# Patient Record
Sex: Male | Born: 1955
Health system: Southern US, Community
[De-identification: ages and names within clinical notes are randomized; demographics above are authoritative.]

## PROBLEM LIST (undated history)

## (undated) DIAGNOSIS — E785 Hyperlipidemia, unspecified: Secondary | ICD-10-CM

## (undated) DIAGNOSIS — I639 Cerebral infarction, unspecified: Secondary | ICD-10-CM

## (undated) DIAGNOSIS — J9602 Acute respiratory failure with hypercapnia: Secondary | ICD-10-CM

## (undated) DIAGNOSIS — E039 Hypothyroidism, unspecified: Secondary | ICD-10-CM

## (undated) DIAGNOSIS — F32A Depression, unspecified: Secondary | ICD-10-CM

## (undated) DIAGNOSIS — R1310 Dysphagia, unspecified: Secondary | ICD-10-CM

## (undated) DIAGNOSIS — Z95828 Presence of other vascular implants and grafts: Secondary | ICD-10-CM

## (undated) DIAGNOSIS — G9341 Metabolic encephalopathy: Secondary | ICD-10-CM

## (undated) DIAGNOSIS — N189 Chronic kidney disease, unspecified: Secondary | ICD-10-CM

## (undated) DIAGNOSIS — Z992 Dependence on renal dialysis: Secondary | ICD-10-CM

## (undated) DIAGNOSIS — K219 Gastro-esophageal reflux disease without esophagitis: Secondary | ICD-10-CM

## (undated) DIAGNOSIS — E079 Disorder of thyroid, unspecified: Secondary | ICD-10-CM

## (undated) DIAGNOSIS — D638 Anemia in other chronic diseases classified elsewhere: Secondary | ICD-10-CM

## (undated) DIAGNOSIS — K5732 Diverticulitis of large intestine without perforation or abscess without bleeding: Secondary | ICD-10-CM

## (undated) DIAGNOSIS — N186 End stage renal disease: Secondary | ICD-10-CM

## (undated) DIAGNOSIS — M5410 Radiculopathy, site unspecified: Secondary | ICD-10-CM

## (undated) DIAGNOSIS — E1122 Type 2 diabetes mellitus with diabetic chronic kidney disease: Secondary | ICD-10-CM

## (undated) DIAGNOSIS — I10 Essential (primary) hypertension: Secondary | ICD-10-CM

## (undated) DIAGNOSIS — E119 Type 2 diabetes mellitus without complications: Secondary | ICD-10-CM

## (undated) DIAGNOSIS — F419 Anxiety disorder, unspecified: Secondary | ICD-10-CM

## (undated) DIAGNOSIS — Z932 Ileostomy status: Secondary | ICD-10-CM

## (undated) DIAGNOSIS — Z86718 Personal history of other venous thrombosis and embolism: Secondary | ICD-10-CM

## (undated) HISTORY — DX: Personal history of other venous thrombosis and embolism: Z86.718

## (undated) HISTORY — DX: Radiculopathy, site unspecified: M54.10

## (undated) HISTORY — DX: Essential (primary) hypertension: I10

## (undated) HISTORY — DX: Presence of other vascular implants and grafts: Z95.828

## (undated) HISTORY — DX: Dependence on renal dialysis: Z99.2

## (undated) HISTORY — DX: Dysphagia, unspecified: R13.10

## (undated) HISTORY — DX: Ileostomy status: Z93.2

## (undated) HISTORY — DX: Cerebral infarction, unspecified: I63.9

## (undated) HISTORY — DX: End stage renal disease: N18.6

## (undated) HISTORY — PX: DIALYSIS/PERMA CATHETER REMOVAL: CATH118289

## (undated) HISTORY — PX: LAPAROSCOPIC INSERTION PERITONEAL CATHETER: SUR773

## (undated) HISTORY — DX: Anemia in other chronic diseases classified elsewhere: D63.8

## (undated) HISTORY — PX: EYE SURGERY: SHX253

## (undated) HISTORY — DX: Metabolic encephalopathy: G93.41

## (undated) HISTORY — PX: EXPLORATORY LAPAROTOMY: SUR591

## (undated) HISTORY — DX: Type 2 diabetes mellitus with diabetic chronic kidney disease: E11.22

## (undated) HISTORY — DX: Acute respiratory failure with hypercapnia: J96.02

---

## 2005-07-17 ENCOUNTER — Ambulatory Visit (HOSPITAL_COMMUNITY): Admission: RE | Admit: 2005-07-17 | Discharge: 2005-07-17 | Payer: Self-pay | Admitting: Ophthalmology

## 2005-10-15 ENCOUNTER — Ambulatory Visit (HOSPITAL_COMMUNITY): Admission: RE | Admit: 2005-10-15 | Discharge: 2005-10-15 | Payer: Self-pay | Admitting: Ophthalmology

## 2005-10-24 ENCOUNTER — Ambulatory Visit (HOSPITAL_COMMUNITY): Admission: RE | Admit: 2005-10-24 | Discharge: 2005-10-24 | Payer: Self-pay | Admitting: Ophthalmology

## 2005-10-27 ENCOUNTER — Emergency Department (HOSPITAL_COMMUNITY): Admission: EM | Admit: 2005-10-27 | Discharge: 2005-10-27 | Payer: Self-pay | Admitting: Emergency Medicine

## 2005-10-29 ENCOUNTER — Ambulatory Visit (HOSPITAL_COMMUNITY): Admission: RE | Admit: 2005-10-29 | Discharge: 2005-10-29 | Payer: Self-pay | Admitting: Ophthalmology

## 2006-01-30 ENCOUNTER — Ambulatory Visit (HOSPITAL_COMMUNITY): Admission: RE | Admit: 2006-01-30 | Discharge: 2006-01-30 | Payer: Self-pay | Admitting: Ophthalmology

## 2006-03-11 ENCOUNTER — Ambulatory Visit (HOSPITAL_COMMUNITY): Admission: RE | Admit: 2006-03-11 | Discharge: 2006-03-11 | Payer: Self-pay | Admitting: Ophthalmology

## 2007-05-09 IMAGING — CR DG CHEST 2V
2 series · 2 of 2 positions shown · non-contrast
Comparison: None available.

CLINICAL DATA: Preoperative respiratory exam for vitreal hemorrhage, cataract and diabetic retinal disease.  
 CHEST - 2 VIEW:

[view not recorded (1 of 2)]
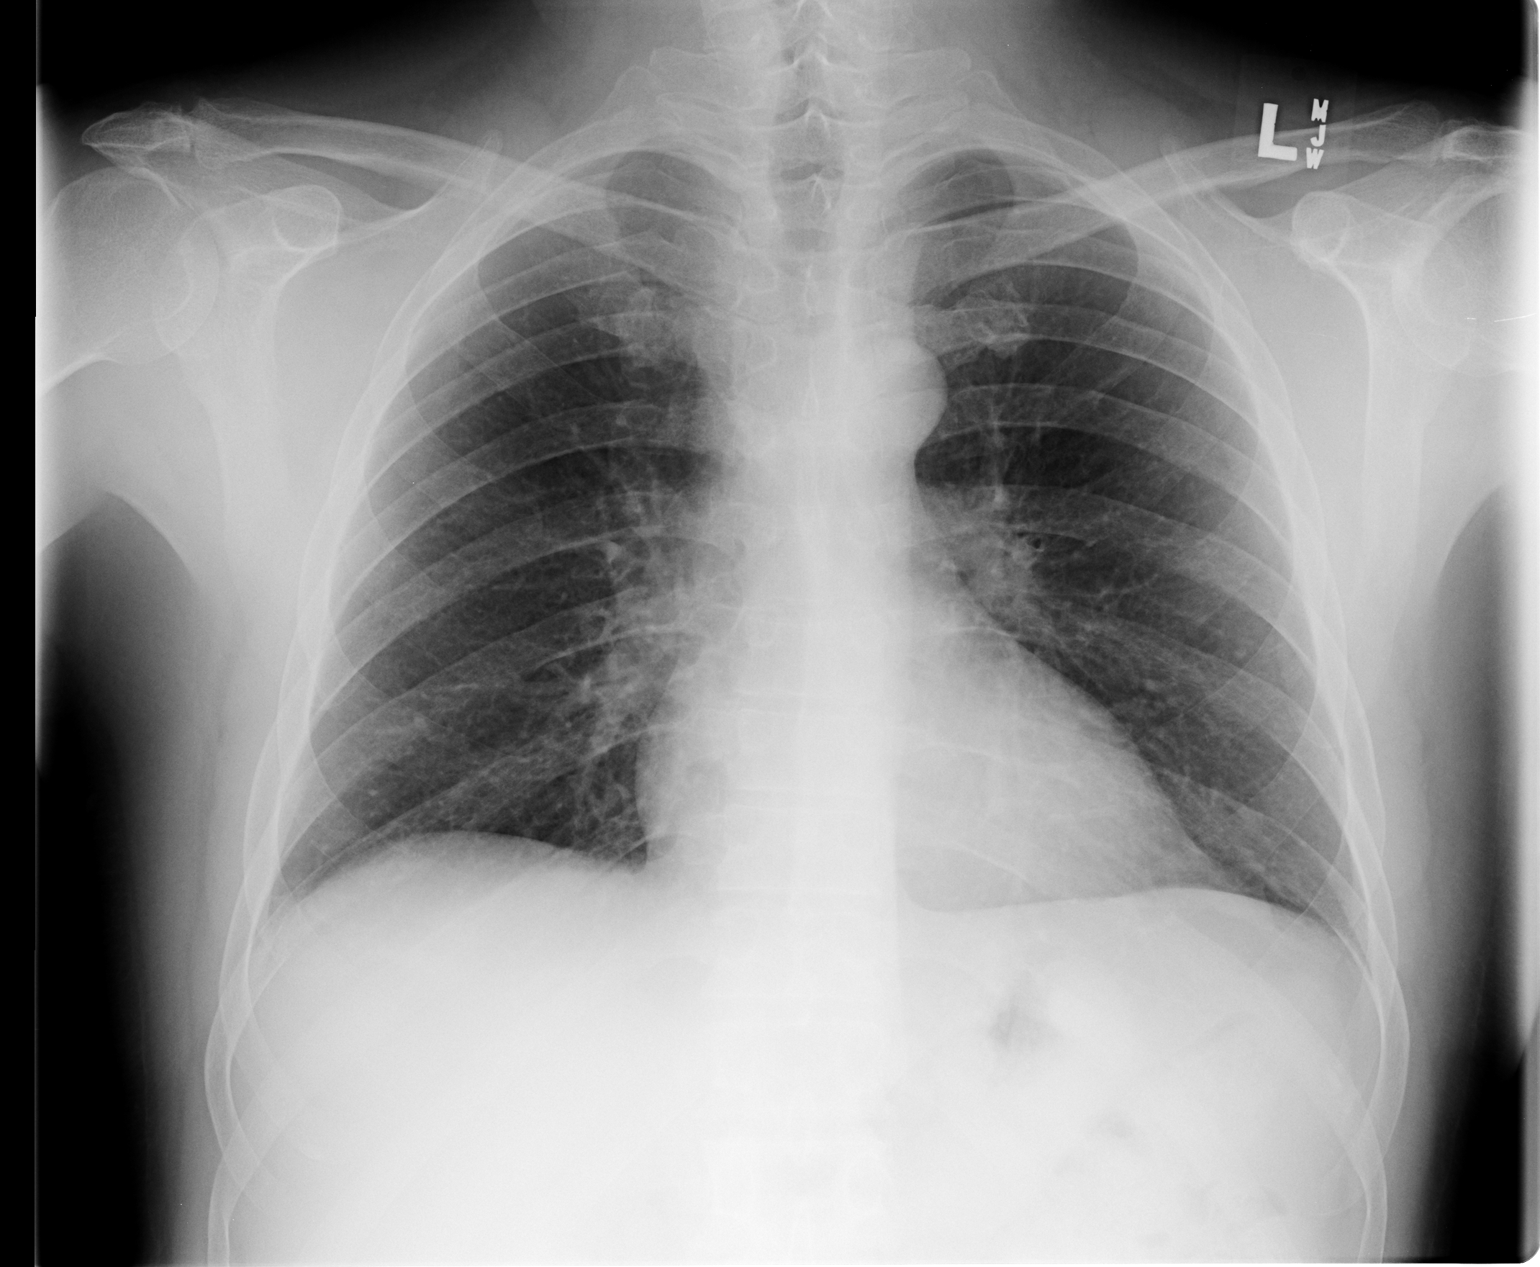

[view not recorded (2 of 2)]
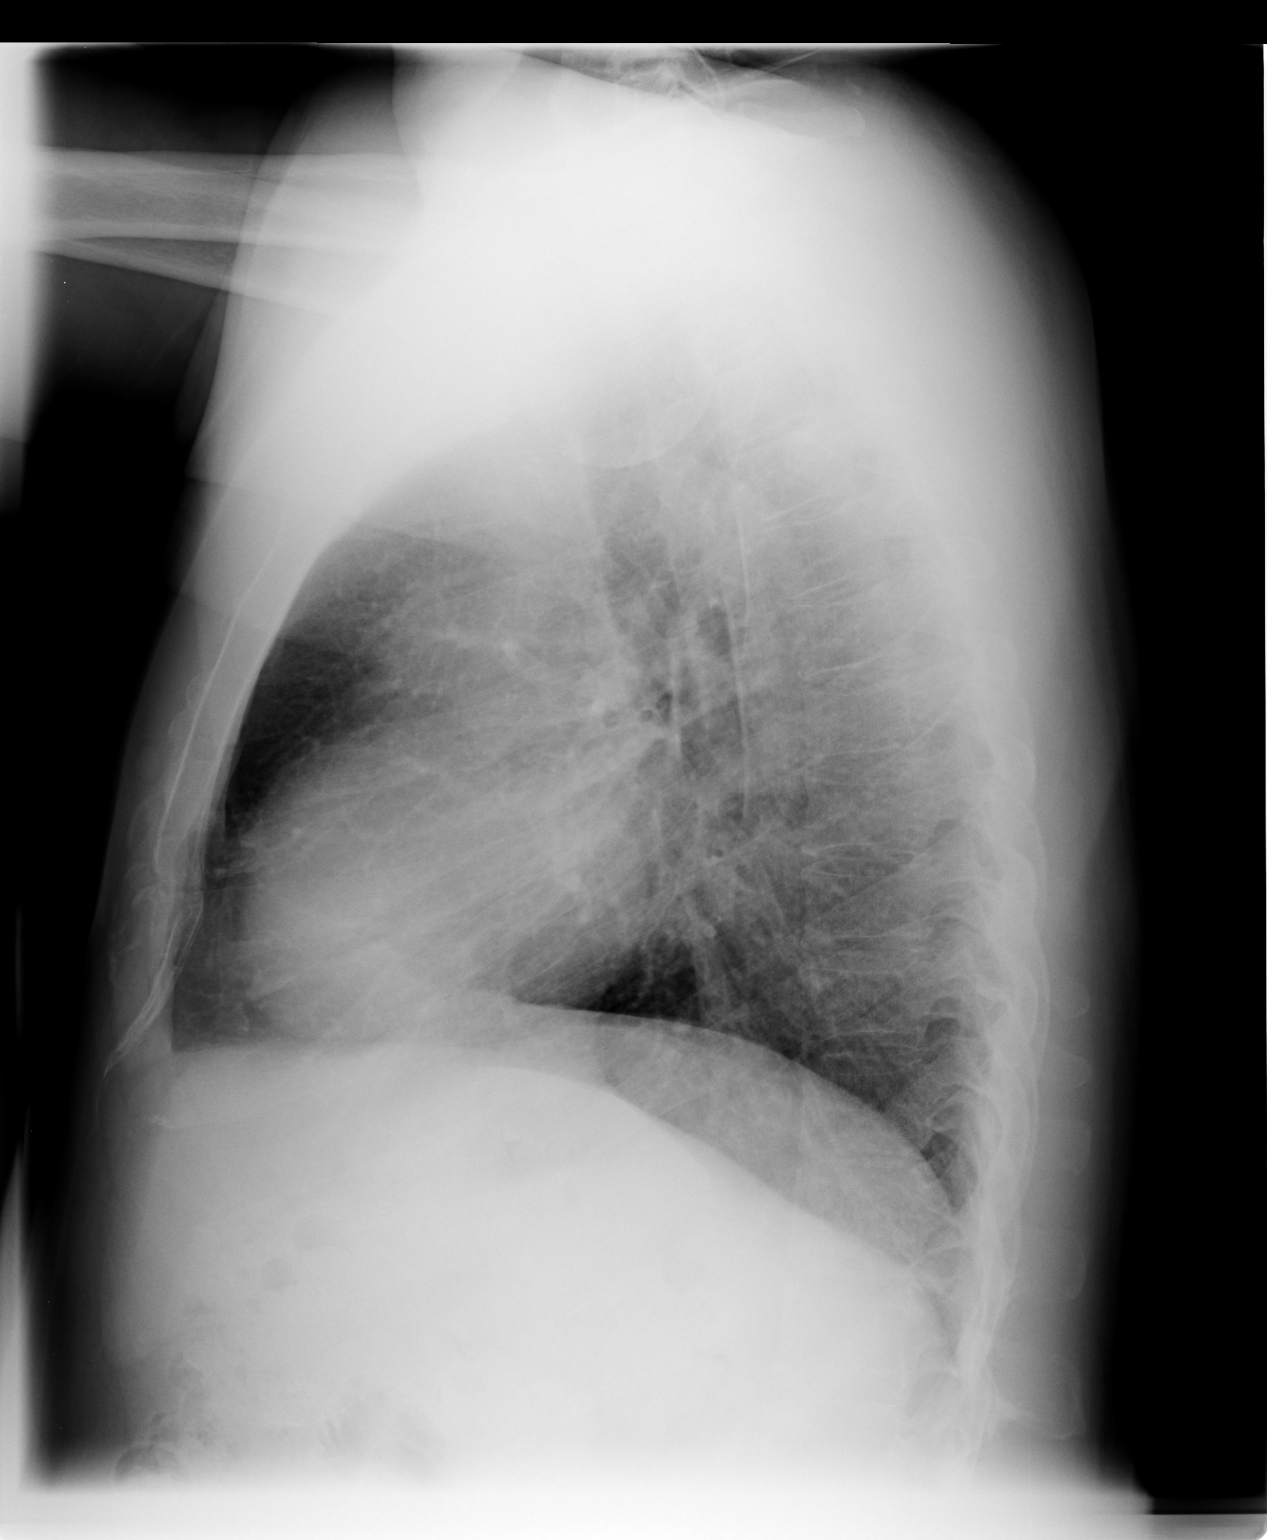

[2 of 2 positions shown; findings below may reference images not displayed]

FINDINGS: The heart size and mediastinal contours are within normal limits.  Both lungs are clear.  The visualized skeletal structures are unremarkable.
IMPRESSION: No active cardiopulmonary disease.

## 2015-07-12 DIAGNOSIS — Z01818 Encounter for other preprocedural examination: Secondary | ICD-10-CM | POA: Diagnosis not present

## 2015-07-12 DIAGNOSIS — N183 Chronic kidney disease, stage 3 (moderate): Secondary | ICD-10-CM | POA: Diagnosis not present

## 2015-07-15 DIAGNOSIS — Z794 Long term (current) use of insulin: Secondary | ICD-10-CM | POA: Diagnosis not present

## 2015-07-15 DIAGNOSIS — E8770 Fluid overload, unspecified: Secondary | ICD-10-CM | POA: Diagnosis not present

## 2015-07-15 DIAGNOSIS — I509 Heart failure, unspecified: Secondary | ICD-10-CM | POA: Diagnosis not present

## 2015-07-15 DIAGNOSIS — E7800 Pure hypercholesterolemia, unspecified: Secondary | ICD-10-CM | POA: Diagnosis not present

## 2015-07-15 DIAGNOSIS — Z87891 Personal history of nicotine dependence: Secondary | ICD-10-CM | POA: Diagnosis not present

## 2015-07-15 DIAGNOSIS — N186 End stage renal disease: Secondary | ICD-10-CM | POA: Diagnosis not present

## 2015-07-15 DIAGNOSIS — I120 Hypertensive chronic kidney disease with stage 5 chronic kidney disease or end stage renal disease: Secondary | ICD-10-CM | POA: Diagnosis not present

## 2015-07-15 DIAGNOSIS — Z79899 Other long term (current) drug therapy: Secondary | ICD-10-CM | POA: Diagnosis not present

## 2015-07-15 DIAGNOSIS — Z833 Family history of diabetes mellitus: Secondary | ICD-10-CM | POA: Diagnosis not present

## 2015-07-15 DIAGNOSIS — E1122 Type 2 diabetes mellitus with diabetic chronic kidney disease: Secondary | ICD-10-CM | POA: Diagnosis not present

## 2015-07-20 DIAGNOSIS — N186 End stage renal disease: Secondary | ICD-10-CM | POA: Diagnosis not present

## 2015-07-20 DIAGNOSIS — D649 Anemia, unspecified: Secondary | ICD-10-CM | POA: Diagnosis not present

## 2015-07-20 DIAGNOSIS — R75 Inconclusive laboratory evidence of human immunodeficiency virus [HIV]: Secondary | ICD-10-CM | POA: Diagnosis not present

## 2015-08-24 DIAGNOSIS — N186 End stage renal disease: Secondary | ICD-10-CM | POA: Diagnosis not present

## 2015-08-24 DIAGNOSIS — Z992 Dependence on renal dialysis: Secondary | ICD-10-CM | POA: Diagnosis not present

## 2015-09-23 DIAGNOSIS — Z992 Dependence on renal dialysis: Secondary | ICD-10-CM | POA: Diagnosis not present

## 2015-09-23 DIAGNOSIS — N186 End stage renal disease: Secondary | ICD-10-CM | POA: Diagnosis not present

## 2015-10-12 DIAGNOSIS — R079 Chest pain, unspecified: Secondary | ICD-10-CM | POA: Insufficient documentation

## 2015-10-24 DIAGNOSIS — N186 End stage renal disease: Secondary | ICD-10-CM | POA: Diagnosis not present

## 2015-10-24 DIAGNOSIS — Z992 Dependence on renal dialysis: Secondary | ICD-10-CM | POA: Diagnosis not present

## 2015-11-10 DIAGNOSIS — I10 Essential (primary) hypertension: Secondary | ICD-10-CM | POA: Diagnosis not present

## 2015-11-10 DIAGNOSIS — R079 Chest pain, unspecified: Secondary | ICD-10-CM | POA: Diagnosis not present

## 2015-11-23 DIAGNOSIS — R079 Chest pain, unspecified: Secondary | ICD-10-CM | POA: Diagnosis not present

## 2015-11-23 DIAGNOSIS — Z6828 Body mass index (BMI) 28.0-28.9, adult: Secondary | ICD-10-CM | POA: Diagnosis not present

## 2015-11-23 DIAGNOSIS — N186 End stage renal disease: Secondary | ICD-10-CM | POA: Diagnosis not present

## 2015-11-24 DIAGNOSIS — N186 End stage renal disease: Secondary | ICD-10-CM | POA: Diagnosis not present

## 2015-11-24 DIAGNOSIS — Z992 Dependence on renal dialysis: Secondary | ICD-10-CM | POA: Diagnosis not present

## 2015-12-01 DIAGNOSIS — E119 Type 2 diabetes mellitus without complications: Secondary | ICD-10-CM | POA: Diagnosis not present

## 2015-12-24 DIAGNOSIS — N186 End stage renal disease: Secondary | ICD-10-CM | POA: Diagnosis not present

## 2015-12-24 DIAGNOSIS — Z992 Dependence on renal dialysis: Secondary | ICD-10-CM | POA: Diagnosis not present

## 2015-12-29 DIAGNOSIS — E119 Type 2 diabetes mellitus without complications: Secondary | ICD-10-CM | POA: Diagnosis not present

## 2016-01-20 DIAGNOSIS — N186 End stage renal disease: Secondary | ICD-10-CM | POA: Diagnosis not present

## 2016-02-23 DIAGNOSIS — N186 End stage renal disease: Secondary | ICD-10-CM | POA: Diagnosis not present

## 2016-02-23 DIAGNOSIS — Z992 Dependence on renal dialysis: Secondary | ICD-10-CM | POA: Diagnosis not present

## 2016-03-07 DIAGNOSIS — M84372A Stress fracture, left ankle, initial encounter for fracture: Secondary | ICD-10-CM | POA: Diagnosis not present

## 2016-03-08 DIAGNOSIS — S92001A Unspecified fracture of right calcaneus, initial encounter for closed fracture: Secondary | ICD-10-CM | POA: Diagnosis not present

## 2016-03-15 DIAGNOSIS — S91001A Unspecified open wound, right ankle, initial encounter: Secondary | ICD-10-CM | POA: Diagnosis not present

## 2016-03-15 DIAGNOSIS — S92001A Unspecified fracture of right calcaneus, initial encounter for closed fracture: Secondary | ICD-10-CM | POA: Diagnosis not present

## 2016-03-25 DIAGNOSIS — N186 End stage renal disease: Secondary | ICD-10-CM | POA: Diagnosis not present

## 2016-03-25 DIAGNOSIS — Z992 Dependence on renal dialysis: Secondary | ICD-10-CM | POA: Diagnosis not present

## 2016-04-06 DIAGNOSIS — N186 End stage renal disease: Secondary | ICD-10-CM | POA: Diagnosis not present

## 2016-04-06 DIAGNOSIS — D631 Anemia in chronic kidney disease: Secondary | ICD-10-CM | POA: Diagnosis not present

## 2016-04-06 DIAGNOSIS — Z1159 Encounter for screening for other viral diseases: Secondary | ICD-10-CM | POA: Diagnosis not present

## 2016-04-06 DIAGNOSIS — E119 Type 2 diabetes mellitus without complications: Secondary | ICD-10-CM | POA: Diagnosis not present

## 2016-04-06 DIAGNOSIS — Z125 Encounter for screening for malignant neoplasm of prostate: Secondary | ICD-10-CM | POA: Diagnosis not present

## 2016-04-25 DIAGNOSIS — N186 End stage renal disease: Secondary | ICD-10-CM | POA: Diagnosis not present

## 2016-04-25 DIAGNOSIS — Z992 Dependence on renal dialysis: Secondary | ICD-10-CM | POA: Diagnosis not present

## 2016-04-27 DIAGNOSIS — H3581 Retinal edema: Secondary | ICD-10-CM | POA: Diagnosis not present

## 2016-05-23 DIAGNOSIS — N186 End stage renal disease: Secondary | ICD-10-CM | POA: Diagnosis not present

## 2016-05-23 DIAGNOSIS — Z992 Dependence on renal dialysis: Secondary | ICD-10-CM | POA: Diagnosis not present

## 2016-05-28 DIAGNOSIS — S92001A Unspecified fracture of right calcaneus, initial encounter for closed fracture: Secondary | ICD-10-CM | POA: Diagnosis not present

## 2016-06-23 DIAGNOSIS — N186 End stage renal disease: Secondary | ICD-10-CM | POA: Diagnosis not present

## 2016-06-23 DIAGNOSIS — Z992 Dependence on renal dialysis: Secondary | ICD-10-CM | POA: Diagnosis not present

## 2016-07-12 DIAGNOSIS — S92001A Unspecified fracture of right calcaneus, initial encounter for closed fracture: Secondary | ICD-10-CM | POA: Diagnosis not present

## 2016-07-23 DIAGNOSIS — Z992 Dependence on renal dialysis: Secondary | ICD-10-CM | POA: Diagnosis not present

## 2016-07-23 DIAGNOSIS — N186 End stage renal disease: Secondary | ICD-10-CM | POA: Diagnosis not present

## 2016-07-27 DIAGNOSIS — K5730 Diverticulosis of large intestine without perforation or abscess without bleeding: Secondary | ICD-10-CM | POA: Diagnosis not present

## 2016-07-27 DIAGNOSIS — Z1211 Encounter for screening for malignant neoplasm of colon: Secondary | ICD-10-CM | POA: Diagnosis not present

## 2016-07-27 DIAGNOSIS — K648 Other hemorrhoids: Secondary | ICD-10-CM | POA: Diagnosis not present

## 2016-07-27 DIAGNOSIS — D369 Benign neoplasm, unspecified site: Secondary | ICD-10-CM | POA: Diagnosis not present

## 2016-07-27 DIAGNOSIS — D123 Benign neoplasm of transverse colon: Secondary | ICD-10-CM | POA: Diagnosis not present

## 2016-07-27 DIAGNOSIS — R7309 Other abnormal glucose: Secondary | ICD-10-CM | POA: Diagnosis not present

## 2016-08-02 DIAGNOSIS — L308 Other specified dermatitis: Secondary | ICD-10-CM | POA: Diagnosis not present

## 2016-08-02 DIAGNOSIS — D485 Neoplasm of uncertain behavior of skin: Secondary | ICD-10-CM | POA: Diagnosis not present

## 2016-08-02 DIAGNOSIS — L309 Dermatitis, unspecified: Secondary | ICD-10-CM | POA: Diagnosis not present

## 2016-08-23 DIAGNOSIS — Z992 Dependence on renal dialysis: Secondary | ICD-10-CM | POA: Diagnosis not present

## 2016-08-23 DIAGNOSIS — N186 End stage renal disease: Secondary | ICD-10-CM | POA: Diagnosis not present

## 2016-12-28 DIAGNOSIS — I2510 Atherosclerotic heart disease of native coronary artery without angina pectoris: Secondary | ICD-10-CM | POA: Diagnosis not present

## 2016-12-28 DIAGNOSIS — Z01810 Encounter for preprocedural cardiovascular examination: Secondary | ICD-10-CM | POA: Diagnosis not present

## 2017-01-25 DIAGNOSIS — Z992 Dependence on renal dialysis: Secondary | ICD-10-CM | POA: Diagnosis not present

## 2017-02-08 DIAGNOSIS — K5730 Diverticulosis of large intestine without perforation or abscess without bleeding: Secondary | ICD-10-CM | POA: Diagnosis not present

## 2017-02-08 DIAGNOSIS — Z86010 Personal history of colonic polyps: Secondary | ICD-10-CM | POA: Diagnosis not present

## 2017-02-08 DIAGNOSIS — Z1211 Encounter for screening for malignant neoplasm of colon: Secondary | ICD-10-CM | POA: Diagnosis not present

## 2017-02-08 DIAGNOSIS — R7309 Other abnormal glucose: Secondary | ICD-10-CM | POA: Diagnosis not present

## 2017-02-08 DIAGNOSIS — K648 Other hemorrhoids: Secondary | ICD-10-CM | POA: Diagnosis not present

## 2017-04-18 DIAGNOSIS — Z91148 Patient's other noncompliance with medication regimen for other reason: Secondary | ICD-10-CM | POA: Insufficient documentation

## 2017-04-18 DIAGNOSIS — E782 Mixed hyperlipidemia: Secondary | ICD-10-CM | POA: Insufficient documentation

## 2017-04-18 DIAGNOSIS — Z794 Long term (current) use of insulin: Secondary | ICD-10-CM | POA: Insufficient documentation

## 2017-04-18 DIAGNOSIS — Z758 Other problems related to medical facilities and other health care: Secondary | ICD-10-CM | POA: Insufficient documentation

## 2017-04-18 DIAGNOSIS — N186 End stage renal disease: Secondary | ICD-10-CM | POA: Insufficient documentation

## 2017-04-18 DIAGNOSIS — I10 Essential (primary) hypertension: Secondary | ICD-10-CM | POA: Insufficient documentation

## 2017-04-22 DIAGNOSIS — R609 Edema, unspecified: Secondary | ICD-10-CM | POA: Diagnosis not present

## 2017-04-22 DIAGNOSIS — N186 End stage renal disease: Secondary | ICD-10-CM | POA: Diagnosis not present

## 2017-04-22 DIAGNOSIS — I10 Essential (primary) hypertension: Secondary | ICD-10-CM | POA: Diagnosis not present

## 2017-04-24 DIAGNOSIS — N189 Chronic kidney disease, unspecified: Secondary | ICD-10-CM | POA: Insufficient documentation

## 2017-05-14 DIAGNOSIS — N186 End stage renal disease: Secondary | ICD-10-CM | POA: Diagnosis not present

## 2017-05-15 DIAGNOSIS — N186 End stage renal disease: Secondary | ICD-10-CM | POA: Diagnosis not present

## 2017-05-16 DIAGNOSIS — N186 End stage renal disease: Secondary | ICD-10-CM | POA: Diagnosis not present

## 2017-05-17 DIAGNOSIS — N186 End stage renal disease: Secondary | ICD-10-CM | POA: Diagnosis not present

## 2017-05-18 DIAGNOSIS — N186 End stage renal disease: Secondary | ICD-10-CM | POA: Diagnosis not present

## 2017-05-19 DIAGNOSIS — N186 End stage renal disease: Secondary | ICD-10-CM | POA: Diagnosis not present

## 2017-05-20 DIAGNOSIS — N186 End stage renal disease: Secondary | ICD-10-CM | POA: Diagnosis not present

## 2017-05-21 DIAGNOSIS — N186 End stage renal disease: Secondary | ICD-10-CM | POA: Diagnosis not present

## 2017-05-22 DIAGNOSIS — N186 End stage renal disease: Secondary | ICD-10-CM | POA: Diagnosis not present

## 2017-05-23 DIAGNOSIS — N186 End stage renal disease: Secondary | ICD-10-CM | POA: Diagnosis not present

## 2017-05-24 DIAGNOSIS — D509 Iron deficiency anemia, unspecified: Secondary | ICD-10-CM | POA: Diagnosis not present

## 2017-05-24 DIAGNOSIS — N186 End stage renal disease: Secondary | ICD-10-CM | POA: Diagnosis not present

## 2017-05-24 DIAGNOSIS — Z23 Encounter for immunization: Secondary | ICD-10-CM | POA: Diagnosis not present

## 2017-05-24 DIAGNOSIS — N2581 Secondary hyperparathyroidism of renal origin: Secondary | ICD-10-CM | POA: Diagnosis not present

## 2017-05-25 DIAGNOSIS — N186 End stage renal disease: Secondary | ICD-10-CM | POA: Diagnosis not present

## 2017-05-25 DIAGNOSIS — D509 Iron deficiency anemia, unspecified: Secondary | ICD-10-CM | POA: Diagnosis not present

## 2017-05-25 DIAGNOSIS — N2581 Secondary hyperparathyroidism of renal origin: Secondary | ICD-10-CM | POA: Diagnosis not present

## 2017-05-25 DIAGNOSIS — Z23 Encounter for immunization: Secondary | ICD-10-CM | POA: Diagnosis not present

## 2017-05-26 DIAGNOSIS — N2581 Secondary hyperparathyroidism of renal origin: Secondary | ICD-10-CM | POA: Diagnosis not present

## 2017-05-26 DIAGNOSIS — N186 End stage renal disease: Secondary | ICD-10-CM | POA: Diagnosis not present

## 2017-05-26 DIAGNOSIS — Z23 Encounter for immunization: Secondary | ICD-10-CM | POA: Diagnosis not present

## 2017-05-26 DIAGNOSIS — D509 Iron deficiency anemia, unspecified: Secondary | ICD-10-CM | POA: Diagnosis not present

## 2017-05-27 DIAGNOSIS — Z23 Encounter for immunization: Secondary | ICD-10-CM | POA: Diagnosis not present

## 2017-05-27 DIAGNOSIS — D509 Iron deficiency anemia, unspecified: Secondary | ICD-10-CM | POA: Diagnosis not present

## 2017-05-27 DIAGNOSIS — N186 End stage renal disease: Secondary | ICD-10-CM | POA: Diagnosis not present

## 2017-05-27 DIAGNOSIS — N2581 Secondary hyperparathyroidism of renal origin: Secondary | ICD-10-CM | POA: Diagnosis not present

## 2017-05-28 DIAGNOSIS — N186 End stage renal disease: Secondary | ICD-10-CM | POA: Diagnosis not present

## 2017-05-28 DIAGNOSIS — N2581 Secondary hyperparathyroidism of renal origin: Secondary | ICD-10-CM | POA: Diagnosis not present

## 2017-05-28 DIAGNOSIS — D509 Iron deficiency anemia, unspecified: Secondary | ICD-10-CM | POA: Diagnosis not present

## 2017-05-28 DIAGNOSIS — Z23 Encounter for immunization: Secondary | ICD-10-CM | POA: Diagnosis not present

## 2017-05-29 DIAGNOSIS — N2581 Secondary hyperparathyroidism of renal origin: Secondary | ICD-10-CM | POA: Diagnosis not present

## 2017-05-29 DIAGNOSIS — N186 End stage renal disease: Secondary | ICD-10-CM | POA: Diagnosis not present

## 2017-05-29 DIAGNOSIS — D509 Iron deficiency anemia, unspecified: Secondary | ICD-10-CM | POA: Diagnosis not present

## 2017-05-29 DIAGNOSIS — Z23 Encounter for immunization: Secondary | ICD-10-CM | POA: Diagnosis not present

## 2017-05-30 DIAGNOSIS — N2581 Secondary hyperparathyroidism of renal origin: Secondary | ICD-10-CM | POA: Diagnosis not present

## 2017-05-30 DIAGNOSIS — D509 Iron deficiency anemia, unspecified: Secondary | ICD-10-CM | POA: Diagnosis not present

## 2017-05-30 DIAGNOSIS — Z23 Encounter for immunization: Secondary | ICD-10-CM | POA: Diagnosis not present

## 2017-05-30 DIAGNOSIS — E119 Type 2 diabetes mellitus without complications: Secondary | ICD-10-CM | POA: Diagnosis not present

## 2017-05-30 DIAGNOSIS — N186 End stage renal disease: Secondary | ICD-10-CM | POA: Diagnosis not present

## 2017-05-31 DIAGNOSIS — N186 End stage renal disease: Secondary | ICD-10-CM | POA: Diagnosis not present

## 2017-05-31 DIAGNOSIS — Z23 Encounter for immunization: Secondary | ICD-10-CM | POA: Diagnosis not present

## 2017-05-31 DIAGNOSIS — N2581 Secondary hyperparathyroidism of renal origin: Secondary | ICD-10-CM | POA: Diagnosis not present

## 2017-05-31 DIAGNOSIS — D509 Iron deficiency anemia, unspecified: Secondary | ICD-10-CM | POA: Diagnosis not present

## 2017-06-01 DIAGNOSIS — N2581 Secondary hyperparathyroidism of renal origin: Secondary | ICD-10-CM | POA: Diagnosis not present

## 2017-06-01 DIAGNOSIS — N186 End stage renal disease: Secondary | ICD-10-CM | POA: Diagnosis not present

## 2017-06-01 DIAGNOSIS — D509 Iron deficiency anemia, unspecified: Secondary | ICD-10-CM | POA: Diagnosis not present

## 2017-06-01 DIAGNOSIS — Z23 Encounter for immunization: Secondary | ICD-10-CM | POA: Diagnosis not present

## 2017-06-02 DIAGNOSIS — N186 End stage renal disease: Secondary | ICD-10-CM | POA: Diagnosis not present

## 2017-06-02 DIAGNOSIS — N2581 Secondary hyperparathyroidism of renal origin: Secondary | ICD-10-CM | POA: Diagnosis not present

## 2017-06-02 DIAGNOSIS — D509 Iron deficiency anemia, unspecified: Secondary | ICD-10-CM | POA: Diagnosis not present

## 2017-06-02 DIAGNOSIS — Z23 Encounter for immunization: Secondary | ICD-10-CM | POA: Diagnosis not present

## 2017-06-03 DIAGNOSIS — N186 End stage renal disease: Secondary | ICD-10-CM | POA: Diagnosis not present

## 2017-06-03 DIAGNOSIS — D509 Iron deficiency anemia, unspecified: Secondary | ICD-10-CM | POA: Diagnosis not present

## 2017-06-03 DIAGNOSIS — Z23 Encounter for immunization: Secondary | ICD-10-CM | POA: Diagnosis not present

## 2017-06-03 DIAGNOSIS — N2581 Secondary hyperparathyroidism of renal origin: Secondary | ICD-10-CM | POA: Diagnosis not present

## 2017-06-04 DIAGNOSIS — N186 End stage renal disease: Secondary | ICD-10-CM | POA: Diagnosis not present

## 2017-06-04 DIAGNOSIS — D509 Iron deficiency anemia, unspecified: Secondary | ICD-10-CM | POA: Diagnosis not present

## 2017-06-04 DIAGNOSIS — Z23 Encounter for immunization: Secondary | ICD-10-CM | POA: Diagnosis not present

## 2017-06-04 DIAGNOSIS — N2581 Secondary hyperparathyroidism of renal origin: Secondary | ICD-10-CM | POA: Diagnosis not present

## 2017-06-05 DIAGNOSIS — Z23 Encounter for immunization: Secondary | ICD-10-CM | POA: Diagnosis not present

## 2017-06-05 DIAGNOSIS — D509 Iron deficiency anemia, unspecified: Secondary | ICD-10-CM | POA: Diagnosis not present

## 2017-06-05 DIAGNOSIS — N2581 Secondary hyperparathyroidism of renal origin: Secondary | ICD-10-CM | POA: Diagnosis not present

## 2017-06-05 DIAGNOSIS — N186 End stage renal disease: Secondary | ICD-10-CM | POA: Diagnosis not present

## 2017-06-06 DIAGNOSIS — D509 Iron deficiency anemia, unspecified: Secondary | ICD-10-CM | POA: Diagnosis not present

## 2017-06-06 DIAGNOSIS — N2581 Secondary hyperparathyroidism of renal origin: Secondary | ICD-10-CM | POA: Diagnosis not present

## 2017-06-06 DIAGNOSIS — Z23 Encounter for immunization: Secondary | ICD-10-CM | POA: Diagnosis not present

## 2017-06-06 DIAGNOSIS — N186 End stage renal disease: Secondary | ICD-10-CM | POA: Diagnosis not present

## 2017-06-07 DIAGNOSIS — D509 Iron deficiency anemia, unspecified: Secondary | ICD-10-CM | POA: Diagnosis not present

## 2017-06-07 DIAGNOSIS — N186 End stage renal disease: Secondary | ICD-10-CM | POA: Diagnosis not present

## 2017-06-07 DIAGNOSIS — N2581 Secondary hyperparathyroidism of renal origin: Secondary | ICD-10-CM | POA: Diagnosis not present

## 2017-06-07 DIAGNOSIS — Z23 Encounter for immunization: Secondary | ICD-10-CM | POA: Diagnosis not present

## 2017-06-08 DIAGNOSIS — Z23 Encounter for immunization: Secondary | ICD-10-CM | POA: Diagnosis not present

## 2017-06-08 DIAGNOSIS — N2581 Secondary hyperparathyroidism of renal origin: Secondary | ICD-10-CM | POA: Diagnosis not present

## 2017-06-08 DIAGNOSIS — N186 End stage renal disease: Secondary | ICD-10-CM | POA: Diagnosis not present

## 2017-06-08 DIAGNOSIS — D509 Iron deficiency anemia, unspecified: Secondary | ICD-10-CM | POA: Diagnosis not present

## 2017-06-09 DIAGNOSIS — Z23 Encounter for immunization: Secondary | ICD-10-CM | POA: Diagnosis not present

## 2017-06-09 DIAGNOSIS — D509 Iron deficiency anemia, unspecified: Secondary | ICD-10-CM | POA: Diagnosis not present

## 2017-06-09 DIAGNOSIS — N186 End stage renal disease: Secondary | ICD-10-CM | POA: Diagnosis not present

## 2017-06-09 DIAGNOSIS — N2581 Secondary hyperparathyroidism of renal origin: Secondary | ICD-10-CM | POA: Diagnosis not present

## 2017-06-10 DIAGNOSIS — N186 End stage renal disease: Secondary | ICD-10-CM | POA: Diagnosis not present

## 2017-06-10 DIAGNOSIS — N2581 Secondary hyperparathyroidism of renal origin: Secondary | ICD-10-CM | POA: Diagnosis not present

## 2017-06-10 DIAGNOSIS — D509 Iron deficiency anemia, unspecified: Secondary | ICD-10-CM | POA: Diagnosis not present

## 2017-06-10 DIAGNOSIS — Z23 Encounter for immunization: Secondary | ICD-10-CM | POA: Diagnosis not present

## 2017-06-11 DIAGNOSIS — D509 Iron deficiency anemia, unspecified: Secondary | ICD-10-CM | POA: Diagnosis not present

## 2017-06-11 DIAGNOSIS — N186 End stage renal disease: Secondary | ICD-10-CM | POA: Diagnosis not present

## 2017-06-11 DIAGNOSIS — Z23 Encounter for immunization: Secondary | ICD-10-CM | POA: Diagnosis not present

## 2017-06-11 DIAGNOSIS — N2581 Secondary hyperparathyroidism of renal origin: Secondary | ICD-10-CM | POA: Diagnosis not present

## 2017-06-12 DIAGNOSIS — N186 End stage renal disease: Secondary | ICD-10-CM | POA: Diagnosis not present

## 2017-06-12 DIAGNOSIS — Z23 Encounter for immunization: Secondary | ICD-10-CM | POA: Diagnosis not present

## 2017-06-12 DIAGNOSIS — N2581 Secondary hyperparathyroidism of renal origin: Secondary | ICD-10-CM | POA: Diagnosis not present

## 2017-06-12 DIAGNOSIS — D509 Iron deficiency anemia, unspecified: Secondary | ICD-10-CM | POA: Diagnosis not present

## 2017-06-13 DIAGNOSIS — N186 End stage renal disease: Secondary | ICD-10-CM | POA: Diagnosis not present

## 2017-06-13 DIAGNOSIS — N2581 Secondary hyperparathyroidism of renal origin: Secondary | ICD-10-CM | POA: Diagnosis not present

## 2017-06-13 DIAGNOSIS — Z23 Encounter for immunization: Secondary | ICD-10-CM | POA: Diagnosis not present

## 2017-06-13 DIAGNOSIS — D509 Iron deficiency anemia, unspecified: Secondary | ICD-10-CM | POA: Diagnosis not present

## 2017-06-14 DIAGNOSIS — Z23 Encounter for immunization: Secondary | ICD-10-CM | POA: Diagnosis not present

## 2017-06-14 DIAGNOSIS — N186 End stage renal disease: Secondary | ICD-10-CM | POA: Diagnosis not present

## 2017-06-14 DIAGNOSIS — D509 Iron deficiency anemia, unspecified: Secondary | ICD-10-CM | POA: Diagnosis not present

## 2017-06-14 DIAGNOSIS — N2581 Secondary hyperparathyroidism of renal origin: Secondary | ICD-10-CM | POA: Diagnosis not present

## 2017-06-15 DIAGNOSIS — D509 Iron deficiency anemia, unspecified: Secondary | ICD-10-CM | POA: Diagnosis not present

## 2017-06-15 DIAGNOSIS — N2581 Secondary hyperparathyroidism of renal origin: Secondary | ICD-10-CM | POA: Diagnosis not present

## 2017-06-15 DIAGNOSIS — N186 End stage renal disease: Secondary | ICD-10-CM | POA: Diagnosis not present

## 2017-06-15 DIAGNOSIS — Z23 Encounter for immunization: Secondary | ICD-10-CM | POA: Diagnosis not present

## 2017-06-16 DIAGNOSIS — Z23 Encounter for immunization: Secondary | ICD-10-CM | POA: Diagnosis not present

## 2017-06-16 DIAGNOSIS — N2581 Secondary hyperparathyroidism of renal origin: Secondary | ICD-10-CM | POA: Diagnosis not present

## 2017-06-16 DIAGNOSIS — N186 End stage renal disease: Secondary | ICD-10-CM | POA: Diagnosis not present

## 2017-06-16 DIAGNOSIS — D509 Iron deficiency anemia, unspecified: Secondary | ICD-10-CM | POA: Diagnosis not present

## 2017-06-17 DIAGNOSIS — Z23 Encounter for immunization: Secondary | ICD-10-CM | POA: Diagnosis not present

## 2017-06-17 DIAGNOSIS — D509 Iron deficiency anemia, unspecified: Secondary | ICD-10-CM | POA: Diagnosis not present

## 2017-06-17 DIAGNOSIS — N2581 Secondary hyperparathyroidism of renal origin: Secondary | ICD-10-CM | POA: Diagnosis not present

## 2017-06-17 DIAGNOSIS — N186 End stage renal disease: Secondary | ICD-10-CM | POA: Diagnosis not present

## 2017-06-18 DIAGNOSIS — Z23 Encounter for immunization: Secondary | ICD-10-CM | POA: Diagnosis not present

## 2017-06-18 DIAGNOSIS — N186 End stage renal disease: Secondary | ICD-10-CM | POA: Diagnosis not present

## 2017-06-18 DIAGNOSIS — N2581 Secondary hyperparathyroidism of renal origin: Secondary | ICD-10-CM | POA: Diagnosis not present

## 2017-06-18 DIAGNOSIS — D509 Iron deficiency anemia, unspecified: Secondary | ICD-10-CM | POA: Diagnosis not present

## 2017-06-19 DIAGNOSIS — D509 Iron deficiency anemia, unspecified: Secondary | ICD-10-CM | POA: Diagnosis not present

## 2017-06-19 DIAGNOSIS — N186 End stage renal disease: Secondary | ICD-10-CM | POA: Diagnosis not present

## 2017-06-19 DIAGNOSIS — N2581 Secondary hyperparathyroidism of renal origin: Secondary | ICD-10-CM | POA: Diagnosis not present

## 2017-06-19 DIAGNOSIS — Z23 Encounter for immunization: Secondary | ICD-10-CM | POA: Diagnosis not present

## 2017-06-20 DIAGNOSIS — N186 End stage renal disease: Secondary | ICD-10-CM | POA: Diagnosis not present

## 2017-06-21 DIAGNOSIS — N2581 Secondary hyperparathyroidism of renal origin: Secondary | ICD-10-CM | POA: Diagnosis not present

## 2017-06-21 DIAGNOSIS — D509 Iron deficiency anemia, unspecified: Secondary | ICD-10-CM | POA: Diagnosis not present

## 2017-06-21 DIAGNOSIS — Z23 Encounter for immunization: Secondary | ICD-10-CM | POA: Diagnosis not present

## 2017-06-21 DIAGNOSIS — N186 End stage renal disease: Secondary | ICD-10-CM | POA: Diagnosis not present

## 2017-06-22 DIAGNOSIS — D509 Iron deficiency anemia, unspecified: Secondary | ICD-10-CM | POA: Diagnosis not present

## 2017-06-22 DIAGNOSIS — Z23 Encounter for immunization: Secondary | ICD-10-CM | POA: Diagnosis not present

## 2017-06-22 DIAGNOSIS — N186 End stage renal disease: Secondary | ICD-10-CM | POA: Diagnosis not present

## 2017-06-22 DIAGNOSIS — N2581 Secondary hyperparathyroidism of renal origin: Secondary | ICD-10-CM | POA: Diagnosis not present

## 2017-06-23 DIAGNOSIS — N2581 Secondary hyperparathyroidism of renal origin: Secondary | ICD-10-CM | POA: Diagnosis not present

## 2017-06-23 DIAGNOSIS — N186 End stage renal disease: Secondary | ICD-10-CM | POA: Diagnosis not present

## 2017-06-23 DIAGNOSIS — D509 Iron deficiency anemia, unspecified: Secondary | ICD-10-CM | POA: Diagnosis not present

## 2017-06-23 DIAGNOSIS — Z23 Encounter for immunization: Secondary | ICD-10-CM | POA: Diagnosis not present

## 2017-06-23 DIAGNOSIS — Z992 Dependence on renal dialysis: Secondary | ICD-10-CM | POA: Diagnosis not present

## 2017-06-24 DIAGNOSIS — D631 Anemia in chronic kidney disease: Secondary | ICD-10-CM | POA: Diagnosis not present

## 2017-06-24 DIAGNOSIS — N186 End stage renal disease: Secondary | ICD-10-CM | POA: Diagnosis not present

## 2017-06-24 DIAGNOSIS — D509 Iron deficiency anemia, unspecified: Secondary | ICD-10-CM | POA: Diagnosis not present

## 2017-06-24 DIAGNOSIS — N2581 Secondary hyperparathyroidism of renal origin: Secondary | ICD-10-CM | POA: Diagnosis not present

## 2017-06-25 DIAGNOSIS — N2581 Secondary hyperparathyroidism of renal origin: Secondary | ICD-10-CM | POA: Diagnosis not present

## 2017-06-25 DIAGNOSIS — D631 Anemia in chronic kidney disease: Secondary | ICD-10-CM | POA: Diagnosis not present

## 2017-06-25 DIAGNOSIS — N186 End stage renal disease: Secondary | ICD-10-CM | POA: Diagnosis not present

## 2017-06-25 DIAGNOSIS — D509 Iron deficiency anemia, unspecified: Secondary | ICD-10-CM | POA: Diagnosis not present

## 2017-06-26 DIAGNOSIS — N186 End stage renal disease: Secondary | ICD-10-CM | POA: Diagnosis not present

## 2017-06-26 DIAGNOSIS — N2581 Secondary hyperparathyroidism of renal origin: Secondary | ICD-10-CM | POA: Diagnosis not present

## 2017-06-26 DIAGNOSIS — D509 Iron deficiency anemia, unspecified: Secondary | ICD-10-CM | POA: Diagnosis not present

## 2017-06-26 DIAGNOSIS — D631 Anemia in chronic kidney disease: Secondary | ICD-10-CM | POA: Diagnosis not present

## 2017-06-27 DIAGNOSIS — N2581 Secondary hyperparathyroidism of renal origin: Secondary | ICD-10-CM | POA: Diagnosis not present

## 2017-06-27 DIAGNOSIS — D631 Anemia in chronic kidney disease: Secondary | ICD-10-CM | POA: Diagnosis not present

## 2017-06-27 DIAGNOSIS — D509 Iron deficiency anemia, unspecified: Secondary | ICD-10-CM | POA: Diagnosis not present

## 2017-06-27 DIAGNOSIS — N186 End stage renal disease: Secondary | ICD-10-CM | POA: Diagnosis not present

## 2017-06-28 DIAGNOSIS — D631 Anemia in chronic kidney disease: Secondary | ICD-10-CM | POA: Diagnosis not present

## 2017-06-28 DIAGNOSIS — N186 End stage renal disease: Secondary | ICD-10-CM | POA: Diagnosis not present

## 2017-06-28 DIAGNOSIS — E785 Hyperlipidemia, unspecified: Secondary | ICD-10-CM | POA: Diagnosis not present

## 2017-06-28 DIAGNOSIS — D509 Iron deficiency anemia, unspecified: Secondary | ICD-10-CM | POA: Diagnosis not present

## 2017-06-28 DIAGNOSIS — N2581 Secondary hyperparathyroidism of renal origin: Secondary | ICD-10-CM | POA: Diagnosis not present

## 2017-06-29 DIAGNOSIS — D509 Iron deficiency anemia, unspecified: Secondary | ICD-10-CM | POA: Diagnosis not present

## 2017-06-29 DIAGNOSIS — N186 End stage renal disease: Secondary | ICD-10-CM | POA: Diagnosis not present

## 2017-06-29 DIAGNOSIS — N2581 Secondary hyperparathyroidism of renal origin: Secondary | ICD-10-CM | POA: Diagnosis not present

## 2017-06-29 DIAGNOSIS — D631 Anemia in chronic kidney disease: Secondary | ICD-10-CM | POA: Diagnosis not present

## 2017-06-30 DIAGNOSIS — N186 End stage renal disease: Secondary | ICD-10-CM | POA: Diagnosis not present

## 2017-06-30 DIAGNOSIS — D631 Anemia in chronic kidney disease: Secondary | ICD-10-CM | POA: Diagnosis not present

## 2017-06-30 DIAGNOSIS — N2581 Secondary hyperparathyroidism of renal origin: Secondary | ICD-10-CM | POA: Diagnosis not present

## 2017-06-30 DIAGNOSIS — D509 Iron deficiency anemia, unspecified: Secondary | ICD-10-CM | POA: Diagnosis not present

## 2017-07-01 DIAGNOSIS — N2581 Secondary hyperparathyroidism of renal origin: Secondary | ICD-10-CM | POA: Diagnosis not present

## 2017-07-01 DIAGNOSIS — D509 Iron deficiency anemia, unspecified: Secondary | ICD-10-CM | POA: Diagnosis not present

## 2017-07-01 DIAGNOSIS — D631 Anemia in chronic kidney disease: Secondary | ICD-10-CM | POA: Diagnosis not present

## 2017-07-01 DIAGNOSIS — N186 End stage renal disease: Secondary | ICD-10-CM | POA: Diagnosis not present

## 2017-07-02 DIAGNOSIS — N2581 Secondary hyperparathyroidism of renal origin: Secondary | ICD-10-CM | POA: Diagnosis not present

## 2017-07-02 DIAGNOSIS — D631 Anemia in chronic kidney disease: Secondary | ICD-10-CM | POA: Diagnosis not present

## 2017-07-02 DIAGNOSIS — D509 Iron deficiency anemia, unspecified: Secondary | ICD-10-CM | POA: Diagnosis not present

## 2017-07-02 DIAGNOSIS — N186 End stage renal disease: Secondary | ICD-10-CM | POA: Diagnosis not present

## 2017-07-03 DIAGNOSIS — D509 Iron deficiency anemia, unspecified: Secondary | ICD-10-CM | POA: Diagnosis not present

## 2017-07-03 DIAGNOSIS — D631 Anemia in chronic kidney disease: Secondary | ICD-10-CM | POA: Diagnosis not present

## 2017-07-03 DIAGNOSIS — N186 End stage renal disease: Secondary | ICD-10-CM | POA: Diagnosis not present

## 2017-07-03 DIAGNOSIS — N2581 Secondary hyperparathyroidism of renal origin: Secondary | ICD-10-CM | POA: Diagnosis not present

## 2017-07-04 DIAGNOSIS — D509 Iron deficiency anemia, unspecified: Secondary | ICD-10-CM | POA: Diagnosis not present

## 2017-07-04 DIAGNOSIS — N2581 Secondary hyperparathyroidism of renal origin: Secondary | ICD-10-CM | POA: Diagnosis not present

## 2017-07-04 DIAGNOSIS — N186 End stage renal disease: Secondary | ICD-10-CM | POA: Diagnosis not present

## 2017-07-04 DIAGNOSIS — D631 Anemia in chronic kidney disease: Secondary | ICD-10-CM | POA: Diagnosis not present

## 2017-07-05 DIAGNOSIS — D509 Iron deficiency anemia, unspecified: Secondary | ICD-10-CM | POA: Diagnosis not present

## 2017-07-05 DIAGNOSIS — N2581 Secondary hyperparathyroidism of renal origin: Secondary | ICD-10-CM | POA: Diagnosis not present

## 2017-07-05 DIAGNOSIS — D631 Anemia in chronic kidney disease: Secondary | ICD-10-CM | POA: Diagnosis not present

## 2017-07-05 DIAGNOSIS — N186 End stage renal disease: Secondary | ICD-10-CM | POA: Diagnosis not present

## 2017-07-06 DIAGNOSIS — D509 Iron deficiency anemia, unspecified: Secondary | ICD-10-CM | POA: Diagnosis not present

## 2017-07-06 DIAGNOSIS — N2581 Secondary hyperparathyroidism of renal origin: Secondary | ICD-10-CM | POA: Diagnosis not present

## 2017-07-06 DIAGNOSIS — D631 Anemia in chronic kidney disease: Secondary | ICD-10-CM | POA: Diagnosis not present

## 2017-07-06 DIAGNOSIS — N186 End stage renal disease: Secondary | ICD-10-CM | POA: Diagnosis not present

## 2017-07-07 DIAGNOSIS — D509 Iron deficiency anemia, unspecified: Secondary | ICD-10-CM | POA: Diagnosis not present

## 2017-07-07 DIAGNOSIS — D631 Anemia in chronic kidney disease: Secondary | ICD-10-CM | POA: Diagnosis not present

## 2017-07-07 DIAGNOSIS — N2581 Secondary hyperparathyroidism of renal origin: Secondary | ICD-10-CM | POA: Diagnosis not present

## 2017-07-07 DIAGNOSIS — N186 End stage renal disease: Secondary | ICD-10-CM | POA: Diagnosis not present

## 2017-07-08 DIAGNOSIS — N186 End stage renal disease: Secondary | ICD-10-CM | POA: Diagnosis not present

## 2017-07-08 DIAGNOSIS — D509 Iron deficiency anemia, unspecified: Secondary | ICD-10-CM | POA: Diagnosis not present

## 2017-07-08 DIAGNOSIS — N2581 Secondary hyperparathyroidism of renal origin: Secondary | ICD-10-CM | POA: Diagnosis not present

## 2017-07-08 DIAGNOSIS — D631 Anemia in chronic kidney disease: Secondary | ICD-10-CM | POA: Diagnosis not present

## 2017-07-09 DIAGNOSIS — D631 Anemia in chronic kidney disease: Secondary | ICD-10-CM | POA: Diagnosis not present

## 2017-07-09 DIAGNOSIS — N186 End stage renal disease: Secondary | ICD-10-CM | POA: Diagnosis not present

## 2017-07-09 DIAGNOSIS — N2581 Secondary hyperparathyroidism of renal origin: Secondary | ICD-10-CM | POA: Diagnosis not present

## 2017-07-09 DIAGNOSIS — D509 Iron deficiency anemia, unspecified: Secondary | ICD-10-CM | POA: Diagnosis not present

## 2017-07-10 DIAGNOSIS — D631 Anemia in chronic kidney disease: Secondary | ICD-10-CM | POA: Diagnosis not present

## 2017-07-10 DIAGNOSIS — N186 End stage renal disease: Secondary | ICD-10-CM | POA: Diagnosis not present

## 2017-07-10 DIAGNOSIS — N2581 Secondary hyperparathyroidism of renal origin: Secondary | ICD-10-CM | POA: Diagnosis not present

## 2017-07-10 DIAGNOSIS — D509 Iron deficiency anemia, unspecified: Secondary | ICD-10-CM | POA: Diagnosis not present

## 2017-07-11 DIAGNOSIS — D509 Iron deficiency anemia, unspecified: Secondary | ICD-10-CM | POA: Diagnosis not present

## 2017-07-11 DIAGNOSIS — D631 Anemia in chronic kidney disease: Secondary | ICD-10-CM | POA: Diagnosis not present

## 2017-07-11 DIAGNOSIS — N2581 Secondary hyperparathyroidism of renal origin: Secondary | ICD-10-CM | POA: Diagnosis not present

## 2017-07-11 DIAGNOSIS — N186 End stage renal disease: Secondary | ICD-10-CM | POA: Diagnosis not present

## 2017-07-12 DIAGNOSIS — D509 Iron deficiency anemia, unspecified: Secondary | ICD-10-CM | POA: Diagnosis not present

## 2017-07-12 DIAGNOSIS — D631 Anemia in chronic kidney disease: Secondary | ICD-10-CM | POA: Diagnosis not present

## 2017-07-12 DIAGNOSIS — N186 End stage renal disease: Secondary | ICD-10-CM | POA: Diagnosis not present

## 2017-07-12 DIAGNOSIS — N2581 Secondary hyperparathyroidism of renal origin: Secondary | ICD-10-CM | POA: Diagnosis not present

## 2017-07-13 DIAGNOSIS — D509 Iron deficiency anemia, unspecified: Secondary | ICD-10-CM | POA: Diagnosis not present

## 2017-07-13 DIAGNOSIS — N2581 Secondary hyperparathyroidism of renal origin: Secondary | ICD-10-CM | POA: Diagnosis not present

## 2017-07-13 DIAGNOSIS — D631 Anemia in chronic kidney disease: Secondary | ICD-10-CM | POA: Diagnosis not present

## 2017-07-13 DIAGNOSIS — N186 End stage renal disease: Secondary | ICD-10-CM | POA: Diagnosis not present

## 2017-07-14 DIAGNOSIS — N186 End stage renal disease: Secondary | ICD-10-CM | POA: Diagnosis not present

## 2017-07-14 DIAGNOSIS — N2581 Secondary hyperparathyroidism of renal origin: Secondary | ICD-10-CM | POA: Diagnosis not present

## 2017-07-14 DIAGNOSIS — D509 Iron deficiency anemia, unspecified: Secondary | ICD-10-CM | POA: Diagnosis not present

## 2017-07-14 DIAGNOSIS — D631 Anemia in chronic kidney disease: Secondary | ICD-10-CM | POA: Diagnosis not present

## 2017-07-15 DIAGNOSIS — D509 Iron deficiency anemia, unspecified: Secondary | ICD-10-CM | POA: Diagnosis not present

## 2017-07-15 DIAGNOSIS — D631 Anemia in chronic kidney disease: Secondary | ICD-10-CM | POA: Diagnosis not present

## 2017-07-15 DIAGNOSIS — N2581 Secondary hyperparathyroidism of renal origin: Secondary | ICD-10-CM | POA: Diagnosis not present

## 2017-07-15 DIAGNOSIS — N186 End stage renal disease: Secondary | ICD-10-CM | POA: Diagnosis not present

## 2017-07-16 DIAGNOSIS — D509 Iron deficiency anemia, unspecified: Secondary | ICD-10-CM | POA: Diagnosis not present

## 2017-07-16 DIAGNOSIS — N186 End stage renal disease: Secondary | ICD-10-CM | POA: Diagnosis not present

## 2017-07-16 DIAGNOSIS — N2581 Secondary hyperparathyroidism of renal origin: Secondary | ICD-10-CM | POA: Diagnosis not present

## 2017-07-16 DIAGNOSIS — D631 Anemia in chronic kidney disease: Secondary | ICD-10-CM | POA: Diagnosis not present

## 2017-07-17 DIAGNOSIS — N2581 Secondary hyperparathyroidism of renal origin: Secondary | ICD-10-CM | POA: Diagnosis not present

## 2017-07-17 DIAGNOSIS — D509 Iron deficiency anemia, unspecified: Secondary | ICD-10-CM | POA: Diagnosis not present

## 2017-07-17 DIAGNOSIS — N186 End stage renal disease: Secondary | ICD-10-CM | POA: Diagnosis not present

## 2017-07-17 DIAGNOSIS — D631 Anemia in chronic kidney disease: Secondary | ICD-10-CM | POA: Diagnosis not present

## 2017-07-18 DIAGNOSIS — D509 Iron deficiency anemia, unspecified: Secondary | ICD-10-CM | POA: Diagnosis not present

## 2017-07-18 DIAGNOSIS — N2581 Secondary hyperparathyroidism of renal origin: Secondary | ICD-10-CM | POA: Diagnosis not present

## 2017-07-18 DIAGNOSIS — D631 Anemia in chronic kidney disease: Secondary | ICD-10-CM | POA: Diagnosis not present

## 2017-07-18 DIAGNOSIS — N186 End stage renal disease: Secondary | ICD-10-CM | POA: Diagnosis not present

## 2017-07-19 DIAGNOSIS — N2581 Secondary hyperparathyroidism of renal origin: Secondary | ICD-10-CM | POA: Diagnosis not present

## 2017-07-19 DIAGNOSIS — D631 Anemia in chronic kidney disease: Secondary | ICD-10-CM | POA: Diagnosis not present

## 2017-07-19 DIAGNOSIS — N186 End stage renal disease: Secondary | ICD-10-CM | POA: Diagnosis not present

## 2017-07-19 DIAGNOSIS — D509 Iron deficiency anemia, unspecified: Secondary | ICD-10-CM | POA: Diagnosis not present

## 2017-07-20 DIAGNOSIS — N186 End stage renal disease: Secondary | ICD-10-CM | POA: Diagnosis not present

## 2017-07-20 DIAGNOSIS — D509 Iron deficiency anemia, unspecified: Secondary | ICD-10-CM | POA: Diagnosis not present

## 2017-07-20 DIAGNOSIS — D631 Anemia in chronic kidney disease: Secondary | ICD-10-CM | POA: Diagnosis not present

## 2017-07-20 DIAGNOSIS — N2581 Secondary hyperparathyroidism of renal origin: Secondary | ICD-10-CM | POA: Diagnosis not present

## 2017-07-21 DIAGNOSIS — D509 Iron deficiency anemia, unspecified: Secondary | ICD-10-CM | POA: Diagnosis not present

## 2017-07-21 DIAGNOSIS — N2581 Secondary hyperparathyroidism of renal origin: Secondary | ICD-10-CM | POA: Diagnosis not present

## 2017-07-21 DIAGNOSIS — D631 Anemia in chronic kidney disease: Secondary | ICD-10-CM | POA: Diagnosis not present

## 2017-07-21 DIAGNOSIS — N186 End stage renal disease: Secondary | ICD-10-CM | POA: Diagnosis not present

## 2017-07-22 DIAGNOSIS — D509 Iron deficiency anemia, unspecified: Secondary | ICD-10-CM | POA: Diagnosis not present

## 2017-07-22 DIAGNOSIS — N2581 Secondary hyperparathyroidism of renal origin: Secondary | ICD-10-CM | POA: Diagnosis not present

## 2017-07-22 DIAGNOSIS — D631 Anemia in chronic kidney disease: Secondary | ICD-10-CM | POA: Diagnosis not present

## 2017-07-22 DIAGNOSIS — N186 End stage renal disease: Secondary | ICD-10-CM | POA: Diagnosis not present

## 2017-07-23 DIAGNOSIS — D631 Anemia in chronic kidney disease: Secondary | ICD-10-CM | POA: Diagnosis not present

## 2017-07-23 DIAGNOSIS — N186 End stage renal disease: Secondary | ICD-10-CM | POA: Diagnosis not present

## 2017-07-23 DIAGNOSIS — D509 Iron deficiency anemia, unspecified: Secondary | ICD-10-CM | POA: Diagnosis not present

## 2017-07-23 DIAGNOSIS — N2581 Secondary hyperparathyroidism of renal origin: Secondary | ICD-10-CM | POA: Diagnosis not present

## 2017-07-23 DIAGNOSIS — Z992 Dependence on renal dialysis: Secondary | ICD-10-CM | POA: Diagnosis not present

## 2017-07-24 DIAGNOSIS — N186 End stage renal disease: Secondary | ICD-10-CM | POA: Diagnosis not present

## 2017-07-24 DIAGNOSIS — N2581 Secondary hyperparathyroidism of renal origin: Secondary | ICD-10-CM | POA: Diagnosis not present

## 2017-07-24 DIAGNOSIS — D631 Anemia in chronic kidney disease: Secondary | ICD-10-CM | POA: Diagnosis not present

## 2017-07-25 DIAGNOSIS — D631 Anemia in chronic kidney disease: Secondary | ICD-10-CM | POA: Diagnosis not present

## 2017-07-25 DIAGNOSIS — N186 End stage renal disease: Secondary | ICD-10-CM | POA: Diagnosis not present

## 2017-07-25 DIAGNOSIS — N2581 Secondary hyperparathyroidism of renal origin: Secondary | ICD-10-CM | POA: Diagnosis not present

## 2017-07-26 DIAGNOSIS — Z1159 Encounter for screening for other viral diseases: Secondary | ICD-10-CM | POA: Diagnosis not present

## 2017-07-26 DIAGNOSIS — E1139 Type 2 diabetes mellitus with other diabetic ophthalmic complication: Secondary | ICD-10-CM | POA: Diagnosis not present

## 2017-07-26 DIAGNOSIS — N186 End stage renal disease: Secondary | ICD-10-CM | POA: Diagnosis not present

## 2017-07-26 DIAGNOSIS — Z114 Encounter for screening for human immunodeficiency virus [HIV]: Secondary | ICD-10-CM | POA: Diagnosis not present

## 2017-07-26 DIAGNOSIS — N2581 Secondary hyperparathyroidism of renal origin: Secondary | ICD-10-CM | POA: Diagnosis not present

## 2017-07-26 DIAGNOSIS — Z4932 Encounter for adequacy testing for peritoneal dialysis: Secondary | ICD-10-CM | POA: Diagnosis not present

## 2017-07-26 DIAGNOSIS — D631 Anemia in chronic kidney disease: Secondary | ICD-10-CM | POA: Diagnosis not present

## 2017-07-27 DIAGNOSIS — N2581 Secondary hyperparathyroidism of renal origin: Secondary | ICD-10-CM | POA: Diagnosis not present

## 2017-07-27 DIAGNOSIS — D631 Anemia in chronic kidney disease: Secondary | ICD-10-CM | POA: Diagnosis not present

## 2017-07-27 DIAGNOSIS — N186 End stage renal disease: Secondary | ICD-10-CM | POA: Diagnosis not present

## 2017-07-28 DIAGNOSIS — N186 End stage renal disease: Secondary | ICD-10-CM | POA: Diagnosis not present

## 2017-07-28 DIAGNOSIS — D631 Anemia in chronic kidney disease: Secondary | ICD-10-CM | POA: Diagnosis not present

## 2017-07-28 DIAGNOSIS — N2581 Secondary hyperparathyroidism of renal origin: Secondary | ICD-10-CM | POA: Diagnosis not present

## 2017-07-29 DIAGNOSIS — N186 End stage renal disease: Secondary | ICD-10-CM | POA: Diagnosis not present

## 2017-07-29 DIAGNOSIS — N2581 Secondary hyperparathyroidism of renal origin: Secondary | ICD-10-CM | POA: Diagnosis not present

## 2017-07-29 DIAGNOSIS — D631 Anemia in chronic kidney disease: Secondary | ICD-10-CM | POA: Diagnosis not present

## 2017-07-30 DIAGNOSIS — N186 End stage renal disease: Secondary | ICD-10-CM | POA: Diagnosis not present

## 2017-07-30 DIAGNOSIS — N2581 Secondary hyperparathyroidism of renal origin: Secondary | ICD-10-CM | POA: Diagnosis not present

## 2017-07-30 DIAGNOSIS — D631 Anemia in chronic kidney disease: Secondary | ICD-10-CM | POA: Diagnosis not present

## 2017-07-31 DIAGNOSIS — N2581 Secondary hyperparathyroidism of renal origin: Secondary | ICD-10-CM | POA: Diagnosis not present

## 2017-07-31 DIAGNOSIS — D631 Anemia in chronic kidney disease: Secondary | ICD-10-CM | POA: Diagnosis not present

## 2017-07-31 DIAGNOSIS — N186 End stage renal disease: Secondary | ICD-10-CM | POA: Diagnosis not present

## 2017-08-01 DIAGNOSIS — N2581 Secondary hyperparathyroidism of renal origin: Secondary | ICD-10-CM | POA: Diagnosis not present

## 2017-08-01 DIAGNOSIS — N186 End stage renal disease: Secondary | ICD-10-CM | POA: Diagnosis not present

## 2017-08-01 DIAGNOSIS — D631 Anemia in chronic kidney disease: Secondary | ICD-10-CM | POA: Diagnosis not present

## 2017-08-02 DIAGNOSIS — N2581 Secondary hyperparathyroidism of renal origin: Secondary | ICD-10-CM | POA: Diagnosis not present

## 2017-08-02 DIAGNOSIS — D631 Anemia in chronic kidney disease: Secondary | ICD-10-CM | POA: Diagnosis not present

## 2017-08-02 DIAGNOSIS — N186 End stage renal disease: Secondary | ICD-10-CM | POA: Diagnosis not present

## 2017-08-03 DIAGNOSIS — N186 End stage renal disease: Secondary | ICD-10-CM | POA: Diagnosis not present

## 2017-08-03 DIAGNOSIS — N2581 Secondary hyperparathyroidism of renal origin: Secondary | ICD-10-CM | POA: Diagnosis not present

## 2017-08-03 DIAGNOSIS — D631 Anemia in chronic kidney disease: Secondary | ICD-10-CM | POA: Diagnosis not present

## 2017-08-04 DIAGNOSIS — D631 Anemia in chronic kidney disease: Secondary | ICD-10-CM | POA: Diagnosis not present

## 2017-08-04 DIAGNOSIS — N186 End stage renal disease: Secondary | ICD-10-CM | POA: Diagnosis not present

## 2017-08-04 DIAGNOSIS — N2581 Secondary hyperparathyroidism of renal origin: Secondary | ICD-10-CM | POA: Diagnosis not present

## 2017-08-05 DIAGNOSIS — N186 End stage renal disease: Secondary | ICD-10-CM | POA: Diagnosis not present

## 2017-08-05 DIAGNOSIS — N2581 Secondary hyperparathyroidism of renal origin: Secondary | ICD-10-CM | POA: Diagnosis not present

## 2017-08-05 DIAGNOSIS — D631 Anemia in chronic kidney disease: Secondary | ICD-10-CM | POA: Diagnosis not present

## 2017-08-06 DIAGNOSIS — D631 Anemia in chronic kidney disease: Secondary | ICD-10-CM | POA: Diagnosis not present

## 2017-08-06 DIAGNOSIS — N186 End stage renal disease: Secondary | ICD-10-CM | POA: Diagnosis not present

## 2017-08-06 DIAGNOSIS — N2581 Secondary hyperparathyroidism of renal origin: Secondary | ICD-10-CM | POA: Diagnosis not present

## 2017-08-07 DIAGNOSIS — N186 End stage renal disease: Secondary | ICD-10-CM | POA: Diagnosis not present

## 2017-08-07 DIAGNOSIS — D631 Anemia in chronic kidney disease: Secondary | ICD-10-CM | POA: Diagnosis not present

## 2017-08-07 DIAGNOSIS — N2581 Secondary hyperparathyroidism of renal origin: Secondary | ICD-10-CM | POA: Diagnosis not present

## 2017-08-08 DIAGNOSIS — N186 End stage renal disease: Secondary | ICD-10-CM | POA: Diagnosis not present

## 2017-08-08 DIAGNOSIS — D631 Anemia in chronic kidney disease: Secondary | ICD-10-CM | POA: Diagnosis not present

## 2017-08-08 DIAGNOSIS — N2581 Secondary hyperparathyroidism of renal origin: Secondary | ICD-10-CM | POA: Diagnosis not present

## 2017-08-09 DIAGNOSIS — N186 End stage renal disease: Secondary | ICD-10-CM | POA: Diagnosis not present

## 2017-08-09 DIAGNOSIS — D631 Anemia in chronic kidney disease: Secondary | ICD-10-CM | POA: Diagnosis not present

## 2017-08-09 DIAGNOSIS — N2581 Secondary hyperparathyroidism of renal origin: Secondary | ICD-10-CM | POA: Diagnosis not present

## 2017-08-10 DIAGNOSIS — D631 Anemia in chronic kidney disease: Secondary | ICD-10-CM | POA: Diagnosis not present

## 2017-08-10 DIAGNOSIS — N186 End stage renal disease: Secondary | ICD-10-CM | POA: Diagnosis not present

## 2017-08-10 DIAGNOSIS — N2581 Secondary hyperparathyroidism of renal origin: Secondary | ICD-10-CM | POA: Diagnosis not present

## 2017-08-11 DIAGNOSIS — N2581 Secondary hyperparathyroidism of renal origin: Secondary | ICD-10-CM | POA: Diagnosis not present

## 2017-08-11 DIAGNOSIS — N186 End stage renal disease: Secondary | ICD-10-CM | POA: Diagnosis not present

## 2017-08-11 DIAGNOSIS — D631 Anemia in chronic kidney disease: Secondary | ICD-10-CM | POA: Diagnosis not present

## 2017-08-12 DIAGNOSIS — N186 End stage renal disease: Secondary | ICD-10-CM | POA: Diagnosis not present

## 2017-08-12 DIAGNOSIS — N2581 Secondary hyperparathyroidism of renal origin: Secondary | ICD-10-CM | POA: Diagnosis not present

## 2017-08-12 DIAGNOSIS — D631 Anemia in chronic kidney disease: Secondary | ICD-10-CM | POA: Diagnosis not present

## 2017-08-13 DIAGNOSIS — N2581 Secondary hyperparathyroidism of renal origin: Secondary | ICD-10-CM | POA: Diagnosis not present

## 2017-08-13 DIAGNOSIS — N186 End stage renal disease: Secondary | ICD-10-CM | POA: Diagnosis not present

## 2017-08-13 DIAGNOSIS — D631 Anemia in chronic kidney disease: Secondary | ICD-10-CM | POA: Diagnosis not present

## 2017-08-14 DIAGNOSIS — D631 Anemia in chronic kidney disease: Secondary | ICD-10-CM | POA: Diagnosis not present

## 2017-08-14 DIAGNOSIS — N2581 Secondary hyperparathyroidism of renal origin: Secondary | ICD-10-CM | POA: Diagnosis not present

## 2017-08-14 DIAGNOSIS — N186 End stage renal disease: Secondary | ICD-10-CM | POA: Diagnosis not present

## 2017-08-15 DIAGNOSIS — D631 Anemia in chronic kidney disease: Secondary | ICD-10-CM | POA: Diagnosis not present

## 2017-08-15 DIAGNOSIS — N186 End stage renal disease: Secondary | ICD-10-CM | POA: Diagnosis not present

## 2017-08-15 DIAGNOSIS — N2581 Secondary hyperparathyroidism of renal origin: Secondary | ICD-10-CM | POA: Diagnosis not present

## 2017-08-16 DIAGNOSIS — D631 Anemia in chronic kidney disease: Secondary | ICD-10-CM | POA: Diagnosis not present

## 2017-08-16 DIAGNOSIS — N186 End stage renal disease: Secondary | ICD-10-CM | POA: Diagnosis not present

## 2017-08-16 DIAGNOSIS — N2581 Secondary hyperparathyroidism of renal origin: Secondary | ICD-10-CM | POA: Diagnosis not present

## 2017-08-17 DIAGNOSIS — N2581 Secondary hyperparathyroidism of renal origin: Secondary | ICD-10-CM | POA: Diagnosis not present

## 2017-08-17 DIAGNOSIS — N186 End stage renal disease: Secondary | ICD-10-CM | POA: Diagnosis not present

## 2017-08-17 DIAGNOSIS — D631 Anemia in chronic kidney disease: Secondary | ICD-10-CM | POA: Diagnosis not present

## 2017-08-18 DIAGNOSIS — N186 End stage renal disease: Secondary | ICD-10-CM | POA: Diagnosis not present

## 2017-08-18 DIAGNOSIS — D631 Anemia in chronic kidney disease: Secondary | ICD-10-CM | POA: Diagnosis not present

## 2017-08-18 DIAGNOSIS — N2581 Secondary hyperparathyroidism of renal origin: Secondary | ICD-10-CM | POA: Diagnosis not present

## 2017-08-19 DIAGNOSIS — N186 End stage renal disease: Secondary | ICD-10-CM | POA: Diagnosis not present

## 2017-08-19 DIAGNOSIS — D631 Anemia in chronic kidney disease: Secondary | ICD-10-CM | POA: Diagnosis not present

## 2017-08-19 DIAGNOSIS — N2581 Secondary hyperparathyroidism of renal origin: Secondary | ICD-10-CM | POA: Diagnosis not present

## 2017-08-20 DIAGNOSIS — D631 Anemia in chronic kidney disease: Secondary | ICD-10-CM | POA: Diagnosis not present

## 2017-08-20 DIAGNOSIS — N186 End stage renal disease: Secondary | ICD-10-CM | POA: Diagnosis not present

## 2017-08-20 DIAGNOSIS — N2581 Secondary hyperparathyroidism of renal origin: Secondary | ICD-10-CM | POA: Diagnosis not present

## 2017-08-21 DIAGNOSIS — N2581 Secondary hyperparathyroidism of renal origin: Secondary | ICD-10-CM | POA: Diagnosis not present

## 2017-08-21 DIAGNOSIS — N186 End stage renal disease: Secondary | ICD-10-CM | POA: Diagnosis not present

## 2017-08-21 DIAGNOSIS — D631 Anemia in chronic kidney disease: Secondary | ICD-10-CM | POA: Diagnosis not present

## 2017-08-22 DIAGNOSIS — N186 End stage renal disease: Secondary | ICD-10-CM | POA: Diagnosis not present

## 2017-08-22 DIAGNOSIS — D631 Anemia in chronic kidney disease: Secondary | ICD-10-CM | POA: Diagnosis not present

## 2017-08-22 DIAGNOSIS — N2581 Secondary hyperparathyroidism of renal origin: Secondary | ICD-10-CM | POA: Diagnosis not present

## 2017-08-23 DIAGNOSIS — N2581 Secondary hyperparathyroidism of renal origin: Secondary | ICD-10-CM | POA: Diagnosis not present

## 2017-08-23 DIAGNOSIS — Z992 Dependence on renal dialysis: Secondary | ICD-10-CM | POA: Diagnosis not present

## 2017-08-23 DIAGNOSIS — D631 Anemia in chronic kidney disease: Secondary | ICD-10-CM | POA: Diagnosis not present

## 2017-08-23 DIAGNOSIS — N186 End stage renal disease: Secondary | ICD-10-CM | POA: Diagnosis not present

## 2017-08-24 DIAGNOSIS — D631 Anemia in chronic kidney disease: Secondary | ICD-10-CM | POA: Diagnosis not present

## 2017-08-24 DIAGNOSIS — N2581 Secondary hyperparathyroidism of renal origin: Secondary | ICD-10-CM | POA: Diagnosis not present

## 2017-08-24 DIAGNOSIS — N186 End stage renal disease: Secondary | ICD-10-CM | POA: Diagnosis not present

## 2017-08-24 DIAGNOSIS — D509 Iron deficiency anemia, unspecified: Secondary | ICD-10-CM | POA: Diagnosis not present

## 2017-08-25 DIAGNOSIS — D509 Iron deficiency anemia, unspecified: Secondary | ICD-10-CM | POA: Diagnosis not present

## 2017-08-25 DIAGNOSIS — D631 Anemia in chronic kidney disease: Secondary | ICD-10-CM | POA: Diagnosis not present

## 2017-08-25 DIAGNOSIS — N2581 Secondary hyperparathyroidism of renal origin: Secondary | ICD-10-CM | POA: Diagnosis not present

## 2017-08-25 DIAGNOSIS — N186 End stage renal disease: Secondary | ICD-10-CM | POA: Diagnosis not present

## 2017-08-26 DIAGNOSIS — N186 End stage renal disease: Secondary | ICD-10-CM | POA: Diagnosis not present

## 2017-08-26 DIAGNOSIS — D631 Anemia in chronic kidney disease: Secondary | ICD-10-CM | POA: Diagnosis not present

## 2017-08-26 DIAGNOSIS — N2581 Secondary hyperparathyroidism of renal origin: Secondary | ICD-10-CM | POA: Diagnosis not present

## 2017-08-26 DIAGNOSIS — D509 Iron deficiency anemia, unspecified: Secondary | ICD-10-CM | POA: Diagnosis not present

## 2017-08-27 DIAGNOSIS — D509 Iron deficiency anemia, unspecified: Secondary | ICD-10-CM | POA: Diagnosis not present

## 2017-08-27 DIAGNOSIS — N2581 Secondary hyperparathyroidism of renal origin: Secondary | ICD-10-CM | POA: Diagnosis not present

## 2017-08-27 DIAGNOSIS — D631 Anemia in chronic kidney disease: Secondary | ICD-10-CM | POA: Diagnosis not present

## 2017-08-27 DIAGNOSIS — N186 End stage renal disease: Secondary | ICD-10-CM | POA: Diagnosis not present

## 2017-08-28 DIAGNOSIS — N186 End stage renal disease: Secondary | ICD-10-CM | POA: Diagnosis not present

## 2017-08-28 DIAGNOSIS — D509 Iron deficiency anemia, unspecified: Secondary | ICD-10-CM | POA: Diagnosis not present

## 2017-08-28 DIAGNOSIS — D631 Anemia in chronic kidney disease: Secondary | ICD-10-CM | POA: Diagnosis not present

## 2017-08-28 DIAGNOSIS — N2581 Secondary hyperparathyroidism of renal origin: Secondary | ICD-10-CM | POA: Diagnosis not present

## 2017-08-29 DIAGNOSIS — D509 Iron deficiency anemia, unspecified: Secondary | ICD-10-CM | POA: Diagnosis not present

## 2017-08-29 DIAGNOSIS — D631 Anemia in chronic kidney disease: Secondary | ICD-10-CM | POA: Diagnosis not present

## 2017-08-29 DIAGNOSIS — N186 End stage renal disease: Secondary | ICD-10-CM | POA: Diagnosis not present

## 2017-08-29 DIAGNOSIS — N2581 Secondary hyperparathyroidism of renal origin: Secondary | ICD-10-CM | POA: Diagnosis not present

## 2017-08-30 DIAGNOSIS — E119 Type 2 diabetes mellitus without complications: Secondary | ICD-10-CM | POA: Diagnosis not present

## 2017-08-30 DIAGNOSIS — D631 Anemia in chronic kidney disease: Secondary | ICD-10-CM | POA: Diagnosis not present

## 2017-08-30 DIAGNOSIS — N2581 Secondary hyperparathyroidism of renal origin: Secondary | ICD-10-CM | POA: Diagnosis not present

## 2017-08-30 DIAGNOSIS — N186 End stage renal disease: Secondary | ICD-10-CM | POA: Diagnosis not present

## 2017-08-30 DIAGNOSIS — D509 Iron deficiency anemia, unspecified: Secondary | ICD-10-CM | POA: Diagnosis not present

## 2017-08-31 DIAGNOSIS — N186 End stage renal disease: Secondary | ICD-10-CM | POA: Diagnosis not present

## 2017-08-31 DIAGNOSIS — D631 Anemia in chronic kidney disease: Secondary | ICD-10-CM | POA: Diagnosis not present

## 2017-08-31 DIAGNOSIS — N2581 Secondary hyperparathyroidism of renal origin: Secondary | ICD-10-CM | POA: Diagnosis not present

## 2017-08-31 DIAGNOSIS — D509 Iron deficiency anemia, unspecified: Secondary | ICD-10-CM | POA: Diagnosis not present

## 2017-09-01 DIAGNOSIS — N2581 Secondary hyperparathyroidism of renal origin: Secondary | ICD-10-CM | POA: Diagnosis not present

## 2017-09-01 DIAGNOSIS — N186 End stage renal disease: Secondary | ICD-10-CM | POA: Diagnosis not present

## 2017-09-01 DIAGNOSIS — D509 Iron deficiency anemia, unspecified: Secondary | ICD-10-CM | POA: Diagnosis not present

## 2017-09-01 DIAGNOSIS — D631 Anemia in chronic kidney disease: Secondary | ICD-10-CM | POA: Diagnosis not present

## 2017-09-02 DIAGNOSIS — D631 Anemia in chronic kidney disease: Secondary | ICD-10-CM | POA: Diagnosis not present

## 2017-09-02 DIAGNOSIS — N186 End stage renal disease: Secondary | ICD-10-CM | POA: Diagnosis not present

## 2017-09-02 DIAGNOSIS — N2581 Secondary hyperparathyroidism of renal origin: Secondary | ICD-10-CM | POA: Diagnosis not present

## 2017-09-02 DIAGNOSIS — D509 Iron deficiency anemia, unspecified: Secondary | ICD-10-CM | POA: Diagnosis not present

## 2017-09-03 DIAGNOSIS — D631 Anemia in chronic kidney disease: Secondary | ICD-10-CM | POA: Diagnosis not present

## 2017-09-03 DIAGNOSIS — N2581 Secondary hyperparathyroidism of renal origin: Secondary | ICD-10-CM | POA: Diagnosis not present

## 2017-09-03 DIAGNOSIS — D509 Iron deficiency anemia, unspecified: Secondary | ICD-10-CM | POA: Diagnosis not present

## 2017-09-03 DIAGNOSIS — N186 End stage renal disease: Secondary | ICD-10-CM | POA: Diagnosis not present

## 2017-09-04 DIAGNOSIS — D631 Anemia in chronic kidney disease: Secondary | ICD-10-CM | POA: Diagnosis not present

## 2017-09-04 DIAGNOSIS — N2581 Secondary hyperparathyroidism of renal origin: Secondary | ICD-10-CM | POA: Diagnosis not present

## 2017-09-04 DIAGNOSIS — N186 End stage renal disease: Secondary | ICD-10-CM | POA: Diagnosis not present

## 2017-09-04 DIAGNOSIS — D509 Iron deficiency anemia, unspecified: Secondary | ICD-10-CM | POA: Diagnosis not present

## 2017-09-05 DIAGNOSIS — N186 End stage renal disease: Secondary | ICD-10-CM | POA: Diagnosis not present

## 2017-09-05 DIAGNOSIS — N2581 Secondary hyperparathyroidism of renal origin: Secondary | ICD-10-CM | POA: Diagnosis not present

## 2017-09-05 DIAGNOSIS — D509 Iron deficiency anemia, unspecified: Secondary | ICD-10-CM | POA: Diagnosis not present

## 2017-09-05 DIAGNOSIS — D631 Anemia in chronic kidney disease: Secondary | ICD-10-CM | POA: Diagnosis not present

## 2017-09-06 DIAGNOSIS — N186 End stage renal disease: Secondary | ICD-10-CM | POA: Diagnosis not present

## 2017-09-06 DIAGNOSIS — D631 Anemia in chronic kidney disease: Secondary | ICD-10-CM | POA: Diagnosis not present

## 2017-09-06 DIAGNOSIS — D509 Iron deficiency anemia, unspecified: Secondary | ICD-10-CM | POA: Diagnosis not present

## 2017-09-06 DIAGNOSIS — N2581 Secondary hyperparathyroidism of renal origin: Secondary | ICD-10-CM | POA: Diagnosis not present

## 2017-09-07 DIAGNOSIS — D509 Iron deficiency anemia, unspecified: Secondary | ICD-10-CM | POA: Diagnosis not present

## 2017-09-07 DIAGNOSIS — N2581 Secondary hyperparathyroidism of renal origin: Secondary | ICD-10-CM | POA: Diagnosis not present

## 2017-09-07 DIAGNOSIS — D631 Anemia in chronic kidney disease: Secondary | ICD-10-CM | POA: Diagnosis not present

## 2017-09-07 DIAGNOSIS — N186 End stage renal disease: Secondary | ICD-10-CM | POA: Diagnosis not present

## 2017-09-08 DIAGNOSIS — N186 End stage renal disease: Secondary | ICD-10-CM | POA: Diagnosis not present

## 2017-09-08 DIAGNOSIS — N2581 Secondary hyperparathyroidism of renal origin: Secondary | ICD-10-CM | POA: Diagnosis not present

## 2017-09-08 DIAGNOSIS — D631 Anemia in chronic kidney disease: Secondary | ICD-10-CM | POA: Diagnosis not present

## 2017-09-08 DIAGNOSIS — D509 Iron deficiency anemia, unspecified: Secondary | ICD-10-CM | POA: Diagnosis not present

## 2017-09-09 DIAGNOSIS — N2581 Secondary hyperparathyroidism of renal origin: Secondary | ICD-10-CM | POA: Diagnosis not present

## 2017-09-09 DIAGNOSIS — N186 End stage renal disease: Secondary | ICD-10-CM | POA: Diagnosis not present

## 2017-09-09 DIAGNOSIS — D631 Anemia in chronic kidney disease: Secondary | ICD-10-CM | POA: Diagnosis not present

## 2017-09-09 DIAGNOSIS — D509 Iron deficiency anemia, unspecified: Secondary | ICD-10-CM | POA: Diagnosis not present

## 2017-09-10 DIAGNOSIS — N186 End stage renal disease: Secondary | ICD-10-CM | POA: Diagnosis not present

## 2017-09-10 DIAGNOSIS — D509 Iron deficiency anemia, unspecified: Secondary | ICD-10-CM | POA: Diagnosis not present

## 2017-09-10 DIAGNOSIS — D631 Anemia in chronic kidney disease: Secondary | ICD-10-CM | POA: Diagnosis not present

## 2017-09-10 DIAGNOSIS — N2581 Secondary hyperparathyroidism of renal origin: Secondary | ICD-10-CM | POA: Diagnosis not present

## 2017-09-11 DIAGNOSIS — D509 Iron deficiency anemia, unspecified: Secondary | ICD-10-CM | POA: Diagnosis not present

## 2017-09-11 DIAGNOSIS — N2581 Secondary hyperparathyroidism of renal origin: Secondary | ICD-10-CM | POA: Diagnosis not present

## 2017-09-11 DIAGNOSIS — N186 End stage renal disease: Secondary | ICD-10-CM | POA: Diagnosis not present

## 2017-09-11 DIAGNOSIS — D631 Anemia in chronic kidney disease: Secondary | ICD-10-CM | POA: Diagnosis not present

## 2017-09-12 DIAGNOSIS — N2581 Secondary hyperparathyroidism of renal origin: Secondary | ICD-10-CM | POA: Diagnosis not present

## 2017-09-12 DIAGNOSIS — N186 End stage renal disease: Secondary | ICD-10-CM | POA: Diagnosis not present

## 2017-09-12 DIAGNOSIS — D509 Iron deficiency anemia, unspecified: Secondary | ICD-10-CM | POA: Diagnosis not present

## 2017-09-12 DIAGNOSIS — D631 Anemia in chronic kidney disease: Secondary | ICD-10-CM | POA: Diagnosis not present

## 2017-09-13 DIAGNOSIS — N186 End stage renal disease: Secondary | ICD-10-CM | POA: Diagnosis not present

## 2017-09-13 DIAGNOSIS — N2581 Secondary hyperparathyroidism of renal origin: Secondary | ICD-10-CM | POA: Diagnosis not present

## 2017-09-13 DIAGNOSIS — D509 Iron deficiency anemia, unspecified: Secondary | ICD-10-CM | POA: Diagnosis not present

## 2017-09-13 DIAGNOSIS — D631 Anemia in chronic kidney disease: Secondary | ICD-10-CM | POA: Diagnosis not present

## 2017-09-14 DIAGNOSIS — N186 End stage renal disease: Secondary | ICD-10-CM | POA: Diagnosis not present

## 2017-09-14 DIAGNOSIS — D509 Iron deficiency anemia, unspecified: Secondary | ICD-10-CM | POA: Diagnosis not present

## 2017-09-14 DIAGNOSIS — D631 Anemia in chronic kidney disease: Secondary | ICD-10-CM | POA: Diagnosis not present

## 2017-09-14 DIAGNOSIS — N2581 Secondary hyperparathyroidism of renal origin: Secondary | ICD-10-CM | POA: Diagnosis not present

## 2017-09-15 DIAGNOSIS — D509 Iron deficiency anemia, unspecified: Secondary | ICD-10-CM | POA: Diagnosis not present

## 2017-09-15 DIAGNOSIS — N2581 Secondary hyperparathyroidism of renal origin: Secondary | ICD-10-CM | POA: Diagnosis not present

## 2017-09-15 DIAGNOSIS — D631 Anemia in chronic kidney disease: Secondary | ICD-10-CM | POA: Diagnosis not present

## 2017-09-15 DIAGNOSIS — N186 End stage renal disease: Secondary | ICD-10-CM | POA: Diagnosis not present

## 2017-09-16 DIAGNOSIS — D509 Iron deficiency anemia, unspecified: Secondary | ICD-10-CM | POA: Diagnosis not present

## 2017-09-16 DIAGNOSIS — D631 Anemia in chronic kidney disease: Secondary | ICD-10-CM | POA: Diagnosis not present

## 2017-09-16 DIAGNOSIS — N2581 Secondary hyperparathyroidism of renal origin: Secondary | ICD-10-CM | POA: Diagnosis not present

## 2017-09-16 DIAGNOSIS — N186 End stage renal disease: Secondary | ICD-10-CM | POA: Diagnosis not present

## 2017-09-17 DIAGNOSIS — D509 Iron deficiency anemia, unspecified: Secondary | ICD-10-CM | POA: Diagnosis not present

## 2017-09-17 DIAGNOSIS — N2581 Secondary hyperparathyroidism of renal origin: Secondary | ICD-10-CM | POA: Diagnosis not present

## 2017-09-17 DIAGNOSIS — D631 Anemia in chronic kidney disease: Secondary | ICD-10-CM | POA: Diagnosis not present

## 2017-09-17 DIAGNOSIS — N186 End stage renal disease: Secondary | ICD-10-CM | POA: Diagnosis not present

## 2017-09-18 DIAGNOSIS — N186 End stage renal disease: Secondary | ICD-10-CM | POA: Diagnosis not present

## 2017-09-18 DIAGNOSIS — D509 Iron deficiency anemia, unspecified: Secondary | ICD-10-CM | POA: Diagnosis not present

## 2017-09-18 DIAGNOSIS — N2581 Secondary hyperparathyroidism of renal origin: Secondary | ICD-10-CM | POA: Diagnosis not present

## 2017-09-18 DIAGNOSIS — D631 Anemia in chronic kidney disease: Secondary | ICD-10-CM | POA: Diagnosis not present

## 2017-09-19 DIAGNOSIS — N2581 Secondary hyperparathyroidism of renal origin: Secondary | ICD-10-CM | POA: Diagnosis not present

## 2017-09-19 DIAGNOSIS — N186 End stage renal disease: Secondary | ICD-10-CM | POA: Diagnosis not present

## 2017-09-19 DIAGNOSIS — D509 Iron deficiency anemia, unspecified: Secondary | ICD-10-CM | POA: Diagnosis not present

## 2017-09-19 DIAGNOSIS — D631 Anemia in chronic kidney disease: Secondary | ICD-10-CM | POA: Diagnosis not present

## 2017-09-20 DIAGNOSIS — D631 Anemia in chronic kidney disease: Secondary | ICD-10-CM | POA: Diagnosis not present

## 2017-09-20 DIAGNOSIS — D509 Iron deficiency anemia, unspecified: Secondary | ICD-10-CM | POA: Diagnosis not present

## 2017-09-20 DIAGNOSIS — N2581 Secondary hyperparathyroidism of renal origin: Secondary | ICD-10-CM | POA: Diagnosis not present

## 2017-09-20 DIAGNOSIS — N186 End stage renal disease: Secondary | ICD-10-CM | POA: Diagnosis not present

## 2017-09-21 DIAGNOSIS — N186 End stage renal disease: Secondary | ICD-10-CM | POA: Diagnosis not present

## 2017-09-21 DIAGNOSIS — D509 Iron deficiency anemia, unspecified: Secondary | ICD-10-CM | POA: Diagnosis not present

## 2017-09-21 DIAGNOSIS — D631 Anemia in chronic kidney disease: Secondary | ICD-10-CM | POA: Diagnosis not present

## 2017-09-21 DIAGNOSIS — N2581 Secondary hyperparathyroidism of renal origin: Secondary | ICD-10-CM | POA: Diagnosis not present

## 2017-09-22 DIAGNOSIS — N2581 Secondary hyperparathyroidism of renal origin: Secondary | ICD-10-CM | POA: Diagnosis not present

## 2017-09-22 DIAGNOSIS — Z992 Dependence on renal dialysis: Secondary | ICD-10-CM | POA: Diagnosis not present

## 2017-09-22 DIAGNOSIS — N186 End stage renal disease: Secondary | ICD-10-CM | POA: Diagnosis not present

## 2017-09-22 DIAGNOSIS — D631 Anemia in chronic kidney disease: Secondary | ICD-10-CM | POA: Diagnosis not present

## 2017-09-22 DIAGNOSIS — D509 Iron deficiency anemia, unspecified: Secondary | ICD-10-CM | POA: Diagnosis not present

## 2017-09-23 DIAGNOSIS — D631 Anemia in chronic kidney disease: Secondary | ICD-10-CM | POA: Diagnosis not present

## 2017-09-23 DIAGNOSIS — D509 Iron deficiency anemia, unspecified: Secondary | ICD-10-CM | POA: Diagnosis not present

## 2017-09-23 DIAGNOSIS — N186 End stage renal disease: Secondary | ICD-10-CM | POA: Diagnosis not present

## 2017-09-23 DIAGNOSIS — N2581 Secondary hyperparathyroidism of renal origin: Secondary | ICD-10-CM | POA: Diagnosis not present

## 2017-09-24 DIAGNOSIS — D631 Anemia in chronic kidney disease: Secondary | ICD-10-CM | POA: Diagnosis not present

## 2017-09-24 DIAGNOSIS — D509 Iron deficiency anemia, unspecified: Secondary | ICD-10-CM | POA: Diagnosis not present

## 2017-09-24 DIAGNOSIS — N2581 Secondary hyperparathyroidism of renal origin: Secondary | ICD-10-CM | POA: Diagnosis not present

## 2017-09-24 DIAGNOSIS — N186 End stage renal disease: Secondary | ICD-10-CM | POA: Diagnosis not present

## 2017-09-25 DIAGNOSIS — N2581 Secondary hyperparathyroidism of renal origin: Secondary | ICD-10-CM | POA: Diagnosis not present

## 2017-09-25 DIAGNOSIS — N186 End stage renal disease: Secondary | ICD-10-CM | POA: Diagnosis not present

## 2017-09-25 DIAGNOSIS — D509 Iron deficiency anemia, unspecified: Secondary | ICD-10-CM | POA: Diagnosis not present

## 2017-09-25 DIAGNOSIS — D631 Anemia in chronic kidney disease: Secondary | ICD-10-CM | POA: Diagnosis not present

## 2017-09-26 DIAGNOSIS — D631 Anemia in chronic kidney disease: Secondary | ICD-10-CM | POA: Diagnosis not present

## 2017-09-26 DIAGNOSIS — N186 End stage renal disease: Secondary | ICD-10-CM | POA: Diagnosis not present

## 2017-09-26 DIAGNOSIS — D509 Iron deficiency anemia, unspecified: Secondary | ICD-10-CM | POA: Diagnosis not present

## 2017-09-26 DIAGNOSIS — N2581 Secondary hyperparathyroidism of renal origin: Secondary | ICD-10-CM | POA: Diagnosis not present

## 2017-09-27 DIAGNOSIS — D509 Iron deficiency anemia, unspecified: Secondary | ICD-10-CM | POA: Diagnosis not present

## 2017-09-27 DIAGNOSIS — N2581 Secondary hyperparathyroidism of renal origin: Secondary | ICD-10-CM | POA: Diagnosis not present

## 2017-09-27 DIAGNOSIS — D631 Anemia in chronic kidney disease: Secondary | ICD-10-CM | POA: Diagnosis not present

## 2017-09-27 DIAGNOSIS — N186 End stage renal disease: Secondary | ICD-10-CM | POA: Diagnosis not present

## 2017-09-28 DIAGNOSIS — N2581 Secondary hyperparathyroidism of renal origin: Secondary | ICD-10-CM | POA: Diagnosis not present

## 2017-09-28 DIAGNOSIS — N186 End stage renal disease: Secondary | ICD-10-CM | POA: Diagnosis not present

## 2017-09-28 DIAGNOSIS — D631 Anemia in chronic kidney disease: Secondary | ICD-10-CM | POA: Diagnosis not present

## 2017-09-28 DIAGNOSIS — D509 Iron deficiency anemia, unspecified: Secondary | ICD-10-CM | POA: Diagnosis not present

## 2017-09-29 DIAGNOSIS — N186 End stage renal disease: Secondary | ICD-10-CM | POA: Diagnosis not present

## 2017-09-29 DIAGNOSIS — N2581 Secondary hyperparathyroidism of renal origin: Secondary | ICD-10-CM | POA: Diagnosis not present

## 2017-09-29 DIAGNOSIS — D631 Anemia in chronic kidney disease: Secondary | ICD-10-CM | POA: Diagnosis not present

## 2017-09-29 DIAGNOSIS — D509 Iron deficiency anemia, unspecified: Secondary | ICD-10-CM | POA: Diagnosis not present

## 2017-09-30 DIAGNOSIS — D631 Anemia in chronic kidney disease: Secondary | ICD-10-CM | POA: Diagnosis not present

## 2017-09-30 DIAGNOSIS — D509 Iron deficiency anemia, unspecified: Secondary | ICD-10-CM | POA: Diagnosis not present

## 2017-09-30 DIAGNOSIS — N186 End stage renal disease: Secondary | ICD-10-CM | POA: Diagnosis not present

## 2017-09-30 DIAGNOSIS — N2581 Secondary hyperparathyroidism of renal origin: Secondary | ICD-10-CM | POA: Diagnosis not present

## 2017-10-01 DIAGNOSIS — D631 Anemia in chronic kidney disease: Secondary | ICD-10-CM | POA: Diagnosis not present

## 2017-10-01 DIAGNOSIS — N2581 Secondary hyperparathyroidism of renal origin: Secondary | ICD-10-CM | POA: Diagnosis not present

## 2017-10-01 DIAGNOSIS — D509 Iron deficiency anemia, unspecified: Secondary | ICD-10-CM | POA: Diagnosis not present

## 2017-10-01 DIAGNOSIS — N186 End stage renal disease: Secondary | ICD-10-CM | POA: Diagnosis not present

## 2017-10-02 DIAGNOSIS — D509 Iron deficiency anemia, unspecified: Secondary | ICD-10-CM | POA: Diagnosis not present

## 2017-10-02 DIAGNOSIS — N2581 Secondary hyperparathyroidism of renal origin: Secondary | ICD-10-CM | POA: Diagnosis not present

## 2017-10-02 DIAGNOSIS — N186 End stage renal disease: Secondary | ICD-10-CM | POA: Diagnosis not present

## 2017-10-02 DIAGNOSIS — D631 Anemia in chronic kidney disease: Secondary | ICD-10-CM | POA: Diagnosis not present

## 2017-10-03 DIAGNOSIS — D509 Iron deficiency anemia, unspecified: Secondary | ICD-10-CM | POA: Diagnosis not present

## 2017-10-03 DIAGNOSIS — D631 Anemia in chronic kidney disease: Secondary | ICD-10-CM | POA: Diagnosis not present

## 2017-10-03 DIAGNOSIS — N2581 Secondary hyperparathyroidism of renal origin: Secondary | ICD-10-CM | POA: Diagnosis not present

## 2017-10-03 DIAGNOSIS — N186 End stage renal disease: Secondary | ICD-10-CM | POA: Diagnosis not present

## 2017-10-04 DIAGNOSIS — N2581 Secondary hyperparathyroidism of renal origin: Secondary | ICD-10-CM | POA: Diagnosis not present

## 2017-10-04 DIAGNOSIS — D509 Iron deficiency anemia, unspecified: Secondary | ICD-10-CM | POA: Diagnosis not present

## 2017-10-04 DIAGNOSIS — N186 End stage renal disease: Secondary | ICD-10-CM | POA: Diagnosis not present

## 2017-10-04 DIAGNOSIS — D631 Anemia in chronic kidney disease: Secondary | ICD-10-CM | POA: Diagnosis not present

## 2017-10-05 DIAGNOSIS — D509 Iron deficiency anemia, unspecified: Secondary | ICD-10-CM | POA: Diagnosis not present

## 2017-10-05 DIAGNOSIS — N186 End stage renal disease: Secondary | ICD-10-CM | POA: Diagnosis not present

## 2017-10-05 DIAGNOSIS — N2581 Secondary hyperparathyroidism of renal origin: Secondary | ICD-10-CM | POA: Diagnosis not present

## 2017-10-05 DIAGNOSIS — D631 Anemia in chronic kidney disease: Secondary | ICD-10-CM | POA: Diagnosis not present

## 2017-10-06 DIAGNOSIS — D509 Iron deficiency anemia, unspecified: Secondary | ICD-10-CM | POA: Diagnosis not present

## 2017-10-06 DIAGNOSIS — D631 Anemia in chronic kidney disease: Secondary | ICD-10-CM | POA: Diagnosis not present

## 2017-10-06 DIAGNOSIS — N2581 Secondary hyperparathyroidism of renal origin: Secondary | ICD-10-CM | POA: Diagnosis not present

## 2017-10-06 DIAGNOSIS — N186 End stage renal disease: Secondary | ICD-10-CM | POA: Diagnosis not present

## 2017-10-07 DIAGNOSIS — D631 Anemia in chronic kidney disease: Secondary | ICD-10-CM | POA: Diagnosis not present

## 2017-10-07 DIAGNOSIS — N2581 Secondary hyperparathyroidism of renal origin: Secondary | ICD-10-CM | POA: Diagnosis not present

## 2017-10-07 DIAGNOSIS — D509 Iron deficiency anemia, unspecified: Secondary | ICD-10-CM | POA: Diagnosis not present

## 2017-10-07 DIAGNOSIS — N186 End stage renal disease: Secondary | ICD-10-CM | POA: Diagnosis not present

## 2017-10-08 DIAGNOSIS — D631 Anemia in chronic kidney disease: Secondary | ICD-10-CM | POA: Diagnosis not present

## 2017-10-08 DIAGNOSIS — D509 Iron deficiency anemia, unspecified: Secondary | ICD-10-CM | POA: Diagnosis not present

## 2017-10-08 DIAGNOSIS — N2581 Secondary hyperparathyroidism of renal origin: Secondary | ICD-10-CM | POA: Diagnosis not present

## 2017-10-08 DIAGNOSIS — N186 End stage renal disease: Secondary | ICD-10-CM | POA: Diagnosis not present

## 2017-10-09 DIAGNOSIS — D631 Anemia in chronic kidney disease: Secondary | ICD-10-CM | POA: Diagnosis not present

## 2017-10-09 DIAGNOSIS — D509 Iron deficiency anemia, unspecified: Secondary | ICD-10-CM | POA: Diagnosis not present

## 2017-10-09 DIAGNOSIS — N2581 Secondary hyperparathyroidism of renal origin: Secondary | ICD-10-CM | POA: Diagnosis not present

## 2017-10-09 DIAGNOSIS — N186 End stage renal disease: Secondary | ICD-10-CM | POA: Diagnosis not present

## 2017-10-10 DIAGNOSIS — D631 Anemia in chronic kidney disease: Secondary | ICD-10-CM | POA: Diagnosis not present

## 2017-10-10 DIAGNOSIS — D509 Iron deficiency anemia, unspecified: Secondary | ICD-10-CM | POA: Diagnosis not present

## 2017-10-10 DIAGNOSIS — N2581 Secondary hyperparathyroidism of renal origin: Secondary | ICD-10-CM | POA: Diagnosis not present

## 2017-10-10 DIAGNOSIS — N186 End stage renal disease: Secondary | ICD-10-CM | POA: Diagnosis not present

## 2017-10-11 DIAGNOSIS — N2581 Secondary hyperparathyroidism of renal origin: Secondary | ICD-10-CM | POA: Diagnosis not present

## 2017-10-11 DIAGNOSIS — D631 Anemia in chronic kidney disease: Secondary | ICD-10-CM | POA: Diagnosis not present

## 2017-10-11 DIAGNOSIS — D509 Iron deficiency anemia, unspecified: Secondary | ICD-10-CM | POA: Diagnosis not present

## 2017-10-11 DIAGNOSIS — N186 End stage renal disease: Secondary | ICD-10-CM | POA: Diagnosis not present

## 2017-10-12 DIAGNOSIS — N2581 Secondary hyperparathyroidism of renal origin: Secondary | ICD-10-CM | POA: Diagnosis not present

## 2017-10-12 DIAGNOSIS — D631 Anemia in chronic kidney disease: Secondary | ICD-10-CM | POA: Diagnosis not present

## 2017-10-12 DIAGNOSIS — D509 Iron deficiency anemia, unspecified: Secondary | ICD-10-CM | POA: Diagnosis not present

## 2017-10-12 DIAGNOSIS — N186 End stage renal disease: Secondary | ICD-10-CM | POA: Diagnosis not present

## 2017-10-13 DIAGNOSIS — D631 Anemia in chronic kidney disease: Secondary | ICD-10-CM | POA: Diagnosis not present

## 2017-10-13 DIAGNOSIS — N2581 Secondary hyperparathyroidism of renal origin: Secondary | ICD-10-CM | POA: Diagnosis not present

## 2017-10-13 DIAGNOSIS — D509 Iron deficiency anemia, unspecified: Secondary | ICD-10-CM | POA: Diagnosis not present

## 2017-10-13 DIAGNOSIS — N186 End stage renal disease: Secondary | ICD-10-CM | POA: Diagnosis not present

## 2017-10-14 DIAGNOSIS — N186 End stage renal disease: Secondary | ICD-10-CM | POA: Diagnosis not present

## 2017-10-14 DIAGNOSIS — D631 Anemia in chronic kidney disease: Secondary | ICD-10-CM | POA: Diagnosis not present

## 2017-10-14 DIAGNOSIS — D509 Iron deficiency anemia, unspecified: Secondary | ICD-10-CM | POA: Diagnosis not present

## 2017-10-14 DIAGNOSIS — N2581 Secondary hyperparathyroidism of renal origin: Secondary | ICD-10-CM | POA: Diagnosis not present

## 2017-10-15 DIAGNOSIS — D509 Iron deficiency anemia, unspecified: Secondary | ICD-10-CM | POA: Diagnosis not present

## 2017-10-15 DIAGNOSIS — N2581 Secondary hyperparathyroidism of renal origin: Secondary | ICD-10-CM | POA: Diagnosis not present

## 2017-10-15 DIAGNOSIS — N186 End stage renal disease: Secondary | ICD-10-CM | POA: Diagnosis not present

## 2017-10-15 DIAGNOSIS — D631 Anemia in chronic kidney disease: Secondary | ICD-10-CM | POA: Diagnosis not present

## 2017-10-16 DIAGNOSIS — N2581 Secondary hyperparathyroidism of renal origin: Secondary | ICD-10-CM | POA: Diagnosis not present

## 2017-10-16 DIAGNOSIS — D509 Iron deficiency anemia, unspecified: Secondary | ICD-10-CM | POA: Diagnosis not present

## 2017-10-16 DIAGNOSIS — D631 Anemia in chronic kidney disease: Secondary | ICD-10-CM | POA: Diagnosis not present

## 2017-10-16 DIAGNOSIS — N186 End stage renal disease: Secondary | ICD-10-CM | POA: Diagnosis not present

## 2017-10-17 DIAGNOSIS — N186 End stage renal disease: Secondary | ICD-10-CM | POA: Diagnosis not present

## 2017-10-17 DIAGNOSIS — D631 Anemia in chronic kidney disease: Secondary | ICD-10-CM | POA: Diagnosis not present

## 2017-10-17 DIAGNOSIS — D509 Iron deficiency anemia, unspecified: Secondary | ICD-10-CM | POA: Diagnosis not present

## 2017-10-17 DIAGNOSIS — N2581 Secondary hyperparathyroidism of renal origin: Secondary | ICD-10-CM | POA: Diagnosis not present

## 2017-10-18 DIAGNOSIS — D631 Anemia in chronic kidney disease: Secondary | ICD-10-CM | POA: Diagnosis not present

## 2017-10-18 DIAGNOSIS — D509 Iron deficiency anemia, unspecified: Secondary | ICD-10-CM | POA: Diagnosis not present

## 2017-10-18 DIAGNOSIS — N186 End stage renal disease: Secondary | ICD-10-CM | POA: Diagnosis not present

## 2017-10-18 DIAGNOSIS — N2581 Secondary hyperparathyroidism of renal origin: Secondary | ICD-10-CM | POA: Diagnosis not present

## 2017-10-19 DIAGNOSIS — N186 End stage renal disease: Secondary | ICD-10-CM | POA: Diagnosis not present

## 2017-10-19 DIAGNOSIS — D509 Iron deficiency anemia, unspecified: Secondary | ICD-10-CM | POA: Diagnosis not present

## 2017-10-19 DIAGNOSIS — D631 Anemia in chronic kidney disease: Secondary | ICD-10-CM | POA: Diagnosis not present

## 2017-10-19 DIAGNOSIS — N2581 Secondary hyperparathyroidism of renal origin: Secondary | ICD-10-CM | POA: Diagnosis not present

## 2017-10-20 DIAGNOSIS — D631 Anemia in chronic kidney disease: Secondary | ICD-10-CM | POA: Diagnosis not present

## 2017-10-20 DIAGNOSIS — D509 Iron deficiency anemia, unspecified: Secondary | ICD-10-CM | POA: Diagnosis not present

## 2017-10-20 DIAGNOSIS — N2581 Secondary hyperparathyroidism of renal origin: Secondary | ICD-10-CM | POA: Diagnosis not present

## 2017-10-20 DIAGNOSIS — N186 End stage renal disease: Secondary | ICD-10-CM | POA: Diagnosis not present

## 2017-10-21 DIAGNOSIS — D509 Iron deficiency anemia, unspecified: Secondary | ICD-10-CM | POA: Diagnosis not present

## 2017-10-21 DIAGNOSIS — D631 Anemia in chronic kidney disease: Secondary | ICD-10-CM | POA: Diagnosis not present

## 2017-10-21 DIAGNOSIS — N2581 Secondary hyperparathyroidism of renal origin: Secondary | ICD-10-CM | POA: Diagnosis not present

## 2017-10-21 DIAGNOSIS — N186 End stage renal disease: Secondary | ICD-10-CM | POA: Diagnosis not present

## 2017-10-22 DIAGNOSIS — N186 End stage renal disease: Secondary | ICD-10-CM | POA: Diagnosis not present

## 2017-10-22 DIAGNOSIS — N2581 Secondary hyperparathyroidism of renal origin: Secondary | ICD-10-CM | POA: Diagnosis not present

## 2017-10-22 DIAGNOSIS — D631 Anemia in chronic kidney disease: Secondary | ICD-10-CM | POA: Diagnosis not present

## 2017-10-22 DIAGNOSIS — D509 Iron deficiency anemia, unspecified: Secondary | ICD-10-CM | POA: Diagnosis not present

## 2017-10-23 DIAGNOSIS — N186 End stage renal disease: Secondary | ICD-10-CM | POA: Diagnosis not present

## 2017-10-23 DIAGNOSIS — D631 Anemia in chronic kidney disease: Secondary | ICD-10-CM | POA: Diagnosis not present

## 2017-10-23 DIAGNOSIS — Z992 Dependence on renal dialysis: Secondary | ICD-10-CM | POA: Diagnosis not present

## 2017-10-23 DIAGNOSIS — N2581 Secondary hyperparathyroidism of renal origin: Secondary | ICD-10-CM | POA: Diagnosis not present

## 2017-10-23 DIAGNOSIS — D509 Iron deficiency anemia, unspecified: Secondary | ICD-10-CM | POA: Diagnosis not present

## 2017-10-24 DIAGNOSIS — D509 Iron deficiency anemia, unspecified: Secondary | ICD-10-CM | POA: Diagnosis not present

## 2017-10-24 DIAGNOSIS — N186 End stage renal disease: Secondary | ICD-10-CM | POA: Diagnosis not present

## 2017-10-24 DIAGNOSIS — N2581 Secondary hyperparathyroidism of renal origin: Secondary | ICD-10-CM | POA: Diagnosis not present

## 2017-10-24 DIAGNOSIS — D631 Anemia in chronic kidney disease: Secondary | ICD-10-CM | POA: Diagnosis not present

## 2017-10-25 DIAGNOSIS — N186 End stage renal disease: Secondary | ICD-10-CM | POA: Diagnosis not present

## 2017-10-25 DIAGNOSIS — D631 Anemia in chronic kidney disease: Secondary | ICD-10-CM | POA: Diagnosis not present

## 2017-10-25 DIAGNOSIS — N2581 Secondary hyperparathyroidism of renal origin: Secondary | ICD-10-CM | POA: Diagnosis not present

## 2017-10-25 DIAGNOSIS — D509 Iron deficiency anemia, unspecified: Secondary | ICD-10-CM | POA: Diagnosis not present

## 2017-10-26 DIAGNOSIS — D509 Iron deficiency anemia, unspecified: Secondary | ICD-10-CM | POA: Diagnosis not present

## 2017-10-26 DIAGNOSIS — N186 End stage renal disease: Secondary | ICD-10-CM | POA: Diagnosis not present

## 2017-10-26 DIAGNOSIS — D631 Anemia in chronic kidney disease: Secondary | ICD-10-CM | POA: Diagnosis not present

## 2017-10-26 DIAGNOSIS — N2581 Secondary hyperparathyroidism of renal origin: Secondary | ICD-10-CM | POA: Diagnosis not present

## 2017-10-27 DIAGNOSIS — N2581 Secondary hyperparathyroidism of renal origin: Secondary | ICD-10-CM | POA: Diagnosis not present

## 2017-10-27 DIAGNOSIS — N186 End stage renal disease: Secondary | ICD-10-CM | POA: Diagnosis not present

## 2017-10-27 DIAGNOSIS — D631 Anemia in chronic kidney disease: Secondary | ICD-10-CM | POA: Diagnosis not present

## 2017-10-27 DIAGNOSIS — D509 Iron deficiency anemia, unspecified: Secondary | ICD-10-CM | POA: Diagnosis not present

## 2017-10-28 DIAGNOSIS — D509 Iron deficiency anemia, unspecified: Secondary | ICD-10-CM | POA: Diagnosis not present

## 2017-10-28 DIAGNOSIS — N2581 Secondary hyperparathyroidism of renal origin: Secondary | ICD-10-CM | POA: Diagnosis not present

## 2017-10-28 DIAGNOSIS — N186 End stage renal disease: Secondary | ICD-10-CM | POA: Diagnosis not present

## 2017-10-28 DIAGNOSIS — D631 Anemia in chronic kidney disease: Secondary | ICD-10-CM | POA: Diagnosis not present

## 2017-10-29 DIAGNOSIS — D509 Iron deficiency anemia, unspecified: Secondary | ICD-10-CM | POA: Diagnosis not present

## 2017-10-29 DIAGNOSIS — D631 Anemia in chronic kidney disease: Secondary | ICD-10-CM | POA: Diagnosis not present

## 2017-10-29 DIAGNOSIS — N2581 Secondary hyperparathyroidism of renal origin: Secondary | ICD-10-CM | POA: Diagnosis not present

## 2017-10-29 DIAGNOSIS — N186 End stage renal disease: Secondary | ICD-10-CM | POA: Diagnosis not present

## 2017-10-30 DIAGNOSIS — N186 End stage renal disease: Secondary | ICD-10-CM | POA: Diagnosis not present

## 2017-10-30 DIAGNOSIS — D631 Anemia in chronic kidney disease: Secondary | ICD-10-CM | POA: Diagnosis not present

## 2017-10-30 DIAGNOSIS — D509 Iron deficiency anemia, unspecified: Secondary | ICD-10-CM | POA: Diagnosis not present

## 2017-10-30 DIAGNOSIS — N2581 Secondary hyperparathyroidism of renal origin: Secondary | ICD-10-CM | POA: Diagnosis not present

## 2017-10-31 DIAGNOSIS — N2581 Secondary hyperparathyroidism of renal origin: Secondary | ICD-10-CM | POA: Diagnosis not present

## 2017-10-31 DIAGNOSIS — D509 Iron deficiency anemia, unspecified: Secondary | ICD-10-CM | POA: Diagnosis not present

## 2017-10-31 DIAGNOSIS — N186 End stage renal disease: Secondary | ICD-10-CM | POA: Diagnosis not present

## 2017-10-31 DIAGNOSIS — D631 Anemia in chronic kidney disease: Secondary | ICD-10-CM | POA: Diagnosis not present

## 2017-11-01 DIAGNOSIS — D631 Anemia in chronic kidney disease: Secondary | ICD-10-CM | POA: Diagnosis not present

## 2017-11-01 DIAGNOSIS — D509 Iron deficiency anemia, unspecified: Secondary | ICD-10-CM | POA: Diagnosis not present

## 2017-11-01 DIAGNOSIS — N2581 Secondary hyperparathyroidism of renal origin: Secondary | ICD-10-CM | POA: Diagnosis not present

## 2017-11-01 DIAGNOSIS — N186 End stage renal disease: Secondary | ICD-10-CM | POA: Diagnosis not present

## 2017-11-02 DIAGNOSIS — N2581 Secondary hyperparathyroidism of renal origin: Secondary | ICD-10-CM | POA: Diagnosis not present

## 2017-11-02 DIAGNOSIS — N186 End stage renal disease: Secondary | ICD-10-CM | POA: Diagnosis not present

## 2017-11-02 DIAGNOSIS — D509 Iron deficiency anemia, unspecified: Secondary | ICD-10-CM | POA: Diagnosis not present

## 2017-11-02 DIAGNOSIS — D631 Anemia in chronic kidney disease: Secondary | ICD-10-CM | POA: Diagnosis not present

## 2017-11-03 DIAGNOSIS — N186 End stage renal disease: Secondary | ICD-10-CM | POA: Diagnosis not present

## 2017-11-03 DIAGNOSIS — D509 Iron deficiency anemia, unspecified: Secondary | ICD-10-CM | POA: Diagnosis not present

## 2017-11-03 DIAGNOSIS — D631 Anemia in chronic kidney disease: Secondary | ICD-10-CM | POA: Diagnosis not present

## 2017-11-03 DIAGNOSIS — N2581 Secondary hyperparathyroidism of renal origin: Secondary | ICD-10-CM | POA: Diagnosis not present

## 2017-11-04 DIAGNOSIS — D509 Iron deficiency anemia, unspecified: Secondary | ICD-10-CM | POA: Diagnosis not present

## 2017-11-04 DIAGNOSIS — D631 Anemia in chronic kidney disease: Secondary | ICD-10-CM | POA: Diagnosis not present

## 2017-11-04 DIAGNOSIS — N2581 Secondary hyperparathyroidism of renal origin: Secondary | ICD-10-CM | POA: Diagnosis not present

## 2017-11-04 DIAGNOSIS — N186 End stage renal disease: Secondary | ICD-10-CM | POA: Diagnosis not present

## 2017-11-05 DIAGNOSIS — D631 Anemia in chronic kidney disease: Secondary | ICD-10-CM | POA: Diagnosis not present

## 2017-11-05 DIAGNOSIS — D509 Iron deficiency anemia, unspecified: Secondary | ICD-10-CM | POA: Diagnosis not present

## 2017-11-05 DIAGNOSIS — N2581 Secondary hyperparathyroidism of renal origin: Secondary | ICD-10-CM | POA: Diagnosis not present

## 2017-11-05 DIAGNOSIS — N186 End stage renal disease: Secondary | ICD-10-CM | POA: Diagnosis not present

## 2017-11-06 DIAGNOSIS — N186 End stage renal disease: Secondary | ICD-10-CM | POA: Diagnosis not present

## 2017-11-06 DIAGNOSIS — D631 Anemia in chronic kidney disease: Secondary | ICD-10-CM | POA: Diagnosis not present

## 2017-11-06 DIAGNOSIS — D509 Iron deficiency anemia, unspecified: Secondary | ICD-10-CM | POA: Diagnosis not present

## 2017-11-06 DIAGNOSIS — N2581 Secondary hyperparathyroidism of renal origin: Secondary | ICD-10-CM | POA: Diagnosis not present

## 2017-11-07 DIAGNOSIS — D509 Iron deficiency anemia, unspecified: Secondary | ICD-10-CM | POA: Diagnosis not present

## 2017-11-07 DIAGNOSIS — D631 Anemia in chronic kidney disease: Secondary | ICD-10-CM | POA: Diagnosis not present

## 2017-11-07 DIAGNOSIS — N2581 Secondary hyperparathyroidism of renal origin: Secondary | ICD-10-CM | POA: Diagnosis not present

## 2017-11-07 DIAGNOSIS — N186 End stage renal disease: Secondary | ICD-10-CM | POA: Diagnosis not present

## 2017-11-08 DIAGNOSIS — Z4932 Encounter for adequacy testing for peritoneal dialysis: Secondary | ICD-10-CM | POA: Diagnosis not present

## 2017-11-08 DIAGNOSIS — N186 End stage renal disease: Secondary | ICD-10-CM | POA: Diagnosis not present

## 2017-11-08 DIAGNOSIS — D631 Anemia in chronic kidney disease: Secondary | ICD-10-CM | POA: Diagnosis not present

## 2017-11-08 DIAGNOSIS — N2581 Secondary hyperparathyroidism of renal origin: Secondary | ICD-10-CM | POA: Diagnosis not present

## 2017-11-08 DIAGNOSIS — D509 Iron deficiency anemia, unspecified: Secondary | ICD-10-CM | POA: Diagnosis not present

## 2017-11-09 DIAGNOSIS — D631 Anemia in chronic kidney disease: Secondary | ICD-10-CM | POA: Diagnosis not present

## 2017-11-09 DIAGNOSIS — N2581 Secondary hyperparathyroidism of renal origin: Secondary | ICD-10-CM | POA: Diagnosis not present

## 2017-11-09 DIAGNOSIS — D509 Iron deficiency anemia, unspecified: Secondary | ICD-10-CM | POA: Diagnosis not present

## 2017-11-09 DIAGNOSIS — N186 End stage renal disease: Secondary | ICD-10-CM | POA: Diagnosis not present

## 2017-11-10 DIAGNOSIS — D509 Iron deficiency anemia, unspecified: Secondary | ICD-10-CM | POA: Diagnosis not present

## 2017-11-10 DIAGNOSIS — N2581 Secondary hyperparathyroidism of renal origin: Secondary | ICD-10-CM | POA: Diagnosis not present

## 2017-11-10 DIAGNOSIS — N186 End stage renal disease: Secondary | ICD-10-CM | POA: Diagnosis not present

## 2017-11-10 DIAGNOSIS — D631 Anemia in chronic kidney disease: Secondary | ICD-10-CM | POA: Diagnosis not present

## 2017-11-11 DIAGNOSIS — D631 Anemia in chronic kidney disease: Secondary | ICD-10-CM | POA: Diagnosis not present

## 2017-11-11 DIAGNOSIS — N2581 Secondary hyperparathyroidism of renal origin: Secondary | ICD-10-CM | POA: Diagnosis not present

## 2017-11-11 DIAGNOSIS — N186 End stage renal disease: Secondary | ICD-10-CM | POA: Diagnosis not present

## 2017-11-11 DIAGNOSIS — D509 Iron deficiency anemia, unspecified: Secondary | ICD-10-CM | POA: Diagnosis not present

## 2017-11-12 DIAGNOSIS — D509 Iron deficiency anemia, unspecified: Secondary | ICD-10-CM | POA: Diagnosis not present

## 2017-11-12 DIAGNOSIS — N186 End stage renal disease: Secondary | ICD-10-CM | POA: Diagnosis not present

## 2017-11-12 DIAGNOSIS — D631 Anemia in chronic kidney disease: Secondary | ICD-10-CM | POA: Diagnosis not present

## 2017-11-12 DIAGNOSIS — N2581 Secondary hyperparathyroidism of renal origin: Secondary | ICD-10-CM | POA: Diagnosis not present

## 2017-11-13 DIAGNOSIS — D631 Anemia in chronic kidney disease: Secondary | ICD-10-CM | POA: Diagnosis not present

## 2017-11-13 DIAGNOSIS — N186 End stage renal disease: Secondary | ICD-10-CM | POA: Diagnosis not present

## 2017-11-13 DIAGNOSIS — N2581 Secondary hyperparathyroidism of renal origin: Secondary | ICD-10-CM | POA: Diagnosis not present

## 2017-11-13 DIAGNOSIS — D509 Iron deficiency anemia, unspecified: Secondary | ICD-10-CM | POA: Diagnosis not present

## 2017-11-14 DIAGNOSIS — N2581 Secondary hyperparathyroidism of renal origin: Secondary | ICD-10-CM | POA: Diagnosis not present

## 2017-11-14 DIAGNOSIS — D509 Iron deficiency anemia, unspecified: Secondary | ICD-10-CM | POA: Diagnosis not present

## 2017-11-14 DIAGNOSIS — N186 End stage renal disease: Secondary | ICD-10-CM | POA: Diagnosis not present

## 2017-11-14 DIAGNOSIS — D631 Anemia in chronic kidney disease: Secondary | ICD-10-CM | POA: Diagnosis not present

## 2017-11-15 DIAGNOSIS — N2581 Secondary hyperparathyroidism of renal origin: Secondary | ICD-10-CM | POA: Diagnosis not present

## 2017-11-15 DIAGNOSIS — D509 Iron deficiency anemia, unspecified: Secondary | ICD-10-CM | POA: Diagnosis not present

## 2017-11-15 DIAGNOSIS — D631 Anemia in chronic kidney disease: Secondary | ICD-10-CM | POA: Diagnosis not present

## 2017-11-15 DIAGNOSIS — N186 End stage renal disease: Secondary | ICD-10-CM | POA: Diagnosis not present

## 2017-11-16 DIAGNOSIS — N2581 Secondary hyperparathyroidism of renal origin: Secondary | ICD-10-CM | POA: Diagnosis not present

## 2017-11-16 DIAGNOSIS — D631 Anemia in chronic kidney disease: Secondary | ICD-10-CM | POA: Diagnosis not present

## 2017-11-16 DIAGNOSIS — N186 End stage renal disease: Secondary | ICD-10-CM | POA: Diagnosis not present

## 2017-11-16 DIAGNOSIS — D509 Iron deficiency anemia, unspecified: Secondary | ICD-10-CM | POA: Diagnosis not present

## 2017-11-17 DIAGNOSIS — N2581 Secondary hyperparathyroidism of renal origin: Secondary | ICD-10-CM | POA: Diagnosis not present

## 2017-11-17 DIAGNOSIS — D631 Anemia in chronic kidney disease: Secondary | ICD-10-CM | POA: Diagnosis not present

## 2017-11-17 DIAGNOSIS — D509 Iron deficiency anemia, unspecified: Secondary | ICD-10-CM | POA: Diagnosis not present

## 2017-11-17 DIAGNOSIS — N186 End stage renal disease: Secondary | ICD-10-CM | POA: Diagnosis not present

## 2017-11-18 DIAGNOSIS — N2581 Secondary hyperparathyroidism of renal origin: Secondary | ICD-10-CM | POA: Diagnosis not present

## 2017-11-18 DIAGNOSIS — D631 Anemia in chronic kidney disease: Secondary | ICD-10-CM | POA: Diagnosis not present

## 2017-11-18 DIAGNOSIS — N186 End stage renal disease: Secondary | ICD-10-CM | POA: Diagnosis not present

## 2017-11-18 DIAGNOSIS — D509 Iron deficiency anemia, unspecified: Secondary | ICD-10-CM | POA: Diagnosis not present

## 2017-11-19 DIAGNOSIS — D631 Anemia in chronic kidney disease: Secondary | ICD-10-CM | POA: Diagnosis not present

## 2017-11-19 DIAGNOSIS — D509 Iron deficiency anemia, unspecified: Secondary | ICD-10-CM | POA: Diagnosis not present

## 2017-11-19 DIAGNOSIS — N186 End stage renal disease: Secondary | ICD-10-CM | POA: Diagnosis not present

## 2017-11-19 DIAGNOSIS — N2581 Secondary hyperparathyroidism of renal origin: Secondary | ICD-10-CM | POA: Diagnosis not present

## 2017-11-20 DIAGNOSIS — N2581 Secondary hyperparathyroidism of renal origin: Secondary | ICD-10-CM | POA: Diagnosis not present

## 2017-11-20 DIAGNOSIS — D631 Anemia in chronic kidney disease: Secondary | ICD-10-CM | POA: Diagnosis not present

## 2017-11-20 DIAGNOSIS — D509 Iron deficiency anemia, unspecified: Secondary | ICD-10-CM | POA: Diagnosis not present

## 2017-11-20 DIAGNOSIS — N186 End stage renal disease: Secondary | ICD-10-CM | POA: Diagnosis not present

## 2017-11-21 DIAGNOSIS — N186 End stage renal disease: Secondary | ICD-10-CM | POA: Diagnosis not present

## 2017-11-21 DIAGNOSIS — D631 Anemia in chronic kidney disease: Secondary | ICD-10-CM | POA: Diagnosis not present

## 2017-11-21 DIAGNOSIS — N2581 Secondary hyperparathyroidism of renal origin: Secondary | ICD-10-CM | POA: Diagnosis not present

## 2017-11-21 DIAGNOSIS — D509 Iron deficiency anemia, unspecified: Secondary | ICD-10-CM | POA: Diagnosis not present

## 2017-11-22 DIAGNOSIS — N186 End stage renal disease: Secondary | ICD-10-CM | POA: Diagnosis not present

## 2017-11-22 DIAGNOSIS — N2581 Secondary hyperparathyroidism of renal origin: Secondary | ICD-10-CM | POA: Diagnosis not present

## 2017-11-22 DIAGNOSIS — D509 Iron deficiency anemia, unspecified: Secondary | ICD-10-CM | POA: Diagnosis not present

## 2017-11-22 DIAGNOSIS — D631 Anemia in chronic kidney disease: Secondary | ICD-10-CM | POA: Diagnosis not present

## 2017-11-23 DIAGNOSIS — N186 End stage renal disease: Secondary | ICD-10-CM | POA: Diagnosis not present

## 2017-11-23 DIAGNOSIS — Z992 Dependence on renal dialysis: Secondary | ICD-10-CM | POA: Diagnosis not present

## 2017-11-23 DIAGNOSIS — N2581 Secondary hyperparathyroidism of renal origin: Secondary | ICD-10-CM | POA: Diagnosis not present

## 2017-11-23 DIAGNOSIS — D631 Anemia in chronic kidney disease: Secondary | ICD-10-CM | POA: Diagnosis not present

## 2017-11-23 DIAGNOSIS — D509 Iron deficiency anemia, unspecified: Secondary | ICD-10-CM | POA: Diagnosis not present

## 2017-11-24 DIAGNOSIS — D509 Iron deficiency anemia, unspecified: Secondary | ICD-10-CM | POA: Diagnosis not present

## 2017-11-24 DIAGNOSIS — D631 Anemia in chronic kidney disease: Secondary | ICD-10-CM | POA: Diagnosis not present

## 2017-11-24 DIAGNOSIS — N2581 Secondary hyperparathyroidism of renal origin: Secondary | ICD-10-CM | POA: Diagnosis not present

## 2017-11-24 DIAGNOSIS — N186 End stage renal disease: Secondary | ICD-10-CM | POA: Diagnosis not present

## 2017-11-25 DIAGNOSIS — D631 Anemia in chronic kidney disease: Secondary | ICD-10-CM | POA: Diagnosis not present

## 2017-11-25 DIAGNOSIS — N2581 Secondary hyperparathyroidism of renal origin: Secondary | ICD-10-CM | POA: Diagnosis not present

## 2017-11-25 DIAGNOSIS — N186 End stage renal disease: Secondary | ICD-10-CM | POA: Diagnosis not present

## 2017-11-25 DIAGNOSIS — D509 Iron deficiency anemia, unspecified: Secondary | ICD-10-CM | POA: Diagnosis not present

## 2017-11-26 DIAGNOSIS — N186 End stage renal disease: Secondary | ICD-10-CM | POA: Diagnosis not present

## 2017-11-26 DIAGNOSIS — D509 Iron deficiency anemia, unspecified: Secondary | ICD-10-CM | POA: Diagnosis not present

## 2017-11-26 DIAGNOSIS — D631 Anemia in chronic kidney disease: Secondary | ICD-10-CM | POA: Diagnosis not present

## 2017-11-26 DIAGNOSIS — N2581 Secondary hyperparathyroidism of renal origin: Secondary | ICD-10-CM | POA: Diagnosis not present

## 2017-11-27 DIAGNOSIS — D631 Anemia in chronic kidney disease: Secondary | ICD-10-CM | POA: Diagnosis not present

## 2017-11-27 DIAGNOSIS — N2581 Secondary hyperparathyroidism of renal origin: Secondary | ICD-10-CM | POA: Diagnosis not present

## 2017-11-27 DIAGNOSIS — N186 End stage renal disease: Secondary | ICD-10-CM | POA: Diagnosis not present

## 2017-11-27 DIAGNOSIS — D509 Iron deficiency anemia, unspecified: Secondary | ICD-10-CM | POA: Diagnosis not present

## 2017-11-28 DIAGNOSIS — N2581 Secondary hyperparathyroidism of renal origin: Secondary | ICD-10-CM | POA: Diagnosis not present

## 2017-11-28 DIAGNOSIS — N186 End stage renal disease: Secondary | ICD-10-CM | POA: Diagnosis not present

## 2017-11-28 DIAGNOSIS — D631 Anemia in chronic kidney disease: Secondary | ICD-10-CM | POA: Diagnosis not present

## 2017-11-28 DIAGNOSIS — D509 Iron deficiency anemia, unspecified: Secondary | ICD-10-CM | POA: Diagnosis not present

## 2017-11-29 DIAGNOSIS — D631 Anemia in chronic kidney disease: Secondary | ICD-10-CM | POA: Diagnosis not present

## 2017-11-29 DIAGNOSIS — N2581 Secondary hyperparathyroidism of renal origin: Secondary | ICD-10-CM | POA: Diagnosis not present

## 2017-11-29 DIAGNOSIS — D509 Iron deficiency anemia, unspecified: Secondary | ICD-10-CM | POA: Diagnosis not present

## 2017-11-29 DIAGNOSIS — N186 End stage renal disease: Secondary | ICD-10-CM | POA: Diagnosis not present

## 2017-11-30 DIAGNOSIS — N186 End stage renal disease: Secondary | ICD-10-CM | POA: Diagnosis not present

## 2017-11-30 DIAGNOSIS — N2581 Secondary hyperparathyroidism of renal origin: Secondary | ICD-10-CM | POA: Diagnosis not present

## 2017-11-30 DIAGNOSIS — D509 Iron deficiency anemia, unspecified: Secondary | ICD-10-CM | POA: Diagnosis not present

## 2017-11-30 DIAGNOSIS — D631 Anemia in chronic kidney disease: Secondary | ICD-10-CM | POA: Diagnosis not present

## 2017-12-01 DIAGNOSIS — D509 Iron deficiency anemia, unspecified: Secondary | ICD-10-CM | POA: Diagnosis not present

## 2017-12-01 DIAGNOSIS — N2581 Secondary hyperparathyroidism of renal origin: Secondary | ICD-10-CM | POA: Diagnosis not present

## 2017-12-01 DIAGNOSIS — D631 Anemia in chronic kidney disease: Secondary | ICD-10-CM | POA: Diagnosis not present

## 2017-12-01 DIAGNOSIS — N186 End stage renal disease: Secondary | ICD-10-CM | POA: Diagnosis not present

## 2017-12-02 DIAGNOSIS — D509 Iron deficiency anemia, unspecified: Secondary | ICD-10-CM | POA: Diagnosis not present

## 2017-12-02 DIAGNOSIS — D631 Anemia in chronic kidney disease: Secondary | ICD-10-CM | POA: Diagnosis not present

## 2017-12-02 DIAGNOSIS — N2581 Secondary hyperparathyroidism of renal origin: Secondary | ICD-10-CM | POA: Diagnosis not present

## 2017-12-02 DIAGNOSIS — N186 End stage renal disease: Secondary | ICD-10-CM | POA: Diagnosis not present

## 2017-12-03 DIAGNOSIS — D631 Anemia in chronic kidney disease: Secondary | ICD-10-CM | POA: Diagnosis not present

## 2017-12-03 DIAGNOSIS — N186 End stage renal disease: Secondary | ICD-10-CM | POA: Diagnosis not present

## 2017-12-03 DIAGNOSIS — D509 Iron deficiency anemia, unspecified: Secondary | ICD-10-CM | POA: Diagnosis not present

## 2017-12-03 DIAGNOSIS — N2581 Secondary hyperparathyroidism of renal origin: Secondary | ICD-10-CM | POA: Diagnosis not present

## 2017-12-04 DIAGNOSIS — D509 Iron deficiency anemia, unspecified: Secondary | ICD-10-CM | POA: Diagnosis not present

## 2017-12-04 DIAGNOSIS — N2581 Secondary hyperparathyroidism of renal origin: Secondary | ICD-10-CM | POA: Diagnosis not present

## 2017-12-04 DIAGNOSIS — D631 Anemia in chronic kidney disease: Secondary | ICD-10-CM | POA: Diagnosis not present

## 2017-12-04 DIAGNOSIS — N186 End stage renal disease: Secondary | ICD-10-CM | POA: Diagnosis not present

## 2017-12-05 DIAGNOSIS — D509 Iron deficiency anemia, unspecified: Secondary | ICD-10-CM | POA: Diagnosis not present

## 2017-12-05 DIAGNOSIS — D631 Anemia in chronic kidney disease: Secondary | ICD-10-CM | POA: Diagnosis not present

## 2017-12-05 DIAGNOSIS — N186 End stage renal disease: Secondary | ICD-10-CM | POA: Diagnosis not present

## 2017-12-05 DIAGNOSIS — N2581 Secondary hyperparathyroidism of renal origin: Secondary | ICD-10-CM | POA: Diagnosis not present

## 2017-12-06 DIAGNOSIS — N2581 Secondary hyperparathyroidism of renal origin: Secondary | ICD-10-CM | POA: Diagnosis not present

## 2017-12-06 DIAGNOSIS — D631 Anemia in chronic kidney disease: Secondary | ICD-10-CM | POA: Diagnosis not present

## 2017-12-06 DIAGNOSIS — Z4932 Encounter for adequacy testing for peritoneal dialysis: Secondary | ICD-10-CM | POA: Diagnosis not present

## 2017-12-06 DIAGNOSIS — D509 Iron deficiency anemia, unspecified: Secondary | ICD-10-CM | POA: Diagnosis not present

## 2017-12-06 DIAGNOSIS — N186 End stage renal disease: Secondary | ICD-10-CM | POA: Diagnosis not present

## 2017-12-07 DIAGNOSIS — D509 Iron deficiency anemia, unspecified: Secondary | ICD-10-CM | POA: Diagnosis not present

## 2017-12-07 DIAGNOSIS — N186 End stage renal disease: Secondary | ICD-10-CM | POA: Diagnosis not present

## 2017-12-07 DIAGNOSIS — N2581 Secondary hyperparathyroidism of renal origin: Secondary | ICD-10-CM | POA: Diagnosis not present

## 2017-12-07 DIAGNOSIS — D631 Anemia in chronic kidney disease: Secondary | ICD-10-CM | POA: Diagnosis not present

## 2017-12-08 DIAGNOSIS — N2581 Secondary hyperparathyroidism of renal origin: Secondary | ICD-10-CM | POA: Diagnosis not present

## 2017-12-08 DIAGNOSIS — N186 End stage renal disease: Secondary | ICD-10-CM | POA: Diagnosis not present

## 2017-12-08 DIAGNOSIS — D509 Iron deficiency anemia, unspecified: Secondary | ICD-10-CM | POA: Diagnosis not present

## 2017-12-08 DIAGNOSIS — D631 Anemia in chronic kidney disease: Secondary | ICD-10-CM | POA: Diagnosis not present

## 2017-12-09 DIAGNOSIS — N186 End stage renal disease: Secondary | ICD-10-CM | POA: Diagnosis not present

## 2017-12-09 DIAGNOSIS — N2581 Secondary hyperparathyroidism of renal origin: Secondary | ICD-10-CM | POA: Diagnosis not present

## 2017-12-09 DIAGNOSIS — D509 Iron deficiency anemia, unspecified: Secondary | ICD-10-CM | POA: Diagnosis not present

## 2017-12-09 DIAGNOSIS — D631 Anemia in chronic kidney disease: Secondary | ICD-10-CM | POA: Diagnosis not present

## 2017-12-10 DIAGNOSIS — N186 End stage renal disease: Secondary | ICD-10-CM | POA: Diagnosis not present

## 2017-12-10 DIAGNOSIS — N2581 Secondary hyperparathyroidism of renal origin: Secondary | ICD-10-CM | POA: Diagnosis not present

## 2017-12-10 DIAGNOSIS — D631 Anemia in chronic kidney disease: Secondary | ICD-10-CM | POA: Diagnosis not present

## 2017-12-10 DIAGNOSIS — D509 Iron deficiency anemia, unspecified: Secondary | ICD-10-CM | POA: Diagnosis not present

## 2017-12-11 DIAGNOSIS — D509 Iron deficiency anemia, unspecified: Secondary | ICD-10-CM | POA: Diagnosis not present

## 2017-12-11 DIAGNOSIS — D631 Anemia in chronic kidney disease: Secondary | ICD-10-CM | POA: Diagnosis not present

## 2017-12-11 DIAGNOSIS — N2581 Secondary hyperparathyroidism of renal origin: Secondary | ICD-10-CM | POA: Diagnosis not present

## 2017-12-11 DIAGNOSIS — N186 End stage renal disease: Secondary | ICD-10-CM | POA: Diagnosis not present

## 2017-12-12 DIAGNOSIS — D509 Iron deficiency anemia, unspecified: Secondary | ICD-10-CM | POA: Diagnosis not present

## 2017-12-12 DIAGNOSIS — D631 Anemia in chronic kidney disease: Secondary | ICD-10-CM | POA: Diagnosis not present

## 2017-12-12 DIAGNOSIS — N2581 Secondary hyperparathyroidism of renal origin: Secondary | ICD-10-CM | POA: Diagnosis not present

## 2017-12-12 DIAGNOSIS — N186 End stage renal disease: Secondary | ICD-10-CM | POA: Diagnosis not present

## 2017-12-13 DIAGNOSIS — N2581 Secondary hyperparathyroidism of renal origin: Secondary | ICD-10-CM | POA: Diagnosis not present

## 2017-12-13 DIAGNOSIS — D631 Anemia in chronic kidney disease: Secondary | ICD-10-CM | POA: Diagnosis not present

## 2017-12-13 DIAGNOSIS — D509 Iron deficiency anemia, unspecified: Secondary | ICD-10-CM | POA: Diagnosis not present

## 2017-12-13 DIAGNOSIS — N186 End stage renal disease: Secondary | ICD-10-CM | POA: Diagnosis not present

## 2017-12-14 DIAGNOSIS — N2581 Secondary hyperparathyroidism of renal origin: Secondary | ICD-10-CM | POA: Diagnosis not present

## 2017-12-14 DIAGNOSIS — N186 End stage renal disease: Secondary | ICD-10-CM | POA: Diagnosis not present

## 2017-12-14 DIAGNOSIS — D509 Iron deficiency anemia, unspecified: Secondary | ICD-10-CM | POA: Diagnosis not present

## 2017-12-14 DIAGNOSIS — D631 Anemia in chronic kidney disease: Secondary | ICD-10-CM | POA: Diagnosis not present

## 2017-12-15 DIAGNOSIS — D509 Iron deficiency anemia, unspecified: Secondary | ICD-10-CM | POA: Diagnosis not present

## 2017-12-15 DIAGNOSIS — N186 End stage renal disease: Secondary | ICD-10-CM | POA: Diagnosis not present

## 2017-12-15 DIAGNOSIS — D631 Anemia in chronic kidney disease: Secondary | ICD-10-CM | POA: Diagnosis not present

## 2017-12-15 DIAGNOSIS — N2581 Secondary hyperparathyroidism of renal origin: Secondary | ICD-10-CM | POA: Diagnosis not present

## 2017-12-16 DIAGNOSIS — D631 Anemia in chronic kidney disease: Secondary | ICD-10-CM | POA: Diagnosis not present

## 2017-12-16 DIAGNOSIS — D509 Iron deficiency anemia, unspecified: Secondary | ICD-10-CM | POA: Diagnosis not present

## 2017-12-16 DIAGNOSIS — N186 End stage renal disease: Secondary | ICD-10-CM | POA: Diagnosis not present

## 2017-12-16 DIAGNOSIS — N2581 Secondary hyperparathyroidism of renal origin: Secondary | ICD-10-CM | POA: Diagnosis not present

## 2017-12-17 DIAGNOSIS — D631 Anemia in chronic kidney disease: Secondary | ICD-10-CM | POA: Diagnosis not present

## 2017-12-17 DIAGNOSIS — N2581 Secondary hyperparathyroidism of renal origin: Secondary | ICD-10-CM | POA: Diagnosis not present

## 2017-12-17 DIAGNOSIS — N186 End stage renal disease: Secondary | ICD-10-CM | POA: Diagnosis not present

## 2017-12-17 DIAGNOSIS — D509 Iron deficiency anemia, unspecified: Secondary | ICD-10-CM | POA: Diagnosis not present

## 2017-12-18 DIAGNOSIS — N2581 Secondary hyperparathyroidism of renal origin: Secondary | ICD-10-CM | POA: Diagnosis not present

## 2017-12-18 DIAGNOSIS — D509 Iron deficiency anemia, unspecified: Secondary | ICD-10-CM | POA: Diagnosis not present

## 2017-12-18 DIAGNOSIS — N186 End stage renal disease: Secondary | ICD-10-CM | POA: Diagnosis not present

## 2017-12-18 DIAGNOSIS — D631 Anemia in chronic kidney disease: Secondary | ICD-10-CM | POA: Diagnosis not present

## 2017-12-19 DIAGNOSIS — N186 End stage renal disease: Secondary | ICD-10-CM | POA: Diagnosis not present

## 2017-12-19 DIAGNOSIS — D631 Anemia in chronic kidney disease: Secondary | ICD-10-CM | POA: Diagnosis not present

## 2017-12-19 DIAGNOSIS — D509 Iron deficiency anemia, unspecified: Secondary | ICD-10-CM | POA: Diagnosis not present

## 2017-12-19 DIAGNOSIS — N2581 Secondary hyperparathyroidism of renal origin: Secondary | ICD-10-CM | POA: Diagnosis not present

## 2017-12-20 DIAGNOSIS — D509 Iron deficiency anemia, unspecified: Secondary | ICD-10-CM | POA: Diagnosis not present

## 2017-12-20 DIAGNOSIS — N186 End stage renal disease: Secondary | ICD-10-CM | POA: Diagnosis not present

## 2017-12-20 DIAGNOSIS — N2581 Secondary hyperparathyroidism of renal origin: Secondary | ICD-10-CM | POA: Diagnosis not present

## 2017-12-20 DIAGNOSIS — D631 Anemia in chronic kidney disease: Secondary | ICD-10-CM | POA: Diagnosis not present

## 2017-12-21 DIAGNOSIS — D631 Anemia in chronic kidney disease: Secondary | ICD-10-CM | POA: Diagnosis not present

## 2017-12-21 DIAGNOSIS — D509 Iron deficiency anemia, unspecified: Secondary | ICD-10-CM | POA: Diagnosis not present

## 2017-12-21 DIAGNOSIS — N2581 Secondary hyperparathyroidism of renal origin: Secondary | ICD-10-CM | POA: Diagnosis not present

## 2017-12-21 DIAGNOSIS — N186 End stage renal disease: Secondary | ICD-10-CM | POA: Diagnosis not present

## 2017-12-22 DIAGNOSIS — D509 Iron deficiency anemia, unspecified: Secondary | ICD-10-CM | POA: Diagnosis not present

## 2017-12-22 DIAGNOSIS — N186 End stage renal disease: Secondary | ICD-10-CM | POA: Diagnosis not present

## 2017-12-22 DIAGNOSIS — N2581 Secondary hyperparathyroidism of renal origin: Secondary | ICD-10-CM | POA: Diagnosis not present

## 2017-12-22 DIAGNOSIS — D631 Anemia in chronic kidney disease: Secondary | ICD-10-CM | POA: Diagnosis not present

## 2017-12-23 DIAGNOSIS — D631 Anemia in chronic kidney disease: Secondary | ICD-10-CM | POA: Diagnosis not present

## 2017-12-23 DIAGNOSIS — N2581 Secondary hyperparathyroidism of renal origin: Secondary | ICD-10-CM | POA: Diagnosis not present

## 2017-12-23 DIAGNOSIS — D509 Iron deficiency anemia, unspecified: Secondary | ICD-10-CM | POA: Diagnosis not present

## 2017-12-23 DIAGNOSIS — N186 End stage renal disease: Secondary | ICD-10-CM | POA: Diagnosis not present

## 2017-12-23 DIAGNOSIS — Z992 Dependence on renal dialysis: Secondary | ICD-10-CM | POA: Diagnosis not present

## 2017-12-24 DIAGNOSIS — N186 End stage renal disease: Secondary | ICD-10-CM | POA: Diagnosis not present

## 2017-12-24 DIAGNOSIS — D631 Anemia in chronic kidney disease: Secondary | ICD-10-CM | POA: Diagnosis not present

## 2017-12-24 DIAGNOSIS — N2581 Secondary hyperparathyroidism of renal origin: Secondary | ICD-10-CM | POA: Diagnosis not present

## 2017-12-24 DIAGNOSIS — Z23 Encounter for immunization: Secondary | ICD-10-CM | POA: Diagnosis not present

## 2017-12-24 DIAGNOSIS — D509 Iron deficiency anemia, unspecified: Secondary | ICD-10-CM | POA: Diagnosis not present

## 2017-12-25 DIAGNOSIS — N186 End stage renal disease: Secondary | ICD-10-CM | POA: Diagnosis not present

## 2017-12-25 DIAGNOSIS — Z23 Encounter for immunization: Secondary | ICD-10-CM | POA: Diagnosis not present

## 2017-12-25 DIAGNOSIS — N2581 Secondary hyperparathyroidism of renal origin: Secondary | ICD-10-CM | POA: Diagnosis not present

## 2017-12-25 DIAGNOSIS — D509 Iron deficiency anemia, unspecified: Secondary | ICD-10-CM | POA: Diagnosis not present

## 2017-12-25 DIAGNOSIS — D631 Anemia in chronic kidney disease: Secondary | ICD-10-CM | POA: Diagnosis not present

## 2017-12-26 DIAGNOSIS — D631 Anemia in chronic kidney disease: Secondary | ICD-10-CM | POA: Diagnosis not present

## 2017-12-26 DIAGNOSIS — N2581 Secondary hyperparathyroidism of renal origin: Secondary | ICD-10-CM | POA: Diagnosis not present

## 2017-12-26 DIAGNOSIS — D509 Iron deficiency anemia, unspecified: Secondary | ICD-10-CM | POA: Diagnosis not present

## 2017-12-26 DIAGNOSIS — N186 End stage renal disease: Secondary | ICD-10-CM | POA: Diagnosis not present

## 2017-12-26 DIAGNOSIS — Z23 Encounter for immunization: Secondary | ICD-10-CM | POA: Diagnosis not present

## 2017-12-27 DIAGNOSIS — E119 Type 2 diabetes mellitus without complications: Secondary | ICD-10-CM | POA: Diagnosis not present

## 2017-12-27 DIAGNOSIS — N2581 Secondary hyperparathyroidism of renal origin: Secondary | ICD-10-CM | POA: Diagnosis not present

## 2017-12-27 DIAGNOSIS — N186 End stage renal disease: Secondary | ICD-10-CM | POA: Diagnosis not present

## 2017-12-27 DIAGNOSIS — Z418 Encounter for other procedures for purposes other than remedying health state: Secondary | ICD-10-CM | POA: Diagnosis not present

## 2017-12-27 DIAGNOSIS — Z23 Encounter for immunization: Secondary | ICD-10-CM | POA: Diagnosis not present

## 2017-12-27 DIAGNOSIS — D631 Anemia in chronic kidney disease: Secondary | ICD-10-CM | POA: Diagnosis not present

## 2017-12-27 DIAGNOSIS — D509 Iron deficiency anemia, unspecified: Secondary | ICD-10-CM | POA: Diagnosis not present

## 2017-12-28 DIAGNOSIS — D509 Iron deficiency anemia, unspecified: Secondary | ICD-10-CM | POA: Diagnosis not present

## 2017-12-28 DIAGNOSIS — N186 End stage renal disease: Secondary | ICD-10-CM | POA: Diagnosis not present

## 2017-12-28 DIAGNOSIS — D631 Anemia in chronic kidney disease: Secondary | ICD-10-CM | POA: Diagnosis not present

## 2017-12-28 DIAGNOSIS — N2581 Secondary hyperparathyroidism of renal origin: Secondary | ICD-10-CM | POA: Diagnosis not present

## 2017-12-28 DIAGNOSIS — Z23 Encounter for immunization: Secondary | ICD-10-CM | POA: Diagnosis not present

## 2017-12-29 DIAGNOSIS — D509 Iron deficiency anemia, unspecified: Secondary | ICD-10-CM | POA: Diagnosis not present

## 2017-12-29 DIAGNOSIS — D631 Anemia in chronic kidney disease: Secondary | ICD-10-CM | POA: Diagnosis not present

## 2017-12-29 DIAGNOSIS — N2581 Secondary hyperparathyroidism of renal origin: Secondary | ICD-10-CM | POA: Diagnosis not present

## 2017-12-29 DIAGNOSIS — N186 End stage renal disease: Secondary | ICD-10-CM | POA: Diagnosis not present

## 2017-12-29 DIAGNOSIS — Z23 Encounter for immunization: Secondary | ICD-10-CM | POA: Diagnosis not present

## 2017-12-30 DIAGNOSIS — D631 Anemia in chronic kidney disease: Secondary | ICD-10-CM | POA: Diagnosis not present

## 2017-12-30 DIAGNOSIS — N2581 Secondary hyperparathyroidism of renal origin: Secondary | ICD-10-CM | POA: Diagnosis not present

## 2017-12-30 DIAGNOSIS — Z23 Encounter for immunization: Secondary | ICD-10-CM | POA: Diagnosis not present

## 2017-12-30 DIAGNOSIS — D509 Iron deficiency anemia, unspecified: Secondary | ICD-10-CM | POA: Diagnosis not present

## 2017-12-30 DIAGNOSIS — N186 End stage renal disease: Secondary | ICD-10-CM | POA: Diagnosis not present

## 2017-12-31 DIAGNOSIS — N2581 Secondary hyperparathyroidism of renal origin: Secondary | ICD-10-CM | POA: Diagnosis not present

## 2017-12-31 DIAGNOSIS — N186 End stage renal disease: Secondary | ICD-10-CM | POA: Diagnosis not present

## 2017-12-31 DIAGNOSIS — D509 Iron deficiency anemia, unspecified: Secondary | ICD-10-CM | POA: Diagnosis not present

## 2017-12-31 DIAGNOSIS — Z23 Encounter for immunization: Secondary | ICD-10-CM | POA: Diagnosis not present

## 2017-12-31 DIAGNOSIS — D631 Anemia in chronic kidney disease: Secondary | ICD-10-CM | POA: Diagnosis not present

## 2018-01-01 DIAGNOSIS — Z23 Encounter for immunization: Secondary | ICD-10-CM | POA: Diagnosis not present

## 2018-01-01 DIAGNOSIS — N2581 Secondary hyperparathyroidism of renal origin: Secondary | ICD-10-CM | POA: Diagnosis not present

## 2018-01-01 DIAGNOSIS — N186 End stage renal disease: Secondary | ICD-10-CM | POA: Diagnosis not present

## 2018-01-01 DIAGNOSIS — D631 Anemia in chronic kidney disease: Secondary | ICD-10-CM | POA: Diagnosis not present

## 2018-01-01 DIAGNOSIS — D509 Iron deficiency anemia, unspecified: Secondary | ICD-10-CM | POA: Diagnosis not present

## 2018-01-02 DIAGNOSIS — Z23 Encounter for immunization: Secondary | ICD-10-CM | POA: Diagnosis not present

## 2018-01-02 DIAGNOSIS — N2581 Secondary hyperparathyroidism of renal origin: Secondary | ICD-10-CM | POA: Diagnosis not present

## 2018-01-02 DIAGNOSIS — D509 Iron deficiency anemia, unspecified: Secondary | ICD-10-CM | POA: Diagnosis not present

## 2018-01-02 DIAGNOSIS — N186 End stage renal disease: Secondary | ICD-10-CM | POA: Diagnosis not present

## 2018-01-02 DIAGNOSIS — D631 Anemia in chronic kidney disease: Secondary | ICD-10-CM | POA: Diagnosis not present

## 2018-01-03 DIAGNOSIS — D631 Anemia in chronic kidney disease: Secondary | ICD-10-CM | POA: Diagnosis not present

## 2018-01-03 DIAGNOSIS — D509 Iron deficiency anemia, unspecified: Secondary | ICD-10-CM | POA: Diagnosis not present

## 2018-01-03 DIAGNOSIS — Z23 Encounter for immunization: Secondary | ICD-10-CM | POA: Diagnosis not present

## 2018-01-03 DIAGNOSIS — N186 End stage renal disease: Secondary | ICD-10-CM | POA: Diagnosis not present

## 2018-01-03 DIAGNOSIS — N2581 Secondary hyperparathyroidism of renal origin: Secondary | ICD-10-CM | POA: Diagnosis not present

## 2018-01-04 DIAGNOSIS — Z23 Encounter for immunization: Secondary | ICD-10-CM | POA: Diagnosis not present

## 2018-01-04 DIAGNOSIS — D509 Iron deficiency anemia, unspecified: Secondary | ICD-10-CM | POA: Diagnosis not present

## 2018-01-04 DIAGNOSIS — D631 Anemia in chronic kidney disease: Secondary | ICD-10-CM | POA: Diagnosis not present

## 2018-01-04 DIAGNOSIS — N186 End stage renal disease: Secondary | ICD-10-CM | POA: Diagnosis not present

## 2018-01-04 DIAGNOSIS — N2581 Secondary hyperparathyroidism of renal origin: Secondary | ICD-10-CM | POA: Diagnosis not present

## 2018-01-05 DIAGNOSIS — D631 Anemia in chronic kidney disease: Secondary | ICD-10-CM | POA: Diagnosis not present

## 2018-01-05 DIAGNOSIS — N186 End stage renal disease: Secondary | ICD-10-CM | POA: Diagnosis not present

## 2018-01-05 DIAGNOSIS — Z23 Encounter for immunization: Secondary | ICD-10-CM | POA: Diagnosis not present

## 2018-01-05 DIAGNOSIS — D509 Iron deficiency anemia, unspecified: Secondary | ICD-10-CM | POA: Diagnosis not present

## 2018-01-05 DIAGNOSIS — N2581 Secondary hyperparathyroidism of renal origin: Secondary | ICD-10-CM | POA: Diagnosis not present

## 2018-01-06 DIAGNOSIS — N2581 Secondary hyperparathyroidism of renal origin: Secondary | ICD-10-CM | POA: Diagnosis not present

## 2018-01-06 DIAGNOSIS — Z23 Encounter for immunization: Secondary | ICD-10-CM | POA: Diagnosis not present

## 2018-01-06 DIAGNOSIS — N186 End stage renal disease: Secondary | ICD-10-CM | POA: Diagnosis not present

## 2018-01-06 DIAGNOSIS — D509 Iron deficiency anemia, unspecified: Secondary | ICD-10-CM | POA: Diagnosis not present

## 2018-01-06 DIAGNOSIS — D631 Anemia in chronic kidney disease: Secondary | ICD-10-CM | POA: Diagnosis not present

## 2018-01-07 DIAGNOSIS — D631 Anemia in chronic kidney disease: Secondary | ICD-10-CM | POA: Diagnosis not present

## 2018-01-07 DIAGNOSIS — N2581 Secondary hyperparathyroidism of renal origin: Secondary | ICD-10-CM | POA: Diagnosis not present

## 2018-01-07 DIAGNOSIS — D509 Iron deficiency anemia, unspecified: Secondary | ICD-10-CM | POA: Diagnosis not present

## 2018-01-07 DIAGNOSIS — Z23 Encounter for immunization: Secondary | ICD-10-CM | POA: Diagnosis not present

## 2018-01-07 DIAGNOSIS — N186 End stage renal disease: Secondary | ICD-10-CM | POA: Diagnosis not present

## 2018-01-08 DIAGNOSIS — D509 Iron deficiency anemia, unspecified: Secondary | ICD-10-CM | POA: Diagnosis not present

## 2018-01-08 DIAGNOSIS — D631 Anemia in chronic kidney disease: Secondary | ICD-10-CM | POA: Diagnosis not present

## 2018-01-08 DIAGNOSIS — N186 End stage renal disease: Secondary | ICD-10-CM | POA: Diagnosis not present

## 2018-01-08 DIAGNOSIS — Z23 Encounter for immunization: Secondary | ICD-10-CM | POA: Diagnosis not present

## 2018-01-08 DIAGNOSIS — N2581 Secondary hyperparathyroidism of renal origin: Secondary | ICD-10-CM | POA: Diagnosis not present

## 2018-01-09 DIAGNOSIS — D509 Iron deficiency anemia, unspecified: Secondary | ICD-10-CM | POA: Diagnosis not present

## 2018-01-09 DIAGNOSIS — N186 End stage renal disease: Secondary | ICD-10-CM | POA: Diagnosis not present

## 2018-01-09 DIAGNOSIS — D631 Anemia in chronic kidney disease: Secondary | ICD-10-CM | POA: Diagnosis not present

## 2018-01-09 DIAGNOSIS — Z23 Encounter for immunization: Secondary | ICD-10-CM | POA: Diagnosis not present

## 2018-01-09 DIAGNOSIS — N2581 Secondary hyperparathyroidism of renal origin: Secondary | ICD-10-CM | POA: Diagnosis not present

## 2018-01-10 DIAGNOSIS — N186 End stage renal disease: Secondary | ICD-10-CM | POA: Diagnosis not present

## 2018-01-10 DIAGNOSIS — N2581 Secondary hyperparathyroidism of renal origin: Secondary | ICD-10-CM | POA: Diagnosis not present

## 2018-01-10 DIAGNOSIS — D509 Iron deficiency anemia, unspecified: Secondary | ICD-10-CM | POA: Diagnosis not present

## 2018-01-10 DIAGNOSIS — D631 Anemia in chronic kidney disease: Secondary | ICD-10-CM | POA: Diagnosis not present

## 2018-01-10 DIAGNOSIS — Z23 Encounter for immunization: Secondary | ICD-10-CM | POA: Diagnosis not present

## 2018-01-11 DIAGNOSIS — N186 End stage renal disease: Secondary | ICD-10-CM | POA: Diagnosis not present

## 2018-01-11 DIAGNOSIS — Z23 Encounter for immunization: Secondary | ICD-10-CM | POA: Diagnosis not present

## 2018-01-11 DIAGNOSIS — D631 Anemia in chronic kidney disease: Secondary | ICD-10-CM | POA: Diagnosis not present

## 2018-01-11 DIAGNOSIS — D509 Iron deficiency anemia, unspecified: Secondary | ICD-10-CM | POA: Diagnosis not present

## 2018-01-11 DIAGNOSIS — N2581 Secondary hyperparathyroidism of renal origin: Secondary | ICD-10-CM | POA: Diagnosis not present

## 2018-01-12 DIAGNOSIS — D509 Iron deficiency anemia, unspecified: Secondary | ICD-10-CM | POA: Diagnosis not present

## 2018-01-12 DIAGNOSIS — Z23 Encounter for immunization: Secondary | ICD-10-CM | POA: Diagnosis not present

## 2018-01-12 DIAGNOSIS — N2581 Secondary hyperparathyroidism of renal origin: Secondary | ICD-10-CM | POA: Diagnosis not present

## 2018-01-12 DIAGNOSIS — N186 End stage renal disease: Secondary | ICD-10-CM | POA: Diagnosis not present

## 2018-01-12 DIAGNOSIS — D631 Anemia in chronic kidney disease: Secondary | ICD-10-CM | POA: Diagnosis not present

## 2018-01-13 DIAGNOSIS — N2581 Secondary hyperparathyroidism of renal origin: Secondary | ICD-10-CM | POA: Diagnosis not present

## 2018-01-13 DIAGNOSIS — N186 End stage renal disease: Secondary | ICD-10-CM | POA: Diagnosis not present

## 2018-01-13 DIAGNOSIS — D509 Iron deficiency anemia, unspecified: Secondary | ICD-10-CM | POA: Diagnosis not present

## 2018-01-13 DIAGNOSIS — Z23 Encounter for immunization: Secondary | ICD-10-CM | POA: Diagnosis not present

## 2018-01-13 DIAGNOSIS — D631 Anemia in chronic kidney disease: Secondary | ICD-10-CM | POA: Diagnosis not present

## 2018-01-14 DIAGNOSIS — D631 Anemia in chronic kidney disease: Secondary | ICD-10-CM | POA: Diagnosis not present

## 2018-01-14 DIAGNOSIS — N186 End stage renal disease: Secondary | ICD-10-CM | POA: Diagnosis not present

## 2018-01-14 DIAGNOSIS — N2581 Secondary hyperparathyroidism of renal origin: Secondary | ICD-10-CM | POA: Diagnosis not present

## 2018-01-14 DIAGNOSIS — Z23 Encounter for immunization: Secondary | ICD-10-CM | POA: Diagnosis not present

## 2018-01-14 DIAGNOSIS — D509 Iron deficiency anemia, unspecified: Secondary | ICD-10-CM | POA: Diagnosis not present

## 2018-01-15 DIAGNOSIS — Z23 Encounter for immunization: Secondary | ICD-10-CM | POA: Diagnosis not present

## 2018-01-15 DIAGNOSIS — D631 Anemia in chronic kidney disease: Secondary | ICD-10-CM | POA: Diagnosis not present

## 2018-01-15 DIAGNOSIS — N2581 Secondary hyperparathyroidism of renal origin: Secondary | ICD-10-CM | POA: Diagnosis not present

## 2018-01-15 DIAGNOSIS — D509 Iron deficiency anemia, unspecified: Secondary | ICD-10-CM | POA: Diagnosis not present

## 2018-01-15 DIAGNOSIS — N186 End stage renal disease: Secondary | ICD-10-CM | POA: Diagnosis not present

## 2018-01-16 DIAGNOSIS — Z4932 Encounter for adequacy testing for peritoneal dialysis: Secondary | ICD-10-CM | POA: Diagnosis not present

## 2018-01-16 DIAGNOSIS — D509 Iron deficiency anemia, unspecified: Secondary | ICD-10-CM | POA: Diagnosis not present

## 2018-01-16 DIAGNOSIS — D631 Anemia in chronic kidney disease: Secondary | ICD-10-CM | POA: Diagnosis not present

## 2018-01-16 DIAGNOSIS — N2581 Secondary hyperparathyroidism of renal origin: Secondary | ICD-10-CM | POA: Diagnosis not present

## 2018-01-16 DIAGNOSIS — Z23 Encounter for immunization: Secondary | ICD-10-CM | POA: Diagnosis not present

## 2018-01-16 DIAGNOSIS — N186 End stage renal disease: Secondary | ICD-10-CM | POA: Diagnosis not present

## 2018-01-17 DIAGNOSIS — N2581 Secondary hyperparathyroidism of renal origin: Secondary | ICD-10-CM | POA: Diagnosis not present

## 2018-01-17 DIAGNOSIS — Z23 Encounter for immunization: Secondary | ICD-10-CM | POA: Diagnosis not present

## 2018-01-17 DIAGNOSIS — N186 End stage renal disease: Secondary | ICD-10-CM | POA: Diagnosis not present

## 2018-01-17 DIAGNOSIS — D631 Anemia in chronic kidney disease: Secondary | ICD-10-CM | POA: Diagnosis not present

## 2018-01-17 DIAGNOSIS — D509 Iron deficiency anemia, unspecified: Secondary | ICD-10-CM | POA: Diagnosis not present

## 2018-01-18 DIAGNOSIS — D509 Iron deficiency anemia, unspecified: Secondary | ICD-10-CM | POA: Diagnosis not present

## 2018-01-18 DIAGNOSIS — Z23 Encounter for immunization: Secondary | ICD-10-CM | POA: Diagnosis not present

## 2018-01-18 DIAGNOSIS — N186 End stage renal disease: Secondary | ICD-10-CM | POA: Diagnosis not present

## 2018-01-18 DIAGNOSIS — N2581 Secondary hyperparathyroidism of renal origin: Secondary | ICD-10-CM | POA: Diagnosis not present

## 2018-01-18 DIAGNOSIS — D631 Anemia in chronic kidney disease: Secondary | ICD-10-CM | POA: Diagnosis not present

## 2018-01-19 DIAGNOSIS — N186 End stage renal disease: Secondary | ICD-10-CM | POA: Diagnosis not present

## 2018-01-19 DIAGNOSIS — D631 Anemia in chronic kidney disease: Secondary | ICD-10-CM | POA: Diagnosis not present

## 2018-01-19 DIAGNOSIS — Z23 Encounter for immunization: Secondary | ICD-10-CM | POA: Diagnosis not present

## 2018-01-19 DIAGNOSIS — N2581 Secondary hyperparathyroidism of renal origin: Secondary | ICD-10-CM | POA: Diagnosis not present

## 2018-01-19 DIAGNOSIS — D509 Iron deficiency anemia, unspecified: Secondary | ICD-10-CM | POA: Diagnosis not present

## 2018-01-20 DIAGNOSIS — N186 End stage renal disease: Secondary | ICD-10-CM | POA: Diagnosis not present

## 2018-01-20 DIAGNOSIS — D631 Anemia in chronic kidney disease: Secondary | ICD-10-CM | POA: Diagnosis not present

## 2018-01-20 DIAGNOSIS — Z23 Encounter for immunization: Secondary | ICD-10-CM | POA: Diagnosis not present

## 2018-01-20 DIAGNOSIS — D509 Iron deficiency anemia, unspecified: Secondary | ICD-10-CM | POA: Diagnosis not present

## 2018-01-20 DIAGNOSIS — N2581 Secondary hyperparathyroidism of renal origin: Secondary | ICD-10-CM | POA: Diagnosis not present

## 2018-01-21 DIAGNOSIS — Z23 Encounter for immunization: Secondary | ICD-10-CM | POA: Diagnosis not present

## 2018-01-21 DIAGNOSIS — N2581 Secondary hyperparathyroidism of renal origin: Secondary | ICD-10-CM | POA: Diagnosis not present

## 2018-01-21 DIAGNOSIS — D631 Anemia in chronic kidney disease: Secondary | ICD-10-CM | POA: Diagnosis not present

## 2018-01-21 DIAGNOSIS — N186 End stage renal disease: Secondary | ICD-10-CM | POA: Diagnosis not present

## 2018-01-21 DIAGNOSIS — D509 Iron deficiency anemia, unspecified: Secondary | ICD-10-CM | POA: Diagnosis not present

## 2018-01-22 DIAGNOSIS — D631 Anemia in chronic kidney disease: Secondary | ICD-10-CM | POA: Diagnosis not present

## 2018-01-22 DIAGNOSIS — N2581 Secondary hyperparathyroidism of renal origin: Secondary | ICD-10-CM | POA: Diagnosis not present

## 2018-01-22 DIAGNOSIS — N186 End stage renal disease: Secondary | ICD-10-CM | POA: Diagnosis not present

## 2018-01-22 DIAGNOSIS — D509 Iron deficiency anemia, unspecified: Secondary | ICD-10-CM | POA: Diagnosis not present

## 2018-01-22 DIAGNOSIS — Z23 Encounter for immunization: Secondary | ICD-10-CM | POA: Diagnosis not present

## 2018-01-23 DIAGNOSIS — N186 End stage renal disease: Secondary | ICD-10-CM | POA: Diagnosis not present

## 2018-01-23 DIAGNOSIS — N2581 Secondary hyperparathyroidism of renal origin: Secondary | ICD-10-CM | POA: Diagnosis not present

## 2018-01-23 DIAGNOSIS — Z992 Dependence on renal dialysis: Secondary | ICD-10-CM | POA: Diagnosis not present

## 2018-01-23 DIAGNOSIS — D631 Anemia in chronic kidney disease: Secondary | ICD-10-CM | POA: Diagnosis not present

## 2018-01-23 DIAGNOSIS — D509 Iron deficiency anemia, unspecified: Secondary | ICD-10-CM | POA: Diagnosis not present

## 2018-01-23 DIAGNOSIS — Z23 Encounter for immunization: Secondary | ICD-10-CM | POA: Diagnosis not present

## 2018-01-24 DIAGNOSIS — D631 Anemia in chronic kidney disease: Secondary | ICD-10-CM | POA: Diagnosis not present

## 2018-01-24 DIAGNOSIS — N186 End stage renal disease: Secondary | ICD-10-CM | POA: Diagnosis not present

## 2018-01-24 DIAGNOSIS — N2581 Secondary hyperparathyroidism of renal origin: Secondary | ICD-10-CM | POA: Diagnosis not present

## 2018-01-25 DIAGNOSIS — N186 End stage renal disease: Secondary | ICD-10-CM | POA: Diagnosis not present

## 2018-01-25 DIAGNOSIS — N2581 Secondary hyperparathyroidism of renal origin: Secondary | ICD-10-CM | POA: Diagnosis not present

## 2018-01-25 DIAGNOSIS — D631 Anemia in chronic kidney disease: Secondary | ICD-10-CM | POA: Diagnosis not present

## 2018-01-26 DIAGNOSIS — D631 Anemia in chronic kidney disease: Secondary | ICD-10-CM | POA: Diagnosis not present

## 2018-01-26 DIAGNOSIS — N186 End stage renal disease: Secondary | ICD-10-CM | POA: Diagnosis not present

## 2018-01-26 DIAGNOSIS — N2581 Secondary hyperparathyroidism of renal origin: Secondary | ICD-10-CM | POA: Diagnosis not present

## 2018-01-27 DIAGNOSIS — D631 Anemia in chronic kidney disease: Secondary | ICD-10-CM | POA: Diagnosis not present

## 2018-01-27 DIAGNOSIS — N2581 Secondary hyperparathyroidism of renal origin: Secondary | ICD-10-CM | POA: Diagnosis not present

## 2018-01-27 DIAGNOSIS — N186 End stage renal disease: Secondary | ICD-10-CM | POA: Diagnosis not present

## 2018-01-28 DIAGNOSIS — D631 Anemia in chronic kidney disease: Secondary | ICD-10-CM | POA: Diagnosis not present

## 2018-01-28 DIAGNOSIS — N2581 Secondary hyperparathyroidism of renal origin: Secondary | ICD-10-CM | POA: Diagnosis not present

## 2018-01-28 DIAGNOSIS — N186 End stage renal disease: Secondary | ICD-10-CM | POA: Diagnosis not present

## 2018-01-29 DIAGNOSIS — N2581 Secondary hyperparathyroidism of renal origin: Secondary | ICD-10-CM | POA: Diagnosis not present

## 2018-01-29 DIAGNOSIS — N186 End stage renal disease: Secondary | ICD-10-CM | POA: Diagnosis not present

## 2018-01-29 DIAGNOSIS — D631 Anemia in chronic kidney disease: Secondary | ICD-10-CM | POA: Diagnosis not present

## 2018-01-30 DIAGNOSIS — N186 End stage renal disease: Secondary | ICD-10-CM | POA: Diagnosis not present

## 2018-01-30 DIAGNOSIS — D631 Anemia in chronic kidney disease: Secondary | ICD-10-CM | POA: Diagnosis not present

## 2018-01-30 DIAGNOSIS — N2581 Secondary hyperparathyroidism of renal origin: Secondary | ICD-10-CM | POA: Diagnosis not present

## 2018-01-31 DIAGNOSIS — N2581 Secondary hyperparathyroidism of renal origin: Secondary | ICD-10-CM | POA: Diagnosis not present

## 2018-01-31 DIAGNOSIS — D631 Anemia in chronic kidney disease: Secondary | ICD-10-CM | POA: Diagnosis not present

## 2018-01-31 DIAGNOSIS — N186 End stage renal disease: Secondary | ICD-10-CM | POA: Diagnosis not present

## 2018-01-31 DIAGNOSIS — E119 Type 2 diabetes mellitus without complications: Secondary | ICD-10-CM | POA: Diagnosis not present

## 2018-02-01 DIAGNOSIS — N186 End stage renal disease: Secondary | ICD-10-CM | POA: Diagnosis not present

## 2018-02-01 DIAGNOSIS — N2581 Secondary hyperparathyroidism of renal origin: Secondary | ICD-10-CM | POA: Diagnosis not present

## 2018-02-01 DIAGNOSIS — D631 Anemia in chronic kidney disease: Secondary | ICD-10-CM | POA: Diagnosis not present

## 2018-02-02 DIAGNOSIS — N2581 Secondary hyperparathyroidism of renal origin: Secondary | ICD-10-CM | POA: Diagnosis not present

## 2018-02-02 DIAGNOSIS — N186 End stage renal disease: Secondary | ICD-10-CM | POA: Diagnosis not present

## 2018-02-02 DIAGNOSIS — D631 Anemia in chronic kidney disease: Secondary | ICD-10-CM | POA: Diagnosis not present

## 2018-02-03 DIAGNOSIS — N186 End stage renal disease: Secondary | ICD-10-CM | POA: Diagnosis not present

## 2018-02-03 DIAGNOSIS — D631 Anemia in chronic kidney disease: Secondary | ICD-10-CM | POA: Diagnosis not present

## 2018-02-03 DIAGNOSIS — N2581 Secondary hyperparathyroidism of renal origin: Secondary | ICD-10-CM | POA: Diagnosis not present

## 2018-02-04 DIAGNOSIS — D631 Anemia in chronic kidney disease: Secondary | ICD-10-CM | POA: Diagnosis not present

## 2018-02-04 DIAGNOSIS — N2581 Secondary hyperparathyroidism of renal origin: Secondary | ICD-10-CM | POA: Diagnosis not present

## 2018-02-04 DIAGNOSIS — N186 End stage renal disease: Secondary | ICD-10-CM | POA: Diagnosis not present

## 2018-02-05 DIAGNOSIS — N2581 Secondary hyperparathyroidism of renal origin: Secondary | ICD-10-CM | POA: Diagnosis not present

## 2018-02-05 DIAGNOSIS — D631 Anemia in chronic kidney disease: Secondary | ICD-10-CM | POA: Diagnosis not present

## 2018-02-05 DIAGNOSIS — N186 End stage renal disease: Secondary | ICD-10-CM | POA: Diagnosis not present

## 2018-02-06 DIAGNOSIS — D631 Anemia in chronic kidney disease: Secondary | ICD-10-CM | POA: Diagnosis not present

## 2018-02-06 DIAGNOSIS — N186 End stage renal disease: Secondary | ICD-10-CM | POA: Diagnosis not present

## 2018-02-06 DIAGNOSIS — N2581 Secondary hyperparathyroidism of renal origin: Secondary | ICD-10-CM | POA: Diagnosis not present

## 2018-02-07 DIAGNOSIS — N186 End stage renal disease: Secondary | ICD-10-CM | POA: Diagnosis not present

## 2018-02-07 DIAGNOSIS — D631 Anemia in chronic kidney disease: Secondary | ICD-10-CM | POA: Diagnosis not present

## 2018-02-07 DIAGNOSIS — N2581 Secondary hyperparathyroidism of renal origin: Secondary | ICD-10-CM | POA: Diagnosis not present

## 2018-02-08 DIAGNOSIS — D631 Anemia in chronic kidney disease: Secondary | ICD-10-CM | POA: Diagnosis not present

## 2018-02-08 DIAGNOSIS — N186 End stage renal disease: Secondary | ICD-10-CM | POA: Diagnosis not present

## 2018-02-08 DIAGNOSIS — N2581 Secondary hyperparathyroidism of renal origin: Secondary | ICD-10-CM | POA: Diagnosis not present

## 2018-02-09 DIAGNOSIS — N2581 Secondary hyperparathyroidism of renal origin: Secondary | ICD-10-CM | POA: Diagnosis not present

## 2018-02-09 DIAGNOSIS — N186 End stage renal disease: Secondary | ICD-10-CM | POA: Diagnosis not present

## 2018-02-09 DIAGNOSIS — D631 Anemia in chronic kidney disease: Secondary | ICD-10-CM | POA: Diagnosis not present

## 2018-02-10 DIAGNOSIS — N2581 Secondary hyperparathyroidism of renal origin: Secondary | ICD-10-CM | POA: Diagnosis not present

## 2018-02-10 DIAGNOSIS — N186 End stage renal disease: Secondary | ICD-10-CM | POA: Diagnosis not present

## 2018-02-10 DIAGNOSIS — D631 Anemia in chronic kidney disease: Secondary | ICD-10-CM | POA: Diagnosis not present

## 2018-02-11 DIAGNOSIS — D631 Anemia in chronic kidney disease: Secondary | ICD-10-CM | POA: Diagnosis not present

## 2018-02-11 DIAGNOSIS — N186 End stage renal disease: Secondary | ICD-10-CM | POA: Diagnosis not present

## 2018-02-11 DIAGNOSIS — N2581 Secondary hyperparathyroidism of renal origin: Secondary | ICD-10-CM | POA: Diagnosis not present

## 2018-02-12 DIAGNOSIS — N186 End stage renal disease: Secondary | ICD-10-CM | POA: Diagnosis not present

## 2018-02-12 DIAGNOSIS — N2581 Secondary hyperparathyroidism of renal origin: Secondary | ICD-10-CM | POA: Diagnosis not present

## 2018-02-12 DIAGNOSIS — D631 Anemia in chronic kidney disease: Secondary | ICD-10-CM | POA: Diagnosis not present

## 2018-02-13 DIAGNOSIS — N2581 Secondary hyperparathyroidism of renal origin: Secondary | ICD-10-CM | POA: Diagnosis not present

## 2018-02-13 DIAGNOSIS — N186 End stage renal disease: Secondary | ICD-10-CM | POA: Diagnosis not present

## 2018-02-13 DIAGNOSIS — D631 Anemia in chronic kidney disease: Secondary | ICD-10-CM | POA: Diagnosis not present

## 2018-02-14 DIAGNOSIS — N186 End stage renal disease: Secondary | ICD-10-CM | POA: Diagnosis not present

## 2018-02-14 DIAGNOSIS — D631 Anemia in chronic kidney disease: Secondary | ICD-10-CM | POA: Diagnosis not present

## 2018-02-14 DIAGNOSIS — N2581 Secondary hyperparathyroidism of renal origin: Secondary | ICD-10-CM | POA: Diagnosis not present

## 2018-02-15 DIAGNOSIS — N2581 Secondary hyperparathyroidism of renal origin: Secondary | ICD-10-CM | POA: Diagnosis not present

## 2018-02-15 DIAGNOSIS — D631 Anemia in chronic kidney disease: Secondary | ICD-10-CM | POA: Diagnosis not present

## 2018-02-15 DIAGNOSIS — N186 End stage renal disease: Secondary | ICD-10-CM | POA: Diagnosis not present

## 2018-02-16 DIAGNOSIS — D631 Anemia in chronic kidney disease: Secondary | ICD-10-CM | POA: Diagnosis not present

## 2018-02-16 DIAGNOSIS — N2581 Secondary hyperparathyroidism of renal origin: Secondary | ICD-10-CM | POA: Diagnosis not present

## 2018-02-16 DIAGNOSIS — N186 End stage renal disease: Secondary | ICD-10-CM | POA: Diagnosis not present

## 2018-02-17 DIAGNOSIS — N186 End stage renal disease: Secondary | ICD-10-CM | POA: Diagnosis not present

## 2018-02-17 DIAGNOSIS — N2581 Secondary hyperparathyroidism of renal origin: Secondary | ICD-10-CM | POA: Diagnosis not present

## 2018-02-17 DIAGNOSIS — D631 Anemia in chronic kidney disease: Secondary | ICD-10-CM | POA: Diagnosis not present

## 2018-02-18 DIAGNOSIS — D631 Anemia in chronic kidney disease: Secondary | ICD-10-CM | POA: Diagnosis not present

## 2018-02-18 DIAGNOSIS — N2581 Secondary hyperparathyroidism of renal origin: Secondary | ICD-10-CM | POA: Diagnosis not present

## 2018-02-18 DIAGNOSIS — N186 End stage renal disease: Secondary | ICD-10-CM | POA: Diagnosis not present

## 2018-02-19 DIAGNOSIS — N186 End stage renal disease: Secondary | ICD-10-CM | POA: Diagnosis not present

## 2018-02-19 DIAGNOSIS — N2581 Secondary hyperparathyroidism of renal origin: Secondary | ICD-10-CM | POA: Diagnosis not present

## 2018-02-19 DIAGNOSIS — D631 Anemia in chronic kidney disease: Secondary | ICD-10-CM | POA: Diagnosis not present

## 2018-02-20 DIAGNOSIS — D631 Anemia in chronic kidney disease: Secondary | ICD-10-CM | POA: Diagnosis not present

## 2018-02-20 DIAGNOSIS — N2581 Secondary hyperparathyroidism of renal origin: Secondary | ICD-10-CM | POA: Diagnosis not present

## 2018-02-20 DIAGNOSIS — N186 End stage renal disease: Secondary | ICD-10-CM | POA: Diagnosis not present

## 2018-02-21 DIAGNOSIS — D631 Anemia in chronic kidney disease: Secondary | ICD-10-CM | POA: Diagnosis not present

## 2018-02-21 DIAGNOSIS — N186 End stage renal disease: Secondary | ICD-10-CM | POA: Diagnosis not present

## 2018-02-21 DIAGNOSIS — N2581 Secondary hyperparathyroidism of renal origin: Secondary | ICD-10-CM | POA: Diagnosis not present

## 2018-02-22 DIAGNOSIS — D631 Anemia in chronic kidney disease: Secondary | ICD-10-CM | POA: Diagnosis not present

## 2018-02-22 DIAGNOSIS — N2581 Secondary hyperparathyroidism of renal origin: Secondary | ICD-10-CM | POA: Diagnosis not present

## 2018-02-22 DIAGNOSIS — N186 End stage renal disease: Secondary | ICD-10-CM | POA: Diagnosis not present

## 2018-02-23 DIAGNOSIS — N186 End stage renal disease: Secondary | ICD-10-CM | POA: Diagnosis not present

## 2018-02-23 DIAGNOSIS — N2581 Secondary hyperparathyroidism of renal origin: Secondary | ICD-10-CM | POA: Diagnosis not present

## 2018-02-23 DIAGNOSIS — D631 Anemia in chronic kidney disease: Secondary | ICD-10-CM | POA: Diagnosis not present

## 2018-02-24 DIAGNOSIS — D631 Anemia in chronic kidney disease: Secondary | ICD-10-CM | POA: Diagnosis not present

## 2018-02-24 DIAGNOSIS — N186 End stage renal disease: Secondary | ICD-10-CM | POA: Diagnosis not present

## 2018-02-24 DIAGNOSIS — N2581 Secondary hyperparathyroidism of renal origin: Secondary | ICD-10-CM | POA: Diagnosis not present

## 2018-02-25 DIAGNOSIS — D631 Anemia in chronic kidney disease: Secondary | ICD-10-CM | POA: Diagnosis not present

## 2018-02-25 DIAGNOSIS — N2581 Secondary hyperparathyroidism of renal origin: Secondary | ICD-10-CM | POA: Diagnosis not present

## 2018-02-25 DIAGNOSIS — N186 End stage renal disease: Secondary | ICD-10-CM | POA: Diagnosis not present

## 2018-02-26 DIAGNOSIS — D631 Anemia in chronic kidney disease: Secondary | ICD-10-CM | POA: Diagnosis not present

## 2018-02-26 DIAGNOSIS — N186 End stage renal disease: Secondary | ICD-10-CM | POA: Diagnosis not present

## 2018-02-26 DIAGNOSIS — N2581 Secondary hyperparathyroidism of renal origin: Secondary | ICD-10-CM | POA: Diagnosis not present

## 2018-02-27 DIAGNOSIS — N2581 Secondary hyperparathyroidism of renal origin: Secondary | ICD-10-CM | POA: Diagnosis not present

## 2018-02-27 DIAGNOSIS — N186 End stage renal disease: Secondary | ICD-10-CM | POA: Diagnosis not present

## 2018-02-27 DIAGNOSIS — D631 Anemia in chronic kidney disease: Secondary | ICD-10-CM | POA: Diagnosis not present

## 2018-02-28 DIAGNOSIS — D631 Anemia in chronic kidney disease: Secondary | ICD-10-CM | POA: Diagnosis not present

## 2018-02-28 DIAGNOSIS — E119 Type 2 diabetes mellitus without complications: Secondary | ICD-10-CM | POA: Diagnosis not present

## 2018-02-28 DIAGNOSIS — Z4932 Encounter for adequacy testing for peritoneal dialysis: Secondary | ICD-10-CM | POA: Diagnosis not present

## 2018-02-28 DIAGNOSIS — N186 End stage renal disease: Secondary | ICD-10-CM | POA: Diagnosis not present

## 2018-02-28 DIAGNOSIS — N2581 Secondary hyperparathyroidism of renal origin: Secondary | ICD-10-CM | POA: Diagnosis not present

## 2018-03-01 DIAGNOSIS — N2581 Secondary hyperparathyroidism of renal origin: Secondary | ICD-10-CM | POA: Diagnosis not present

## 2018-03-01 DIAGNOSIS — N186 End stage renal disease: Secondary | ICD-10-CM | POA: Diagnosis not present

## 2018-03-01 DIAGNOSIS — D631 Anemia in chronic kidney disease: Secondary | ICD-10-CM | POA: Diagnosis not present

## 2018-03-02 DIAGNOSIS — D631 Anemia in chronic kidney disease: Secondary | ICD-10-CM | POA: Diagnosis not present

## 2018-03-02 DIAGNOSIS — N186 End stage renal disease: Secondary | ICD-10-CM | POA: Diagnosis not present

## 2018-03-02 DIAGNOSIS — N2581 Secondary hyperparathyroidism of renal origin: Secondary | ICD-10-CM | POA: Diagnosis not present

## 2018-03-03 DIAGNOSIS — N2581 Secondary hyperparathyroidism of renal origin: Secondary | ICD-10-CM | POA: Diagnosis not present

## 2018-03-03 DIAGNOSIS — D631 Anemia in chronic kidney disease: Secondary | ICD-10-CM | POA: Diagnosis not present

## 2018-03-03 DIAGNOSIS — N186 End stage renal disease: Secondary | ICD-10-CM | POA: Diagnosis not present

## 2018-03-04 DIAGNOSIS — N2581 Secondary hyperparathyroidism of renal origin: Secondary | ICD-10-CM | POA: Diagnosis not present

## 2018-03-04 DIAGNOSIS — D631 Anemia in chronic kidney disease: Secondary | ICD-10-CM | POA: Diagnosis not present

## 2018-03-04 DIAGNOSIS — N186 End stage renal disease: Secondary | ICD-10-CM | POA: Diagnosis not present

## 2018-03-05 DIAGNOSIS — N186 End stage renal disease: Secondary | ICD-10-CM | POA: Diagnosis not present

## 2018-03-05 DIAGNOSIS — D631 Anemia in chronic kidney disease: Secondary | ICD-10-CM | POA: Diagnosis not present

## 2018-03-05 DIAGNOSIS — N2581 Secondary hyperparathyroidism of renal origin: Secondary | ICD-10-CM | POA: Diagnosis not present

## 2018-03-06 DIAGNOSIS — N186 End stage renal disease: Secondary | ICD-10-CM | POA: Diagnosis not present

## 2018-03-06 DIAGNOSIS — D631 Anemia in chronic kidney disease: Secondary | ICD-10-CM | POA: Diagnosis not present

## 2018-03-06 DIAGNOSIS — N2581 Secondary hyperparathyroidism of renal origin: Secondary | ICD-10-CM | POA: Diagnosis not present

## 2018-03-07 DIAGNOSIS — D631 Anemia in chronic kidney disease: Secondary | ICD-10-CM | POA: Diagnosis not present

## 2018-03-07 DIAGNOSIS — N186 End stage renal disease: Secondary | ICD-10-CM | POA: Diagnosis not present

## 2018-03-07 DIAGNOSIS — N2581 Secondary hyperparathyroidism of renal origin: Secondary | ICD-10-CM | POA: Diagnosis not present

## 2018-03-08 DIAGNOSIS — D631 Anemia in chronic kidney disease: Secondary | ICD-10-CM | POA: Diagnosis not present

## 2018-03-08 DIAGNOSIS — N2581 Secondary hyperparathyroidism of renal origin: Secondary | ICD-10-CM | POA: Diagnosis not present

## 2018-03-08 DIAGNOSIS — N186 End stage renal disease: Secondary | ICD-10-CM | POA: Diagnosis not present

## 2018-03-09 DIAGNOSIS — N2581 Secondary hyperparathyroidism of renal origin: Secondary | ICD-10-CM | POA: Diagnosis not present

## 2018-03-09 DIAGNOSIS — N186 End stage renal disease: Secondary | ICD-10-CM | POA: Diagnosis not present

## 2018-03-09 DIAGNOSIS — D631 Anemia in chronic kidney disease: Secondary | ICD-10-CM | POA: Diagnosis not present

## 2018-03-10 DIAGNOSIS — D631 Anemia in chronic kidney disease: Secondary | ICD-10-CM | POA: Diagnosis not present

## 2018-03-10 DIAGNOSIS — N2581 Secondary hyperparathyroidism of renal origin: Secondary | ICD-10-CM | POA: Diagnosis not present

## 2018-03-10 DIAGNOSIS — N186 End stage renal disease: Secondary | ICD-10-CM | POA: Diagnosis not present

## 2018-03-11 DIAGNOSIS — N2581 Secondary hyperparathyroidism of renal origin: Secondary | ICD-10-CM | POA: Diagnosis not present

## 2018-03-11 DIAGNOSIS — N186 End stage renal disease: Secondary | ICD-10-CM | POA: Diagnosis not present

## 2018-03-11 DIAGNOSIS — D631 Anemia in chronic kidney disease: Secondary | ICD-10-CM | POA: Diagnosis not present

## 2018-03-12 DIAGNOSIS — N186 End stage renal disease: Secondary | ICD-10-CM | POA: Diagnosis not present

## 2018-03-12 DIAGNOSIS — D631 Anemia in chronic kidney disease: Secondary | ICD-10-CM | POA: Diagnosis not present

## 2018-03-12 DIAGNOSIS — N2581 Secondary hyperparathyroidism of renal origin: Secondary | ICD-10-CM | POA: Diagnosis not present

## 2018-03-13 DIAGNOSIS — N2581 Secondary hyperparathyroidism of renal origin: Secondary | ICD-10-CM | POA: Diagnosis not present

## 2018-03-13 DIAGNOSIS — N186 End stage renal disease: Secondary | ICD-10-CM | POA: Diagnosis not present

## 2018-03-13 DIAGNOSIS — D631 Anemia in chronic kidney disease: Secondary | ICD-10-CM | POA: Diagnosis not present

## 2018-03-14 DIAGNOSIS — N186 End stage renal disease: Secondary | ICD-10-CM | POA: Diagnosis not present

## 2018-03-14 DIAGNOSIS — N2581 Secondary hyperparathyroidism of renal origin: Secondary | ICD-10-CM | POA: Diagnosis not present

## 2018-03-14 DIAGNOSIS — D631 Anemia in chronic kidney disease: Secondary | ICD-10-CM | POA: Diagnosis not present

## 2018-03-15 DIAGNOSIS — N2581 Secondary hyperparathyroidism of renal origin: Secondary | ICD-10-CM | POA: Diagnosis not present

## 2018-03-15 DIAGNOSIS — N186 End stage renal disease: Secondary | ICD-10-CM | POA: Diagnosis not present

## 2018-03-15 DIAGNOSIS — D631 Anemia in chronic kidney disease: Secondary | ICD-10-CM | POA: Diagnosis not present

## 2018-03-16 DIAGNOSIS — N186 End stage renal disease: Secondary | ICD-10-CM | POA: Diagnosis not present

## 2018-03-16 DIAGNOSIS — N2581 Secondary hyperparathyroidism of renal origin: Secondary | ICD-10-CM | POA: Diagnosis not present

## 2018-03-16 DIAGNOSIS — D631 Anemia in chronic kidney disease: Secondary | ICD-10-CM | POA: Diagnosis not present

## 2018-03-17 DIAGNOSIS — N186 End stage renal disease: Secondary | ICD-10-CM | POA: Diagnosis not present

## 2018-03-17 DIAGNOSIS — D631 Anemia in chronic kidney disease: Secondary | ICD-10-CM | POA: Diagnosis not present

## 2018-03-17 DIAGNOSIS — N2581 Secondary hyperparathyroidism of renal origin: Secondary | ICD-10-CM | POA: Diagnosis not present

## 2018-03-18 DIAGNOSIS — N186 End stage renal disease: Secondary | ICD-10-CM | POA: Diagnosis not present

## 2018-03-18 DIAGNOSIS — N2581 Secondary hyperparathyroidism of renal origin: Secondary | ICD-10-CM | POA: Diagnosis not present

## 2018-03-18 DIAGNOSIS — D631 Anemia in chronic kidney disease: Secondary | ICD-10-CM | POA: Diagnosis not present

## 2018-03-19 DIAGNOSIS — N2581 Secondary hyperparathyroidism of renal origin: Secondary | ICD-10-CM | POA: Diagnosis not present

## 2018-03-19 DIAGNOSIS — N186 End stage renal disease: Secondary | ICD-10-CM | POA: Diagnosis not present

## 2018-03-19 DIAGNOSIS — D631 Anemia in chronic kidney disease: Secondary | ICD-10-CM | POA: Diagnosis not present

## 2018-03-20 DIAGNOSIS — D631 Anemia in chronic kidney disease: Secondary | ICD-10-CM | POA: Diagnosis not present

## 2018-03-20 DIAGNOSIS — N2581 Secondary hyperparathyroidism of renal origin: Secondary | ICD-10-CM | POA: Diagnosis not present

## 2018-03-20 DIAGNOSIS — N186 End stage renal disease: Secondary | ICD-10-CM | POA: Diagnosis not present

## 2018-03-21 DIAGNOSIS — D631 Anemia in chronic kidney disease: Secondary | ICD-10-CM | POA: Diagnosis not present

## 2018-03-21 DIAGNOSIS — N186 End stage renal disease: Secondary | ICD-10-CM | POA: Diagnosis not present

## 2018-03-21 DIAGNOSIS — N2581 Secondary hyperparathyroidism of renal origin: Secondary | ICD-10-CM | POA: Diagnosis not present

## 2018-03-22 DIAGNOSIS — N186 End stage renal disease: Secondary | ICD-10-CM | POA: Diagnosis not present

## 2018-03-22 DIAGNOSIS — N2581 Secondary hyperparathyroidism of renal origin: Secondary | ICD-10-CM | POA: Diagnosis not present

## 2018-03-22 DIAGNOSIS — D631 Anemia in chronic kidney disease: Secondary | ICD-10-CM | POA: Diagnosis not present

## 2018-03-23 DIAGNOSIS — N2581 Secondary hyperparathyroidism of renal origin: Secondary | ICD-10-CM | POA: Diagnosis not present

## 2018-03-23 DIAGNOSIS — D631 Anemia in chronic kidney disease: Secondary | ICD-10-CM | POA: Diagnosis not present

## 2018-03-23 DIAGNOSIS — N186 End stage renal disease: Secondary | ICD-10-CM | POA: Diagnosis not present

## 2018-03-24 DIAGNOSIS — D631 Anemia in chronic kidney disease: Secondary | ICD-10-CM | POA: Diagnosis not present

## 2018-03-24 DIAGNOSIS — N186 End stage renal disease: Secondary | ICD-10-CM | POA: Diagnosis not present

## 2018-03-24 DIAGNOSIS — N2581 Secondary hyperparathyroidism of renal origin: Secondary | ICD-10-CM | POA: Diagnosis not present

## 2018-03-25 DIAGNOSIS — Z992 Dependence on renal dialysis: Secondary | ICD-10-CM | POA: Diagnosis not present

## 2018-03-25 DIAGNOSIS — N186 End stage renal disease: Secondary | ICD-10-CM | POA: Diagnosis not present

## 2018-03-25 DIAGNOSIS — N2581 Secondary hyperparathyroidism of renal origin: Secondary | ICD-10-CM | POA: Diagnosis not present

## 2018-03-25 DIAGNOSIS — D631 Anemia in chronic kidney disease: Secondary | ICD-10-CM | POA: Diagnosis not present

## 2018-03-26 DIAGNOSIS — D631 Anemia in chronic kidney disease: Secondary | ICD-10-CM | POA: Diagnosis not present

## 2018-03-26 DIAGNOSIS — N2581 Secondary hyperparathyroidism of renal origin: Secondary | ICD-10-CM | POA: Diagnosis not present

## 2018-03-26 DIAGNOSIS — N186 End stage renal disease: Secondary | ICD-10-CM | POA: Diagnosis not present

## 2018-03-27 DIAGNOSIS — N2581 Secondary hyperparathyroidism of renal origin: Secondary | ICD-10-CM | POA: Diagnosis not present

## 2018-03-27 DIAGNOSIS — D631 Anemia in chronic kidney disease: Secondary | ICD-10-CM | POA: Diagnosis not present

## 2018-03-27 DIAGNOSIS — N186 End stage renal disease: Secondary | ICD-10-CM | POA: Diagnosis not present

## 2018-03-28 DIAGNOSIS — N186 End stage renal disease: Secondary | ICD-10-CM | POA: Diagnosis not present

## 2018-03-28 DIAGNOSIS — N2581 Secondary hyperparathyroidism of renal origin: Secondary | ICD-10-CM | POA: Diagnosis not present

## 2018-03-28 DIAGNOSIS — D631 Anemia in chronic kidney disease: Secondary | ICD-10-CM | POA: Diagnosis not present

## 2018-03-29 DIAGNOSIS — N186 End stage renal disease: Secondary | ICD-10-CM | POA: Diagnosis not present

## 2018-03-29 DIAGNOSIS — D631 Anemia in chronic kidney disease: Secondary | ICD-10-CM | POA: Diagnosis not present

## 2018-03-29 DIAGNOSIS — N2581 Secondary hyperparathyroidism of renal origin: Secondary | ICD-10-CM | POA: Diagnosis not present

## 2018-03-30 DIAGNOSIS — N2581 Secondary hyperparathyroidism of renal origin: Secondary | ICD-10-CM | POA: Diagnosis not present

## 2018-03-30 DIAGNOSIS — N186 End stage renal disease: Secondary | ICD-10-CM | POA: Diagnosis not present

## 2018-03-30 DIAGNOSIS — D631 Anemia in chronic kidney disease: Secondary | ICD-10-CM | POA: Diagnosis not present

## 2018-03-31 DIAGNOSIS — N186 End stage renal disease: Secondary | ICD-10-CM | POA: Diagnosis not present

## 2018-03-31 DIAGNOSIS — N2581 Secondary hyperparathyroidism of renal origin: Secondary | ICD-10-CM | POA: Diagnosis not present

## 2018-03-31 DIAGNOSIS — D631 Anemia in chronic kidney disease: Secondary | ICD-10-CM | POA: Diagnosis not present

## 2018-04-01 DIAGNOSIS — N186 End stage renal disease: Secondary | ICD-10-CM | POA: Diagnosis not present

## 2018-04-01 DIAGNOSIS — N2581 Secondary hyperparathyroidism of renal origin: Secondary | ICD-10-CM | POA: Diagnosis not present

## 2018-04-01 DIAGNOSIS — D631 Anemia in chronic kidney disease: Secondary | ICD-10-CM | POA: Diagnosis not present

## 2018-04-02 DIAGNOSIS — N2581 Secondary hyperparathyroidism of renal origin: Secondary | ICD-10-CM | POA: Diagnosis not present

## 2018-04-02 DIAGNOSIS — N186 End stage renal disease: Secondary | ICD-10-CM | POA: Diagnosis not present

## 2018-04-02 DIAGNOSIS — D631 Anemia in chronic kidney disease: Secondary | ICD-10-CM | POA: Diagnosis not present

## 2018-04-03 DIAGNOSIS — D631 Anemia in chronic kidney disease: Secondary | ICD-10-CM | POA: Diagnosis not present

## 2018-04-03 DIAGNOSIS — N186 End stage renal disease: Secondary | ICD-10-CM | POA: Diagnosis not present

## 2018-04-03 DIAGNOSIS — N2581 Secondary hyperparathyroidism of renal origin: Secondary | ICD-10-CM | POA: Diagnosis not present

## 2018-04-04 DIAGNOSIS — D631 Anemia in chronic kidney disease: Secondary | ICD-10-CM | POA: Diagnosis not present

## 2018-04-04 DIAGNOSIS — E119 Type 2 diabetes mellitus without complications: Secondary | ICD-10-CM | POA: Diagnosis not present

## 2018-04-04 DIAGNOSIS — N2581 Secondary hyperparathyroidism of renal origin: Secondary | ICD-10-CM | POA: Diagnosis not present

## 2018-04-04 DIAGNOSIS — N186 End stage renal disease: Secondary | ICD-10-CM | POA: Diagnosis not present

## 2018-04-05 DIAGNOSIS — D631 Anemia in chronic kidney disease: Secondary | ICD-10-CM | POA: Diagnosis not present

## 2018-04-05 DIAGNOSIS — N186 End stage renal disease: Secondary | ICD-10-CM | POA: Diagnosis not present

## 2018-04-05 DIAGNOSIS — N2581 Secondary hyperparathyroidism of renal origin: Secondary | ICD-10-CM | POA: Diagnosis not present

## 2018-04-06 DIAGNOSIS — N2581 Secondary hyperparathyroidism of renal origin: Secondary | ICD-10-CM | POA: Diagnosis not present

## 2018-04-06 DIAGNOSIS — N186 End stage renal disease: Secondary | ICD-10-CM | POA: Diagnosis not present

## 2018-04-06 DIAGNOSIS — D631 Anemia in chronic kidney disease: Secondary | ICD-10-CM | POA: Diagnosis not present

## 2018-04-07 DIAGNOSIS — N186 End stage renal disease: Secondary | ICD-10-CM | POA: Diagnosis not present

## 2018-04-07 DIAGNOSIS — D631 Anemia in chronic kidney disease: Secondary | ICD-10-CM | POA: Diagnosis not present

## 2018-04-07 DIAGNOSIS — N2581 Secondary hyperparathyroidism of renal origin: Secondary | ICD-10-CM | POA: Diagnosis not present

## 2018-04-08 DIAGNOSIS — N186 End stage renal disease: Secondary | ICD-10-CM | POA: Diagnosis not present

## 2018-04-08 DIAGNOSIS — D631 Anemia in chronic kidney disease: Secondary | ICD-10-CM | POA: Diagnosis not present

## 2018-04-08 DIAGNOSIS — N2581 Secondary hyperparathyroidism of renal origin: Secondary | ICD-10-CM | POA: Diagnosis not present

## 2018-04-09 DIAGNOSIS — N2581 Secondary hyperparathyroidism of renal origin: Secondary | ICD-10-CM | POA: Diagnosis not present

## 2018-04-09 DIAGNOSIS — D631 Anemia in chronic kidney disease: Secondary | ICD-10-CM | POA: Diagnosis not present

## 2018-04-09 DIAGNOSIS — N186 End stage renal disease: Secondary | ICD-10-CM | POA: Diagnosis not present

## 2018-04-10 DIAGNOSIS — N186 End stage renal disease: Secondary | ICD-10-CM | POA: Diagnosis not present

## 2018-04-10 DIAGNOSIS — D631 Anemia in chronic kidney disease: Secondary | ICD-10-CM | POA: Diagnosis not present

## 2018-04-10 DIAGNOSIS — N2581 Secondary hyperparathyroidism of renal origin: Secondary | ICD-10-CM | POA: Diagnosis not present

## 2018-04-11 DIAGNOSIS — D631 Anemia in chronic kidney disease: Secondary | ICD-10-CM | POA: Diagnosis not present

## 2018-04-11 DIAGNOSIS — N2581 Secondary hyperparathyroidism of renal origin: Secondary | ICD-10-CM | POA: Diagnosis not present

## 2018-04-11 DIAGNOSIS — N186 End stage renal disease: Secondary | ICD-10-CM | POA: Diagnosis not present

## 2018-04-12 DIAGNOSIS — D631 Anemia in chronic kidney disease: Secondary | ICD-10-CM | POA: Diagnosis not present

## 2018-04-12 DIAGNOSIS — N186 End stage renal disease: Secondary | ICD-10-CM | POA: Diagnosis not present

## 2018-04-12 DIAGNOSIS — N2581 Secondary hyperparathyroidism of renal origin: Secondary | ICD-10-CM | POA: Diagnosis not present

## 2018-04-13 DIAGNOSIS — N2581 Secondary hyperparathyroidism of renal origin: Secondary | ICD-10-CM | POA: Diagnosis not present

## 2018-04-13 DIAGNOSIS — D631 Anemia in chronic kidney disease: Secondary | ICD-10-CM | POA: Diagnosis not present

## 2018-04-13 DIAGNOSIS — N186 End stage renal disease: Secondary | ICD-10-CM | POA: Diagnosis not present

## 2018-04-14 DIAGNOSIS — N2581 Secondary hyperparathyroidism of renal origin: Secondary | ICD-10-CM | POA: Diagnosis not present

## 2018-04-14 DIAGNOSIS — D631 Anemia in chronic kidney disease: Secondary | ICD-10-CM | POA: Diagnosis not present

## 2018-04-14 DIAGNOSIS — N186 End stage renal disease: Secondary | ICD-10-CM | POA: Diagnosis not present

## 2018-04-15 DIAGNOSIS — N186 End stage renal disease: Secondary | ICD-10-CM | POA: Diagnosis not present

## 2018-04-15 DIAGNOSIS — D631 Anemia in chronic kidney disease: Secondary | ICD-10-CM | POA: Diagnosis not present

## 2018-04-15 DIAGNOSIS — N2581 Secondary hyperparathyroidism of renal origin: Secondary | ICD-10-CM | POA: Diagnosis not present

## 2018-04-16 DIAGNOSIS — D631 Anemia in chronic kidney disease: Secondary | ICD-10-CM | POA: Diagnosis not present

## 2018-04-16 DIAGNOSIS — N2581 Secondary hyperparathyroidism of renal origin: Secondary | ICD-10-CM | POA: Diagnosis not present

## 2018-04-16 DIAGNOSIS — N186 End stage renal disease: Secondary | ICD-10-CM | POA: Diagnosis not present

## 2018-04-17 DIAGNOSIS — D631 Anemia in chronic kidney disease: Secondary | ICD-10-CM | POA: Diagnosis not present

## 2018-04-17 DIAGNOSIS — N2581 Secondary hyperparathyroidism of renal origin: Secondary | ICD-10-CM | POA: Diagnosis not present

## 2018-04-17 DIAGNOSIS — N186 End stage renal disease: Secondary | ICD-10-CM | POA: Diagnosis not present

## 2018-04-18 DIAGNOSIS — D631 Anemia in chronic kidney disease: Secondary | ICD-10-CM | POA: Diagnosis not present

## 2018-04-18 DIAGNOSIS — N2581 Secondary hyperparathyroidism of renal origin: Secondary | ICD-10-CM | POA: Diagnosis not present

## 2018-04-18 DIAGNOSIS — N186 End stage renal disease: Secondary | ICD-10-CM | POA: Diagnosis not present

## 2018-04-19 DIAGNOSIS — N2581 Secondary hyperparathyroidism of renal origin: Secondary | ICD-10-CM | POA: Diagnosis not present

## 2018-04-19 DIAGNOSIS — D631 Anemia in chronic kidney disease: Secondary | ICD-10-CM | POA: Diagnosis not present

## 2018-04-19 DIAGNOSIS — N186 End stage renal disease: Secondary | ICD-10-CM | POA: Diagnosis not present

## 2018-04-20 DIAGNOSIS — N2581 Secondary hyperparathyroidism of renal origin: Secondary | ICD-10-CM | POA: Diagnosis not present

## 2018-04-20 DIAGNOSIS — D631 Anemia in chronic kidney disease: Secondary | ICD-10-CM | POA: Diagnosis not present

## 2018-04-20 DIAGNOSIS — N186 End stage renal disease: Secondary | ICD-10-CM | POA: Diagnosis not present

## 2018-04-21 DIAGNOSIS — D631 Anemia in chronic kidney disease: Secondary | ICD-10-CM | POA: Diagnosis not present

## 2018-04-21 DIAGNOSIS — N186 End stage renal disease: Secondary | ICD-10-CM | POA: Diagnosis not present

## 2018-04-21 DIAGNOSIS — N2581 Secondary hyperparathyroidism of renal origin: Secondary | ICD-10-CM | POA: Diagnosis not present

## 2018-04-22 DIAGNOSIS — D631 Anemia in chronic kidney disease: Secondary | ICD-10-CM | POA: Diagnosis not present

## 2018-04-22 DIAGNOSIS — N2581 Secondary hyperparathyroidism of renal origin: Secondary | ICD-10-CM | POA: Diagnosis not present

## 2018-04-22 DIAGNOSIS — N186 End stage renal disease: Secondary | ICD-10-CM | POA: Diagnosis not present

## 2018-04-23 DIAGNOSIS — N2581 Secondary hyperparathyroidism of renal origin: Secondary | ICD-10-CM | POA: Diagnosis not present

## 2018-04-23 DIAGNOSIS — D631 Anemia in chronic kidney disease: Secondary | ICD-10-CM | POA: Diagnosis not present

## 2018-04-23 DIAGNOSIS — N186 End stage renal disease: Secondary | ICD-10-CM | POA: Diagnosis not present

## 2018-04-24 DIAGNOSIS — N186 End stage renal disease: Secondary | ICD-10-CM | POA: Diagnosis not present

## 2018-04-24 DIAGNOSIS — N2581 Secondary hyperparathyroidism of renal origin: Secondary | ICD-10-CM | POA: Diagnosis not present

## 2018-04-24 DIAGNOSIS — D631 Anemia in chronic kidney disease: Secondary | ICD-10-CM | POA: Diagnosis not present

## 2018-04-25 DIAGNOSIS — N186 End stage renal disease: Secondary | ICD-10-CM | POA: Diagnosis not present

## 2018-04-25 DIAGNOSIS — D631 Anemia in chronic kidney disease: Secondary | ICD-10-CM | POA: Diagnosis not present

## 2018-04-25 DIAGNOSIS — N2581 Secondary hyperparathyroidism of renal origin: Secondary | ICD-10-CM | POA: Diagnosis not present

## 2018-04-25 DIAGNOSIS — Z992 Dependence on renal dialysis: Secondary | ICD-10-CM | POA: Diagnosis not present

## 2018-04-26 DIAGNOSIS — N186 End stage renal disease: Secondary | ICD-10-CM | POA: Diagnosis not present

## 2018-04-26 DIAGNOSIS — N2581 Secondary hyperparathyroidism of renal origin: Secondary | ICD-10-CM | POA: Diagnosis not present

## 2018-04-26 DIAGNOSIS — D631 Anemia in chronic kidney disease: Secondary | ICD-10-CM | POA: Diagnosis not present

## 2018-04-27 DIAGNOSIS — N2581 Secondary hyperparathyroidism of renal origin: Secondary | ICD-10-CM | POA: Diagnosis not present

## 2018-04-27 DIAGNOSIS — D631 Anemia in chronic kidney disease: Secondary | ICD-10-CM | POA: Diagnosis not present

## 2018-04-27 DIAGNOSIS — N186 End stage renal disease: Secondary | ICD-10-CM | POA: Diagnosis not present

## 2018-04-28 DIAGNOSIS — N2581 Secondary hyperparathyroidism of renal origin: Secondary | ICD-10-CM | POA: Diagnosis not present

## 2018-04-28 DIAGNOSIS — N186 End stage renal disease: Secondary | ICD-10-CM | POA: Diagnosis not present

## 2018-04-28 DIAGNOSIS — D631 Anemia in chronic kidney disease: Secondary | ICD-10-CM | POA: Diagnosis not present

## 2018-04-29 DIAGNOSIS — N186 End stage renal disease: Secondary | ICD-10-CM | POA: Diagnosis not present

## 2018-04-29 DIAGNOSIS — D631 Anemia in chronic kidney disease: Secondary | ICD-10-CM | POA: Diagnosis not present

## 2018-04-29 DIAGNOSIS — N2581 Secondary hyperparathyroidism of renal origin: Secondary | ICD-10-CM | POA: Diagnosis not present

## 2018-04-30 DIAGNOSIS — N2581 Secondary hyperparathyroidism of renal origin: Secondary | ICD-10-CM | POA: Diagnosis not present

## 2018-04-30 DIAGNOSIS — N186 End stage renal disease: Secondary | ICD-10-CM | POA: Diagnosis not present

## 2018-04-30 DIAGNOSIS — D631 Anemia in chronic kidney disease: Secondary | ICD-10-CM | POA: Diagnosis not present

## 2018-05-01 DIAGNOSIS — D631 Anemia in chronic kidney disease: Secondary | ICD-10-CM | POA: Diagnosis not present

## 2018-05-01 DIAGNOSIS — N2581 Secondary hyperparathyroidism of renal origin: Secondary | ICD-10-CM | POA: Diagnosis not present

## 2018-05-01 DIAGNOSIS — N186 End stage renal disease: Secondary | ICD-10-CM | POA: Diagnosis not present

## 2018-05-01 DIAGNOSIS — E119 Type 2 diabetes mellitus without complications: Secondary | ICD-10-CM | POA: Diagnosis not present

## 2018-05-02 DIAGNOSIS — N186 End stage renal disease: Secondary | ICD-10-CM | POA: Diagnosis not present

## 2018-05-02 DIAGNOSIS — N2581 Secondary hyperparathyroidism of renal origin: Secondary | ICD-10-CM | POA: Diagnosis not present

## 2018-05-02 DIAGNOSIS — D631 Anemia in chronic kidney disease: Secondary | ICD-10-CM | POA: Diagnosis not present

## 2018-05-03 DIAGNOSIS — D631 Anemia in chronic kidney disease: Secondary | ICD-10-CM | POA: Diagnosis not present

## 2018-05-03 DIAGNOSIS — N2581 Secondary hyperparathyroidism of renal origin: Secondary | ICD-10-CM | POA: Diagnosis not present

## 2018-05-03 DIAGNOSIS — N186 End stage renal disease: Secondary | ICD-10-CM | POA: Diagnosis not present

## 2018-05-04 DIAGNOSIS — N2581 Secondary hyperparathyroidism of renal origin: Secondary | ICD-10-CM | POA: Diagnosis not present

## 2018-05-04 DIAGNOSIS — N186 End stage renal disease: Secondary | ICD-10-CM | POA: Diagnosis not present

## 2018-05-04 DIAGNOSIS — D631 Anemia in chronic kidney disease: Secondary | ICD-10-CM | POA: Diagnosis not present

## 2018-05-05 DIAGNOSIS — D631 Anemia in chronic kidney disease: Secondary | ICD-10-CM | POA: Diagnosis not present

## 2018-05-05 DIAGNOSIS — N186 End stage renal disease: Secondary | ICD-10-CM | POA: Diagnosis not present

## 2018-05-05 DIAGNOSIS — N2581 Secondary hyperparathyroidism of renal origin: Secondary | ICD-10-CM | POA: Diagnosis not present

## 2018-05-06 DIAGNOSIS — N2581 Secondary hyperparathyroidism of renal origin: Secondary | ICD-10-CM | POA: Diagnosis not present

## 2018-05-06 DIAGNOSIS — D631 Anemia in chronic kidney disease: Secondary | ICD-10-CM | POA: Diagnosis not present

## 2018-05-06 DIAGNOSIS — N186 End stage renal disease: Secondary | ICD-10-CM | POA: Diagnosis not present

## 2018-05-07 DIAGNOSIS — N2581 Secondary hyperparathyroidism of renal origin: Secondary | ICD-10-CM | POA: Diagnosis not present

## 2018-05-07 DIAGNOSIS — N186 End stage renal disease: Secondary | ICD-10-CM | POA: Diagnosis not present

## 2018-05-07 DIAGNOSIS — D631 Anemia in chronic kidney disease: Secondary | ICD-10-CM | POA: Diagnosis not present

## 2018-05-08 DIAGNOSIS — D631 Anemia in chronic kidney disease: Secondary | ICD-10-CM | POA: Diagnosis not present

## 2018-05-08 DIAGNOSIS — N2581 Secondary hyperparathyroidism of renal origin: Secondary | ICD-10-CM | POA: Diagnosis not present

## 2018-05-08 DIAGNOSIS — N186 End stage renal disease: Secondary | ICD-10-CM | POA: Diagnosis not present

## 2018-05-09 DIAGNOSIS — D631 Anemia in chronic kidney disease: Secondary | ICD-10-CM | POA: Diagnosis not present

## 2018-05-09 DIAGNOSIS — N2581 Secondary hyperparathyroidism of renal origin: Secondary | ICD-10-CM | POA: Diagnosis not present

## 2018-05-09 DIAGNOSIS — N186 End stage renal disease: Secondary | ICD-10-CM | POA: Diagnosis not present

## 2018-05-10 DIAGNOSIS — D631 Anemia in chronic kidney disease: Secondary | ICD-10-CM | POA: Diagnosis not present

## 2018-05-10 DIAGNOSIS — N186 End stage renal disease: Secondary | ICD-10-CM | POA: Diagnosis not present

## 2018-05-10 DIAGNOSIS — N2581 Secondary hyperparathyroidism of renal origin: Secondary | ICD-10-CM | POA: Diagnosis not present

## 2018-05-11 DIAGNOSIS — D631 Anemia in chronic kidney disease: Secondary | ICD-10-CM | POA: Diagnosis not present

## 2018-05-11 DIAGNOSIS — N186 End stage renal disease: Secondary | ICD-10-CM | POA: Diagnosis not present

## 2018-05-11 DIAGNOSIS — N2581 Secondary hyperparathyroidism of renal origin: Secondary | ICD-10-CM | POA: Diagnosis not present

## 2018-05-12 DIAGNOSIS — D631 Anemia in chronic kidney disease: Secondary | ICD-10-CM | POA: Diagnosis not present

## 2018-05-12 DIAGNOSIS — N186 End stage renal disease: Secondary | ICD-10-CM | POA: Diagnosis not present

## 2018-05-12 DIAGNOSIS — N2581 Secondary hyperparathyroidism of renal origin: Secondary | ICD-10-CM | POA: Diagnosis not present

## 2018-05-13 DIAGNOSIS — D631 Anemia in chronic kidney disease: Secondary | ICD-10-CM | POA: Diagnosis not present

## 2018-05-13 DIAGNOSIS — N186 End stage renal disease: Secondary | ICD-10-CM | POA: Diagnosis not present

## 2018-05-13 DIAGNOSIS — N2581 Secondary hyperparathyroidism of renal origin: Secondary | ICD-10-CM | POA: Diagnosis not present

## 2018-05-14 DIAGNOSIS — N2581 Secondary hyperparathyroidism of renal origin: Secondary | ICD-10-CM | POA: Diagnosis not present

## 2018-05-14 DIAGNOSIS — D631 Anemia in chronic kidney disease: Secondary | ICD-10-CM | POA: Diagnosis not present

## 2018-05-14 DIAGNOSIS — N186 End stage renal disease: Secondary | ICD-10-CM | POA: Diagnosis not present

## 2018-05-15 DIAGNOSIS — D631 Anemia in chronic kidney disease: Secondary | ICD-10-CM | POA: Diagnosis not present

## 2018-05-15 DIAGNOSIS — N186 End stage renal disease: Secondary | ICD-10-CM | POA: Diagnosis not present

## 2018-05-15 DIAGNOSIS — N2581 Secondary hyperparathyroidism of renal origin: Secondary | ICD-10-CM | POA: Diagnosis not present

## 2018-05-16 DIAGNOSIS — N2581 Secondary hyperparathyroidism of renal origin: Secondary | ICD-10-CM | POA: Diagnosis not present

## 2018-05-16 DIAGNOSIS — N186 End stage renal disease: Secondary | ICD-10-CM | POA: Diagnosis not present

## 2018-05-16 DIAGNOSIS — D631 Anemia in chronic kidney disease: Secondary | ICD-10-CM | POA: Diagnosis not present

## 2018-05-17 DIAGNOSIS — N186 End stage renal disease: Secondary | ICD-10-CM | POA: Diagnosis not present

## 2018-05-17 DIAGNOSIS — D631 Anemia in chronic kidney disease: Secondary | ICD-10-CM | POA: Diagnosis not present

## 2018-05-17 DIAGNOSIS — N2581 Secondary hyperparathyroidism of renal origin: Secondary | ICD-10-CM | POA: Diagnosis not present

## 2018-05-18 DIAGNOSIS — N2581 Secondary hyperparathyroidism of renal origin: Secondary | ICD-10-CM | POA: Diagnosis not present

## 2018-05-18 DIAGNOSIS — N186 End stage renal disease: Secondary | ICD-10-CM | POA: Diagnosis not present

## 2018-05-18 DIAGNOSIS — D631 Anemia in chronic kidney disease: Secondary | ICD-10-CM | POA: Diagnosis not present

## 2018-05-19 DIAGNOSIS — N2581 Secondary hyperparathyroidism of renal origin: Secondary | ICD-10-CM | POA: Diagnosis not present

## 2018-05-19 DIAGNOSIS — D631 Anemia in chronic kidney disease: Secondary | ICD-10-CM | POA: Diagnosis not present

## 2018-05-19 DIAGNOSIS — N186 End stage renal disease: Secondary | ICD-10-CM | POA: Diagnosis not present

## 2018-05-20 DIAGNOSIS — D631 Anemia in chronic kidney disease: Secondary | ICD-10-CM | POA: Diagnosis not present

## 2018-05-20 DIAGNOSIS — N186 End stage renal disease: Secondary | ICD-10-CM | POA: Diagnosis not present

## 2018-05-20 DIAGNOSIS — N2581 Secondary hyperparathyroidism of renal origin: Secondary | ICD-10-CM | POA: Diagnosis not present

## 2018-05-21 DIAGNOSIS — N186 End stage renal disease: Secondary | ICD-10-CM | POA: Diagnosis not present

## 2018-05-21 DIAGNOSIS — D631 Anemia in chronic kidney disease: Secondary | ICD-10-CM | POA: Diagnosis not present

## 2018-05-21 DIAGNOSIS — N2581 Secondary hyperparathyroidism of renal origin: Secondary | ICD-10-CM | POA: Diagnosis not present

## 2018-05-22 DIAGNOSIS — D631 Anemia in chronic kidney disease: Secondary | ICD-10-CM | POA: Diagnosis not present

## 2018-05-22 DIAGNOSIS — N2581 Secondary hyperparathyroidism of renal origin: Secondary | ICD-10-CM | POA: Diagnosis not present

## 2018-05-22 DIAGNOSIS — E113299 Type 2 diabetes mellitus with mild nonproliferative diabetic retinopathy without macular edema, unspecified eye: Secondary | ICD-10-CM | POA: Diagnosis not present

## 2018-05-22 DIAGNOSIS — H3322 Serous retinal detachment, left eye: Secondary | ICD-10-CM | POA: Diagnosis not present

## 2018-05-22 DIAGNOSIS — N186 End stage renal disease: Secondary | ICD-10-CM | POA: Diagnosis not present

## 2018-05-23 DIAGNOSIS — D631 Anemia in chronic kidney disease: Secondary | ICD-10-CM | POA: Diagnosis not present

## 2018-05-23 DIAGNOSIS — N2581 Secondary hyperparathyroidism of renal origin: Secondary | ICD-10-CM | POA: Diagnosis not present

## 2018-05-23 DIAGNOSIS — N186 End stage renal disease: Secondary | ICD-10-CM | POA: Diagnosis not present

## 2018-05-24 DIAGNOSIS — Z992 Dependence on renal dialysis: Secondary | ICD-10-CM | POA: Diagnosis not present

## 2018-05-24 DIAGNOSIS — N186 End stage renal disease: Secondary | ICD-10-CM | POA: Diagnosis not present

## 2018-05-24 DIAGNOSIS — D631 Anemia in chronic kidney disease: Secondary | ICD-10-CM | POA: Diagnosis not present

## 2018-05-24 DIAGNOSIS — N2581 Secondary hyperparathyroidism of renal origin: Secondary | ICD-10-CM | POA: Diagnosis not present

## 2018-05-25 DIAGNOSIS — D631 Anemia in chronic kidney disease: Secondary | ICD-10-CM | POA: Diagnosis not present

## 2018-05-25 DIAGNOSIS — N2581 Secondary hyperparathyroidism of renal origin: Secondary | ICD-10-CM | POA: Diagnosis not present

## 2018-05-25 DIAGNOSIS — N186 End stage renal disease: Secondary | ICD-10-CM | POA: Diagnosis not present

## 2018-05-26 DIAGNOSIS — N2581 Secondary hyperparathyroidism of renal origin: Secondary | ICD-10-CM | POA: Diagnosis not present

## 2018-05-26 DIAGNOSIS — D631 Anemia in chronic kidney disease: Secondary | ICD-10-CM | POA: Diagnosis not present

## 2018-05-26 DIAGNOSIS — N186 End stage renal disease: Secondary | ICD-10-CM | POA: Diagnosis not present

## 2018-05-27 DIAGNOSIS — D631 Anemia in chronic kidney disease: Secondary | ICD-10-CM | POA: Diagnosis not present

## 2018-05-27 DIAGNOSIS — N186 End stage renal disease: Secondary | ICD-10-CM | POA: Diagnosis not present

## 2018-05-27 DIAGNOSIS — N2581 Secondary hyperparathyroidism of renal origin: Secondary | ICD-10-CM | POA: Diagnosis not present

## 2018-05-28 DIAGNOSIS — N2581 Secondary hyperparathyroidism of renal origin: Secondary | ICD-10-CM | POA: Diagnosis not present

## 2018-05-28 DIAGNOSIS — D631 Anemia in chronic kidney disease: Secondary | ICD-10-CM | POA: Diagnosis not present

## 2018-05-28 DIAGNOSIS — N186 End stage renal disease: Secondary | ICD-10-CM | POA: Diagnosis not present

## 2018-05-29 DIAGNOSIS — N2581 Secondary hyperparathyroidism of renal origin: Secondary | ICD-10-CM | POA: Diagnosis not present

## 2018-05-29 DIAGNOSIS — N186 End stage renal disease: Secondary | ICD-10-CM | POA: Diagnosis not present

## 2018-05-29 DIAGNOSIS — D631 Anemia in chronic kidney disease: Secondary | ICD-10-CM | POA: Diagnosis not present

## 2018-05-30 DIAGNOSIS — N2581 Secondary hyperparathyroidism of renal origin: Secondary | ICD-10-CM | POA: Diagnosis not present

## 2018-05-30 DIAGNOSIS — N186 End stage renal disease: Secondary | ICD-10-CM | POA: Diagnosis not present

## 2018-05-30 DIAGNOSIS — D631 Anemia in chronic kidney disease: Secondary | ICD-10-CM | POA: Diagnosis not present

## 2018-05-31 DIAGNOSIS — N186 End stage renal disease: Secondary | ICD-10-CM | POA: Diagnosis not present

## 2018-05-31 DIAGNOSIS — N2581 Secondary hyperparathyroidism of renal origin: Secondary | ICD-10-CM | POA: Diagnosis not present

## 2018-05-31 DIAGNOSIS — D631 Anemia in chronic kidney disease: Secondary | ICD-10-CM | POA: Diagnosis not present

## 2018-06-01 DIAGNOSIS — N186 End stage renal disease: Secondary | ICD-10-CM | POA: Diagnosis not present

## 2018-06-01 DIAGNOSIS — D631 Anemia in chronic kidney disease: Secondary | ICD-10-CM | POA: Diagnosis not present

## 2018-06-01 DIAGNOSIS — N2581 Secondary hyperparathyroidism of renal origin: Secondary | ICD-10-CM | POA: Diagnosis not present

## 2018-06-02 DIAGNOSIS — D631 Anemia in chronic kidney disease: Secondary | ICD-10-CM | POA: Diagnosis not present

## 2018-06-02 DIAGNOSIS — N186 End stage renal disease: Secondary | ICD-10-CM | POA: Diagnosis not present

## 2018-06-02 DIAGNOSIS — N2581 Secondary hyperparathyroidism of renal origin: Secondary | ICD-10-CM | POA: Diagnosis not present

## 2018-06-03 DIAGNOSIS — N2581 Secondary hyperparathyroidism of renal origin: Secondary | ICD-10-CM | POA: Diagnosis not present

## 2018-06-03 DIAGNOSIS — D631 Anemia in chronic kidney disease: Secondary | ICD-10-CM | POA: Diagnosis not present

## 2018-06-03 DIAGNOSIS — N186 End stage renal disease: Secondary | ICD-10-CM | POA: Diagnosis not present

## 2018-06-04 DIAGNOSIS — N186 End stage renal disease: Secondary | ICD-10-CM | POA: Diagnosis not present

## 2018-06-04 DIAGNOSIS — N2581 Secondary hyperparathyroidism of renal origin: Secondary | ICD-10-CM | POA: Diagnosis not present

## 2018-06-04 DIAGNOSIS — D631 Anemia in chronic kidney disease: Secondary | ICD-10-CM | POA: Diagnosis not present

## 2018-06-05 DIAGNOSIS — N186 End stage renal disease: Secondary | ICD-10-CM | POA: Diagnosis not present

## 2018-06-05 DIAGNOSIS — N2581 Secondary hyperparathyroidism of renal origin: Secondary | ICD-10-CM | POA: Diagnosis not present

## 2018-06-05 DIAGNOSIS — D631 Anemia in chronic kidney disease: Secondary | ICD-10-CM | POA: Diagnosis not present

## 2018-06-06 DIAGNOSIS — N186 End stage renal disease: Secondary | ICD-10-CM | POA: Diagnosis not present

## 2018-06-06 DIAGNOSIS — N2581 Secondary hyperparathyroidism of renal origin: Secondary | ICD-10-CM | POA: Diagnosis not present

## 2018-06-06 DIAGNOSIS — D631 Anemia in chronic kidney disease: Secondary | ICD-10-CM | POA: Diagnosis not present

## 2018-06-07 DIAGNOSIS — D631 Anemia in chronic kidney disease: Secondary | ICD-10-CM | POA: Diagnosis not present

## 2018-06-07 DIAGNOSIS — N2581 Secondary hyperparathyroidism of renal origin: Secondary | ICD-10-CM | POA: Diagnosis not present

## 2018-06-07 DIAGNOSIS — N186 End stage renal disease: Secondary | ICD-10-CM | POA: Diagnosis not present

## 2018-06-08 DIAGNOSIS — N186 End stage renal disease: Secondary | ICD-10-CM | POA: Diagnosis not present

## 2018-06-08 DIAGNOSIS — D631 Anemia in chronic kidney disease: Secondary | ICD-10-CM | POA: Diagnosis not present

## 2018-06-08 DIAGNOSIS — N2581 Secondary hyperparathyroidism of renal origin: Secondary | ICD-10-CM | POA: Diagnosis not present

## 2018-06-09 DIAGNOSIS — D631 Anemia in chronic kidney disease: Secondary | ICD-10-CM | POA: Diagnosis not present

## 2018-06-09 DIAGNOSIS — N186 End stage renal disease: Secondary | ICD-10-CM | POA: Diagnosis not present

## 2018-06-09 DIAGNOSIS — N2581 Secondary hyperparathyroidism of renal origin: Secondary | ICD-10-CM | POA: Diagnosis not present

## 2018-06-10 DIAGNOSIS — N186 End stage renal disease: Secondary | ICD-10-CM | POA: Diagnosis not present

## 2018-06-10 DIAGNOSIS — N2581 Secondary hyperparathyroidism of renal origin: Secondary | ICD-10-CM | POA: Diagnosis not present

## 2018-06-10 DIAGNOSIS — D631 Anemia in chronic kidney disease: Secondary | ICD-10-CM | POA: Diagnosis not present

## 2018-06-11 DIAGNOSIS — N186 End stage renal disease: Secondary | ICD-10-CM | POA: Diagnosis not present

## 2018-06-11 DIAGNOSIS — D631 Anemia in chronic kidney disease: Secondary | ICD-10-CM | POA: Diagnosis not present

## 2018-06-11 DIAGNOSIS — N2581 Secondary hyperparathyroidism of renal origin: Secondary | ICD-10-CM | POA: Diagnosis not present

## 2018-06-12 DIAGNOSIS — N2581 Secondary hyperparathyroidism of renal origin: Secondary | ICD-10-CM | POA: Diagnosis not present

## 2018-06-12 DIAGNOSIS — N186 End stage renal disease: Secondary | ICD-10-CM | POA: Diagnosis not present

## 2018-06-12 DIAGNOSIS — D631 Anemia in chronic kidney disease: Secondary | ICD-10-CM | POA: Diagnosis not present

## 2018-06-13 DIAGNOSIS — D631 Anemia in chronic kidney disease: Secondary | ICD-10-CM | POA: Diagnosis not present

## 2018-06-13 DIAGNOSIS — N2581 Secondary hyperparathyroidism of renal origin: Secondary | ICD-10-CM | POA: Diagnosis not present

## 2018-06-13 DIAGNOSIS — N186 End stage renal disease: Secondary | ICD-10-CM | POA: Diagnosis not present

## 2018-06-14 DIAGNOSIS — N186 End stage renal disease: Secondary | ICD-10-CM | POA: Diagnosis not present

## 2018-06-14 DIAGNOSIS — N2581 Secondary hyperparathyroidism of renal origin: Secondary | ICD-10-CM | POA: Diagnosis not present

## 2018-06-14 DIAGNOSIS — D631 Anemia in chronic kidney disease: Secondary | ICD-10-CM | POA: Diagnosis not present

## 2018-06-15 DIAGNOSIS — D631 Anemia in chronic kidney disease: Secondary | ICD-10-CM | POA: Diagnosis not present

## 2018-06-15 DIAGNOSIS — N2581 Secondary hyperparathyroidism of renal origin: Secondary | ICD-10-CM | POA: Diagnosis not present

## 2018-06-15 DIAGNOSIS — N186 End stage renal disease: Secondary | ICD-10-CM | POA: Diagnosis not present

## 2018-06-16 DIAGNOSIS — D631 Anemia in chronic kidney disease: Secondary | ICD-10-CM | POA: Diagnosis not present

## 2018-06-16 DIAGNOSIS — E119 Type 2 diabetes mellitus without complications: Secondary | ICD-10-CM | POA: Diagnosis not present

## 2018-06-16 DIAGNOSIS — N186 End stage renal disease: Secondary | ICD-10-CM | POA: Diagnosis not present

## 2018-06-16 DIAGNOSIS — N2581 Secondary hyperparathyroidism of renal origin: Secondary | ICD-10-CM | POA: Diagnosis not present

## 2018-06-17 DIAGNOSIS — N2581 Secondary hyperparathyroidism of renal origin: Secondary | ICD-10-CM | POA: Diagnosis not present

## 2018-06-17 DIAGNOSIS — D631 Anemia in chronic kidney disease: Secondary | ICD-10-CM | POA: Diagnosis not present

## 2018-06-17 DIAGNOSIS — N186 End stage renal disease: Secondary | ICD-10-CM | POA: Diagnosis not present

## 2018-06-18 DIAGNOSIS — D631 Anemia in chronic kidney disease: Secondary | ICD-10-CM | POA: Diagnosis not present

## 2018-06-18 DIAGNOSIS — N2581 Secondary hyperparathyroidism of renal origin: Secondary | ICD-10-CM | POA: Diagnosis not present

## 2018-06-18 DIAGNOSIS — N186 End stage renal disease: Secondary | ICD-10-CM | POA: Diagnosis not present

## 2018-06-19 DIAGNOSIS — N2581 Secondary hyperparathyroidism of renal origin: Secondary | ICD-10-CM | POA: Diagnosis not present

## 2018-06-19 DIAGNOSIS — N186 End stage renal disease: Secondary | ICD-10-CM | POA: Diagnosis not present

## 2018-06-19 DIAGNOSIS — D631 Anemia in chronic kidney disease: Secondary | ICD-10-CM | POA: Diagnosis not present

## 2018-06-20 DIAGNOSIS — N186 End stage renal disease: Secondary | ICD-10-CM | POA: Diagnosis not present

## 2018-06-20 DIAGNOSIS — D631 Anemia in chronic kidney disease: Secondary | ICD-10-CM | POA: Diagnosis not present

## 2018-06-20 DIAGNOSIS — N2581 Secondary hyperparathyroidism of renal origin: Secondary | ICD-10-CM | POA: Diagnosis not present

## 2018-06-21 DIAGNOSIS — N186 End stage renal disease: Secondary | ICD-10-CM | POA: Diagnosis not present

## 2018-06-21 DIAGNOSIS — D631 Anemia in chronic kidney disease: Secondary | ICD-10-CM | POA: Diagnosis not present

## 2018-06-21 DIAGNOSIS — N2581 Secondary hyperparathyroidism of renal origin: Secondary | ICD-10-CM | POA: Diagnosis not present

## 2018-06-22 DIAGNOSIS — N186 End stage renal disease: Secondary | ICD-10-CM | POA: Diagnosis not present

## 2018-06-22 DIAGNOSIS — N2581 Secondary hyperparathyroidism of renal origin: Secondary | ICD-10-CM | POA: Diagnosis not present

## 2018-06-22 DIAGNOSIS — D631 Anemia in chronic kidney disease: Secondary | ICD-10-CM | POA: Diagnosis not present

## 2018-06-23 DIAGNOSIS — N2581 Secondary hyperparathyroidism of renal origin: Secondary | ICD-10-CM | POA: Diagnosis not present

## 2018-06-23 DIAGNOSIS — D631 Anemia in chronic kidney disease: Secondary | ICD-10-CM | POA: Diagnosis not present

## 2018-06-23 DIAGNOSIS — N186 End stage renal disease: Secondary | ICD-10-CM | POA: Diagnosis not present

## 2018-06-24 DIAGNOSIS — D631 Anemia in chronic kidney disease: Secondary | ICD-10-CM | POA: Diagnosis not present

## 2018-06-24 DIAGNOSIS — N2581 Secondary hyperparathyroidism of renal origin: Secondary | ICD-10-CM | POA: Diagnosis not present

## 2018-06-24 DIAGNOSIS — N186 End stage renal disease: Secondary | ICD-10-CM | POA: Diagnosis not present

## 2018-06-25 DIAGNOSIS — N2581 Secondary hyperparathyroidism of renal origin: Secondary | ICD-10-CM | POA: Diagnosis not present

## 2018-06-25 DIAGNOSIS — D631 Anemia in chronic kidney disease: Secondary | ICD-10-CM | POA: Diagnosis not present

## 2018-06-25 DIAGNOSIS — E785 Hyperlipidemia, unspecified: Secondary | ICD-10-CM | POA: Diagnosis not present

## 2018-06-25 DIAGNOSIS — N186 End stage renal disease: Secondary | ICD-10-CM | POA: Diagnosis not present

## 2018-06-26 DIAGNOSIS — E785 Hyperlipidemia, unspecified: Secondary | ICD-10-CM | POA: Diagnosis not present

## 2018-06-26 DIAGNOSIS — D631 Anemia in chronic kidney disease: Secondary | ICD-10-CM | POA: Diagnosis not present

## 2018-06-26 DIAGNOSIS — N186 End stage renal disease: Secondary | ICD-10-CM | POA: Diagnosis not present

## 2018-06-26 DIAGNOSIS — N2581 Secondary hyperparathyroidism of renal origin: Secondary | ICD-10-CM | POA: Diagnosis not present

## 2018-06-27 DIAGNOSIS — D631 Anemia in chronic kidney disease: Secondary | ICD-10-CM | POA: Diagnosis not present

## 2018-06-27 DIAGNOSIS — E785 Hyperlipidemia, unspecified: Secondary | ICD-10-CM | POA: Diagnosis not present

## 2018-06-27 DIAGNOSIS — N2581 Secondary hyperparathyroidism of renal origin: Secondary | ICD-10-CM | POA: Diagnosis not present

## 2018-06-27 DIAGNOSIS — N186 End stage renal disease: Secondary | ICD-10-CM | POA: Diagnosis not present

## 2018-06-28 DIAGNOSIS — N186 End stage renal disease: Secondary | ICD-10-CM | POA: Diagnosis not present

## 2018-06-28 DIAGNOSIS — D631 Anemia in chronic kidney disease: Secondary | ICD-10-CM | POA: Diagnosis not present

## 2018-06-28 DIAGNOSIS — N2581 Secondary hyperparathyroidism of renal origin: Secondary | ICD-10-CM | POA: Diagnosis not present

## 2018-06-28 DIAGNOSIS — E785 Hyperlipidemia, unspecified: Secondary | ICD-10-CM | POA: Diagnosis not present

## 2018-06-29 DIAGNOSIS — E785 Hyperlipidemia, unspecified: Secondary | ICD-10-CM | POA: Diagnosis not present

## 2018-06-29 DIAGNOSIS — D631 Anemia in chronic kidney disease: Secondary | ICD-10-CM | POA: Diagnosis not present

## 2018-06-29 DIAGNOSIS — N186 End stage renal disease: Secondary | ICD-10-CM | POA: Diagnosis not present

## 2018-06-29 DIAGNOSIS — N2581 Secondary hyperparathyroidism of renal origin: Secondary | ICD-10-CM | POA: Diagnosis not present

## 2018-06-30 DIAGNOSIS — D631 Anemia in chronic kidney disease: Secondary | ICD-10-CM | POA: Diagnosis not present

## 2018-06-30 DIAGNOSIS — E785 Hyperlipidemia, unspecified: Secondary | ICD-10-CM | POA: Diagnosis not present

## 2018-06-30 DIAGNOSIS — N186 End stage renal disease: Secondary | ICD-10-CM | POA: Diagnosis not present

## 2018-06-30 DIAGNOSIS — N2581 Secondary hyperparathyroidism of renal origin: Secondary | ICD-10-CM | POA: Diagnosis not present

## 2018-07-01 DIAGNOSIS — D631 Anemia in chronic kidney disease: Secondary | ICD-10-CM | POA: Diagnosis not present

## 2018-07-01 DIAGNOSIS — E785 Hyperlipidemia, unspecified: Secondary | ICD-10-CM | POA: Diagnosis not present

## 2018-07-01 DIAGNOSIS — N2581 Secondary hyperparathyroidism of renal origin: Secondary | ICD-10-CM | POA: Diagnosis not present

## 2018-07-01 DIAGNOSIS — N186 End stage renal disease: Secondary | ICD-10-CM | POA: Diagnosis not present

## 2018-07-02 DIAGNOSIS — E785 Hyperlipidemia, unspecified: Secondary | ICD-10-CM | POA: Diagnosis not present

## 2018-07-02 DIAGNOSIS — D631 Anemia in chronic kidney disease: Secondary | ICD-10-CM | POA: Diagnosis not present

## 2018-07-02 DIAGNOSIS — N2581 Secondary hyperparathyroidism of renal origin: Secondary | ICD-10-CM | POA: Diagnosis not present

## 2018-07-02 DIAGNOSIS — N186 End stage renal disease: Secondary | ICD-10-CM | POA: Diagnosis not present

## 2018-07-03 DIAGNOSIS — D631 Anemia in chronic kidney disease: Secondary | ICD-10-CM | POA: Diagnosis not present

## 2018-07-03 DIAGNOSIS — E785 Hyperlipidemia, unspecified: Secondary | ICD-10-CM | POA: Diagnosis not present

## 2018-07-03 DIAGNOSIS — N186 End stage renal disease: Secondary | ICD-10-CM | POA: Diagnosis not present

## 2018-07-03 DIAGNOSIS — N2581 Secondary hyperparathyroidism of renal origin: Secondary | ICD-10-CM | POA: Diagnosis not present

## 2018-07-04 DIAGNOSIS — N2581 Secondary hyperparathyroidism of renal origin: Secondary | ICD-10-CM | POA: Diagnosis not present

## 2018-07-04 DIAGNOSIS — D631 Anemia in chronic kidney disease: Secondary | ICD-10-CM | POA: Diagnosis not present

## 2018-07-04 DIAGNOSIS — E785 Hyperlipidemia, unspecified: Secondary | ICD-10-CM | POA: Diagnosis not present

## 2018-07-04 DIAGNOSIS — N186 End stage renal disease: Secondary | ICD-10-CM | POA: Diagnosis not present

## 2018-07-05 DIAGNOSIS — E785 Hyperlipidemia, unspecified: Secondary | ICD-10-CM | POA: Diagnosis not present

## 2018-07-05 DIAGNOSIS — D631 Anemia in chronic kidney disease: Secondary | ICD-10-CM | POA: Diagnosis not present

## 2018-07-05 DIAGNOSIS — N2581 Secondary hyperparathyroidism of renal origin: Secondary | ICD-10-CM | POA: Diagnosis not present

## 2018-07-05 DIAGNOSIS — N186 End stage renal disease: Secondary | ICD-10-CM | POA: Diagnosis not present

## 2018-07-06 DIAGNOSIS — E785 Hyperlipidemia, unspecified: Secondary | ICD-10-CM | POA: Diagnosis not present

## 2018-07-06 DIAGNOSIS — N186 End stage renal disease: Secondary | ICD-10-CM | POA: Diagnosis not present

## 2018-07-06 DIAGNOSIS — D631 Anemia in chronic kidney disease: Secondary | ICD-10-CM | POA: Diagnosis not present

## 2018-07-06 DIAGNOSIS — N2581 Secondary hyperparathyroidism of renal origin: Secondary | ICD-10-CM | POA: Diagnosis not present

## 2018-07-07 DIAGNOSIS — N2581 Secondary hyperparathyroidism of renal origin: Secondary | ICD-10-CM | POA: Diagnosis not present

## 2018-07-07 DIAGNOSIS — D631 Anemia in chronic kidney disease: Secondary | ICD-10-CM | POA: Diagnosis not present

## 2018-07-07 DIAGNOSIS — N186 End stage renal disease: Secondary | ICD-10-CM | POA: Diagnosis not present

## 2018-07-07 DIAGNOSIS — E785 Hyperlipidemia, unspecified: Secondary | ICD-10-CM | POA: Diagnosis not present

## 2018-07-08 DIAGNOSIS — E119 Type 2 diabetes mellitus without complications: Secondary | ICD-10-CM | POA: Diagnosis not present

## 2018-07-08 DIAGNOSIS — D631 Anemia in chronic kidney disease: Secondary | ICD-10-CM | POA: Diagnosis not present

## 2018-07-08 DIAGNOSIS — R52 Pain, unspecified: Secondary | ICD-10-CM | POA: Diagnosis not present

## 2018-07-08 DIAGNOSIS — M794 Hypertrophy of (infrapatellar) fat pad: Secondary | ICD-10-CM | POA: Diagnosis not present

## 2018-07-08 DIAGNOSIS — E785 Hyperlipidemia, unspecified: Secondary | ICD-10-CM | POA: Diagnosis not present

## 2018-07-08 DIAGNOSIS — Z79899 Other long term (current) drug therapy: Secondary | ICD-10-CM | POA: Diagnosis not present

## 2018-07-08 DIAGNOSIS — R079 Chest pain, unspecified: Secondary | ICD-10-CM | POA: Diagnosis not present

## 2018-07-08 DIAGNOSIS — R109 Unspecified abdominal pain: Secondary | ICD-10-CM | POA: Diagnosis not present

## 2018-07-08 DIAGNOSIS — N2581 Secondary hyperparathyroidism of renal origin: Secondary | ICD-10-CM | POA: Diagnosis not present

## 2018-07-08 DIAGNOSIS — N186 End stage renal disease: Secondary | ICD-10-CM | POA: Diagnosis not present

## 2018-07-09 DIAGNOSIS — D631 Anemia in chronic kidney disease: Secondary | ICD-10-CM | POA: Diagnosis not present

## 2018-07-09 DIAGNOSIS — N2581 Secondary hyperparathyroidism of renal origin: Secondary | ICD-10-CM | POA: Diagnosis not present

## 2018-07-09 DIAGNOSIS — E785 Hyperlipidemia, unspecified: Secondary | ICD-10-CM | POA: Diagnosis not present

## 2018-07-09 DIAGNOSIS — N186 End stage renal disease: Secondary | ICD-10-CM | POA: Diagnosis not present

## 2018-07-10 DIAGNOSIS — N186 End stage renal disease: Secondary | ICD-10-CM | POA: Diagnosis not present

## 2018-07-10 DIAGNOSIS — N2581 Secondary hyperparathyroidism of renal origin: Secondary | ICD-10-CM | POA: Diagnosis not present

## 2018-07-10 DIAGNOSIS — E785 Hyperlipidemia, unspecified: Secondary | ICD-10-CM | POA: Diagnosis not present

## 2018-07-10 DIAGNOSIS — D631 Anemia in chronic kidney disease: Secondary | ICD-10-CM | POA: Diagnosis not present

## 2018-07-11 DIAGNOSIS — E785 Hyperlipidemia, unspecified: Secondary | ICD-10-CM | POA: Diagnosis not present

## 2018-07-11 DIAGNOSIS — N2581 Secondary hyperparathyroidism of renal origin: Secondary | ICD-10-CM | POA: Diagnosis not present

## 2018-07-11 DIAGNOSIS — D631 Anemia in chronic kidney disease: Secondary | ICD-10-CM | POA: Diagnosis not present

## 2018-07-11 DIAGNOSIS — N186 End stage renal disease: Secondary | ICD-10-CM | POA: Diagnosis not present

## 2018-07-12 DIAGNOSIS — E785 Hyperlipidemia, unspecified: Secondary | ICD-10-CM | POA: Diagnosis not present

## 2018-07-12 DIAGNOSIS — D631 Anemia in chronic kidney disease: Secondary | ICD-10-CM | POA: Diagnosis not present

## 2018-07-12 DIAGNOSIS — N186 End stage renal disease: Secondary | ICD-10-CM | POA: Diagnosis not present

## 2018-07-12 DIAGNOSIS — N2581 Secondary hyperparathyroidism of renal origin: Secondary | ICD-10-CM | POA: Diagnosis not present

## 2018-07-13 DIAGNOSIS — N2581 Secondary hyperparathyroidism of renal origin: Secondary | ICD-10-CM | POA: Diagnosis not present

## 2018-07-13 DIAGNOSIS — E785 Hyperlipidemia, unspecified: Secondary | ICD-10-CM | POA: Diagnosis not present

## 2018-07-13 DIAGNOSIS — N186 End stage renal disease: Secondary | ICD-10-CM | POA: Diagnosis not present

## 2018-07-13 DIAGNOSIS — D631 Anemia in chronic kidney disease: Secondary | ICD-10-CM | POA: Diagnosis not present

## 2018-07-14 DIAGNOSIS — E785 Hyperlipidemia, unspecified: Secondary | ICD-10-CM | POA: Diagnosis not present

## 2018-07-14 DIAGNOSIS — D631 Anemia in chronic kidney disease: Secondary | ICD-10-CM | POA: Diagnosis not present

## 2018-07-14 DIAGNOSIS — N2581 Secondary hyperparathyroidism of renal origin: Secondary | ICD-10-CM | POA: Diagnosis not present

## 2018-07-14 DIAGNOSIS — N186 End stage renal disease: Secondary | ICD-10-CM | POA: Diagnosis not present

## 2018-07-15 DIAGNOSIS — N2581 Secondary hyperparathyroidism of renal origin: Secondary | ICD-10-CM | POA: Diagnosis not present

## 2018-07-15 DIAGNOSIS — E785 Hyperlipidemia, unspecified: Secondary | ICD-10-CM | POA: Diagnosis not present

## 2018-07-15 DIAGNOSIS — D631 Anemia in chronic kidney disease: Secondary | ICD-10-CM | POA: Diagnosis not present

## 2018-07-15 DIAGNOSIS — N186 End stage renal disease: Secondary | ICD-10-CM | POA: Diagnosis not present

## 2018-07-16 DIAGNOSIS — D631 Anemia in chronic kidney disease: Secondary | ICD-10-CM | POA: Diagnosis not present

## 2018-07-16 DIAGNOSIS — E785 Hyperlipidemia, unspecified: Secondary | ICD-10-CM | POA: Diagnosis not present

## 2018-07-16 DIAGNOSIS — N186 End stage renal disease: Secondary | ICD-10-CM | POA: Diagnosis not present

## 2018-07-16 DIAGNOSIS — N2581 Secondary hyperparathyroidism of renal origin: Secondary | ICD-10-CM | POA: Diagnosis not present

## 2018-07-17 DIAGNOSIS — E785 Hyperlipidemia, unspecified: Secondary | ICD-10-CM | POA: Diagnosis not present

## 2018-07-17 DIAGNOSIS — D631 Anemia in chronic kidney disease: Secondary | ICD-10-CM | POA: Diagnosis not present

## 2018-07-17 DIAGNOSIS — N2581 Secondary hyperparathyroidism of renal origin: Secondary | ICD-10-CM | POA: Diagnosis not present

## 2018-07-17 DIAGNOSIS — N186 End stage renal disease: Secondary | ICD-10-CM | POA: Diagnosis not present

## 2018-07-18 DIAGNOSIS — E785 Hyperlipidemia, unspecified: Secondary | ICD-10-CM | POA: Diagnosis not present

## 2018-07-18 DIAGNOSIS — N186 End stage renal disease: Secondary | ICD-10-CM | POA: Diagnosis not present

## 2018-07-18 DIAGNOSIS — N2581 Secondary hyperparathyroidism of renal origin: Secondary | ICD-10-CM | POA: Diagnosis not present

## 2018-07-18 DIAGNOSIS — D631 Anemia in chronic kidney disease: Secondary | ICD-10-CM | POA: Diagnosis not present

## 2018-07-19 DIAGNOSIS — N2581 Secondary hyperparathyroidism of renal origin: Secondary | ICD-10-CM | POA: Diagnosis not present

## 2018-07-19 DIAGNOSIS — D631 Anemia in chronic kidney disease: Secondary | ICD-10-CM | POA: Diagnosis not present

## 2018-07-19 DIAGNOSIS — E785 Hyperlipidemia, unspecified: Secondary | ICD-10-CM | POA: Diagnosis not present

## 2018-07-19 DIAGNOSIS — N186 End stage renal disease: Secondary | ICD-10-CM | POA: Diagnosis not present

## 2018-07-20 DIAGNOSIS — N2581 Secondary hyperparathyroidism of renal origin: Secondary | ICD-10-CM | POA: Diagnosis not present

## 2018-07-20 DIAGNOSIS — N186 End stage renal disease: Secondary | ICD-10-CM | POA: Diagnosis not present

## 2018-07-20 DIAGNOSIS — E785 Hyperlipidemia, unspecified: Secondary | ICD-10-CM | POA: Diagnosis not present

## 2018-07-20 DIAGNOSIS — D631 Anemia in chronic kidney disease: Secondary | ICD-10-CM | POA: Diagnosis not present

## 2018-07-21 DIAGNOSIS — E785 Hyperlipidemia, unspecified: Secondary | ICD-10-CM | POA: Diagnosis not present

## 2018-07-21 DIAGNOSIS — N2581 Secondary hyperparathyroidism of renal origin: Secondary | ICD-10-CM | POA: Diagnosis not present

## 2018-07-21 DIAGNOSIS — N186 End stage renal disease: Secondary | ICD-10-CM | POA: Diagnosis not present

## 2018-07-21 DIAGNOSIS — D631 Anemia in chronic kidney disease: Secondary | ICD-10-CM | POA: Diagnosis not present

## 2018-07-22 DIAGNOSIS — E785 Hyperlipidemia, unspecified: Secondary | ICD-10-CM | POA: Diagnosis not present

## 2018-07-22 DIAGNOSIS — N2581 Secondary hyperparathyroidism of renal origin: Secondary | ICD-10-CM | POA: Diagnosis not present

## 2018-07-22 DIAGNOSIS — D631 Anemia in chronic kidney disease: Secondary | ICD-10-CM | POA: Diagnosis not present

## 2018-07-22 DIAGNOSIS — N186 End stage renal disease: Secondary | ICD-10-CM | POA: Diagnosis not present

## 2018-07-23 DIAGNOSIS — N186 End stage renal disease: Secondary | ICD-10-CM | POA: Diagnosis not present

## 2018-07-23 DIAGNOSIS — N2581 Secondary hyperparathyroidism of renal origin: Secondary | ICD-10-CM | POA: Diagnosis not present

## 2018-07-23 DIAGNOSIS — E785 Hyperlipidemia, unspecified: Secondary | ICD-10-CM | POA: Diagnosis not present

## 2018-07-23 DIAGNOSIS — D631 Anemia in chronic kidney disease: Secondary | ICD-10-CM | POA: Diagnosis not present

## 2018-07-24 DIAGNOSIS — N2581 Secondary hyperparathyroidism of renal origin: Secondary | ICD-10-CM | POA: Diagnosis not present

## 2018-07-24 DIAGNOSIS — N186 End stage renal disease: Secondary | ICD-10-CM | POA: Diagnosis not present

## 2018-07-24 DIAGNOSIS — E785 Hyperlipidemia, unspecified: Secondary | ICD-10-CM | POA: Diagnosis not present

## 2018-07-24 DIAGNOSIS — D631 Anemia in chronic kidney disease: Secondary | ICD-10-CM | POA: Diagnosis not present

## 2018-07-24 DIAGNOSIS — Z992 Dependence on renal dialysis: Secondary | ICD-10-CM | POA: Diagnosis not present

## 2018-07-25 DIAGNOSIS — N186 End stage renal disease: Secondary | ICD-10-CM | POA: Diagnosis not present

## 2018-07-25 DIAGNOSIS — N2581 Secondary hyperparathyroidism of renal origin: Secondary | ICD-10-CM | POA: Diagnosis not present

## 2018-07-25 DIAGNOSIS — D631 Anemia in chronic kidney disease: Secondary | ICD-10-CM | POA: Diagnosis not present

## 2018-07-26 DIAGNOSIS — D631 Anemia in chronic kidney disease: Secondary | ICD-10-CM | POA: Diagnosis not present

## 2018-07-26 DIAGNOSIS — N186 End stage renal disease: Secondary | ICD-10-CM | POA: Diagnosis not present

## 2018-07-26 DIAGNOSIS — N2581 Secondary hyperparathyroidism of renal origin: Secondary | ICD-10-CM | POA: Diagnosis not present

## 2018-07-27 DIAGNOSIS — N2581 Secondary hyperparathyroidism of renal origin: Secondary | ICD-10-CM | POA: Diagnosis not present

## 2018-07-27 DIAGNOSIS — N186 End stage renal disease: Secondary | ICD-10-CM | POA: Diagnosis not present

## 2018-07-27 DIAGNOSIS — D631 Anemia in chronic kidney disease: Secondary | ICD-10-CM | POA: Diagnosis not present

## 2018-07-28 DIAGNOSIS — D631 Anemia in chronic kidney disease: Secondary | ICD-10-CM | POA: Diagnosis not present

## 2018-07-28 DIAGNOSIS — N2581 Secondary hyperparathyroidism of renal origin: Secondary | ICD-10-CM | POA: Diagnosis not present

## 2018-07-28 DIAGNOSIS — N186 End stage renal disease: Secondary | ICD-10-CM | POA: Diagnosis not present

## 2018-07-29 DIAGNOSIS — Z1159 Encounter for screening for other viral diseases: Secondary | ICD-10-CM | POA: Diagnosis not present

## 2018-07-29 DIAGNOSIS — D631 Anemia in chronic kidney disease: Secondary | ICD-10-CM | POA: Diagnosis not present

## 2018-07-29 DIAGNOSIS — Z114 Encounter for screening for human immunodeficiency virus [HIV]: Secondary | ICD-10-CM | POA: Diagnosis not present

## 2018-07-29 DIAGNOSIS — E119 Type 2 diabetes mellitus without complications: Secondary | ICD-10-CM | POA: Diagnosis not present

## 2018-07-29 DIAGNOSIS — Z4932 Encounter for adequacy testing for peritoneal dialysis: Secondary | ICD-10-CM | POA: Diagnosis not present

## 2018-07-29 DIAGNOSIS — N186 End stage renal disease: Secondary | ICD-10-CM | POA: Diagnosis not present

## 2018-07-29 DIAGNOSIS — N2581 Secondary hyperparathyroidism of renal origin: Secondary | ICD-10-CM | POA: Diagnosis not present

## 2018-07-30 DIAGNOSIS — N2581 Secondary hyperparathyroidism of renal origin: Secondary | ICD-10-CM | POA: Diagnosis not present

## 2018-07-30 DIAGNOSIS — D631 Anemia in chronic kidney disease: Secondary | ICD-10-CM | POA: Diagnosis not present

## 2018-07-30 DIAGNOSIS — N186 End stage renal disease: Secondary | ICD-10-CM | POA: Diagnosis not present

## 2018-07-31 DIAGNOSIS — D631 Anemia in chronic kidney disease: Secondary | ICD-10-CM | POA: Diagnosis not present

## 2018-07-31 DIAGNOSIS — N2581 Secondary hyperparathyroidism of renal origin: Secondary | ICD-10-CM | POA: Diagnosis not present

## 2018-07-31 DIAGNOSIS — N186 End stage renal disease: Secondary | ICD-10-CM | POA: Diagnosis not present

## 2018-08-01 DIAGNOSIS — N2581 Secondary hyperparathyroidism of renal origin: Secondary | ICD-10-CM | POA: Diagnosis not present

## 2018-08-01 DIAGNOSIS — N186 End stage renal disease: Secondary | ICD-10-CM | POA: Diagnosis not present

## 2018-08-01 DIAGNOSIS — D631 Anemia in chronic kidney disease: Secondary | ICD-10-CM | POA: Diagnosis not present

## 2018-08-02 DIAGNOSIS — N2581 Secondary hyperparathyroidism of renal origin: Secondary | ICD-10-CM | POA: Diagnosis not present

## 2018-08-02 DIAGNOSIS — D631 Anemia in chronic kidney disease: Secondary | ICD-10-CM | POA: Diagnosis not present

## 2018-08-02 DIAGNOSIS — N186 End stage renal disease: Secondary | ICD-10-CM | POA: Diagnosis not present

## 2018-08-03 DIAGNOSIS — N186 End stage renal disease: Secondary | ICD-10-CM | POA: Diagnosis not present

## 2018-08-03 DIAGNOSIS — N2581 Secondary hyperparathyroidism of renal origin: Secondary | ICD-10-CM | POA: Diagnosis not present

## 2018-08-03 DIAGNOSIS — D631 Anemia in chronic kidney disease: Secondary | ICD-10-CM | POA: Diagnosis not present

## 2018-08-04 DIAGNOSIS — D631 Anemia in chronic kidney disease: Secondary | ICD-10-CM | POA: Diagnosis not present

## 2018-08-04 DIAGNOSIS — N2581 Secondary hyperparathyroidism of renal origin: Secondary | ICD-10-CM | POA: Diagnosis not present

## 2018-08-04 DIAGNOSIS — N186 End stage renal disease: Secondary | ICD-10-CM | POA: Diagnosis not present

## 2018-08-05 DIAGNOSIS — N186 End stage renal disease: Secondary | ICD-10-CM | POA: Diagnosis not present

## 2018-08-05 DIAGNOSIS — N2581 Secondary hyperparathyroidism of renal origin: Secondary | ICD-10-CM | POA: Diagnosis not present

## 2018-08-05 DIAGNOSIS — D631 Anemia in chronic kidney disease: Secondary | ICD-10-CM | POA: Diagnosis not present

## 2018-08-06 DIAGNOSIS — N186 End stage renal disease: Secondary | ICD-10-CM | POA: Diagnosis not present

## 2018-08-06 DIAGNOSIS — N2581 Secondary hyperparathyroidism of renal origin: Secondary | ICD-10-CM | POA: Diagnosis not present

## 2018-08-06 DIAGNOSIS — D631 Anemia in chronic kidney disease: Secondary | ICD-10-CM | POA: Diagnosis not present

## 2018-08-07 DIAGNOSIS — N2581 Secondary hyperparathyroidism of renal origin: Secondary | ICD-10-CM | POA: Diagnosis not present

## 2018-08-07 DIAGNOSIS — D631 Anemia in chronic kidney disease: Secondary | ICD-10-CM | POA: Diagnosis not present

## 2018-08-07 DIAGNOSIS — N186 End stage renal disease: Secondary | ICD-10-CM | POA: Diagnosis not present

## 2018-08-08 DIAGNOSIS — N2581 Secondary hyperparathyroidism of renal origin: Secondary | ICD-10-CM | POA: Diagnosis not present

## 2018-08-08 DIAGNOSIS — N186 End stage renal disease: Secondary | ICD-10-CM | POA: Diagnosis not present

## 2018-08-08 DIAGNOSIS — D631 Anemia in chronic kidney disease: Secondary | ICD-10-CM | POA: Diagnosis not present

## 2018-08-09 DIAGNOSIS — N2581 Secondary hyperparathyroidism of renal origin: Secondary | ICD-10-CM | POA: Diagnosis not present

## 2018-08-09 DIAGNOSIS — D631 Anemia in chronic kidney disease: Secondary | ICD-10-CM | POA: Diagnosis not present

## 2018-08-09 DIAGNOSIS — N186 End stage renal disease: Secondary | ICD-10-CM | POA: Diagnosis not present

## 2018-08-10 DIAGNOSIS — D631 Anemia in chronic kidney disease: Secondary | ICD-10-CM | POA: Diagnosis not present

## 2018-08-10 DIAGNOSIS — N2581 Secondary hyperparathyroidism of renal origin: Secondary | ICD-10-CM | POA: Diagnosis not present

## 2018-08-10 DIAGNOSIS — N186 End stage renal disease: Secondary | ICD-10-CM | POA: Diagnosis not present

## 2018-08-11 DIAGNOSIS — D631 Anemia in chronic kidney disease: Secondary | ICD-10-CM | POA: Diagnosis not present

## 2018-08-11 DIAGNOSIS — N186 End stage renal disease: Secondary | ICD-10-CM | POA: Diagnosis not present

## 2018-08-11 DIAGNOSIS — N2581 Secondary hyperparathyroidism of renal origin: Secondary | ICD-10-CM | POA: Diagnosis not present

## 2018-08-12 DIAGNOSIS — D631 Anemia in chronic kidney disease: Secondary | ICD-10-CM | POA: Diagnosis not present

## 2018-08-12 DIAGNOSIS — N186 End stage renal disease: Secondary | ICD-10-CM | POA: Diagnosis not present

## 2018-08-12 DIAGNOSIS — N2581 Secondary hyperparathyroidism of renal origin: Secondary | ICD-10-CM | POA: Diagnosis not present

## 2018-08-13 DIAGNOSIS — N2581 Secondary hyperparathyroidism of renal origin: Secondary | ICD-10-CM | POA: Diagnosis not present

## 2018-08-13 DIAGNOSIS — D631 Anemia in chronic kidney disease: Secondary | ICD-10-CM | POA: Diagnosis not present

## 2018-08-13 DIAGNOSIS — N186 End stage renal disease: Secondary | ICD-10-CM | POA: Diagnosis not present

## 2018-08-14 DIAGNOSIS — N186 End stage renal disease: Secondary | ICD-10-CM | POA: Diagnosis not present

## 2018-08-14 DIAGNOSIS — N2581 Secondary hyperparathyroidism of renal origin: Secondary | ICD-10-CM | POA: Diagnosis not present

## 2018-08-14 DIAGNOSIS — D631 Anemia in chronic kidney disease: Secondary | ICD-10-CM | POA: Diagnosis not present

## 2018-08-15 DIAGNOSIS — D631 Anemia in chronic kidney disease: Secondary | ICD-10-CM | POA: Diagnosis not present

## 2018-08-15 DIAGNOSIS — N2581 Secondary hyperparathyroidism of renal origin: Secondary | ICD-10-CM | POA: Diagnosis not present

## 2018-08-15 DIAGNOSIS — N186 End stage renal disease: Secondary | ICD-10-CM | POA: Diagnosis not present

## 2018-08-16 DIAGNOSIS — D631 Anemia in chronic kidney disease: Secondary | ICD-10-CM | POA: Diagnosis not present

## 2018-08-16 DIAGNOSIS — N186 End stage renal disease: Secondary | ICD-10-CM | POA: Diagnosis not present

## 2018-08-16 DIAGNOSIS — N2581 Secondary hyperparathyroidism of renal origin: Secondary | ICD-10-CM | POA: Diagnosis not present

## 2018-08-17 DIAGNOSIS — N186 End stage renal disease: Secondary | ICD-10-CM | POA: Diagnosis not present

## 2018-08-17 DIAGNOSIS — D631 Anemia in chronic kidney disease: Secondary | ICD-10-CM | POA: Diagnosis not present

## 2018-08-17 DIAGNOSIS — N2581 Secondary hyperparathyroidism of renal origin: Secondary | ICD-10-CM | POA: Diagnosis not present

## 2018-08-18 DIAGNOSIS — D631 Anemia in chronic kidney disease: Secondary | ICD-10-CM | POA: Diagnosis not present

## 2018-08-18 DIAGNOSIS — N186 End stage renal disease: Secondary | ICD-10-CM | POA: Diagnosis not present

## 2018-08-18 DIAGNOSIS — N2581 Secondary hyperparathyroidism of renal origin: Secondary | ICD-10-CM | POA: Diagnosis not present

## 2018-08-19 DIAGNOSIS — D631 Anemia in chronic kidney disease: Secondary | ICD-10-CM | POA: Diagnosis not present

## 2018-08-19 DIAGNOSIS — N186 End stage renal disease: Secondary | ICD-10-CM | POA: Diagnosis not present

## 2018-08-19 DIAGNOSIS — N2581 Secondary hyperparathyroidism of renal origin: Secondary | ICD-10-CM | POA: Diagnosis not present

## 2018-08-20 DIAGNOSIS — D631 Anemia in chronic kidney disease: Secondary | ICD-10-CM | POA: Diagnosis not present

## 2018-08-20 DIAGNOSIS — N186 End stage renal disease: Secondary | ICD-10-CM | POA: Diagnosis not present

## 2018-08-20 DIAGNOSIS — N2581 Secondary hyperparathyroidism of renal origin: Secondary | ICD-10-CM | POA: Diagnosis not present

## 2018-08-21 DIAGNOSIS — N2581 Secondary hyperparathyroidism of renal origin: Secondary | ICD-10-CM | POA: Diagnosis not present

## 2018-08-21 DIAGNOSIS — N186 End stage renal disease: Secondary | ICD-10-CM | POA: Diagnosis not present

## 2018-08-21 DIAGNOSIS — D631 Anemia in chronic kidney disease: Secondary | ICD-10-CM | POA: Diagnosis not present

## 2018-08-22 DIAGNOSIS — N186 End stage renal disease: Secondary | ICD-10-CM | POA: Diagnosis not present

## 2018-08-22 DIAGNOSIS — D631 Anemia in chronic kidney disease: Secondary | ICD-10-CM | POA: Diagnosis not present

## 2018-08-22 DIAGNOSIS — N2581 Secondary hyperparathyroidism of renal origin: Secondary | ICD-10-CM | POA: Diagnosis not present

## 2018-08-23 DIAGNOSIS — D631 Anemia in chronic kidney disease: Secondary | ICD-10-CM | POA: Diagnosis not present

## 2018-08-23 DIAGNOSIS — N2581 Secondary hyperparathyroidism of renal origin: Secondary | ICD-10-CM | POA: Diagnosis not present

## 2018-08-23 DIAGNOSIS — N186 End stage renal disease: Secondary | ICD-10-CM | POA: Diagnosis not present

## 2018-08-24 DIAGNOSIS — N186 End stage renal disease: Secondary | ICD-10-CM | POA: Diagnosis not present

## 2018-08-24 DIAGNOSIS — N2581 Secondary hyperparathyroidism of renal origin: Secondary | ICD-10-CM | POA: Diagnosis not present

## 2018-08-24 DIAGNOSIS — D631 Anemia in chronic kidney disease: Secondary | ICD-10-CM | POA: Diagnosis not present

## 2018-08-24 DIAGNOSIS — Z992 Dependence on renal dialysis: Secondary | ICD-10-CM | POA: Diagnosis not present

## 2018-08-25 DIAGNOSIS — N2581 Secondary hyperparathyroidism of renal origin: Secondary | ICD-10-CM | POA: Diagnosis not present

## 2018-08-25 DIAGNOSIS — N186 End stage renal disease: Secondary | ICD-10-CM | POA: Diagnosis not present

## 2018-08-25 DIAGNOSIS — D631 Anemia in chronic kidney disease: Secondary | ICD-10-CM | POA: Diagnosis not present

## 2018-08-26 DIAGNOSIS — N2581 Secondary hyperparathyroidism of renal origin: Secondary | ICD-10-CM | POA: Diagnosis not present

## 2018-08-26 DIAGNOSIS — D631 Anemia in chronic kidney disease: Secondary | ICD-10-CM | POA: Diagnosis not present

## 2018-08-26 DIAGNOSIS — N186 End stage renal disease: Secondary | ICD-10-CM | POA: Diagnosis not present

## 2018-08-27 DIAGNOSIS — D631 Anemia in chronic kidney disease: Secondary | ICD-10-CM | POA: Diagnosis not present

## 2018-08-27 DIAGNOSIS — N2581 Secondary hyperparathyroidism of renal origin: Secondary | ICD-10-CM | POA: Diagnosis not present

## 2018-08-27 DIAGNOSIS — N186 End stage renal disease: Secondary | ICD-10-CM | POA: Diagnosis not present

## 2018-08-28 DIAGNOSIS — D631 Anemia in chronic kidney disease: Secondary | ICD-10-CM | POA: Diagnosis not present

## 2018-08-28 DIAGNOSIS — N186 End stage renal disease: Secondary | ICD-10-CM | POA: Diagnosis not present

## 2018-08-28 DIAGNOSIS — N2581 Secondary hyperparathyroidism of renal origin: Secondary | ICD-10-CM | POA: Diagnosis not present

## 2018-08-29 DIAGNOSIS — D631 Anemia in chronic kidney disease: Secondary | ICD-10-CM | POA: Diagnosis not present

## 2018-08-29 DIAGNOSIS — N2581 Secondary hyperparathyroidism of renal origin: Secondary | ICD-10-CM | POA: Diagnosis not present

## 2018-08-29 DIAGNOSIS — E119 Type 2 diabetes mellitus without complications: Secondary | ICD-10-CM | POA: Diagnosis not present

## 2018-08-29 DIAGNOSIS — N186 End stage renal disease: Secondary | ICD-10-CM | POA: Diagnosis not present

## 2018-08-30 DIAGNOSIS — D631 Anemia in chronic kidney disease: Secondary | ICD-10-CM | POA: Diagnosis not present

## 2018-08-30 DIAGNOSIS — N2581 Secondary hyperparathyroidism of renal origin: Secondary | ICD-10-CM | POA: Diagnosis not present

## 2018-08-30 DIAGNOSIS — N186 End stage renal disease: Secondary | ICD-10-CM | POA: Diagnosis not present

## 2018-08-31 DIAGNOSIS — N186 End stage renal disease: Secondary | ICD-10-CM | POA: Diagnosis not present

## 2018-08-31 DIAGNOSIS — N2581 Secondary hyperparathyroidism of renal origin: Secondary | ICD-10-CM | POA: Diagnosis not present

## 2018-08-31 DIAGNOSIS — D631 Anemia in chronic kidney disease: Secondary | ICD-10-CM | POA: Diagnosis not present

## 2018-09-01 DIAGNOSIS — N186 End stage renal disease: Secondary | ICD-10-CM | POA: Diagnosis not present

## 2018-09-01 DIAGNOSIS — N2581 Secondary hyperparathyroidism of renal origin: Secondary | ICD-10-CM | POA: Diagnosis not present

## 2018-09-01 DIAGNOSIS — D631 Anemia in chronic kidney disease: Secondary | ICD-10-CM | POA: Diagnosis not present

## 2018-09-02 DIAGNOSIS — N186 End stage renal disease: Secondary | ICD-10-CM | POA: Diagnosis not present

## 2018-09-02 DIAGNOSIS — D631 Anemia in chronic kidney disease: Secondary | ICD-10-CM | POA: Diagnosis not present

## 2018-09-02 DIAGNOSIS — N2581 Secondary hyperparathyroidism of renal origin: Secondary | ICD-10-CM | POA: Diagnosis not present

## 2018-09-03 DIAGNOSIS — D631 Anemia in chronic kidney disease: Secondary | ICD-10-CM | POA: Diagnosis not present

## 2018-09-03 DIAGNOSIS — N2581 Secondary hyperparathyroidism of renal origin: Secondary | ICD-10-CM | POA: Diagnosis not present

## 2018-09-03 DIAGNOSIS — N186 End stage renal disease: Secondary | ICD-10-CM | POA: Diagnosis not present

## 2018-09-04 DIAGNOSIS — D631 Anemia in chronic kidney disease: Secondary | ICD-10-CM | POA: Diagnosis not present

## 2018-09-04 DIAGNOSIS — N186 End stage renal disease: Secondary | ICD-10-CM | POA: Diagnosis not present

## 2018-09-04 DIAGNOSIS — N2581 Secondary hyperparathyroidism of renal origin: Secondary | ICD-10-CM | POA: Diagnosis not present

## 2018-09-05 DIAGNOSIS — D631 Anemia in chronic kidney disease: Secondary | ICD-10-CM | POA: Diagnosis not present

## 2018-09-05 DIAGNOSIS — N2581 Secondary hyperparathyroidism of renal origin: Secondary | ICD-10-CM | POA: Diagnosis not present

## 2018-09-05 DIAGNOSIS — N186 End stage renal disease: Secondary | ICD-10-CM | POA: Diagnosis not present

## 2018-09-06 DIAGNOSIS — D631 Anemia in chronic kidney disease: Secondary | ICD-10-CM | POA: Diagnosis not present

## 2018-09-06 DIAGNOSIS — N2581 Secondary hyperparathyroidism of renal origin: Secondary | ICD-10-CM | POA: Diagnosis not present

## 2018-09-06 DIAGNOSIS — N186 End stage renal disease: Secondary | ICD-10-CM | POA: Diagnosis not present

## 2018-09-07 DIAGNOSIS — N2581 Secondary hyperparathyroidism of renal origin: Secondary | ICD-10-CM | POA: Diagnosis not present

## 2018-09-07 DIAGNOSIS — D631 Anemia in chronic kidney disease: Secondary | ICD-10-CM | POA: Diagnosis not present

## 2018-09-07 DIAGNOSIS — N186 End stage renal disease: Secondary | ICD-10-CM | POA: Diagnosis not present

## 2018-09-08 DIAGNOSIS — N186 End stage renal disease: Secondary | ICD-10-CM | POA: Diagnosis not present

## 2018-09-08 DIAGNOSIS — N2581 Secondary hyperparathyroidism of renal origin: Secondary | ICD-10-CM | POA: Diagnosis not present

## 2018-09-08 DIAGNOSIS — D631 Anemia in chronic kidney disease: Secondary | ICD-10-CM | POA: Diagnosis not present

## 2018-09-09 DIAGNOSIS — D631 Anemia in chronic kidney disease: Secondary | ICD-10-CM | POA: Diagnosis not present

## 2018-09-09 DIAGNOSIS — N2581 Secondary hyperparathyroidism of renal origin: Secondary | ICD-10-CM | POA: Diagnosis not present

## 2018-09-09 DIAGNOSIS — N186 End stage renal disease: Secondary | ICD-10-CM | POA: Diagnosis not present

## 2018-09-10 DIAGNOSIS — N2581 Secondary hyperparathyroidism of renal origin: Secondary | ICD-10-CM | POA: Diagnosis not present

## 2018-09-10 DIAGNOSIS — D631 Anemia in chronic kidney disease: Secondary | ICD-10-CM | POA: Diagnosis not present

## 2018-09-10 DIAGNOSIS — N186 End stage renal disease: Secondary | ICD-10-CM | POA: Diagnosis not present

## 2018-09-11 DIAGNOSIS — N186 End stage renal disease: Secondary | ICD-10-CM | POA: Diagnosis not present

## 2018-09-11 DIAGNOSIS — N2581 Secondary hyperparathyroidism of renal origin: Secondary | ICD-10-CM | POA: Diagnosis not present

## 2018-09-11 DIAGNOSIS — D631 Anemia in chronic kidney disease: Secondary | ICD-10-CM | POA: Diagnosis not present

## 2018-09-12 DIAGNOSIS — D631 Anemia in chronic kidney disease: Secondary | ICD-10-CM | POA: Diagnosis not present

## 2018-09-12 DIAGNOSIS — N2581 Secondary hyperparathyroidism of renal origin: Secondary | ICD-10-CM | POA: Diagnosis not present

## 2018-09-12 DIAGNOSIS — N186 End stage renal disease: Secondary | ICD-10-CM | POA: Diagnosis not present

## 2018-09-13 DIAGNOSIS — D631 Anemia in chronic kidney disease: Secondary | ICD-10-CM | POA: Diagnosis not present

## 2018-09-13 DIAGNOSIS — N186 End stage renal disease: Secondary | ICD-10-CM | POA: Diagnosis not present

## 2018-09-13 DIAGNOSIS — N2581 Secondary hyperparathyroidism of renal origin: Secondary | ICD-10-CM | POA: Diagnosis not present

## 2018-09-14 DIAGNOSIS — N186 End stage renal disease: Secondary | ICD-10-CM | POA: Diagnosis not present

## 2018-09-14 DIAGNOSIS — N2581 Secondary hyperparathyroidism of renal origin: Secondary | ICD-10-CM | POA: Diagnosis not present

## 2018-09-14 DIAGNOSIS — D631 Anemia in chronic kidney disease: Secondary | ICD-10-CM | POA: Diagnosis not present

## 2018-09-15 DIAGNOSIS — N2581 Secondary hyperparathyroidism of renal origin: Secondary | ICD-10-CM | POA: Diagnosis not present

## 2018-09-15 DIAGNOSIS — D631 Anemia in chronic kidney disease: Secondary | ICD-10-CM | POA: Diagnosis not present

## 2018-09-15 DIAGNOSIS — N186 End stage renal disease: Secondary | ICD-10-CM | POA: Diagnosis not present

## 2018-09-16 DIAGNOSIS — D631 Anemia in chronic kidney disease: Secondary | ICD-10-CM | POA: Diagnosis not present

## 2018-09-16 DIAGNOSIS — N186 End stage renal disease: Secondary | ICD-10-CM | POA: Diagnosis not present

## 2018-09-16 DIAGNOSIS — N2581 Secondary hyperparathyroidism of renal origin: Secondary | ICD-10-CM | POA: Diagnosis not present

## 2018-09-17 DIAGNOSIS — N186 End stage renal disease: Secondary | ICD-10-CM | POA: Diagnosis not present

## 2018-09-17 DIAGNOSIS — D631 Anemia in chronic kidney disease: Secondary | ICD-10-CM | POA: Diagnosis not present

## 2018-09-17 DIAGNOSIS — N2581 Secondary hyperparathyroidism of renal origin: Secondary | ICD-10-CM | POA: Diagnosis not present

## 2018-09-18 DIAGNOSIS — D631 Anemia in chronic kidney disease: Secondary | ICD-10-CM | POA: Diagnosis not present

## 2018-09-18 DIAGNOSIS — N2581 Secondary hyperparathyroidism of renal origin: Secondary | ICD-10-CM | POA: Diagnosis not present

## 2018-09-18 DIAGNOSIS — N186 End stage renal disease: Secondary | ICD-10-CM | POA: Diagnosis not present

## 2018-09-19 DIAGNOSIS — N2581 Secondary hyperparathyroidism of renal origin: Secondary | ICD-10-CM | POA: Diagnosis not present

## 2018-09-19 DIAGNOSIS — D631 Anemia in chronic kidney disease: Secondary | ICD-10-CM | POA: Diagnosis not present

## 2018-09-19 DIAGNOSIS — N186 End stage renal disease: Secondary | ICD-10-CM | POA: Diagnosis not present

## 2018-09-20 DIAGNOSIS — D631 Anemia in chronic kidney disease: Secondary | ICD-10-CM | POA: Diagnosis not present

## 2018-09-20 DIAGNOSIS — N2581 Secondary hyperparathyroidism of renal origin: Secondary | ICD-10-CM | POA: Diagnosis not present

## 2018-09-20 DIAGNOSIS — N186 End stage renal disease: Secondary | ICD-10-CM | POA: Diagnosis not present

## 2018-09-21 DIAGNOSIS — N2581 Secondary hyperparathyroidism of renal origin: Secondary | ICD-10-CM | POA: Diagnosis not present

## 2018-09-21 DIAGNOSIS — D631 Anemia in chronic kidney disease: Secondary | ICD-10-CM | POA: Diagnosis not present

## 2018-09-21 DIAGNOSIS — N186 End stage renal disease: Secondary | ICD-10-CM | POA: Diagnosis not present

## 2018-09-22 DIAGNOSIS — D631 Anemia in chronic kidney disease: Secondary | ICD-10-CM | POA: Diagnosis not present

## 2018-09-22 DIAGNOSIS — N2581 Secondary hyperparathyroidism of renal origin: Secondary | ICD-10-CM | POA: Diagnosis not present

## 2018-09-22 DIAGNOSIS — N186 End stage renal disease: Secondary | ICD-10-CM | POA: Diagnosis not present

## 2018-09-23 DIAGNOSIS — N186 End stage renal disease: Secondary | ICD-10-CM | POA: Diagnosis not present

## 2018-09-23 DIAGNOSIS — D631 Anemia in chronic kidney disease: Secondary | ICD-10-CM | POA: Diagnosis not present

## 2018-09-23 DIAGNOSIS — N2581 Secondary hyperparathyroidism of renal origin: Secondary | ICD-10-CM | POA: Diagnosis not present

## 2018-09-24 DIAGNOSIS — D509 Iron deficiency anemia, unspecified: Secondary | ICD-10-CM | POA: Diagnosis not present

## 2018-09-24 DIAGNOSIS — E785 Hyperlipidemia, unspecified: Secondary | ICD-10-CM | POA: Diagnosis not present

## 2018-09-24 DIAGNOSIS — D631 Anemia in chronic kidney disease: Secondary | ICD-10-CM | POA: Diagnosis not present

## 2018-09-24 DIAGNOSIS — N2581 Secondary hyperparathyroidism of renal origin: Secondary | ICD-10-CM | POA: Diagnosis not present

## 2018-09-24 DIAGNOSIS — N186 End stage renal disease: Secondary | ICD-10-CM | POA: Diagnosis not present

## 2018-09-25 DIAGNOSIS — E785 Hyperlipidemia, unspecified: Secondary | ICD-10-CM | POA: Diagnosis not present

## 2018-09-25 DIAGNOSIS — D631 Anemia in chronic kidney disease: Secondary | ICD-10-CM | POA: Diagnosis not present

## 2018-09-25 DIAGNOSIS — N186 End stage renal disease: Secondary | ICD-10-CM | POA: Diagnosis not present

## 2018-09-25 DIAGNOSIS — D509 Iron deficiency anemia, unspecified: Secondary | ICD-10-CM | POA: Diagnosis not present

## 2018-09-25 DIAGNOSIS — N2581 Secondary hyperparathyroidism of renal origin: Secondary | ICD-10-CM | POA: Diagnosis not present

## 2018-09-26 DIAGNOSIS — E785 Hyperlipidemia, unspecified: Secondary | ICD-10-CM | POA: Diagnosis not present

## 2018-09-26 DIAGNOSIS — D509 Iron deficiency anemia, unspecified: Secondary | ICD-10-CM | POA: Diagnosis not present

## 2018-09-26 DIAGNOSIS — D631 Anemia in chronic kidney disease: Secondary | ICD-10-CM | POA: Diagnosis not present

## 2018-09-26 DIAGNOSIS — N186 End stage renal disease: Secondary | ICD-10-CM | POA: Diagnosis not present

## 2018-09-26 DIAGNOSIS — N2581 Secondary hyperparathyroidism of renal origin: Secondary | ICD-10-CM | POA: Diagnosis not present

## 2018-09-27 DIAGNOSIS — N186 End stage renal disease: Secondary | ICD-10-CM | POA: Diagnosis not present

## 2018-09-27 DIAGNOSIS — D509 Iron deficiency anemia, unspecified: Secondary | ICD-10-CM | POA: Diagnosis not present

## 2018-09-27 DIAGNOSIS — E785 Hyperlipidemia, unspecified: Secondary | ICD-10-CM | POA: Diagnosis not present

## 2018-09-27 DIAGNOSIS — N2581 Secondary hyperparathyroidism of renal origin: Secondary | ICD-10-CM | POA: Diagnosis not present

## 2018-09-27 DIAGNOSIS — D631 Anemia in chronic kidney disease: Secondary | ICD-10-CM | POA: Diagnosis not present

## 2018-09-28 DIAGNOSIS — E785 Hyperlipidemia, unspecified: Secondary | ICD-10-CM | POA: Diagnosis not present

## 2018-09-28 DIAGNOSIS — D631 Anemia in chronic kidney disease: Secondary | ICD-10-CM | POA: Diagnosis not present

## 2018-09-28 DIAGNOSIS — D509 Iron deficiency anemia, unspecified: Secondary | ICD-10-CM | POA: Diagnosis not present

## 2018-09-28 DIAGNOSIS — N186 End stage renal disease: Secondary | ICD-10-CM | POA: Diagnosis not present

## 2018-09-28 DIAGNOSIS — N2581 Secondary hyperparathyroidism of renal origin: Secondary | ICD-10-CM | POA: Diagnosis not present

## 2018-09-29 DIAGNOSIS — D631 Anemia in chronic kidney disease: Secondary | ICD-10-CM | POA: Diagnosis not present

## 2018-09-29 DIAGNOSIS — N2581 Secondary hyperparathyroidism of renal origin: Secondary | ICD-10-CM | POA: Diagnosis not present

## 2018-09-29 DIAGNOSIS — E785 Hyperlipidemia, unspecified: Secondary | ICD-10-CM | POA: Diagnosis not present

## 2018-09-29 DIAGNOSIS — D509 Iron deficiency anemia, unspecified: Secondary | ICD-10-CM | POA: Diagnosis not present

## 2018-09-29 DIAGNOSIS — N186 End stage renal disease: Secondary | ICD-10-CM | POA: Diagnosis not present

## 2018-09-30 DIAGNOSIS — N2581 Secondary hyperparathyroidism of renal origin: Secondary | ICD-10-CM | POA: Diagnosis not present

## 2018-09-30 DIAGNOSIS — D631 Anemia in chronic kidney disease: Secondary | ICD-10-CM | POA: Diagnosis not present

## 2018-09-30 DIAGNOSIS — N186 End stage renal disease: Secondary | ICD-10-CM | POA: Diagnosis not present

## 2018-09-30 DIAGNOSIS — D509 Iron deficiency anemia, unspecified: Secondary | ICD-10-CM | POA: Diagnosis not present

## 2018-09-30 DIAGNOSIS — E785 Hyperlipidemia, unspecified: Secondary | ICD-10-CM | POA: Diagnosis not present

## 2018-10-01 DIAGNOSIS — E785 Hyperlipidemia, unspecified: Secondary | ICD-10-CM | POA: Diagnosis not present

## 2018-10-01 DIAGNOSIS — N2581 Secondary hyperparathyroidism of renal origin: Secondary | ICD-10-CM | POA: Diagnosis not present

## 2018-10-01 DIAGNOSIS — D509 Iron deficiency anemia, unspecified: Secondary | ICD-10-CM | POA: Diagnosis not present

## 2018-10-01 DIAGNOSIS — N186 End stage renal disease: Secondary | ICD-10-CM | POA: Diagnosis not present

## 2018-10-01 DIAGNOSIS — D631 Anemia in chronic kidney disease: Secondary | ICD-10-CM | POA: Diagnosis not present

## 2018-10-02 DIAGNOSIS — D509 Iron deficiency anemia, unspecified: Secondary | ICD-10-CM | POA: Diagnosis not present

## 2018-10-02 DIAGNOSIS — E785 Hyperlipidemia, unspecified: Secondary | ICD-10-CM | POA: Diagnosis not present

## 2018-10-02 DIAGNOSIS — N2581 Secondary hyperparathyroidism of renal origin: Secondary | ICD-10-CM | POA: Diagnosis not present

## 2018-10-02 DIAGNOSIS — D631 Anemia in chronic kidney disease: Secondary | ICD-10-CM | POA: Diagnosis not present

## 2018-10-02 DIAGNOSIS — N186 End stage renal disease: Secondary | ICD-10-CM | POA: Diagnosis not present

## 2018-10-03 DIAGNOSIS — N2581 Secondary hyperparathyroidism of renal origin: Secondary | ICD-10-CM | POA: Diagnosis not present

## 2018-10-03 DIAGNOSIS — D631 Anemia in chronic kidney disease: Secondary | ICD-10-CM | POA: Diagnosis not present

## 2018-10-03 DIAGNOSIS — E785 Hyperlipidemia, unspecified: Secondary | ICD-10-CM | POA: Diagnosis not present

## 2018-10-03 DIAGNOSIS — N186 End stage renal disease: Secondary | ICD-10-CM | POA: Diagnosis not present

## 2018-10-03 DIAGNOSIS — D509 Iron deficiency anemia, unspecified: Secondary | ICD-10-CM | POA: Diagnosis not present

## 2018-10-04 DIAGNOSIS — B958 Unspecified staphylococcus as the cause of diseases classified elsewhere: Secondary | ICD-10-CM | POA: Diagnosis present

## 2018-10-04 DIAGNOSIS — Z880 Allergy status to penicillin: Secondary | ICD-10-CM | POA: Diagnosis not present

## 2018-10-04 DIAGNOSIS — I10 Essential (primary) hypertension: Secondary | ICD-10-CM | POA: Diagnosis not present

## 2018-10-04 DIAGNOSIS — E1129 Type 2 diabetes mellitus with other diabetic kidney complication: Secondary | ICD-10-CM | POA: Diagnosis not present

## 2018-10-04 DIAGNOSIS — N186 End stage renal disease: Secondary | ICD-10-CM | POA: Diagnosis not present

## 2018-10-04 DIAGNOSIS — K659 Peritonitis, unspecified: Secondary | ICD-10-CM | POA: Diagnosis not present

## 2018-10-04 DIAGNOSIS — D631 Anemia in chronic kidney disease: Secondary | ICD-10-CM | POA: Diagnosis not present

## 2018-10-04 DIAGNOSIS — K5730 Diverticulosis of large intestine without perforation or abscess without bleeding: Secondary | ICD-10-CM | POA: Diagnosis not present

## 2018-10-04 DIAGNOSIS — R1032 Left lower quadrant pain: Secondary | ICD-10-CM | POA: Diagnosis not present

## 2018-10-04 DIAGNOSIS — E1122 Type 2 diabetes mellitus with diabetic chronic kidney disease: Secondary | ICD-10-CM | POA: Diagnosis not present

## 2018-10-04 DIAGNOSIS — Z79899 Other long term (current) drug therapy: Secondary | ICD-10-CM | POA: Diagnosis not present

## 2018-10-04 DIAGNOSIS — N3001 Acute cystitis with hematuria: Secondary | ICD-10-CM | POA: Diagnosis not present

## 2018-10-04 DIAGNOSIS — K5900 Constipation, unspecified: Secondary | ICD-10-CM | POA: Diagnosis present

## 2018-10-04 DIAGNOSIS — N19 Unspecified kidney failure: Secondary | ICD-10-CM | POA: Diagnosis not present

## 2018-10-04 DIAGNOSIS — L723 Sebaceous cyst: Secondary | ICD-10-CM | POA: Diagnosis not present

## 2018-10-04 DIAGNOSIS — R109 Unspecified abdominal pain: Secondary | ICD-10-CM | POA: Diagnosis not present

## 2018-10-04 DIAGNOSIS — G4733 Obstructive sleep apnea (adult) (pediatric): Secondary | ICD-10-CM | POA: Diagnosis not present

## 2018-10-04 DIAGNOSIS — Z9989 Dependence on other enabling machines and devices: Secondary | ICD-10-CM | POA: Diagnosis not present

## 2018-10-04 DIAGNOSIS — Z992 Dependence on renal dialysis: Secondary | ICD-10-CM | POA: Diagnosis not present

## 2018-10-04 DIAGNOSIS — Z87891 Personal history of nicotine dependence: Secondary | ICD-10-CM | POA: Diagnosis not present

## 2018-10-04 DIAGNOSIS — D72829 Elevated white blood cell count, unspecified: Secondary | ICD-10-CM | POA: Diagnosis not present

## 2018-10-04 DIAGNOSIS — K219 Gastro-esophageal reflux disease without esophagitis: Secondary | ICD-10-CM | POA: Diagnosis present

## 2018-10-04 DIAGNOSIS — R0902 Hypoxemia: Secondary | ICD-10-CM | POA: Diagnosis present

## 2018-10-04 DIAGNOSIS — N400 Enlarged prostate without lower urinary tract symptoms: Secondary | ICD-10-CM | POA: Diagnosis present

## 2018-10-04 DIAGNOSIS — N390 Urinary tract infection, site not specified: Secondary | ICD-10-CM | POA: Diagnosis not present

## 2018-10-04 DIAGNOSIS — I120 Hypertensive chronic kidney disease with stage 5 chronic kidney disease or end stage renal disease: Secondary | ICD-10-CM | POA: Diagnosis not present

## 2018-10-04 DIAGNOSIS — E1169 Type 2 diabetes mellitus with other specified complication: Secondary | ICD-10-CM | POA: Diagnosis not present

## 2018-10-04 DIAGNOSIS — E782 Mixed hyperlipidemia: Secondary | ICD-10-CM | POA: Diagnosis not present

## 2018-10-04 DIAGNOSIS — E669 Obesity, unspecified: Secondary | ICD-10-CM | POA: Diagnosis present

## 2018-10-04 DIAGNOSIS — Z794 Long term (current) use of insulin: Secondary | ICD-10-CM | POA: Diagnosis not present

## 2018-10-04 DIAGNOSIS — Z9114 Patient's other noncompliance with medication regimen: Secondary | ICD-10-CM | POA: Diagnosis not present

## 2018-10-04 DIAGNOSIS — D509 Iron deficiency anemia, unspecified: Secondary | ICD-10-CM | POA: Diagnosis not present

## 2018-10-04 DIAGNOSIS — Z1159 Encounter for screening for other viral diseases: Secondary | ICD-10-CM | POA: Diagnosis not present

## 2018-10-04 DIAGNOSIS — B952 Enterococcus as the cause of diseases classified elsewhere: Secondary | ICD-10-CM | POA: Diagnosis present

## 2018-10-04 DIAGNOSIS — Z9119 Patient's noncompliance with other medical treatment and regimen: Secondary | ICD-10-CM | POA: Diagnosis not present

## 2018-10-04 DIAGNOSIS — N2581 Secondary hyperparathyroidism of renal origin: Secondary | ICD-10-CM | POA: Diagnosis not present

## 2018-10-04 DIAGNOSIS — N320 Bladder-neck obstruction: Secondary | ICD-10-CM | POA: Diagnosis present

## 2018-10-04 DIAGNOSIS — D72828 Other elevated white blood cell count: Secondary | ICD-10-CM | POA: Diagnosis not present

## 2018-10-04 DIAGNOSIS — E785 Hyperlipidemia, unspecified: Secondary | ICD-10-CM | POA: Diagnosis not present

## 2018-10-09 DIAGNOSIS — N2581 Secondary hyperparathyroidism of renal origin: Secondary | ICD-10-CM | POA: Diagnosis not present

## 2018-10-09 DIAGNOSIS — D631 Anemia in chronic kidney disease: Secondary | ICD-10-CM | POA: Diagnosis not present

## 2018-10-09 DIAGNOSIS — N186 End stage renal disease: Secondary | ICD-10-CM | POA: Diagnosis not present

## 2018-10-09 DIAGNOSIS — D509 Iron deficiency anemia, unspecified: Secondary | ICD-10-CM | POA: Diagnosis not present

## 2018-10-09 DIAGNOSIS — E785 Hyperlipidemia, unspecified: Secondary | ICD-10-CM | POA: Diagnosis not present

## 2018-10-10 DIAGNOSIS — E785 Hyperlipidemia, unspecified: Secondary | ICD-10-CM | POA: Diagnosis not present

## 2018-10-10 DIAGNOSIS — N186 End stage renal disease: Secondary | ICD-10-CM | POA: Diagnosis not present

## 2018-10-10 DIAGNOSIS — D509 Iron deficiency anemia, unspecified: Secondary | ICD-10-CM | POA: Diagnosis not present

## 2018-10-10 DIAGNOSIS — N2581 Secondary hyperparathyroidism of renal origin: Secondary | ICD-10-CM | POA: Diagnosis not present

## 2018-10-10 DIAGNOSIS — D631 Anemia in chronic kidney disease: Secondary | ICD-10-CM | POA: Diagnosis not present

## 2018-10-11 DIAGNOSIS — N2581 Secondary hyperparathyroidism of renal origin: Secondary | ICD-10-CM | POA: Diagnosis not present

## 2018-10-11 DIAGNOSIS — E785 Hyperlipidemia, unspecified: Secondary | ICD-10-CM | POA: Diagnosis not present

## 2018-10-11 DIAGNOSIS — D509 Iron deficiency anemia, unspecified: Secondary | ICD-10-CM | POA: Diagnosis not present

## 2018-10-11 DIAGNOSIS — D631 Anemia in chronic kidney disease: Secondary | ICD-10-CM | POA: Diagnosis not present

## 2018-10-11 DIAGNOSIS — N186 End stage renal disease: Secondary | ICD-10-CM | POA: Diagnosis not present

## 2018-10-12 DIAGNOSIS — D631 Anemia in chronic kidney disease: Secondary | ICD-10-CM | POA: Diagnosis not present

## 2018-10-12 DIAGNOSIS — E785 Hyperlipidemia, unspecified: Secondary | ICD-10-CM | POA: Diagnosis not present

## 2018-10-12 DIAGNOSIS — D509 Iron deficiency anemia, unspecified: Secondary | ICD-10-CM | POA: Diagnosis not present

## 2018-10-12 DIAGNOSIS — N186 End stage renal disease: Secondary | ICD-10-CM | POA: Diagnosis not present

## 2018-10-12 DIAGNOSIS — N2581 Secondary hyperparathyroidism of renal origin: Secondary | ICD-10-CM | POA: Diagnosis not present

## 2018-10-13 DIAGNOSIS — D509 Iron deficiency anemia, unspecified: Secondary | ICD-10-CM | POA: Diagnosis not present

## 2018-10-13 DIAGNOSIS — D631 Anemia in chronic kidney disease: Secondary | ICD-10-CM | POA: Diagnosis not present

## 2018-10-13 DIAGNOSIS — N186 End stage renal disease: Secondary | ICD-10-CM | POA: Diagnosis not present

## 2018-10-13 DIAGNOSIS — N2581 Secondary hyperparathyroidism of renal origin: Secondary | ICD-10-CM | POA: Diagnosis not present

## 2018-10-13 DIAGNOSIS — E785 Hyperlipidemia, unspecified: Secondary | ICD-10-CM | POA: Diagnosis not present

## 2018-10-14 DIAGNOSIS — D631 Anemia in chronic kidney disease: Secondary | ICD-10-CM | POA: Diagnosis not present

## 2018-10-14 DIAGNOSIS — E785 Hyperlipidemia, unspecified: Secondary | ICD-10-CM | POA: Diagnosis not present

## 2018-10-14 DIAGNOSIS — N2581 Secondary hyperparathyroidism of renal origin: Secondary | ICD-10-CM | POA: Diagnosis not present

## 2018-10-14 DIAGNOSIS — N186 End stage renal disease: Secondary | ICD-10-CM | POA: Diagnosis not present

## 2018-10-14 DIAGNOSIS — D509 Iron deficiency anemia, unspecified: Secondary | ICD-10-CM | POA: Diagnosis not present

## 2018-10-15 DIAGNOSIS — D509 Iron deficiency anemia, unspecified: Secondary | ICD-10-CM | POA: Diagnosis not present

## 2018-10-15 DIAGNOSIS — N2581 Secondary hyperparathyroidism of renal origin: Secondary | ICD-10-CM | POA: Diagnosis not present

## 2018-10-15 DIAGNOSIS — N186 End stage renal disease: Secondary | ICD-10-CM | POA: Diagnosis not present

## 2018-10-15 DIAGNOSIS — D631 Anemia in chronic kidney disease: Secondary | ICD-10-CM | POA: Diagnosis not present

## 2018-10-15 DIAGNOSIS — E785 Hyperlipidemia, unspecified: Secondary | ICD-10-CM | POA: Diagnosis not present

## 2018-10-16 DIAGNOSIS — N2581 Secondary hyperparathyroidism of renal origin: Secondary | ICD-10-CM | POA: Diagnosis not present

## 2018-10-16 DIAGNOSIS — E785 Hyperlipidemia, unspecified: Secondary | ICD-10-CM | POA: Diagnosis not present

## 2018-10-16 DIAGNOSIS — N186 End stage renal disease: Secondary | ICD-10-CM | POA: Diagnosis not present

## 2018-10-16 DIAGNOSIS — D509 Iron deficiency anemia, unspecified: Secondary | ICD-10-CM | POA: Diagnosis not present

## 2018-10-16 DIAGNOSIS — D631 Anemia in chronic kidney disease: Secondary | ICD-10-CM | POA: Diagnosis not present

## 2018-10-17 DIAGNOSIS — E785 Hyperlipidemia, unspecified: Secondary | ICD-10-CM | POA: Diagnosis not present

## 2018-10-17 DIAGNOSIS — N2581 Secondary hyperparathyroidism of renal origin: Secondary | ICD-10-CM | POA: Diagnosis not present

## 2018-10-17 DIAGNOSIS — D509 Iron deficiency anemia, unspecified: Secondary | ICD-10-CM | POA: Diagnosis not present

## 2018-10-17 DIAGNOSIS — N186 End stage renal disease: Secondary | ICD-10-CM | POA: Diagnosis not present

## 2018-10-17 DIAGNOSIS — D631 Anemia in chronic kidney disease: Secondary | ICD-10-CM | POA: Diagnosis not present

## 2018-10-18 DIAGNOSIS — N186 End stage renal disease: Secondary | ICD-10-CM | POA: Diagnosis not present

## 2018-10-18 DIAGNOSIS — D631 Anemia in chronic kidney disease: Secondary | ICD-10-CM | POA: Diagnosis not present

## 2018-10-18 DIAGNOSIS — D509 Iron deficiency anemia, unspecified: Secondary | ICD-10-CM | POA: Diagnosis not present

## 2018-10-18 DIAGNOSIS — E785 Hyperlipidemia, unspecified: Secondary | ICD-10-CM | POA: Diagnosis not present

## 2018-10-18 DIAGNOSIS — N2581 Secondary hyperparathyroidism of renal origin: Secondary | ICD-10-CM | POA: Diagnosis not present

## 2018-10-19 DIAGNOSIS — N186 End stage renal disease: Secondary | ICD-10-CM | POA: Diagnosis not present

## 2018-10-19 DIAGNOSIS — D509 Iron deficiency anemia, unspecified: Secondary | ICD-10-CM | POA: Diagnosis not present

## 2018-10-19 DIAGNOSIS — D631 Anemia in chronic kidney disease: Secondary | ICD-10-CM | POA: Diagnosis not present

## 2018-10-19 DIAGNOSIS — N2581 Secondary hyperparathyroidism of renal origin: Secondary | ICD-10-CM | POA: Diagnosis not present

## 2018-10-19 DIAGNOSIS — E785 Hyperlipidemia, unspecified: Secondary | ICD-10-CM | POA: Diagnosis not present

## 2018-10-20 DIAGNOSIS — D509 Iron deficiency anemia, unspecified: Secondary | ICD-10-CM | POA: Diagnosis not present

## 2018-10-20 DIAGNOSIS — E785 Hyperlipidemia, unspecified: Secondary | ICD-10-CM | POA: Diagnosis not present

## 2018-10-20 DIAGNOSIS — N2581 Secondary hyperparathyroidism of renal origin: Secondary | ICD-10-CM | POA: Diagnosis not present

## 2018-10-20 DIAGNOSIS — D631 Anemia in chronic kidney disease: Secondary | ICD-10-CM | POA: Diagnosis not present

## 2018-10-20 DIAGNOSIS — N186 End stage renal disease: Secondary | ICD-10-CM | POA: Diagnosis not present

## 2018-10-21 DIAGNOSIS — N2581 Secondary hyperparathyroidism of renal origin: Secondary | ICD-10-CM | POA: Diagnosis not present

## 2018-10-21 DIAGNOSIS — D631 Anemia in chronic kidney disease: Secondary | ICD-10-CM | POA: Diagnosis not present

## 2018-10-21 DIAGNOSIS — N186 End stage renal disease: Secondary | ICD-10-CM | POA: Diagnosis not present

## 2018-10-21 DIAGNOSIS — E785 Hyperlipidemia, unspecified: Secondary | ICD-10-CM | POA: Diagnosis not present

## 2018-10-21 DIAGNOSIS — D509 Iron deficiency anemia, unspecified: Secondary | ICD-10-CM | POA: Diagnosis not present

## 2018-10-22 DIAGNOSIS — D509 Iron deficiency anemia, unspecified: Secondary | ICD-10-CM | POA: Diagnosis not present

## 2018-10-22 DIAGNOSIS — E785 Hyperlipidemia, unspecified: Secondary | ICD-10-CM | POA: Diagnosis not present

## 2018-10-22 DIAGNOSIS — N186 End stage renal disease: Secondary | ICD-10-CM | POA: Diagnosis not present

## 2018-10-22 DIAGNOSIS — N2581 Secondary hyperparathyroidism of renal origin: Secondary | ICD-10-CM | POA: Diagnosis not present

## 2018-10-22 DIAGNOSIS — D631 Anemia in chronic kidney disease: Secondary | ICD-10-CM | POA: Diagnosis not present

## 2018-10-23 DIAGNOSIS — E785 Hyperlipidemia, unspecified: Secondary | ICD-10-CM | POA: Diagnosis not present

## 2018-10-23 DIAGNOSIS — D631 Anemia in chronic kidney disease: Secondary | ICD-10-CM | POA: Diagnosis not present

## 2018-10-23 DIAGNOSIS — Z79899 Other long term (current) drug therapy: Secondary | ICD-10-CM | POA: Diagnosis not present

## 2018-10-23 DIAGNOSIS — N186 End stage renal disease: Secondary | ICD-10-CM | POA: Diagnosis not present

## 2018-10-23 DIAGNOSIS — E119 Type 2 diabetes mellitus without complications: Secondary | ICD-10-CM | POA: Diagnosis not present

## 2018-10-23 DIAGNOSIS — N2581 Secondary hyperparathyroidism of renal origin: Secondary | ICD-10-CM | POA: Diagnosis not present

## 2018-10-23 DIAGNOSIS — D509 Iron deficiency anemia, unspecified: Secondary | ICD-10-CM | POA: Diagnosis not present

## 2018-10-24 DIAGNOSIS — D509 Iron deficiency anemia, unspecified: Secondary | ICD-10-CM | POA: Diagnosis not present

## 2018-10-24 DIAGNOSIS — E785 Hyperlipidemia, unspecified: Secondary | ICD-10-CM | POA: Diagnosis not present

## 2018-10-24 DIAGNOSIS — D631 Anemia in chronic kidney disease: Secondary | ICD-10-CM | POA: Diagnosis not present

## 2018-10-24 DIAGNOSIS — N2581 Secondary hyperparathyroidism of renal origin: Secondary | ICD-10-CM | POA: Diagnosis not present

## 2018-10-24 DIAGNOSIS — N186 End stage renal disease: Secondary | ICD-10-CM | POA: Diagnosis not present

## 2018-10-25 DIAGNOSIS — N186 End stage renal disease: Secondary | ICD-10-CM | POA: Diagnosis not present

## 2018-10-25 DIAGNOSIS — D631 Anemia in chronic kidney disease: Secondary | ICD-10-CM | POA: Diagnosis not present

## 2018-10-25 DIAGNOSIS — N2581 Secondary hyperparathyroidism of renal origin: Secondary | ICD-10-CM | POA: Diagnosis not present

## 2018-10-26 DIAGNOSIS — N2581 Secondary hyperparathyroidism of renal origin: Secondary | ICD-10-CM | POA: Diagnosis not present

## 2018-10-26 DIAGNOSIS — N186 End stage renal disease: Secondary | ICD-10-CM | POA: Diagnosis not present

## 2018-10-26 DIAGNOSIS — D631 Anemia in chronic kidney disease: Secondary | ICD-10-CM | POA: Diagnosis not present

## 2018-10-27 DIAGNOSIS — N2581 Secondary hyperparathyroidism of renal origin: Secondary | ICD-10-CM | POA: Diagnosis not present

## 2018-10-27 DIAGNOSIS — D631 Anemia in chronic kidney disease: Secondary | ICD-10-CM | POA: Diagnosis not present

## 2018-10-27 DIAGNOSIS — N186 End stage renal disease: Secondary | ICD-10-CM | POA: Diagnosis not present

## 2018-10-28 DIAGNOSIS — N186 End stage renal disease: Secondary | ICD-10-CM | POA: Diagnosis not present

## 2018-10-28 DIAGNOSIS — N2581 Secondary hyperparathyroidism of renal origin: Secondary | ICD-10-CM | POA: Diagnosis not present

## 2018-10-28 DIAGNOSIS — D631 Anemia in chronic kidney disease: Secondary | ICD-10-CM | POA: Diagnosis not present

## 2018-10-29 DIAGNOSIS — N186 End stage renal disease: Secondary | ICD-10-CM | POA: Diagnosis not present

## 2018-10-29 DIAGNOSIS — N2581 Secondary hyperparathyroidism of renal origin: Secondary | ICD-10-CM | POA: Diagnosis not present

## 2018-10-29 DIAGNOSIS — D631 Anemia in chronic kidney disease: Secondary | ICD-10-CM | POA: Diagnosis not present

## 2018-10-30 DIAGNOSIS — N2581 Secondary hyperparathyroidism of renal origin: Secondary | ICD-10-CM | POA: Diagnosis not present

## 2018-10-30 DIAGNOSIS — N186 End stage renal disease: Secondary | ICD-10-CM | POA: Diagnosis not present

## 2018-10-30 DIAGNOSIS — D631 Anemia in chronic kidney disease: Secondary | ICD-10-CM | POA: Diagnosis not present

## 2018-10-31 DIAGNOSIS — D631 Anemia in chronic kidney disease: Secondary | ICD-10-CM | POA: Diagnosis not present

## 2018-10-31 DIAGNOSIS — N186 End stage renal disease: Secondary | ICD-10-CM | POA: Diagnosis not present

## 2018-10-31 DIAGNOSIS — N2581 Secondary hyperparathyroidism of renal origin: Secondary | ICD-10-CM | POA: Diagnosis not present

## 2018-11-01 DIAGNOSIS — N186 End stage renal disease: Secondary | ICD-10-CM | POA: Diagnosis not present

## 2018-11-01 DIAGNOSIS — D631 Anemia in chronic kidney disease: Secondary | ICD-10-CM | POA: Diagnosis not present

## 2018-11-01 DIAGNOSIS — N2581 Secondary hyperparathyroidism of renal origin: Secondary | ICD-10-CM | POA: Diagnosis not present

## 2018-11-02 DIAGNOSIS — N2581 Secondary hyperparathyroidism of renal origin: Secondary | ICD-10-CM | POA: Diagnosis not present

## 2018-11-02 DIAGNOSIS — D631 Anemia in chronic kidney disease: Secondary | ICD-10-CM | POA: Diagnosis not present

## 2018-11-02 DIAGNOSIS — N186 End stage renal disease: Secondary | ICD-10-CM | POA: Diagnosis not present

## 2018-11-03 DIAGNOSIS — D631 Anemia in chronic kidney disease: Secondary | ICD-10-CM | POA: Diagnosis not present

## 2018-11-03 DIAGNOSIS — N186 End stage renal disease: Secondary | ICD-10-CM | POA: Diagnosis not present

## 2018-11-03 DIAGNOSIS — N2581 Secondary hyperparathyroidism of renal origin: Secondary | ICD-10-CM | POA: Diagnosis not present

## 2018-11-04 DIAGNOSIS — D631 Anemia in chronic kidney disease: Secondary | ICD-10-CM | POA: Diagnosis not present

## 2018-11-04 DIAGNOSIS — N2581 Secondary hyperparathyroidism of renal origin: Secondary | ICD-10-CM | POA: Diagnosis not present

## 2018-11-04 DIAGNOSIS — N186 End stage renal disease: Secondary | ICD-10-CM | POA: Diagnosis not present

## 2018-11-05 DIAGNOSIS — D631 Anemia in chronic kidney disease: Secondary | ICD-10-CM | POA: Diagnosis not present

## 2018-11-05 DIAGNOSIS — N186 End stage renal disease: Secondary | ICD-10-CM | POA: Diagnosis not present

## 2018-11-05 DIAGNOSIS — N2581 Secondary hyperparathyroidism of renal origin: Secondary | ICD-10-CM | POA: Diagnosis not present

## 2018-11-06 DIAGNOSIS — E119 Type 2 diabetes mellitus without complications: Secondary | ICD-10-CM | POA: Diagnosis not present

## 2018-11-06 DIAGNOSIS — Z4932 Encounter for adequacy testing for peritoneal dialysis: Secondary | ICD-10-CM | POA: Diagnosis not present

## 2018-11-06 DIAGNOSIS — D631 Anemia in chronic kidney disease: Secondary | ICD-10-CM | POA: Diagnosis not present

## 2018-11-06 DIAGNOSIS — N2581 Secondary hyperparathyroidism of renal origin: Secondary | ICD-10-CM | POA: Diagnosis not present

## 2018-11-06 DIAGNOSIS — N186 End stage renal disease: Secondary | ICD-10-CM | POA: Diagnosis not present

## 2018-11-07 DIAGNOSIS — D631 Anemia in chronic kidney disease: Secondary | ICD-10-CM | POA: Diagnosis not present

## 2018-11-07 DIAGNOSIS — Z418 Encounter for other procedures for purposes other than remedying health state: Secondary | ICD-10-CM | POA: Diagnosis not present

## 2018-11-07 DIAGNOSIS — N186 End stage renal disease: Secondary | ICD-10-CM | POA: Diagnosis not present

## 2018-11-07 DIAGNOSIS — N2581 Secondary hyperparathyroidism of renal origin: Secondary | ICD-10-CM | POA: Diagnosis not present

## 2018-11-08 DIAGNOSIS — N2581 Secondary hyperparathyroidism of renal origin: Secondary | ICD-10-CM | POA: Diagnosis not present

## 2018-11-08 DIAGNOSIS — N186 End stage renal disease: Secondary | ICD-10-CM | POA: Diagnosis not present

## 2018-11-08 DIAGNOSIS — D631 Anemia in chronic kidney disease: Secondary | ICD-10-CM | POA: Diagnosis not present

## 2018-11-09 DIAGNOSIS — N186 End stage renal disease: Secondary | ICD-10-CM | POA: Diagnosis not present

## 2018-11-09 DIAGNOSIS — D631 Anemia in chronic kidney disease: Secondary | ICD-10-CM | POA: Diagnosis not present

## 2018-11-09 DIAGNOSIS — N2581 Secondary hyperparathyroidism of renal origin: Secondary | ICD-10-CM | POA: Diagnosis not present

## 2018-11-10 DIAGNOSIS — N2581 Secondary hyperparathyroidism of renal origin: Secondary | ICD-10-CM | POA: Diagnosis not present

## 2018-11-10 DIAGNOSIS — D631 Anemia in chronic kidney disease: Secondary | ICD-10-CM | POA: Diagnosis not present

## 2018-11-10 DIAGNOSIS — N186 End stage renal disease: Secondary | ICD-10-CM | POA: Diagnosis not present

## 2018-11-11 DIAGNOSIS — N186 End stage renal disease: Secondary | ICD-10-CM | POA: Diagnosis not present

## 2018-11-11 DIAGNOSIS — D631 Anemia in chronic kidney disease: Secondary | ICD-10-CM | POA: Diagnosis not present

## 2018-11-11 DIAGNOSIS — N2581 Secondary hyperparathyroidism of renal origin: Secondary | ICD-10-CM | POA: Diagnosis not present

## 2018-11-12 DIAGNOSIS — D631 Anemia in chronic kidney disease: Secondary | ICD-10-CM | POA: Diagnosis not present

## 2018-11-12 DIAGNOSIS — N2581 Secondary hyperparathyroidism of renal origin: Secondary | ICD-10-CM | POA: Diagnosis not present

## 2018-11-12 DIAGNOSIS — N186 End stage renal disease: Secondary | ICD-10-CM | POA: Diagnosis not present

## 2018-11-13 DIAGNOSIS — N2581 Secondary hyperparathyroidism of renal origin: Secondary | ICD-10-CM | POA: Diagnosis not present

## 2018-11-13 DIAGNOSIS — D631 Anemia in chronic kidney disease: Secondary | ICD-10-CM | POA: Diagnosis not present

## 2018-11-13 DIAGNOSIS — N186 End stage renal disease: Secondary | ICD-10-CM | POA: Diagnosis not present

## 2018-11-14 DIAGNOSIS — N186 End stage renal disease: Secondary | ICD-10-CM | POA: Diagnosis not present

## 2018-11-14 DIAGNOSIS — D631 Anemia in chronic kidney disease: Secondary | ICD-10-CM | POA: Diagnosis not present

## 2018-11-14 DIAGNOSIS — N2581 Secondary hyperparathyroidism of renal origin: Secondary | ICD-10-CM | POA: Diagnosis not present

## 2018-11-15 DIAGNOSIS — N2581 Secondary hyperparathyroidism of renal origin: Secondary | ICD-10-CM | POA: Diagnosis not present

## 2018-11-15 DIAGNOSIS — D631 Anemia in chronic kidney disease: Secondary | ICD-10-CM | POA: Diagnosis not present

## 2018-11-15 DIAGNOSIS — N186 End stage renal disease: Secondary | ICD-10-CM | POA: Diagnosis not present

## 2018-11-16 DIAGNOSIS — D631 Anemia in chronic kidney disease: Secondary | ICD-10-CM | POA: Diagnosis not present

## 2018-11-16 DIAGNOSIS — N2581 Secondary hyperparathyroidism of renal origin: Secondary | ICD-10-CM | POA: Diagnosis not present

## 2018-11-16 DIAGNOSIS — N186 End stage renal disease: Secondary | ICD-10-CM | POA: Diagnosis not present

## 2018-11-17 DIAGNOSIS — N2581 Secondary hyperparathyroidism of renal origin: Secondary | ICD-10-CM | POA: Diagnosis not present

## 2018-11-17 DIAGNOSIS — N186 End stage renal disease: Secondary | ICD-10-CM | POA: Diagnosis not present

## 2018-11-17 DIAGNOSIS — D631 Anemia in chronic kidney disease: Secondary | ICD-10-CM | POA: Diagnosis not present

## 2018-11-18 DIAGNOSIS — N186 End stage renal disease: Secondary | ICD-10-CM | POA: Diagnosis not present

## 2018-11-18 DIAGNOSIS — D631 Anemia in chronic kidney disease: Secondary | ICD-10-CM | POA: Diagnosis not present

## 2018-11-18 DIAGNOSIS — N2581 Secondary hyperparathyroidism of renal origin: Secondary | ICD-10-CM | POA: Diagnosis not present

## 2018-11-19 DIAGNOSIS — D631 Anemia in chronic kidney disease: Secondary | ICD-10-CM | POA: Diagnosis not present

## 2018-11-19 DIAGNOSIS — N186 End stage renal disease: Secondary | ICD-10-CM | POA: Diagnosis not present

## 2018-11-19 DIAGNOSIS — N2581 Secondary hyperparathyroidism of renal origin: Secondary | ICD-10-CM | POA: Diagnosis not present

## 2018-11-20 DIAGNOSIS — N2581 Secondary hyperparathyroidism of renal origin: Secondary | ICD-10-CM | POA: Diagnosis not present

## 2018-11-20 DIAGNOSIS — N186 End stage renal disease: Secondary | ICD-10-CM | POA: Diagnosis not present

## 2018-11-20 DIAGNOSIS — D631 Anemia in chronic kidney disease: Secondary | ICD-10-CM | POA: Diagnosis not present

## 2018-11-21 DIAGNOSIS — N186 End stage renal disease: Secondary | ICD-10-CM | POA: Diagnosis not present

## 2018-11-21 DIAGNOSIS — N2581 Secondary hyperparathyroidism of renal origin: Secondary | ICD-10-CM | POA: Diagnosis not present

## 2018-11-21 DIAGNOSIS — D631 Anemia in chronic kidney disease: Secondary | ICD-10-CM | POA: Diagnosis not present

## 2018-11-22 DIAGNOSIS — D631 Anemia in chronic kidney disease: Secondary | ICD-10-CM | POA: Diagnosis not present

## 2018-11-22 DIAGNOSIS — N186 End stage renal disease: Secondary | ICD-10-CM | POA: Diagnosis not present

## 2018-11-22 DIAGNOSIS — N2581 Secondary hyperparathyroidism of renal origin: Secondary | ICD-10-CM | POA: Diagnosis not present

## 2018-11-23 DIAGNOSIS — N186 End stage renal disease: Secondary | ICD-10-CM | POA: Diagnosis not present

## 2018-11-23 DIAGNOSIS — N2581 Secondary hyperparathyroidism of renal origin: Secondary | ICD-10-CM | POA: Diagnosis not present

## 2018-11-23 DIAGNOSIS — D631 Anemia in chronic kidney disease: Secondary | ICD-10-CM | POA: Diagnosis not present

## 2018-11-24 DIAGNOSIS — N2581 Secondary hyperparathyroidism of renal origin: Secondary | ICD-10-CM | POA: Diagnosis not present

## 2018-11-24 DIAGNOSIS — D631 Anemia in chronic kidney disease: Secondary | ICD-10-CM | POA: Diagnosis not present

## 2018-11-24 DIAGNOSIS — Z992 Dependence on renal dialysis: Secondary | ICD-10-CM | POA: Diagnosis not present

## 2018-11-24 DIAGNOSIS — N186 End stage renal disease: Secondary | ICD-10-CM | POA: Diagnosis not present

## 2018-11-25 DIAGNOSIS — N186 End stage renal disease: Secondary | ICD-10-CM | POA: Diagnosis not present

## 2018-11-25 DIAGNOSIS — D631 Anemia in chronic kidney disease: Secondary | ICD-10-CM | POA: Diagnosis not present

## 2018-11-25 DIAGNOSIS — N2581 Secondary hyperparathyroidism of renal origin: Secondary | ICD-10-CM | POA: Diagnosis not present

## 2018-11-26 DIAGNOSIS — N2581 Secondary hyperparathyroidism of renal origin: Secondary | ICD-10-CM | POA: Diagnosis not present

## 2018-11-26 DIAGNOSIS — D631 Anemia in chronic kidney disease: Secondary | ICD-10-CM | POA: Diagnosis not present

## 2018-11-26 DIAGNOSIS — N186 End stage renal disease: Secondary | ICD-10-CM | POA: Diagnosis not present

## 2018-11-27 DIAGNOSIS — D631 Anemia in chronic kidney disease: Secondary | ICD-10-CM | POA: Diagnosis not present

## 2018-11-27 DIAGNOSIS — N2581 Secondary hyperparathyroidism of renal origin: Secondary | ICD-10-CM | POA: Diagnosis not present

## 2018-11-27 DIAGNOSIS — N186 End stage renal disease: Secondary | ICD-10-CM | POA: Diagnosis not present

## 2018-11-28 DIAGNOSIS — N186 End stage renal disease: Secondary | ICD-10-CM | POA: Diagnosis not present

## 2018-11-28 DIAGNOSIS — N2581 Secondary hyperparathyroidism of renal origin: Secondary | ICD-10-CM | POA: Diagnosis not present

## 2018-11-28 DIAGNOSIS — D631 Anemia in chronic kidney disease: Secondary | ICD-10-CM | POA: Diagnosis not present

## 2018-11-29 DIAGNOSIS — N186 End stage renal disease: Secondary | ICD-10-CM | POA: Diagnosis not present

## 2018-11-29 DIAGNOSIS — D631 Anemia in chronic kidney disease: Secondary | ICD-10-CM | POA: Diagnosis not present

## 2018-11-29 DIAGNOSIS — N2581 Secondary hyperparathyroidism of renal origin: Secondary | ICD-10-CM | POA: Diagnosis not present

## 2018-11-30 DIAGNOSIS — N186 End stage renal disease: Secondary | ICD-10-CM | POA: Diagnosis not present

## 2018-11-30 DIAGNOSIS — D631 Anemia in chronic kidney disease: Secondary | ICD-10-CM | POA: Diagnosis not present

## 2018-11-30 DIAGNOSIS — N2581 Secondary hyperparathyroidism of renal origin: Secondary | ICD-10-CM | POA: Diagnosis not present

## 2018-12-01 DIAGNOSIS — N2581 Secondary hyperparathyroidism of renal origin: Secondary | ICD-10-CM | POA: Diagnosis not present

## 2018-12-01 DIAGNOSIS — D631 Anemia in chronic kidney disease: Secondary | ICD-10-CM | POA: Diagnosis not present

## 2018-12-01 DIAGNOSIS — N186 End stage renal disease: Secondary | ICD-10-CM | POA: Diagnosis not present

## 2018-12-02 DIAGNOSIS — N2581 Secondary hyperparathyroidism of renal origin: Secondary | ICD-10-CM | POA: Diagnosis not present

## 2018-12-02 DIAGNOSIS — D631 Anemia in chronic kidney disease: Secondary | ICD-10-CM | POA: Diagnosis not present

## 2018-12-02 DIAGNOSIS — N186 End stage renal disease: Secondary | ICD-10-CM | POA: Diagnosis not present

## 2018-12-03 DIAGNOSIS — N2581 Secondary hyperparathyroidism of renal origin: Secondary | ICD-10-CM | POA: Diagnosis not present

## 2018-12-03 DIAGNOSIS — D631 Anemia in chronic kidney disease: Secondary | ICD-10-CM | POA: Diagnosis not present

## 2018-12-03 DIAGNOSIS — N186 End stage renal disease: Secondary | ICD-10-CM | POA: Diagnosis not present

## 2018-12-04 DIAGNOSIS — N2581 Secondary hyperparathyroidism of renal origin: Secondary | ICD-10-CM | POA: Diagnosis not present

## 2018-12-04 DIAGNOSIS — N186 End stage renal disease: Secondary | ICD-10-CM | POA: Diagnosis not present

## 2018-12-04 DIAGNOSIS — D631 Anemia in chronic kidney disease: Secondary | ICD-10-CM | POA: Diagnosis not present

## 2018-12-05 DIAGNOSIS — D631 Anemia in chronic kidney disease: Secondary | ICD-10-CM | POA: Diagnosis not present

## 2018-12-05 DIAGNOSIS — N2581 Secondary hyperparathyroidism of renal origin: Secondary | ICD-10-CM | POA: Diagnosis not present

## 2018-12-05 DIAGNOSIS — N186 End stage renal disease: Secondary | ICD-10-CM | POA: Diagnosis not present

## 2018-12-06 DIAGNOSIS — N2581 Secondary hyperparathyroidism of renal origin: Secondary | ICD-10-CM | POA: Diagnosis not present

## 2018-12-06 DIAGNOSIS — D631 Anemia in chronic kidney disease: Secondary | ICD-10-CM | POA: Diagnosis not present

## 2018-12-06 DIAGNOSIS — G4733 Obstructive sleep apnea (adult) (pediatric): Secondary | ICD-10-CM | POA: Diagnosis not present

## 2018-12-06 DIAGNOSIS — N186 End stage renal disease: Secondary | ICD-10-CM | POA: Diagnosis not present

## 2018-12-07 DIAGNOSIS — N186 End stage renal disease: Secondary | ICD-10-CM | POA: Diagnosis not present

## 2018-12-07 DIAGNOSIS — N2581 Secondary hyperparathyroidism of renal origin: Secondary | ICD-10-CM | POA: Diagnosis not present

## 2018-12-07 DIAGNOSIS — D631 Anemia in chronic kidney disease: Secondary | ICD-10-CM | POA: Diagnosis not present

## 2018-12-08 DIAGNOSIS — N2581 Secondary hyperparathyroidism of renal origin: Secondary | ICD-10-CM | POA: Diagnosis not present

## 2018-12-08 DIAGNOSIS — D631 Anemia in chronic kidney disease: Secondary | ICD-10-CM | POA: Diagnosis not present

## 2018-12-08 DIAGNOSIS — N186 End stage renal disease: Secondary | ICD-10-CM | POA: Diagnosis not present

## 2018-12-09 DIAGNOSIS — D631 Anemia in chronic kidney disease: Secondary | ICD-10-CM | POA: Diagnosis not present

## 2018-12-09 DIAGNOSIS — N186 End stage renal disease: Secondary | ICD-10-CM | POA: Diagnosis not present

## 2018-12-09 DIAGNOSIS — N2581 Secondary hyperparathyroidism of renal origin: Secondary | ICD-10-CM | POA: Diagnosis not present

## 2018-12-10 DIAGNOSIS — N2581 Secondary hyperparathyroidism of renal origin: Secondary | ICD-10-CM | POA: Diagnosis not present

## 2018-12-10 DIAGNOSIS — D631 Anemia in chronic kidney disease: Secondary | ICD-10-CM | POA: Diagnosis not present

## 2018-12-10 DIAGNOSIS — N186 End stage renal disease: Secondary | ICD-10-CM | POA: Diagnosis not present

## 2018-12-11 DIAGNOSIS — N2581 Secondary hyperparathyroidism of renal origin: Secondary | ICD-10-CM | POA: Diagnosis not present

## 2018-12-11 DIAGNOSIS — D631 Anemia in chronic kidney disease: Secondary | ICD-10-CM | POA: Diagnosis not present

## 2018-12-11 DIAGNOSIS — N186 End stage renal disease: Secondary | ICD-10-CM | POA: Diagnosis not present

## 2018-12-12 DIAGNOSIS — N186 End stage renal disease: Secondary | ICD-10-CM | POA: Diagnosis not present

## 2018-12-12 DIAGNOSIS — D631 Anemia in chronic kidney disease: Secondary | ICD-10-CM | POA: Diagnosis not present

## 2018-12-12 DIAGNOSIS — N2581 Secondary hyperparathyroidism of renal origin: Secondary | ICD-10-CM | POA: Diagnosis not present

## 2018-12-13 DIAGNOSIS — N186 End stage renal disease: Secondary | ICD-10-CM | POA: Diagnosis not present

## 2018-12-13 DIAGNOSIS — N2581 Secondary hyperparathyroidism of renal origin: Secondary | ICD-10-CM | POA: Diagnosis not present

## 2018-12-13 DIAGNOSIS — D631 Anemia in chronic kidney disease: Secondary | ICD-10-CM | POA: Diagnosis not present

## 2018-12-14 DIAGNOSIS — N186 End stage renal disease: Secondary | ICD-10-CM | POA: Diagnosis not present

## 2018-12-14 DIAGNOSIS — D631 Anemia in chronic kidney disease: Secondary | ICD-10-CM | POA: Diagnosis not present

## 2018-12-14 DIAGNOSIS — N2581 Secondary hyperparathyroidism of renal origin: Secondary | ICD-10-CM | POA: Diagnosis not present

## 2018-12-15 DIAGNOSIS — N2581 Secondary hyperparathyroidism of renal origin: Secondary | ICD-10-CM | POA: Diagnosis not present

## 2018-12-15 DIAGNOSIS — D631 Anemia in chronic kidney disease: Secondary | ICD-10-CM | POA: Diagnosis not present

## 2018-12-15 DIAGNOSIS — N186 End stage renal disease: Secondary | ICD-10-CM | POA: Diagnosis not present

## 2018-12-16 DIAGNOSIS — N2581 Secondary hyperparathyroidism of renal origin: Secondary | ICD-10-CM | POA: Diagnosis not present

## 2018-12-16 DIAGNOSIS — D631 Anemia in chronic kidney disease: Secondary | ICD-10-CM | POA: Diagnosis not present

## 2018-12-16 DIAGNOSIS — N186 End stage renal disease: Secondary | ICD-10-CM | POA: Diagnosis not present

## 2018-12-17 DIAGNOSIS — N186 End stage renal disease: Secondary | ICD-10-CM | POA: Diagnosis not present

## 2018-12-17 DIAGNOSIS — D631 Anemia in chronic kidney disease: Secondary | ICD-10-CM | POA: Diagnosis not present

## 2018-12-17 DIAGNOSIS — N2581 Secondary hyperparathyroidism of renal origin: Secondary | ICD-10-CM | POA: Diagnosis not present

## 2018-12-18 DIAGNOSIS — N2581 Secondary hyperparathyroidism of renal origin: Secondary | ICD-10-CM | POA: Diagnosis not present

## 2018-12-18 DIAGNOSIS — N186 End stage renal disease: Secondary | ICD-10-CM | POA: Diagnosis not present

## 2018-12-18 DIAGNOSIS — D631 Anemia in chronic kidney disease: Secondary | ICD-10-CM | POA: Diagnosis not present

## 2018-12-19 DIAGNOSIS — D631 Anemia in chronic kidney disease: Secondary | ICD-10-CM | POA: Diagnosis not present

## 2018-12-19 DIAGNOSIS — N186 End stage renal disease: Secondary | ICD-10-CM | POA: Diagnosis not present

## 2018-12-19 DIAGNOSIS — N2581 Secondary hyperparathyroidism of renal origin: Secondary | ICD-10-CM | POA: Diagnosis not present

## 2018-12-20 DIAGNOSIS — D631 Anemia in chronic kidney disease: Secondary | ICD-10-CM | POA: Diagnosis not present

## 2018-12-20 DIAGNOSIS — N2581 Secondary hyperparathyroidism of renal origin: Secondary | ICD-10-CM | POA: Diagnosis not present

## 2018-12-20 DIAGNOSIS — N186 End stage renal disease: Secondary | ICD-10-CM | POA: Diagnosis not present

## 2018-12-21 DIAGNOSIS — N2581 Secondary hyperparathyroidism of renal origin: Secondary | ICD-10-CM | POA: Diagnosis not present

## 2018-12-21 DIAGNOSIS — N186 End stage renal disease: Secondary | ICD-10-CM | POA: Diagnosis not present

## 2018-12-21 DIAGNOSIS — D631 Anemia in chronic kidney disease: Secondary | ICD-10-CM | POA: Diagnosis not present

## 2018-12-22 DIAGNOSIS — N186 End stage renal disease: Secondary | ICD-10-CM | POA: Diagnosis not present

## 2018-12-22 DIAGNOSIS — D631 Anemia in chronic kidney disease: Secondary | ICD-10-CM | POA: Diagnosis not present

## 2018-12-22 DIAGNOSIS — N2581 Secondary hyperparathyroidism of renal origin: Secondary | ICD-10-CM | POA: Diagnosis not present

## 2018-12-23 DIAGNOSIS — D631 Anemia in chronic kidney disease: Secondary | ICD-10-CM | POA: Diagnosis not present

## 2018-12-23 DIAGNOSIS — E1139 Type 2 diabetes mellitus with other diabetic ophthalmic complication: Secondary | ICD-10-CM | POA: Diagnosis not present

## 2018-12-23 DIAGNOSIS — N2581 Secondary hyperparathyroidism of renal origin: Secondary | ICD-10-CM | POA: Diagnosis not present

## 2018-12-23 DIAGNOSIS — N186 End stage renal disease: Secondary | ICD-10-CM | POA: Diagnosis not present

## 2018-12-24 DIAGNOSIS — D631 Anemia in chronic kidney disease: Secondary | ICD-10-CM | POA: Diagnosis not present

## 2018-12-24 DIAGNOSIS — N2581 Secondary hyperparathyroidism of renal origin: Secondary | ICD-10-CM | POA: Diagnosis not present

## 2018-12-24 DIAGNOSIS — Z992 Dependence on renal dialysis: Secondary | ICD-10-CM | POA: Diagnosis not present

## 2018-12-24 DIAGNOSIS — N186 End stage renal disease: Secondary | ICD-10-CM | POA: Diagnosis not present

## 2018-12-25 DIAGNOSIS — N2581 Secondary hyperparathyroidism of renal origin: Secondary | ICD-10-CM | POA: Diagnosis not present

## 2018-12-25 DIAGNOSIS — D631 Anemia in chronic kidney disease: Secondary | ICD-10-CM | POA: Diagnosis not present

## 2018-12-25 DIAGNOSIS — Z23 Encounter for immunization: Secondary | ICD-10-CM | POA: Diagnosis not present

## 2018-12-25 DIAGNOSIS — E785 Hyperlipidemia, unspecified: Secondary | ICD-10-CM | POA: Diagnosis not present

## 2018-12-25 DIAGNOSIS — N186 End stage renal disease: Secondary | ICD-10-CM | POA: Diagnosis not present

## 2018-12-26 DIAGNOSIS — N186 End stage renal disease: Secondary | ICD-10-CM | POA: Diagnosis not present

## 2018-12-26 DIAGNOSIS — E785 Hyperlipidemia, unspecified: Secondary | ICD-10-CM | POA: Diagnosis not present

## 2018-12-26 DIAGNOSIS — Z23 Encounter for immunization: Secondary | ICD-10-CM | POA: Diagnosis not present

## 2018-12-26 DIAGNOSIS — D631 Anemia in chronic kidney disease: Secondary | ICD-10-CM | POA: Diagnosis not present

## 2018-12-26 DIAGNOSIS — N2581 Secondary hyperparathyroidism of renal origin: Secondary | ICD-10-CM | POA: Diagnosis not present

## 2018-12-27 DIAGNOSIS — E785 Hyperlipidemia, unspecified: Secondary | ICD-10-CM | POA: Diagnosis not present

## 2018-12-27 DIAGNOSIS — Z23 Encounter for immunization: Secondary | ICD-10-CM | POA: Diagnosis not present

## 2018-12-27 DIAGNOSIS — D631 Anemia in chronic kidney disease: Secondary | ICD-10-CM | POA: Diagnosis not present

## 2018-12-27 DIAGNOSIS — N186 End stage renal disease: Secondary | ICD-10-CM | POA: Diagnosis not present

## 2018-12-27 DIAGNOSIS — N2581 Secondary hyperparathyroidism of renal origin: Secondary | ICD-10-CM | POA: Diagnosis not present

## 2018-12-28 DIAGNOSIS — E785 Hyperlipidemia, unspecified: Secondary | ICD-10-CM | POA: Diagnosis not present

## 2018-12-28 DIAGNOSIS — N2581 Secondary hyperparathyroidism of renal origin: Secondary | ICD-10-CM | POA: Diagnosis not present

## 2018-12-28 DIAGNOSIS — D631 Anemia in chronic kidney disease: Secondary | ICD-10-CM | POA: Diagnosis not present

## 2018-12-28 DIAGNOSIS — N186 End stage renal disease: Secondary | ICD-10-CM | POA: Diagnosis not present

## 2018-12-28 DIAGNOSIS — Z23 Encounter for immunization: Secondary | ICD-10-CM | POA: Diagnosis not present

## 2018-12-29 DIAGNOSIS — Z23 Encounter for immunization: Secondary | ICD-10-CM | POA: Diagnosis not present

## 2018-12-29 DIAGNOSIS — N186 End stage renal disease: Secondary | ICD-10-CM | POA: Diagnosis not present

## 2018-12-29 DIAGNOSIS — E785 Hyperlipidemia, unspecified: Secondary | ICD-10-CM | POA: Diagnosis not present

## 2018-12-29 DIAGNOSIS — N2581 Secondary hyperparathyroidism of renal origin: Secondary | ICD-10-CM | POA: Diagnosis not present

## 2018-12-29 DIAGNOSIS — D631 Anemia in chronic kidney disease: Secondary | ICD-10-CM | POA: Diagnosis not present

## 2018-12-30 DIAGNOSIS — N186 End stage renal disease: Secondary | ICD-10-CM | POA: Diagnosis not present

## 2018-12-30 DIAGNOSIS — Z23 Encounter for immunization: Secondary | ICD-10-CM | POA: Diagnosis not present

## 2018-12-30 DIAGNOSIS — D631 Anemia in chronic kidney disease: Secondary | ICD-10-CM | POA: Diagnosis not present

## 2018-12-30 DIAGNOSIS — E785 Hyperlipidemia, unspecified: Secondary | ICD-10-CM | POA: Diagnosis not present

## 2018-12-30 DIAGNOSIS — N2581 Secondary hyperparathyroidism of renal origin: Secondary | ICD-10-CM | POA: Diagnosis not present

## 2018-12-31 DIAGNOSIS — N186 End stage renal disease: Secondary | ICD-10-CM | POA: Diagnosis not present

## 2018-12-31 DIAGNOSIS — N2581 Secondary hyperparathyroidism of renal origin: Secondary | ICD-10-CM | POA: Diagnosis not present

## 2018-12-31 DIAGNOSIS — Z23 Encounter for immunization: Secondary | ICD-10-CM | POA: Diagnosis not present

## 2018-12-31 DIAGNOSIS — E785 Hyperlipidemia, unspecified: Secondary | ICD-10-CM | POA: Diagnosis not present

## 2018-12-31 DIAGNOSIS — D631 Anemia in chronic kidney disease: Secondary | ICD-10-CM | POA: Diagnosis not present

## 2019-01-01 DIAGNOSIS — E785 Hyperlipidemia, unspecified: Secondary | ICD-10-CM | POA: Diagnosis not present

## 2019-01-01 DIAGNOSIS — E119 Type 2 diabetes mellitus without complications: Secondary | ICD-10-CM | POA: Diagnosis not present

## 2019-01-01 DIAGNOSIS — D631 Anemia in chronic kidney disease: Secondary | ICD-10-CM | POA: Diagnosis not present

## 2019-01-01 DIAGNOSIS — Z23 Encounter for immunization: Secondary | ICD-10-CM | POA: Diagnosis not present

## 2019-01-01 DIAGNOSIS — N186 End stage renal disease: Secondary | ICD-10-CM | POA: Diagnosis not present

## 2019-01-01 DIAGNOSIS — N2581 Secondary hyperparathyroidism of renal origin: Secondary | ICD-10-CM | POA: Diagnosis not present

## 2019-01-02 DIAGNOSIS — N186 End stage renal disease: Secondary | ICD-10-CM | POA: Diagnosis not present

## 2019-01-02 DIAGNOSIS — N2581 Secondary hyperparathyroidism of renal origin: Secondary | ICD-10-CM | POA: Diagnosis not present

## 2019-01-02 DIAGNOSIS — Z23 Encounter for immunization: Secondary | ICD-10-CM | POA: Diagnosis not present

## 2019-01-02 DIAGNOSIS — E785 Hyperlipidemia, unspecified: Secondary | ICD-10-CM | POA: Diagnosis not present

## 2019-01-02 DIAGNOSIS — D631 Anemia in chronic kidney disease: Secondary | ICD-10-CM | POA: Diagnosis not present

## 2019-01-03 DIAGNOSIS — N2581 Secondary hyperparathyroidism of renal origin: Secondary | ICD-10-CM | POA: Diagnosis not present

## 2019-01-03 DIAGNOSIS — N186 End stage renal disease: Secondary | ICD-10-CM | POA: Diagnosis not present

## 2019-01-03 DIAGNOSIS — D631 Anemia in chronic kidney disease: Secondary | ICD-10-CM | POA: Diagnosis not present

## 2019-01-03 DIAGNOSIS — E785 Hyperlipidemia, unspecified: Secondary | ICD-10-CM | POA: Diagnosis not present

## 2019-01-03 DIAGNOSIS — Z23 Encounter for immunization: Secondary | ICD-10-CM | POA: Diagnosis not present

## 2019-01-04 DIAGNOSIS — D631 Anemia in chronic kidney disease: Secondary | ICD-10-CM | POA: Diagnosis not present

## 2019-01-04 DIAGNOSIS — E785 Hyperlipidemia, unspecified: Secondary | ICD-10-CM | POA: Diagnosis not present

## 2019-01-04 DIAGNOSIS — N186 End stage renal disease: Secondary | ICD-10-CM | POA: Diagnosis not present

## 2019-01-04 DIAGNOSIS — Z23 Encounter for immunization: Secondary | ICD-10-CM | POA: Diagnosis not present

## 2019-01-04 DIAGNOSIS — N2581 Secondary hyperparathyroidism of renal origin: Secondary | ICD-10-CM | POA: Diagnosis not present

## 2019-01-05 DIAGNOSIS — E785 Hyperlipidemia, unspecified: Secondary | ICD-10-CM | POA: Diagnosis not present

## 2019-01-05 DIAGNOSIS — Z23 Encounter for immunization: Secondary | ICD-10-CM | POA: Diagnosis not present

## 2019-01-05 DIAGNOSIS — N186 End stage renal disease: Secondary | ICD-10-CM | POA: Diagnosis not present

## 2019-01-05 DIAGNOSIS — N2581 Secondary hyperparathyroidism of renal origin: Secondary | ICD-10-CM | POA: Diagnosis not present

## 2019-01-05 DIAGNOSIS — D631 Anemia in chronic kidney disease: Secondary | ICD-10-CM | POA: Diagnosis not present

## 2019-01-06 DIAGNOSIS — D631 Anemia in chronic kidney disease: Secondary | ICD-10-CM | POA: Diagnosis not present

## 2019-01-06 DIAGNOSIS — Z23 Encounter for immunization: Secondary | ICD-10-CM | POA: Diagnosis not present

## 2019-01-06 DIAGNOSIS — N186 End stage renal disease: Secondary | ICD-10-CM | POA: Diagnosis not present

## 2019-01-06 DIAGNOSIS — E785 Hyperlipidemia, unspecified: Secondary | ICD-10-CM | POA: Diagnosis not present

## 2019-01-06 DIAGNOSIS — N2581 Secondary hyperparathyroidism of renal origin: Secondary | ICD-10-CM | POA: Diagnosis not present

## 2019-01-07 DIAGNOSIS — N2581 Secondary hyperparathyroidism of renal origin: Secondary | ICD-10-CM | POA: Diagnosis not present

## 2019-01-07 DIAGNOSIS — E785 Hyperlipidemia, unspecified: Secondary | ICD-10-CM | POA: Diagnosis not present

## 2019-01-07 DIAGNOSIS — N186 End stage renal disease: Secondary | ICD-10-CM | POA: Diagnosis not present

## 2019-01-07 DIAGNOSIS — Z23 Encounter for immunization: Secondary | ICD-10-CM | POA: Diagnosis not present

## 2019-01-07 DIAGNOSIS — D631 Anemia in chronic kidney disease: Secondary | ICD-10-CM | POA: Diagnosis not present

## 2019-01-08 DIAGNOSIS — D631 Anemia in chronic kidney disease: Secondary | ICD-10-CM | POA: Diagnosis not present

## 2019-01-08 DIAGNOSIS — E785 Hyperlipidemia, unspecified: Secondary | ICD-10-CM | POA: Diagnosis not present

## 2019-01-08 DIAGNOSIS — N186 End stage renal disease: Secondary | ICD-10-CM | POA: Diagnosis not present

## 2019-01-08 DIAGNOSIS — N2581 Secondary hyperparathyroidism of renal origin: Secondary | ICD-10-CM | POA: Diagnosis not present

## 2019-01-08 DIAGNOSIS — Z23 Encounter for immunization: Secondary | ICD-10-CM | POA: Diagnosis not present

## 2019-01-09 DIAGNOSIS — D631 Anemia in chronic kidney disease: Secondary | ICD-10-CM | POA: Diagnosis not present

## 2019-01-09 DIAGNOSIS — E785 Hyperlipidemia, unspecified: Secondary | ICD-10-CM | POA: Diagnosis not present

## 2019-01-09 DIAGNOSIS — Z23 Encounter for immunization: Secondary | ICD-10-CM | POA: Diagnosis not present

## 2019-01-09 DIAGNOSIS — N2581 Secondary hyperparathyroidism of renal origin: Secondary | ICD-10-CM | POA: Diagnosis not present

## 2019-01-09 DIAGNOSIS — N186 End stage renal disease: Secondary | ICD-10-CM | POA: Diagnosis not present

## 2019-01-10 DIAGNOSIS — E785 Hyperlipidemia, unspecified: Secondary | ICD-10-CM | POA: Diagnosis not present

## 2019-01-10 DIAGNOSIS — N2581 Secondary hyperparathyroidism of renal origin: Secondary | ICD-10-CM | POA: Diagnosis not present

## 2019-01-10 DIAGNOSIS — Z23 Encounter for immunization: Secondary | ICD-10-CM | POA: Diagnosis not present

## 2019-01-10 DIAGNOSIS — D631 Anemia in chronic kidney disease: Secondary | ICD-10-CM | POA: Diagnosis not present

## 2019-01-10 DIAGNOSIS — N186 End stage renal disease: Secondary | ICD-10-CM | POA: Diagnosis not present

## 2019-01-11 DIAGNOSIS — N186 End stage renal disease: Secondary | ICD-10-CM | POA: Diagnosis not present

## 2019-01-11 DIAGNOSIS — N2581 Secondary hyperparathyroidism of renal origin: Secondary | ICD-10-CM | POA: Diagnosis not present

## 2019-01-11 DIAGNOSIS — Z23 Encounter for immunization: Secondary | ICD-10-CM | POA: Diagnosis not present

## 2019-01-11 DIAGNOSIS — E785 Hyperlipidemia, unspecified: Secondary | ICD-10-CM | POA: Diagnosis not present

## 2019-01-11 DIAGNOSIS — D631 Anemia in chronic kidney disease: Secondary | ICD-10-CM | POA: Diagnosis not present

## 2019-01-12 DIAGNOSIS — N186 End stage renal disease: Secondary | ICD-10-CM | POA: Diagnosis not present

## 2019-01-12 DIAGNOSIS — N2581 Secondary hyperparathyroidism of renal origin: Secondary | ICD-10-CM | POA: Diagnosis not present

## 2019-01-12 DIAGNOSIS — E785 Hyperlipidemia, unspecified: Secondary | ICD-10-CM | POA: Diagnosis not present

## 2019-01-12 DIAGNOSIS — Z23 Encounter for immunization: Secondary | ICD-10-CM | POA: Diagnosis not present

## 2019-01-12 DIAGNOSIS — D631 Anemia in chronic kidney disease: Secondary | ICD-10-CM | POA: Diagnosis not present

## 2019-01-13 DIAGNOSIS — N2581 Secondary hyperparathyroidism of renal origin: Secondary | ICD-10-CM | POA: Diagnosis not present

## 2019-01-13 DIAGNOSIS — Z23 Encounter for immunization: Secondary | ICD-10-CM | POA: Diagnosis not present

## 2019-01-13 DIAGNOSIS — D631 Anemia in chronic kidney disease: Secondary | ICD-10-CM | POA: Diagnosis not present

## 2019-01-13 DIAGNOSIS — N186 End stage renal disease: Secondary | ICD-10-CM | POA: Diagnosis not present

## 2019-01-13 DIAGNOSIS — E785 Hyperlipidemia, unspecified: Secondary | ICD-10-CM | POA: Diagnosis not present

## 2019-01-14 DIAGNOSIS — N186 End stage renal disease: Secondary | ICD-10-CM | POA: Diagnosis not present

## 2019-01-14 DIAGNOSIS — D631 Anemia in chronic kidney disease: Secondary | ICD-10-CM | POA: Diagnosis not present

## 2019-01-14 DIAGNOSIS — Z23 Encounter for immunization: Secondary | ICD-10-CM | POA: Diagnosis not present

## 2019-01-14 DIAGNOSIS — N2581 Secondary hyperparathyroidism of renal origin: Secondary | ICD-10-CM | POA: Diagnosis not present

## 2019-01-14 DIAGNOSIS — E785 Hyperlipidemia, unspecified: Secondary | ICD-10-CM | POA: Diagnosis not present

## 2019-01-15 DIAGNOSIS — N2581 Secondary hyperparathyroidism of renal origin: Secondary | ICD-10-CM | POA: Diagnosis not present

## 2019-01-15 DIAGNOSIS — E785 Hyperlipidemia, unspecified: Secondary | ICD-10-CM | POA: Diagnosis not present

## 2019-01-15 DIAGNOSIS — N186 End stage renal disease: Secondary | ICD-10-CM | POA: Diagnosis not present

## 2019-01-15 DIAGNOSIS — D631 Anemia in chronic kidney disease: Secondary | ICD-10-CM | POA: Diagnosis not present

## 2019-01-15 DIAGNOSIS — Z23 Encounter for immunization: Secondary | ICD-10-CM | POA: Diagnosis not present

## 2019-01-16 DIAGNOSIS — Z23 Encounter for immunization: Secondary | ICD-10-CM | POA: Diagnosis not present

## 2019-01-16 DIAGNOSIS — N186 End stage renal disease: Secondary | ICD-10-CM | POA: Diagnosis not present

## 2019-01-16 DIAGNOSIS — E785 Hyperlipidemia, unspecified: Secondary | ICD-10-CM | POA: Diagnosis not present

## 2019-01-16 DIAGNOSIS — D631 Anemia in chronic kidney disease: Secondary | ICD-10-CM | POA: Diagnosis not present

## 2019-01-16 DIAGNOSIS — N2581 Secondary hyperparathyroidism of renal origin: Secondary | ICD-10-CM | POA: Diagnosis not present

## 2019-01-17 DIAGNOSIS — Z23 Encounter for immunization: Secondary | ICD-10-CM | POA: Diagnosis not present

## 2019-01-17 DIAGNOSIS — N2581 Secondary hyperparathyroidism of renal origin: Secondary | ICD-10-CM | POA: Diagnosis not present

## 2019-01-17 DIAGNOSIS — E785 Hyperlipidemia, unspecified: Secondary | ICD-10-CM | POA: Diagnosis not present

## 2019-01-17 DIAGNOSIS — N186 End stage renal disease: Secondary | ICD-10-CM | POA: Diagnosis not present

## 2019-01-17 DIAGNOSIS — D631 Anemia in chronic kidney disease: Secondary | ICD-10-CM | POA: Diagnosis not present

## 2019-01-18 DIAGNOSIS — N186 End stage renal disease: Secondary | ICD-10-CM | POA: Diagnosis not present

## 2019-01-18 DIAGNOSIS — D631 Anemia in chronic kidney disease: Secondary | ICD-10-CM | POA: Diagnosis not present

## 2019-01-18 DIAGNOSIS — E785 Hyperlipidemia, unspecified: Secondary | ICD-10-CM | POA: Diagnosis not present

## 2019-01-18 DIAGNOSIS — N2581 Secondary hyperparathyroidism of renal origin: Secondary | ICD-10-CM | POA: Diagnosis not present

## 2019-01-18 DIAGNOSIS — Z23 Encounter for immunization: Secondary | ICD-10-CM | POA: Diagnosis not present

## 2019-01-19 DIAGNOSIS — Z23 Encounter for immunization: Secondary | ICD-10-CM | POA: Diagnosis not present

## 2019-01-19 DIAGNOSIS — N186 End stage renal disease: Secondary | ICD-10-CM | POA: Diagnosis not present

## 2019-01-19 DIAGNOSIS — D631 Anemia in chronic kidney disease: Secondary | ICD-10-CM | POA: Diagnosis not present

## 2019-01-19 DIAGNOSIS — N2581 Secondary hyperparathyroidism of renal origin: Secondary | ICD-10-CM | POA: Diagnosis not present

## 2019-01-19 DIAGNOSIS — E785 Hyperlipidemia, unspecified: Secondary | ICD-10-CM | POA: Diagnosis not present

## 2019-01-20 DIAGNOSIS — N186 End stage renal disease: Secondary | ICD-10-CM | POA: Diagnosis not present

## 2019-01-20 DIAGNOSIS — Z23 Encounter for immunization: Secondary | ICD-10-CM | POA: Diagnosis not present

## 2019-01-20 DIAGNOSIS — D631 Anemia in chronic kidney disease: Secondary | ICD-10-CM | POA: Diagnosis not present

## 2019-01-20 DIAGNOSIS — N2581 Secondary hyperparathyroidism of renal origin: Secondary | ICD-10-CM | POA: Diagnosis not present

## 2019-01-20 DIAGNOSIS — E785 Hyperlipidemia, unspecified: Secondary | ICD-10-CM | POA: Diagnosis not present

## 2019-01-21 DIAGNOSIS — Z23 Encounter for immunization: Secondary | ICD-10-CM | POA: Diagnosis not present

## 2019-01-21 DIAGNOSIS — N186 End stage renal disease: Secondary | ICD-10-CM | POA: Diagnosis not present

## 2019-01-21 DIAGNOSIS — D631 Anemia in chronic kidney disease: Secondary | ICD-10-CM | POA: Diagnosis not present

## 2019-01-21 DIAGNOSIS — E785 Hyperlipidemia, unspecified: Secondary | ICD-10-CM | POA: Diagnosis not present

## 2019-01-21 DIAGNOSIS — N2581 Secondary hyperparathyroidism of renal origin: Secondary | ICD-10-CM | POA: Diagnosis not present

## 2019-01-22 DIAGNOSIS — Z23 Encounter for immunization: Secondary | ICD-10-CM | POA: Diagnosis not present

## 2019-01-22 DIAGNOSIS — N2581 Secondary hyperparathyroidism of renal origin: Secondary | ICD-10-CM | POA: Diagnosis not present

## 2019-01-22 DIAGNOSIS — D631 Anemia in chronic kidney disease: Secondary | ICD-10-CM | POA: Diagnosis not present

## 2019-01-22 DIAGNOSIS — E785 Hyperlipidemia, unspecified: Secondary | ICD-10-CM | POA: Diagnosis not present

## 2019-01-22 DIAGNOSIS — N186 End stage renal disease: Secondary | ICD-10-CM | POA: Diagnosis not present

## 2019-01-23 DIAGNOSIS — E785 Hyperlipidemia, unspecified: Secondary | ICD-10-CM | POA: Diagnosis not present

## 2019-01-23 DIAGNOSIS — N186 End stage renal disease: Secondary | ICD-10-CM | POA: Diagnosis not present

## 2019-01-23 DIAGNOSIS — N2581 Secondary hyperparathyroidism of renal origin: Secondary | ICD-10-CM | POA: Diagnosis not present

## 2019-01-23 DIAGNOSIS — D631 Anemia in chronic kidney disease: Secondary | ICD-10-CM | POA: Diagnosis not present

## 2019-01-23 DIAGNOSIS — Z23 Encounter for immunization: Secondary | ICD-10-CM | POA: Diagnosis not present

## 2019-01-24 DIAGNOSIS — D631 Anemia in chronic kidney disease: Secondary | ICD-10-CM | POA: Diagnosis not present

## 2019-01-24 DIAGNOSIS — N2581 Secondary hyperparathyroidism of renal origin: Secondary | ICD-10-CM | POA: Diagnosis not present

## 2019-01-24 DIAGNOSIS — Z23 Encounter for immunization: Secondary | ICD-10-CM | POA: Diagnosis not present

## 2019-01-24 DIAGNOSIS — E785 Hyperlipidemia, unspecified: Secondary | ICD-10-CM | POA: Diagnosis not present

## 2019-01-24 DIAGNOSIS — N186 End stage renal disease: Secondary | ICD-10-CM | POA: Diagnosis not present

## 2019-01-24 DIAGNOSIS — Z992 Dependence on renal dialysis: Secondary | ICD-10-CM | POA: Diagnosis not present

## 2019-01-25 DIAGNOSIS — N186 End stage renal disease: Secondary | ICD-10-CM | POA: Diagnosis not present

## 2019-01-25 DIAGNOSIS — D631 Anemia in chronic kidney disease: Secondary | ICD-10-CM | POA: Diagnosis not present

## 2019-01-25 DIAGNOSIS — N2581 Secondary hyperparathyroidism of renal origin: Secondary | ICD-10-CM | POA: Diagnosis not present

## 2019-01-26 DIAGNOSIS — N2581 Secondary hyperparathyroidism of renal origin: Secondary | ICD-10-CM | POA: Diagnosis not present

## 2019-01-26 DIAGNOSIS — N186 End stage renal disease: Secondary | ICD-10-CM | POA: Diagnosis not present

## 2019-01-26 DIAGNOSIS — D631 Anemia in chronic kidney disease: Secondary | ICD-10-CM | POA: Diagnosis not present

## 2019-01-27 DIAGNOSIS — N186 End stage renal disease: Secondary | ICD-10-CM | POA: Diagnosis not present

## 2019-01-27 DIAGNOSIS — D631 Anemia in chronic kidney disease: Secondary | ICD-10-CM | POA: Diagnosis not present

## 2019-01-27 DIAGNOSIS — N2581 Secondary hyperparathyroidism of renal origin: Secondary | ICD-10-CM | POA: Diagnosis not present

## 2019-01-28 DIAGNOSIS — N186 End stage renal disease: Secondary | ICD-10-CM | POA: Diagnosis not present

## 2019-01-28 DIAGNOSIS — D631 Anemia in chronic kidney disease: Secondary | ICD-10-CM | POA: Diagnosis not present

## 2019-01-28 DIAGNOSIS — N2581 Secondary hyperparathyroidism of renal origin: Secondary | ICD-10-CM | POA: Diagnosis not present

## 2019-01-29 DIAGNOSIS — E119 Type 2 diabetes mellitus without complications: Secondary | ICD-10-CM | POA: Diagnosis not present

## 2019-01-29 DIAGNOSIS — D631 Anemia in chronic kidney disease: Secondary | ICD-10-CM | POA: Diagnosis not present

## 2019-01-29 DIAGNOSIS — N186 End stage renal disease: Secondary | ICD-10-CM | POA: Diagnosis not present

## 2019-01-29 DIAGNOSIS — Z4932 Encounter for adequacy testing for peritoneal dialysis: Secondary | ICD-10-CM | POA: Diagnosis not present

## 2019-01-29 DIAGNOSIS — N2581 Secondary hyperparathyroidism of renal origin: Secondary | ICD-10-CM | POA: Diagnosis not present

## 2019-01-30 DIAGNOSIS — D631 Anemia in chronic kidney disease: Secondary | ICD-10-CM | POA: Diagnosis not present

## 2019-01-30 DIAGNOSIS — N2581 Secondary hyperparathyroidism of renal origin: Secondary | ICD-10-CM | POA: Diagnosis not present

## 2019-01-30 DIAGNOSIS — N186 End stage renal disease: Secondary | ICD-10-CM | POA: Diagnosis not present

## 2019-01-31 DIAGNOSIS — N186 End stage renal disease: Secondary | ICD-10-CM | POA: Diagnosis not present

## 2019-01-31 DIAGNOSIS — N2581 Secondary hyperparathyroidism of renal origin: Secondary | ICD-10-CM | POA: Diagnosis not present

## 2019-01-31 DIAGNOSIS — D631 Anemia in chronic kidney disease: Secondary | ICD-10-CM | POA: Diagnosis not present

## 2019-02-01 DIAGNOSIS — N186 End stage renal disease: Secondary | ICD-10-CM | POA: Diagnosis not present

## 2019-02-01 DIAGNOSIS — D631 Anemia in chronic kidney disease: Secondary | ICD-10-CM | POA: Diagnosis not present

## 2019-02-01 DIAGNOSIS — N2581 Secondary hyperparathyroidism of renal origin: Secondary | ICD-10-CM | POA: Diagnosis not present

## 2019-02-02 DIAGNOSIS — N186 End stage renal disease: Secondary | ICD-10-CM | POA: Diagnosis not present

## 2019-02-02 DIAGNOSIS — N2581 Secondary hyperparathyroidism of renal origin: Secondary | ICD-10-CM | POA: Diagnosis not present

## 2019-02-02 DIAGNOSIS — D631 Anemia in chronic kidney disease: Secondary | ICD-10-CM | POA: Diagnosis not present

## 2019-02-03 DIAGNOSIS — D631 Anemia in chronic kidney disease: Secondary | ICD-10-CM | POA: Diagnosis not present

## 2019-02-03 DIAGNOSIS — N186 End stage renal disease: Secondary | ICD-10-CM | POA: Diagnosis not present

## 2019-02-03 DIAGNOSIS — N2581 Secondary hyperparathyroidism of renal origin: Secondary | ICD-10-CM | POA: Diagnosis not present

## 2019-02-04 DIAGNOSIS — D631 Anemia in chronic kidney disease: Secondary | ICD-10-CM | POA: Diagnosis not present

## 2019-02-04 DIAGNOSIS — N2581 Secondary hyperparathyroidism of renal origin: Secondary | ICD-10-CM | POA: Diagnosis not present

## 2019-02-04 DIAGNOSIS — N186 End stage renal disease: Secondary | ICD-10-CM | POA: Diagnosis not present

## 2019-02-05 DIAGNOSIS — N2581 Secondary hyperparathyroidism of renal origin: Secondary | ICD-10-CM | POA: Diagnosis not present

## 2019-02-05 DIAGNOSIS — N186 End stage renal disease: Secondary | ICD-10-CM | POA: Diagnosis not present

## 2019-02-05 DIAGNOSIS — D631 Anemia in chronic kidney disease: Secondary | ICD-10-CM | POA: Diagnosis not present

## 2019-02-06 DIAGNOSIS — N2581 Secondary hyperparathyroidism of renal origin: Secondary | ICD-10-CM | POA: Diagnosis not present

## 2019-02-06 DIAGNOSIS — D631 Anemia in chronic kidney disease: Secondary | ICD-10-CM | POA: Diagnosis not present

## 2019-02-06 DIAGNOSIS — N186 End stage renal disease: Secondary | ICD-10-CM | POA: Diagnosis not present

## 2019-02-07 DIAGNOSIS — N186 End stage renal disease: Secondary | ICD-10-CM | POA: Diagnosis not present

## 2019-02-07 DIAGNOSIS — N2581 Secondary hyperparathyroidism of renal origin: Secondary | ICD-10-CM | POA: Diagnosis not present

## 2019-02-07 DIAGNOSIS — D631 Anemia in chronic kidney disease: Secondary | ICD-10-CM | POA: Diagnosis not present

## 2019-02-08 DIAGNOSIS — D631 Anemia in chronic kidney disease: Secondary | ICD-10-CM | POA: Diagnosis not present

## 2019-02-08 DIAGNOSIS — N186 End stage renal disease: Secondary | ICD-10-CM | POA: Diagnosis not present

## 2019-02-08 DIAGNOSIS — N2581 Secondary hyperparathyroidism of renal origin: Secondary | ICD-10-CM | POA: Diagnosis not present

## 2019-02-09 DIAGNOSIS — N2581 Secondary hyperparathyroidism of renal origin: Secondary | ICD-10-CM | POA: Diagnosis not present

## 2019-02-09 DIAGNOSIS — N186 End stage renal disease: Secondary | ICD-10-CM | POA: Diagnosis not present

## 2019-02-09 DIAGNOSIS — D631 Anemia in chronic kidney disease: Secondary | ICD-10-CM | POA: Diagnosis not present

## 2019-02-10 DIAGNOSIS — N2581 Secondary hyperparathyroidism of renal origin: Secondary | ICD-10-CM | POA: Diagnosis not present

## 2019-02-10 DIAGNOSIS — N186 End stage renal disease: Secondary | ICD-10-CM | POA: Diagnosis not present

## 2019-02-10 DIAGNOSIS — D631 Anemia in chronic kidney disease: Secondary | ICD-10-CM | POA: Diagnosis not present

## 2019-02-11 DIAGNOSIS — N2581 Secondary hyperparathyroidism of renal origin: Secondary | ICD-10-CM | POA: Diagnosis not present

## 2019-02-11 DIAGNOSIS — N186 End stage renal disease: Secondary | ICD-10-CM | POA: Diagnosis not present

## 2019-02-11 DIAGNOSIS — D631 Anemia in chronic kidney disease: Secondary | ICD-10-CM | POA: Diagnosis not present

## 2019-02-12 DIAGNOSIS — N2581 Secondary hyperparathyroidism of renal origin: Secondary | ICD-10-CM | POA: Diagnosis not present

## 2019-02-12 DIAGNOSIS — N186 End stage renal disease: Secondary | ICD-10-CM | POA: Diagnosis not present

## 2019-02-12 DIAGNOSIS — D631 Anemia in chronic kidney disease: Secondary | ICD-10-CM | POA: Diagnosis not present

## 2019-02-13 DIAGNOSIS — N2581 Secondary hyperparathyroidism of renal origin: Secondary | ICD-10-CM | POA: Diagnosis not present

## 2019-02-13 DIAGNOSIS — N186 End stage renal disease: Secondary | ICD-10-CM | POA: Diagnosis not present

## 2019-02-13 DIAGNOSIS — D631 Anemia in chronic kidney disease: Secondary | ICD-10-CM | POA: Diagnosis not present

## 2019-02-14 DIAGNOSIS — N2581 Secondary hyperparathyroidism of renal origin: Secondary | ICD-10-CM | POA: Diagnosis not present

## 2019-02-14 DIAGNOSIS — N186 End stage renal disease: Secondary | ICD-10-CM | POA: Diagnosis not present

## 2019-02-14 DIAGNOSIS — D631 Anemia in chronic kidney disease: Secondary | ICD-10-CM | POA: Diagnosis not present

## 2019-02-15 DIAGNOSIS — N186 End stage renal disease: Secondary | ICD-10-CM | POA: Diagnosis not present

## 2019-02-15 DIAGNOSIS — N2581 Secondary hyperparathyroidism of renal origin: Secondary | ICD-10-CM | POA: Diagnosis not present

## 2019-02-15 DIAGNOSIS — D631 Anemia in chronic kidney disease: Secondary | ICD-10-CM | POA: Diagnosis not present

## 2019-02-16 DIAGNOSIS — D631 Anemia in chronic kidney disease: Secondary | ICD-10-CM | POA: Diagnosis not present

## 2019-02-16 DIAGNOSIS — N2581 Secondary hyperparathyroidism of renal origin: Secondary | ICD-10-CM | POA: Diagnosis not present

## 2019-02-16 DIAGNOSIS — N186 End stage renal disease: Secondary | ICD-10-CM | POA: Diagnosis not present

## 2019-02-17 DIAGNOSIS — N2581 Secondary hyperparathyroidism of renal origin: Secondary | ICD-10-CM | POA: Diagnosis not present

## 2019-02-17 DIAGNOSIS — N186 End stage renal disease: Secondary | ICD-10-CM | POA: Diagnosis not present

## 2019-02-17 DIAGNOSIS — D631 Anemia in chronic kidney disease: Secondary | ICD-10-CM | POA: Diagnosis not present

## 2019-02-18 DIAGNOSIS — D631 Anemia in chronic kidney disease: Secondary | ICD-10-CM | POA: Diagnosis not present

## 2019-02-18 DIAGNOSIS — N186 End stage renal disease: Secondary | ICD-10-CM | POA: Diagnosis not present

## 2019-02-18 DIAGNOSIS — N2581 Secondary hyperparathyroidism of renal origin: Secondary | ICD-10-CM | POA: Diagnosis not present

## 2019-02-19 DIAGNOSIS — N2581 Secondary hyperparathyroidism of renal origin: Secondary | ICD-10-CM | POA: Diagnosis not present

## 2019-02-19 DIAGNOSIS — D631 Anemia in chronic kidney disease: Secondary | ICD-10-CM | POA: Diagnosis not present

## 2019-02-19 DIAGNOSIS — N186 End stage renal disease: Secondary | ICD-10-CM | POA: Diagnosis not present

## 2019-02-20 DIAGNOSIS — N186 End stage renal disease: Secondary | ICD-10-CM | POA: Diagnosis not present

## 2019-02-20 DIAGNOSIS — N2581 Secondary hyperparathyroidism of renal origin: Secondary | ICD-10-CM | POA: Diagnosis not present

## 2019-02-20 DIAGNOSIS — D631 Anemia in chronic kidney disease: Secondary | ICD-10-CM | POA: Diagnosis not present

## 2019-02-21 DIAGNOSIS — N186 End stage renal disease: Secondary | ICD-10-CM | POA: Diagnosis not present

## 2019-02-21 DIAGNOSIS — N2581 Secondary hyperparathyroidism of renal origin: Secondary | ICD-10-CM | POA: Diagnosis not present

## 2019-02-21 DIAGNOSIS — D631 Anemia in chronic kidney disease: Secondary | ICD-10-CM | POA: Diagnosis not present

## 2019-02-22 DIAGNOSIS — D631 Anemia in chronic kidney disease: Secondary | ICD-10-CM | POA: Diagnosis not present

## 2019-02-22 DIAGNOSIS — N2581 Secondary hyperparathyroidism of renal origin: Secondary | ICD-10-CM | POA: Diagnosis not present

## 2019-02-22 DIAGNOSIS — N186 End stage renal disease: Secondary | ICD-10-CM | POA: Diagnosis not present

## 2019-02-23 DIAGNOSIS — N2581 Secondary hyperparathyroidism of renal origin: Secondary | ICD-10-CM | POA: Diagnosis not present

## 2019-02-23 DIAGNOSIS — N186 End stage renal disease: Secondary | ICD-10-CM | POA: Diagnosis not present

## 2019-02-23 DIAGNOSIS — Z992 Dependence on renal dialysis: Secondary | ICD-10-CM | POA: Diagnosis not present

## 2019-02-23 DIAGNOSIS — D631 Anemia in chronic kidney disease: Secondary | ICD-10-CM | POA: Diagnosis not present

## 2019-03-27 DIAGNOSIS — E785 Hyperlipidemia, unspecified: Secondary | ICD-10-CM | POA: Diagnosis not present

## 2019-03-27 DIAGNOSIS — N2581 Secondary hyperparathyroidism of renal origin: Secondary | ICD-10-CM | POA: Diagnosis not present

## 2019-03-27 DIAGNOSIS — N186 End stage renal disease: Secondary | ICD-10-CM | POA: Diagnosis not present

## 2019-03-27 DIAGNOSIS — D631 Anemia in chronic kidney disease: Secondary | ICD-10-CM | POA: Diagnosis not present

## 2019-03-28 DIAGNOSIS — E785 Hyperlipidemia, unspecified: Secondary | ICD-10-CM | POA: Diagnosis not present

## 2019-03-28 DIAGNOSIS — N2581 Secondary hyperparathyroidism of renal origin: Secondary | ICD-10-CM | POA: Diagnosis not present

## 2019-03-28 DIAGNOSIS — D631 Anemia in chronic kidney disease: Secondary | ICD-10-CM | POA: Diagnosis not present

## 2019-03-28 DIAGNOSIS — N186 End stage renal disease: Secondary | ICD-10-CM | POA: Diagnosis not present

## 2019-03-29 DIAGNOSIS — D631 Anemia in chronic kidney disease: Secondary | ICD-10-CM | POA: Diagnosis not present

## 2019-03-29 DIAGNOSIS — N2581 Secondary hyperparathyroidism of renal origin: Secondary | ICD-10-CM | POA: Diagnosis not present

## 2019-03-29 DIAGNOSIS — N186 End stage renal disease: Secondary | ICD-10-CM | POA: Diagnosis not present

## 2019-03-29 DIAGNOSIS — E785 Hyperlipidemia, unspecified: Secondary | ICD-10-CM | POA: Diagnosis not present

## 2019-03-30 DIAGNOSIS — D631 Anemia in chronic kidney disease: Secondary | ICD-10-CM | POA: Diagnosis not present

## 2019-03-30 DIAGNOSIS — N186 End stage renal disease: Secondary | ICD-10-CM | POA: Diagnosis not present

## 2019-03-30 DIAGNOSIS — N2581 Secondary hyperparathyroidism of renal origin: Secondary | ICD-10-CM | POA: Diagnosis not present

## 2019-03-30 DIAGNOSIS — E785 Hyperlipidemia, unspecified: Secondary | ICD-10-CM | POA: Diagnosis not present

## 2019-03-31 DIAGNOSIS — D631 Anemia in chronic kidney disease: Secondary | ICD-10-CM | POA: Diagnosis not present

## 2019-03-31 DIAGNOSIS — N186 End stage renal disease: Secondary | ICD-10-CM | POA: Diagnosis not present

## 2019-03-31 DIAGNOSIS — N2581 Secondary hyperparathyroidism of renal origin: Secondary | ICD-10-CM | POA: Diagnosis not present

## 2019-03-31 DIAGNOSIS — E785 Hyperlipidemia, unspecified: Secondary | ICD-10-CM | POA: Diagnosis not present

## 2019-04-01 DIAGNOSIS — D631 Anemia in chronic kidney disease: Secondary | ICD-10-CM | POA: Diagnosis not present

## 2019-04-01 DIAGNOSIS — N186 End stage renal disease: Secondary | ICD-10-CM | POA: Diagnosis not present

## 2019-04-01 DIAGNOSIS — N2581 Secondary hyperparathyroidism of renal origin: Secondary | ICD-10-CM | POA: Diagnosis not present

## 2019-04-01 DIAGNOSIS — E785 Hyperlipidemia, unspecified: Secondary | ICD-10-CM | POA: Diagnosis not present

## 2019-04-02 DIAGNOSIS — D631 Anemia in chronic kidney disease: Secondary | ICD-10-CM | POA: Diagnosis not present

## 2019-04-02 DIAGNOSIS — N186 End stage renal disease: Secondary | ICD-10-CM | POA: Diagnosis not present

## 2019-04-02 DIAGNOSIS — N2581 Secondary hyperparathyroidism of renal origin: Secondary | ICD-10-CM | POA: Diagnosis not present

## 2019-04-02 DIAGNOSIS — E785 Hyperlipidemia, unspecified: Secondary | ICD-10-CM | POA: Diagnosis not present

## 2019-04-03 DIAGNOSIS — Z1159 Encounter for screening for other viral diseases: Secondary | ICD-10-CM | POA: Diagnosis not present

## 2019-04-03 DIAGNOSIS — Z79899 Other long term (current) drug therapy: Secondary | ICD-10-CM | POA: Diagnosis not present

## 2019-04-03 DIAGNOSIS — E119 Type 2 diabetes mellitus without complications: Secondary | ICD-10-CM | POA: Diagnosis not present

## 2019-04-03 DIAGNOSIS — N186 End stage renal disease: Secondary | ICD-10-CM | POA: Diagnosis not present

## 2019-04-03 DIAGNOSIS — D631 Anemia in chronic kidney disease: Secondary | ICD-10-CM | POA: Diagnosis not present

## 2019-04-03 DIAGNOSIS — N2581 Secondary hyperparathyroidism of renal origin: Secondary | ICD-10-CM | POA: Diagnosis not present

## 2019-04-03 DIAGNOSIS — E785 Hyperlipidemia, unspecified: Secondary | ICD-10-CM | POA: Diagnosis not present

## 2019-04-04 DIAGNOSIS — N186 End stage renal disease: Secondary | ICD-10-CM | POA: Diagnosis not present

## 2019-04-04 DIAGNOSIS — D631 Anemia in chronic kidney disease: Secondary | ICD-10-CM | POA: Diagnosis not present

## 2019-04-04 DIAGNOSIS — N2581 Secondary hyperparathyroidism of renal origin: Secondary | ICD-10-CM | POA: Diagnosis not present

## 2019-04-04 DIAGNOSIS — E785 Hyperlipidemia, unspecified: Secondary | ICD-10-CM | POA: Diagnosis not present

## 2019-04-05 DIAGNOSIS — N186 End stage renal disease: Secondary | ICD-10-CM | POA: Diagnosis not present

## 2019-04-05 DIAGNOSIS — N2581 Secondary hyperparathyroidism of renal origin: Secondary | ICD-10-CM | POA: Diagnosis not present

## 2019-04-05 DIAGNOSIS — E785 Hyperlipidemia, unspecified: Secondary | ICD-10-CM | POA: Diagnosis not present

## 2019-04-05 DIAGNOSIS — D631 Anemia in chronic kidney disease: Secondary | ICD-10-CM | POA: Diagnosis not present

## 2019-04-06 DIAGNOSIS — N2581 Secondary hyperparathyroidism of renal origin: Secondary | ICD-10-CM | POA: Diagnosis not present

## 2019-04-06 DIAGNOSIS — E785 Hyperlipidemia, unspecified: Secondary | ICD-10-CM | POA: Diagnosis not present

## 2019-04-06 DIAGNOSIS — D631 Anemia in chronic kidney disease: Secondary | ICD-10-CM | POA: Diagnosis not present

## 2019-04-06 DIAGNOSIS — N186 End stage renal disease: Secondary | ICD-10-CM | POA: Diagnosis not present

## 2019-04-07 DIAGNOSIS — N186 End stage renal disease: Secondary | ICD-10-CM | POA: Diagnosis not present

## 2019-04-07 DIAGNOSIS — D631 Anemia in chronic kidney disease: Secondary | ICD-10-CM | POA: Diagnosis not present

## 2019-04-07 DIAGNOSIS — E785 Hyperlipidemia, unspecified: Secondary | ICD-10-CM | POA: Diagnosis not present

## 2019-04-07 DIAGNOSIS — N2581 Secondary hyperparathyroidism of renal origin: Secondary | ICD-10-CM | POA: Diagnosis not present

## 2019-04-08 DIAGNOSIS — N2581 Secondary hyperparathyroidism of renal origin: Secondary | ICD-10-CM | POA: Diagnosis not present

## 2019-04-08 DIAGNOSIS — N186 End stage renal disease: Secondary | ICD-10-CM | POA: Diagnosis not present

## 2019-04-08 DIAGNOSIS — D631 Anemia in chronic kidney disease: Secondary | ICD-10-CM | POA: Diagnosis not present

## 2019-04-08 DIAGNOSIS — E785 Hyperlipidemia, unspecified: Secondary | ICD-10-CM | POA: Diagnosis not present

## 2019-04-09 DIAGNOSIS — E785 Hyperlipidemia, unspecified: Secondary | ICD-10-CM | POA: Diagnosis not present

## 2019-04-09 DIAGNOSIS — N186 End stage renal disease: Secondary | ICD-10-CM | POA: Diagnosis not present

## 2019-04-09 DIAGNOSIS — N2581 Secondary hyperparathyroidism of renal origin: Secondary | ICD-10-CM | POA: Diagnosis not present

## 2019-04-09 DIAGNOSIS — D631 Anemia in chronic kidney disease: Secondary | ICD-10-CM | POA: Diagnosis not present

## 2019-04-10 DIAGNOSIS — D631 Anemia in chronic kidney disease: Secondary | ICD-10-CM | POA: Diagnosis not present

## 2019-04-10 DIAGNOSIS — N186 End stage renal disease: Secondary | ICD-10-CM | POA: Diagnosis not present

## 2019-04-10 DIAGNOSIS — E785 Hyperlipidemia, unspecified: Secondary | ICD-10-CM | POA: Diagnosis not present

## 2019-04-10 DIAGNOSIS — N2581 Secondary hyperparathyroidism of renal origin: Secondary | ICD-10-CM | POA: Diagnosis not present

## 2019-04-11 DIAGNOSIS — E785 Hyperlipidemia, unspecified: Secondary | ICD-10-CM | POA: Diagnosis not present

## 2019-04-11 DIAGNOSIS — N186 End stage renal disease: Secondary | ICD-10-CM | POA: Diagnosis not present

## 2019-04-11 DIAGNOSIS — D631 Anemia in chronic kidney disease: Secondary | ICD-10-CM | POA: Diagnosis not present

## 2019-04-11 DIAGNOSIS — N2581 Secondary hyperparathyroidism of renal origin: Secondary | ICD-10-CM | POA: Diagnosis not present

## 2019-04-12 DIAGNOSIS — N186 End stage renal disease: Secondary | ICD-10-CM | POA: Diagnosis not present

## 2019-04-12 DIAGNOSIS — D631 Anemia in chronic kidney disease: Secondary | ICD-10-CM | POA: Diagnosis not present

## 2019-04-12 DIAGNOSIS — E785 Hyperlipidemia, unspecified: Secondary | ICD-10-CM | POA: Diagnosis not present

## 2019-04-12 DIAGNOSIS — N2581 Secondary hyperparathyroidism of renal origin: Secondary | ICD-10-CM | POA: Diagnosis not present

## 2019-04-13 DIAGNOSIS — E785 Hyperlipidemia, unspecified: Secondary | ICD-10-CM | POA: Diagnosis not present

## 2019-04-13 DIAGNOSIS — N186 End stage renal disease: Secondary | ICD-10-CM | POA: Diagnosis not present

## 2019-04-13 DIAGNOSIS — N2581 Secondary hyperparathyroidism of renal origin: Secondary | ICD-10-CM | POA: Diagnosis not present

## 2019-04-13 DIAGNOSIS — D631 Anemia in chronic kidney disease: Secondary | ICD-10-CM | POA: Diagnosis not present

## 2019-04-14 DIAGNOSIS — N2581 Secondary hyperparathyroidism of renal origin: Secondary | ICD-10-CM | POA: Diagnosis not present

## 2019-04-14 DIAGNOSIS — N186 End stage renal disease: Secondary | ICD-10-CM | POA: Diagnosis not present

## 2019-04-14 DIAGNOSIS — D631 Anemia in chronic kidney disease: Secondary | ICD-10-CM | POA: Diagnosis not present

## 2019-04-14 DIAGNOSIS — E785 Hyperlipidemia, unspecified: Secondary | ICD-10-CM | POA: Diagnosis not present

## 2019-04-15 DIAGNOSIS — N2581 Secondary hyperparathyroidism of renal origin: Secondary | ICD-10-CM | POA: Diagnosis not present

## 2019-04-15 DIAGNOSIS — N186 End stage renal disease: Secondary | ICD-10-CM | POA: Diagnosis not present

## 2019-04-15 DIAGNOSIS — E785 Hyperlipidemia, unspecified: Secondary | ICD-10-CM | POA: Diagnosis not present

## 2019-04-15 DIAGNOSIS — D631 Anemia in chronic kidney disease: Secondary | ICD-10-CM | POA: Diagnosis not present

## 2019-04-16 DIAGNOSIS — D631 Anemia in chronic kidney disease: Secondary | ICD-10-CM | POA: Diagnosis not present

## 2019-04-16 DIAGNOSIS — E785 Hyperlipidemia, unspecified: Secondary | ICD-10-CM | POA: Diagnosis not present

## 2019-04-16 DIAGNOSIS — N2581 Secondary hyperparathyroidism of renal origin: Secondary | ICD-10-CM | POA: Diagnosis not present

## 2019-04-16 DIAGNOSIS — N186 End stage renal disease: Secondary | ICD-10-CM | POA: Diagnosis not present

## 2019-04-17 DIAGNOSIS — E785 Hyperlipidemia, unspecified: Secondary | ICD-10-CM | POA: Diagnosis not present

## 2019-04-17 DIAGNOSIS — N2581 Secondary hyperparathyroidism of renal origin: Secondary | ICD-10-CM | POA: Diagnosis not present

## 2019-04-17 DIAGNOSIS — N186 End stage renal disease: Secondary | ICD-10-CM | POA: Diagnosis not present

## 2019-04-17 DIAGNOSIS — D631 Anemia in chronic kidney disease: Secondary | ICD-10-CM | POA: Diagnosis not present

## 2019-04-18 DIAGNOSIS — N2581 Secondary hyperparathyroidism of renal origin: Secondary | ICD-10-CM | POA: Diagnosis not present

## 2019-04-18 DIAGNOSIS — E785 Hyperlipidemia, unspecified: Secondary | ICD-10-CM | POA: Diagnosis not present

## 2019-04-18 DIAGNOSIS — D631 Anemia in chronic kidney disease: Secondary | ICD-10-CM | POA: Diagnosis not present

## 2019-04-18 DIAGNOSIS — N186 End stage renal disease: Secondary | ICD-10-CM | POA: Diagnosis not present

## 2019-04-19 DIAGNOSIS — N2581 Secondary hyperparathyroidism of renal origin: Secondary | ICD-10-CM | POA: Diagnosis not present

## 2019-04-19 DIAGNOSIS — E785 Hyperlipidemia, unspecified: Secondary | ICD-10-CM | POA: Diagnosis not present

## 2019-04-19 DIAGNOSIS — D631 Anemia in chronic kidney disease: Secondary | ICD-10-CM | POA: Diagnosis not present

## 2019-04-19 DIAGNOSIS — N186 End stage renal disease: Secondary | ICD-10-CM | POA: Diagnosis not present

## 2019-04-20 DIAGNOSIS — E785 Hyperlipidemia, unspecified: Secondary | ICD-10-CM | POA: Diagnosis not present

## 2019-04-20 DIAGNOSIS — D631 Anemia in chronic kidney disease: Secondary | ICD-10-CM | POA: Diagnosis not present

## 2019-04-20 DIAGNOSIS — N186 End stage renal disease: Secondary | ICD-10-CM | POA: Diagnosis not present

## 2019-04-20 DIAGNOSIS — N2581 Secondary hyperparathyroidism of renal origin: Secondary | ICD-10-CM | POA: Diagnosis not present

## 2019-04-21 DIAGNOSIS — N2581 Secondary hyperparathyroidism of renal origin: Secondary | ICD-10-CM | POA: Diagnosis not present

## 2019-04-21 DIAGNOSIS — E785 Hyperlipidemia, unspecified: Secondary | ICD-10-CM | POA: Diagnosis not present

## 2019-04-21 DIAGNOSIS — D631 Anemia in chronic kidney disease: Secondary | ICD-10-CM | POA: Diagnosis not present

## 2019-04-21 DIAGNOSIS — N186 End stage renal disease: Secondary | ICD-10-CM | POA: Diagnosis not present

## 2019-04-22 DIAGNOSIS — N186 End stage renal disease: Secondary | ICD-10-CM | POA: Diagnosis not present

## 2019-04-22 DIAGNOSIS — D631 Anemia in chronic kidney disease: Secondary | ICD-10-CM | POA: Diagnosis not present

## 2019-04-22 DIAGNOSIS — E785 Hyperlipidemia, unspecified: Secondary | ICD-10-CM | POA: Diagnosis not present

## 2019-04-22 DIAGNOSIS — N2581 Secondary hyperparathyroidism of renal origin: Secondary | ICD-10-CM | POA: Diagnosis not present

## 2019-04-23 DIAGNOSIS — D631 Anemia in chronic kidney disease: Secondary | ICD-10-CM | POA: Diagnosis not present

## 2019-04-23 DIAGNOSIS — N186 End stage renal disease: Secondary | ICD-10-CM | POA: Diagnosis not present

## 2019-04-23 DIAGNOSIS — N2581 Secondary hyperparathyroidism of renal origin: Secondary | ICD-10-CM | POA: Diagnosis not present

## 2019-04-23 DIAGNOSIS — E785 Hyperlipidemia, unspecified: Secondary | ICD-10-CM | POA: Diagnosis not present

## 2019-04-23 DIAGNOSIS — Z4932 Encounter for adequacy testing for peritoneal dialysis: Secondary | ICD-10-CM | POA: Diagnosis not present

## 2019-04-24 DIAGNOSIS — D631 Anemia in chronic kidney disease: Secondary | ICD-10-CM | POA: Diagnosis not present

## 2019-04-24 DIAGNOSIS — N2581 Secondary hyperparathyroidism of renal origin: Secondary | ICD-10-CM | POA: Diagnosis not present

## 2019-04-24 DIAGNOSIS — E785 Hyperlipidemia, unspecified: Secondary | ICD-10-CM | POA: Diagnosis not present

## 2019-04-24 DIAGNOSIS — N186 End stage renal disease: Secondary | ICD-10-CM | POA: Diagnosis not present

## 2019-04-25 DIAGNOSIS — D631 Anemia in chronic kidney disease: Secondary | ICD-10-CM | POA: Diagnosis not present

## 2019-04-25 DIAGNOSIS — E785 Hyperlipidemia, unspecified: Secondary | ICD-10-CM | POA: Diagnosis not present

## 2019-04-25 DIAGNOSIS — N186 End stage renal disease: Secondary | ICD-10-CM | POA: Diagnosis not present

## 2019-04-25 DIAGNOSIS — N2581 Secondary hyperparathyroidism of renal origin: Secondary | ICD-10-CM | POA: Diagnosis not present

## 2019-04-26 DIAGNOSIS — E785 Hyperlipidemia, unspecified: Secondary | ICD-10-CM | POA: Diagnosis not present

## 2019-04-26 DIAGNOSIS — N186 End stage renal disease: Secondary | ICD-10-CM | POA: Diagnosis not present

## 2019-04-26 DIAGNOSIS — Z992 Dependence on renal dialysis: Secondary | ICD-10-CM | POA: Diagnosis not present

## 2019-04-26 DIAGNOSIS — D631 Anemia in chronic kidney disease: Secondary | ICD-10-CM | POA: Diagnosis not present

## 2019-04-26 DIAGNOSIS — N2581 Secondary hyperparathyroidism of renal origin: Secondary | ICD-10-CM | POA: Diagnosis not present

## 2019-04-27 DIAGNOSIS — N2581 Secondary hyperparathyroidism of renal origin: Secondary | ICD-10-CM | POA: Diagnosis not present

## 2019-04-27 DIAGNOSIS — D631 Anemia in chronic kidney disease: Secondary | ICD-10-CM | POA: Diagnosis not present

## 2019-04-27 DIAGNOSIS — N186 End stage renal disease: Secondary | ICD-10-CM | POA: Diagnosis not present

## 2019-04-28 DIAGNOSIS — N186 End stage renal disease: Secondary | ICD-10-CM | POA: Diagnosis not present

## 2019-04-28 DIAGNOSIS — N2581 Secondary hyperparathyroidism of renal origin: Secondary | ICD-10-CM | POA: Diagnosis not present

## 2019-04-28 DIAGNOSIS — D631 Anemia in chronic kidney disease: Secondary | ICD-10-CM | POA: Diagnosis not present

## 2019-04-29 DIAGNOSIS — D631 Anemia in chronic kidney disease: Secondary | ICD-10-CM | POA: Diagnosis not present

## 2019-04-29 DIAGNOSIS — N186 End stage renal disease: Secondary | ICD-10-CM | POA: Diagnosis not present

## 2019-04-29 DIAGNOSIS — N2581 Secondary hyperparathyroidism of renal origin: Secondary | ICD-10-CM | POA: Diagnosis not present

## 2019-04-30 DIAGNOSIS — N2581 Secondary hyperparathyroidism of renal origin: Secondary | ICD-10-CM | POA: Diagnosis not present

## 2019-04-30 DIAGNOSIS — D631 Anemia in chronic kidney disease: Secondary | ICD-10-CM | POA: Diagnosis not present

## 2019-04-30 DIAGNOSIS — N186 End stage renal disease: Secondary | ICD-10-CM | POA: Diagnosis not present

## 2019-05-01 DIAGNOSIS — N2581 Secondary hyperparathyroidism of renal origin: Secondary | ICD-10-CM | POA: Diagnosis not present

## 2019-05-01 DIAGNOSIS — D631 Anemia in chronic kidney disease: Secondary | ICD-10-CM | POA: Diagnosis not present

## 2019-05-01 DIAGNOSIS — E119 Type 2 diabetes mellitus without complications: Secondary | ICD-10-CM | POA: Diagnosis not present

## 2019-05-01 DIAGNOSIS — N186 End stage renal disease: Secondary | ICD-10-CM | POA: Diagnosis not present

## 2019-05-02 DIAGNOSIS — N2581 Secondary hyperparathyroidism of renal origin: Secondary | ICD-10-CM | POA: Diagnosis not present

## 2019-05-02 DIAGNOSIS — N186 End stage renal disease: Secondary | ICD-10-CM | POA: Diagnosis not present

## 2019-05-02 DIAGNOSIS — D631 Anemia in chronic kidney disease: Secondary | ICD-10-CM | POA: Diagnosis not present

## 2019-05-03 DIAGNOSIS — N186 End stage renal disease: Secondary | ICD-10-CM | POA: Diagnosis not present

## 2019-05-03 DIAGNOSIS — D631 Anemia in chronic kidney disease: Secondary | ICD-10-CM | POA: Diagnosis not present

## 2019-05-03 DIAGNOSIS — N2581 Secondary hyperparathyroidism of renal origin: Secondary | ICD-10-CM | POA: Diagnosis not present

## 2019-05-04 DIAGNOSIS — N186 End stage renal disease: Secondary | ICD-10-CM | POA: Diagnosis not present

## 2019-05-04 DIAGNOSIS — N2581 Secondary hyperparathyroidism of renal origin: Secondary | ICD-10-CM | POA: Diagnosis not present

## 2019-05-04 DIAGNOSIS — D631 Anemia in chronic kidney disease: Secondary | ICD-10-CM | POA: Diagnosis not present

## 2019-05-05 DIAGNOSIS — N2581 Secondary hyperparathyroidism of renal origin: Secondary | ICD-10-CM | POA: Diagnosis not present

## 2019-05-05 DIAGNOSIS — N186 End stage renal disease: Secondary | ICD-10-CM | POA: Diagnosis not present

## 2019-05-05 DIAGNOSIS — D631 Anemia in chronic kidney disease: Secondary | ICD-10-CM | POA: Diagnosis not present

## 2019-05-06 DIAGNOSIS — N186 End stage renal disease: Secondary | ICD-10-CM | POA: Diagnosis not present

## 2019-05-06 DIAGNOSIS — N2581 Secondary hyperparathyroidism of renal origin: Secondary | ICD-10-CM | POA: Diagnosis not present

## 2019-05-06 DIAGNOSIS — D631 Anemia in chronic kidney disease: Secondary | ICD-10-CM | POA: Diagnosis not present

## 2019-05-07 DIAGNOSIS — N186 End stage renal disease: Secondary | ICD-10-CM | POA: Diagnosis not present

## 2019-05-07 DIAGNOSIS — N2581 Secondary hyperparathyroidism of renal origin: Secondary | ICD-10-CM | POA: Diagnosis not present

## 2019-05-07 DIAGNOSIS — D631 Anemia in chronic kidney disease: Secondary | ICD-10-CM | POA: Diagnosis not present

## 2019-05-08 DIAGNOSIS — N2581 Secondary hyperparathyroidism of renal origin: Secondary | ICD-10-CM | POA: Diagnosis not present

## 2019-05-08 DIAGNOSIS — N186 End stage renal disease: Secondary | ICD-10-CM | POA: Diagnosis not present

## 2019-05-08 DIAGNOSIS — D631 Anemia in chronic kidney disease: Secondary | ICD-10-CM | POA: Diagnosis not present

## 2019-05-09 DIAGNOSIS — D631 Anemia in chronic kidney disease: Secondary | ICD-10-CM | POA: Diagnosis not present

## 2019-05-09 DIAGNOSIS — N2581 Secondary hyperparathyroidism of renal origin: Secondary | ICD-10-CM | POA: Diagnosis not present

## 2019-05-09 DIAGNOSIS — N186 End stage renal disease: Secondary | ICD-10-CM | POA: Diagnosis not present

## 2019-05-10 DIAGNOSIS — D631 Anemia in chronic kidney disease: Secondary | ICD-10-CM | POA: Diagnosis not present

## 2019-05-10 DIAGNOSIS — N186 End stage renal disease: Secondary | ICD-10-CM | POA: Diagnosis not present

## 2019-05-10 DIAGNOSIS — N2581 Secondary hyperparathyroidism of renal origin: Secondary | ICD-10-CM | POA: Diagnosis not present

## 2019-05-11 DIAGNOSIS — N2581 Secondary hyperparathyroidism of renal origin: Secondary | ICD-10-CM | POA: Diagnosis not present

## 2019-05-11 DIAGNOSIS — D631 Anemia in chronic kidney disease: Secondary | ICD-10-CM | POA: Diagnosis not present

## 2019-05-11 DIAGNOSIS — N186 End stage renal disease: Secondary | ICD-10-CM | POA: Diagnosis not present

## 2019-05-12 DIAGNOSIS — N2581 Secondary hyperparathyroidism of renal origin: Secondary | ICD-10-CM | POA: Diagnosis not present

## 2019-05-12 DIAGNOSIS — D631 Anemia in chronic kidney disease: Secondary | ICD-10-CM | POA: Diagnosis not present

## 2019-05-12 DIAGNOSIS — N186 End stage renal disease: Secondary | ICD-10-CM | POA: Diagnosis not present

## 2019-05-13 DIAGNOSIS — D631 Anemia in chronic kidney disease: Secondary | ICD-10-CM | POA: Diagnosis not present

## 2019-05-13 DIAGNOSIS — N2581 Secondary hyperparathyroidism of renal origin: Secondary | ICD-10-CM | POA: Diagnosis not present

## 2019-05-13 DIAGNOSIS — N186 End stage renal disease: Secondary | ICD-10-CM | POA: Diagnosis not present

## 2019-05-14 DIAGNOSIS — N2581 Secondary hyperparathyroidism of renal origin: Secondary | ICD-10-CM | POA: Diagnosis not present

## 2019-05-14 DIAGNOSIS — N186 End stage renal disease: Secondary | ICD-10-CM | POA: Diagnosis not present

## 2019-05-14 DIAGNOSIS — D631 Anemia in chronic kidney disease: Secondary | ICD-10-CM | POA: Diagnosis not present

## 2019-05-15 DIAGNOSIS — N186 End stage renal disease: Secondary | ICD-10-CM | POA: Diagnosis not present

## 2019-05-15 DIAGNOSIS — D631 Anemia in chronic kidney disease: Secondary | ICD-10-CM | POA: Diagnosis not present

## 2019-05-15 DIAGNOSIS — N2581 Secondary hyperparathyroidism of renal origin: Secondary | ICD-10-CM | POA: Diagnosis not present

## 2019-05-16 DIAGNOSIS — N2581 Secondary hyperparathyroidism of renal origin: Secondary | ICD-10-CM | POA: Diagnosis not present

## 2019-05-16 DIAGNOSIS — D631 Anemia in chronic kidney disease: Secondary | ICD-10-CM | POA: Diagnosis not present

## 2019-05-16 DIAGNOSIS — N186 End stage renal disease: Secondary | ICD-10-CM | POA: Diagnosis not present

## 2019-05-17 DIAGNOSIS — N2581 Secondary hyperparathyroidism of renal origin: Secondary | ICD-10-CM | POA: Diagnosis not present

## 2019-05-17 DIAGNOSIS — D631 Anemia in chronic kidney disease: Secondary | ICD-10-CM | POA: Diagnosis not present

## 2019-05-17 DIAGNOSIS — N186 End stage renal disease: Secondary | ICD-10-CM | POA: Diagnosis not present

## 2019-05-18 DIAGNOSIS — D631 Anemia in chronic kidney disease: Secondary | ICD-10-CM | POA: Diagnosis not present

## 2019-05-18 DIAGNOSIS — N186 End stage renal disease: Secondary | ICD-10-CM | POA: Diagnosis not present

## 2019-05-18 DIAGNOSIS — N2581 Secondary hyperparathyroidism of renal origin: Secondary | ICD-10-CM | POA: Diagnosis not present

## 2019-05-19 DIAGNOSIS — N2581 Secondary hyperparathyroidism of renal origin: Secondary | ICD-10-CM | POA: Diagnosis not present

## 2019-05-19 DIAGNOSIS — N186 End stage renal disease: Secondary | ICD-10-CM | POA: Diagnosis not present

## 2019-05-19 DIAGNOSIS — D631 Anemia in chronic kidney disease: Secondary | ICD-10-CM | POA: Diagnosis not present

## 2019-05-20 DIAGNOSIS — N186 End stage renal disease: Secondary | ICD-10-CM | POA: Diagnosis not present

## 2019-05-20 DIAGNOSIS — D631 Anemia in chronic kidney disease: Secondary | ICD-10-CM | POA: Diagnosis not present

## 2019-05-20 DIAGNOSIS — N2581 Secondary hyperparathyroidism of renal origin: Secondary | ICD-10-CM | POA: Diagnosis not present

## 2019-05-21 DIAGNOSIS — D631 Anemia in chronic kidney disease: Secondary | ICD-10-CM | POA: Diagnosis not present

## 2019-05-21 DIAGNOSIS — N2581 Secondary hyperparathyroidism of renal origin: Secondary | ICD-10-CM | POA: Diagnosis not present

## 2019-05-21 DIAGNOSIS — N186 End stage renal disease: Secondary | ICD-10-CM | POA: Diagnosis not present

## 2019-05-22 DIAGNOSIS — N2581 Secondary hyperparathyroidism of renal origin: Secondary | ICD-10-CM | POA: Diagnosis not present

## 2019-05-22 DIAGNOSIS — N186 End stage renal disease: Secondary | ICD-10-CM | POA: Diagnosis not present

## 2019-05-22 DIAGNOSIS — D631 Anemia in chronic kidney disease: Secondary | ICD-10-CM | POA: Diagnosis not present

## 2019-05-23 DIAGNOSIS — N186 End stage renal disease: Secondary | ICD-10-CM | POA: Diagnosis not present

## 2019-05-23 DIAGNOSIS — N2581 Secondary hyperparathyroidism of renal origin: Secondary | ICD-10-CM | POA: Diagnosis not present

## 2019-05-23 DIAGNOSIS — D631 Anemia in chronic kidney disease: Secondary | ICD-10-CM | POA: Diagnosis not present

## 2019-05-24 DIAGNOSIS — D631 Anemia in chronic kidney disease: Secondary | ICD-10-CM | POA: Diagnosis not present

## 2019-05-24 DIAGNOSIS — N2581 Secondary hyperparathyroidism of renal origin: Secondary | ICD-10-CM | POA: Diagnosis not present

## 2019-05-24 DIAGNOSIS — N186 End stage renal disease: Secondary | ICD-10-CM | POA: Diagnosis not present

## 2019-05-25 DIAGNOSIS — N186 End stage renal disease: Secondary | ICD-10-CM | POA: Diagnosis not present

## 2019-05-25 DIAGNOSIS — D631 Anemia in chronic kidney disease: Secondary | ICD-10-CM | POA: Diagnosis not present

## 2019-05-25 DIAGNOSIS — N2581 Secondary hyperparathyroidism of renal origin: Secondary | ICD-10-CM | POA: Diagnosis not present

## 2019-05-26 DIAGNOSIS — N2581 Secondary hyperparathyroidism of renal origin: Secondary | ICD-10-CM | POA: Diagnosis not present

## 2019-05-26 DIAGNOSIS — N186 End stage renal disease: Secondary | ICD-10-CM | POA: Diagnosis not present

## 2019-05-26 DIAGNOSIS — D631 Anemia in chronic kidney disease: Secondary | ICD-10-CM | POA: Diagnosis not present

## 2019-05-27 DIAGNOSIS — D631 Anemia in chronic kidney disease: Secondary | ICD-10-CM | POA: Diagnosis not present

## 2019-05-27 DIAGNOSIS — N2581 Secondary hyperparathyroidism of renal origin: Secondary | ICD-10-CM | POA: Diagnosis not present

## 2019-05-27 DIAGNOSIS — N186 End stage renal disease: Secondary | ICD-10-CM | POA: Diagnosis not present

## 2019-05-28 DIAGNOSIS — N2581 Secondary hyperparathyroidism of renal origin: Secondary | ICD-10-CM | POA: Diagnosis not present

## 2019-05-28 DIAGNOSIS — D631 Anemia in chronic kidney disease: Secondary | ICD-10-CM | POA: Diagnosis not present

## 2019-05-28 DIAGNOSIS — N186 End stage renal disease: Secondary | ICD-10-CM | POA: Diagnosis not present

## 2019-05-29 DIAGNOSIS — D631 Anemia in chronic kidney disease: Secondary | ICD-10-CM | POA: Diagnosis not present

## 2019-05-29 DIAGNOSIS — N2581 Secondary hyperparathyroidism of renal origin: Secondary | ICD-10-CM | POA: Diagnosis not present

## 2019-05-29 DIAGNOSIS — N186 End stage renal disease: Secondary | ICD-10-CM | POA: Diagnosis not present

## 2019-05-30 DIAGNOSIS — D631 Anemia in chronic kidney disease: Secondary | ICD-10-CM | POA: Diagnosis not present

## 2019-05-30 DIAGNOSIS — N186 End stage renal disease: Secondary | ICD-10-CM | POA: Diagnosis not present

## 2019-05-30 DIAGNOSIS — N2581 Secondary hyperparathyroidism of renal origin: Secondary | ICD-10-CM | POA: Diagnosis not present

## 2019-05-31 DIAGNOSIS — D631 Anemia in chronic kidney disease: Secondary | ICD-10-CM | POA: Diagnosis not present

## 2019-05-31 DIAGNOSIS — N2581 Secondary hyperparathyroidism of renal origin: Secondary | ICD-10-CM | POA: Diagnosis not present

## 2019-05-31 DIAGNOSIS — N186 End stage renal disease: Secondary | ICD-10-CM | POA: Diagnosis not present

## 2019-06-01 DIAGNOSIS — N2581 Secondary hyperparathyroidism of renal origin: Secondary | ICD-10-CM | POA: Diagnosis not present

## 2019-06-01 DIAGNOSIS — D631 Anemia in chronic kidney disease: Secondary | ICD-10-CM | POA: Diagnosis not present

## 2019-06-01 DIAGNOSIS — N186 End stage renal disease: Secondary | ICD-10-CM | POA: Diagnosis not present

## 2019-06-02 DIAGNOSIS — D631 Anemia in chronic kidney disease: Secondary | ICD-10-CM | POA: Diagnosis not present

## 2019-06-02 DIAGNOSIS — N2581 Secondary hyperparathyroidism of renal origin: Secondary | ICD-10-CM | POA: Diagnosis not present

## 2019-06-02 DIAGNOSIS — N186 End stage renal disease: Secondary | ICD-10-CM | POA: Diagnosis not present

## 2019-06-03 DIAGNOSIS — N186 End stage renal disease: Secondary | ICD-10-CM | POA: Diagnosis not present

## 2019-06-03 DIAGNOSIS — D631 Anemia in chronic kidney disease: Secondary | ICD-10-CM | POA: Diagnosis not present

## 2019-06-03 DIAGNOSIS — N2581 Secondary hyperparathyroidism of renal origin: Secondary | ICD-10-CM | POA: Diagnosis not present

## 2019-06-04 DIAGNOSIS — D631 Anemia in chronic kidney disease: Secondary | ICD-10-CM | POA: Diagnosis not present

## 2019-06-04 DIAGNOSIS — N186 End stage renal disease: Secondary | ICD-10-CM | POA: Diagnosis not present

## 2019-06-04 DIAGNOSIS — N2581 Secondary hyperparathyroidism of renal origin: Secondary | ICD-10-CM | POA: Diagnosis not present

## 2019-06-05 DIAGNOSIS — N186 End stage renal disease: Secondary | ICD-10-CM | POA: Diagnosis not present

## 2019-06-05 DIAGNOSIS — D631 Anemia in chronic kidney disease: Secondary | ICD-10-CM | POA: Diagnosis not present

## 2019-06-05 DIAGNOSIS — N2581 Secondary hyperparathyroidism of renal origin: Secondary | ICD-10-CM | POA: Diagnosis not present

## 2019-06-06 DIAGNOSIS — D631 Anemia in chronic kidney disease: Secondary | ICD-10-CM | POA: Diagnosis not present

## 2019-06-06 DIAGNOSIS — N2581 Secondary hyperparathyroidism of renal origin: Secondary | ICD-10-CM | POA: Diagnosis not present

## 2019-06-06 DIAGNOSIS — N186 End stage renal disease: Secondary | ICD-10-CM | POA: Diagnosis not present

## 2019-06-07 DIAGNOSIS — N186 End stage renal disease: Secondary | ICD-10-CM | POA: Diagnosis not present

## 2019-06-07 DIAGNOSIS — D631 Anemia in chronic kidney disease: Secondary | ICD-10-CM | POA: Diagnosis not present

## 2019-06-07 DIAGNOSIS — N2581 Secondary hyperparathyroidism of renal origin: Secondary | ICD-10-CM | POA: Diagnosis not present

## 2019-06-08 DIAGNOSIS — N2581 Secondary hyperparathyroidism of renal origin: Secondary | ICD-10-CM | POA: Diagnosis not present

## 2019-06-08 DIAGNOSIS — D631 Anemia in chronic kidney disease: Secondary | ICD-10-CM | POA: Diagnosis not present

## 2019-06-08 DIAGNOSIS — N186 End stage renal disease: Secondary | ICD-10-CM | POA: Diagnosis not present

## 2019-06-09 DIAGNOSIS — N186 End stage renal disease: Secondary | ICD-10-CM | POA: Diagnosis not present

## 2019-06-09 DIAGNOSIS — N2581 Secondary hyperparathyroidism of renal origin: Secondary | ICD-10-CM | POA: Diagnosis not present

## 2019-06-09 DIAGNOSIS — D631 Anemia in chronic kidney disease: Secondary | ICD-10-CM | POA: Diagnosis not present

## 2019-06-10 DIAGNOSIS — N186 End stage renal disease: Secondary | ICD-10-CM | POA: Diagnosis not present

## 2019-06-10 DIAGNOSIS — D631 Anemia in chronic kidney disease: Secondary | ICD-10-CM | POA: Diagnosis not present

## 2019-06-10 DIAGNOSIS — N2581 Secondary hyperparathyroidism of renal origin: Secondary | ICD-10-CM | POA: Diagnosis not present

## 2019-06-11 DIAGNOSIS — N2581 Secondary hyperparathyroidism of renal origin: Secondary | ICD-10-CM | POA: Diagnosis not present

## 2019-06-11 DIAGNOSIS — N186 End stage renal disease: Secondary | ICD-10-CM | POA: Diagnosis not present

## 2019-06-11 DIAGNOSIS — D631 Anemia in chronic kidney disease: Secondary | ICD-10-CM | POA: Diagnosis not present

## 2019-06-12 DIAGNOSIS — N2581 Secondary hyperparathyroidism of renal origin: Secondary | ICD-10-CM | POA: Diagnosis not present

## 2019-06-12 DIAGNOSIS — D631 Anemia in chronic kidney disease: Secondary | ICD-10-CM | POA: Diagnosis not present

## 2019-06-12 DIAGNOSIS — N186 End stage renal disease: Secondary | ICD-10-CM | POA: Diagnosis not present

## 2019-06-13 DIAGNOSIS — N186 End stage renal disease: Secondary | ICD-10-CM | POA: Diagnosis not present

## 2019-06-13 DIAGNOSIS — N2581 Secondary hyperparathyroidism of renal origin: Secondary | ICD-10-CM | POA: Diagnosis not present

## 2019-06-13 DIAGNOSIS — D631 Anemia in chronic kidney disease: Secondary | ICD-10-CM | POA: Diagnosis not present

## 2019-06-14 DIAGNOSIS — N186 End stage renal disease: Secondary | ICD-10-CM | POA: Diagnosis not present

## 2019-06-14 DIAGNOSIS — D631 Anemia in chronic kidney disease: Secondary | ICD-10-CM | POA: Diagnosis not present

## 2019-06-14 DIAGNOSIS — N2581 Secondary hyperparathyroidism of renal origin: Secondary | ICD-10-CM | POA: Diagnosis not present

## 2019-06-15 DIAGNOSIS — N2581 Secondary hyperparathyroidism of renal origin: Secondary | ICD-10-CM | POA: Diagnosis not present

## 2019-06-15 DIAGNOSIS — D631 Anemia in chronic kidney disease: Secondary | ICD-10-CM | POA: Diagnosis not present

## 2019-06-15 DIAGNOSIS — N186 End stage renal disease: Secondary | ICD-10-CM | POA: Diagnosis not present

## 2019-06-16 DIAGNOSIS — N2581 Secondary hyperparathyroidism of renal origin: Secondary | ICD-10-CM | POA: Diagnosis not present

## 2019-06-16 DIAGNOSIS — D631 Anemia in chronic kidney disease: Secondary | ICD-10-CM | POA: Diagnosis not present

## 2019-06-16 DIAGNOSIS — N186 End stage renal disease: Secondary | ICD-10-CM | POA: Diagnosis not present

## 2019-06-17 DIAGNOSIS — N186 End stage renal disease: Secondary | ICD-10-CM | POA: Diagnosis not present

## 2019-06-17 DIAGNOSIS — N2581 Secondary hyperparathyroidism of renal origin: Secondary | ICD-10-CM | POA: Diagnosis not present

## 2019-06-17 DIAGNOSIS — D631 Anemia in chronic kidney disease: Secondary | ICD-10-CM | POA: Diagnosis not present

## 2019-06-18 DIAGNOSIS — D631 Anemia in chronic kidney disease: Secondary | ICD-10-CM | POA: Diagnosis not present

## 2019-06-18 DIAGNOSIS — N2581 Secondary hyperparathyroidism of renal origin: Secondary | ICD-10-CM | POA: Diagnosis not present

## 2019-06-18 DIAGNOSIS — N186 End stage renal disease: Secondary | ICD-10-CM | POA: Diagnosis not present

## 2019-06-19 DIAGNOSIS — N2581 Secondary hyperparathyroidism of renal origin: Secondary | ICD-10-CM | POA: Diagnosis not present

## 2019-06-19 DIAGNOSIS — D631 Anemia in chronic kidney disease: Secondary | ICD-10-CM | POA: Diagnosis not present

## 2019-06-19 DIAGNOSIS — N186 End stage renal disease: Secondary | ICD-10-CM | POA: Diagnosis not present

## 2019-06-19 DIAGNOSIS — E119 Type 2 diabetes mellitus without complications: Secondary | ICD-10-CM | POA: Diagnosis not present

## 2019-06-20 DIAGNOSIS — N186 End stage renal disease: Secondary | ICD-10-CM | POA: Diagnosis not present

## 2019-06-20 DIAGNOSIS — N2581 Secondary hyperparathyroidism of renal origin: Secondary | ICD-10-CM | POA: Diagnosis not present

## 2019-06-20 DIAGNOSIS — D631 Anemia in chronic kidney disease: Secondary | ICD-10-CM | POA: Diagnosis not present

## 2019-06-21 DIAGNOSIS — D631 Anemia in chronic kidney disease: Secondary | ICD-10-CM | POA: Diagnosis not present

## 2019-06-21 DIAGNOSIS — N186 End stage renal disease: Secondary | ICD-10-CM | POA: Diagnosis not present

## 2019-06-21 DIAGNOSIS — N2581 Secondary hyperparathyroidism of renal origin: Secondary | ICD-10-CM | POA: Diagnosis not present

## 2019-06-22 DIAGNOSIS — N2581 Secondary hyperparathyroidism of renal origin: Secondary | ICD-10-CM | POA: Diagnosis not present

## 2019-06-22 DIAGNOSIS — D631 Anemia in chronic kidney disease: Secondary | ICD-10-CM | POA: Diagnosis not present

## 2019-06-22 DIAGNOSIS — N186 End stage renal disease: Secondary | ICD-10-CM | POA: Diagnosis not present

## 2019-06-23 DIAGNOSIS — D631 Anemia in chronic kidney disease: Secondary | ICD-10-CM | POA: Diagnosis not present

## 2019-06-23 DIAGNOSIS — N2581 Secondary hyperparathyroidism of renal origin: Secondary | ICD-10-CM | POA: Diagnosis not present

## 2019-06-23 DIAGNOSIS — N186 End stage renal disease: Secondary | ICD-10-CM | POA: Diagnosis not present

## 2019-06-24 DIAGNOSIS — D631 Anemia in chronic kidney disease: Secondary | ICD-10-CM | POA: Diagnosis not present

## 2019-06-24 DIAGNOSIS — N186 End stage renal disease: Secondary | ICD-10-CM | POA: Diagnosis not present

## 2019-06-24 DIAGNOSIS — N2581 Secondary hyperparathyroidism of renal origin: Secondary | ICD-10-CM | POA: Diagnosis not present

## 2019-06-25 DIAGNOSIS — N2581 Secondary hyperparathyroidism of renal origin: Secondary | ICD-10-CM | POA: Diagnosis not present

## 2019-06-25 DIAGNOSIS — N186 End stage renal disease: Secondary | ICD-10-CM | POA: Diagnosis not present

## 2019-06-25 DIAGNOSIS — D631 Anemia in chronic kidney disease: Secondary | ICD-10-CM | POA: Diagnosis not present

## 2019-06-26 DIAGNOSIS — Z4932 Encounter for adequacy testing for peritoneal dialysis: Secondary | ICD-10-CM | POA: Diagnosis not present

## 2019-06-26 DIAGNOSIS — E119 Type 2 diabetes mellitus without complications: Secondary | ICD-10-CM | POA: Diagnosis not present

## 2019-06-26 DIAGNOSIS — D631 Anemia in chronic kidney disease: Secondary | ICD-10-CM | POA: Diagnosis not present

## 2019-06-26 DIAGNOSIS — N2581 Secondary hyperparathyroidism of renal origin: Secondary | ICD-10-CM | POA: Diagnosis not present

## 2019-06-26 DIAGNOSIS — N186 End stage renal disease: Secondary | ICD-10-CM | POA: Diagnosis not present

## 2019-06-27 DIAGNOSIS — N186 End stage renal disease: Secondary | ICD-10-CM | POA: Diagnosis not present

## 2019-06-27 DIAGNOSIS — D631 Anemia in chronic kidney disease: Secondary | ICD-10-CM | POA: Diagnosis not present

## 2019-06-27 DIAGNOSIS — N2581 Secondary hyperparathyroidism of renal origin: Secondary | ICD-10-CM | POA: Diagnosis not present

## 2019-06-28 DIAGNOSIS — N2581 Secondary hyperparathyroidism of renal origin: Secondary | ICD-10-CM | POA: Diagnosis not present

## 2019-06-28 DIAGNOSIS — N186 End stage renal disease: Secondary | ICD-10-CM | POA: Diagnosis not present

## 2019-06-28 DIAGNOSIS — D631 Anemia in chronic kidney disease: Secondary | ICD-10-CM | POA: Diagnosis not present

## 2019-06-29 DIAGNOSIS — D631 Anemia in chronic kidney disease: Secondary | ICD-10-CM | POA: Diagnosis not present

## 2019-06-29 DIAGNOSIS — N186 End stage renal disease: Secondary | ICD-10-CM | POA: Diagnosis not present

## 2019-06-29 DIAGNOSIS — N2581 Secondary hyperparathyroidism of renal origin: Secondary | ICD-10-CM | POA: Diagnosis not present

## 2019-06-30 DIAGNOSIS — D631 Anemia in chronic kidney disease: Secondary | ICD-10-CM | POA: Diagnosis not present

## 2019-06-30 DIAGNOSIS — N2581 Secondary hyperparathyroidism of renal origin: Secondary | ICD-10-CM | POA: Diagnosis not present

## 2019-06-30 DIAGNOSIS — N186 End stage renal disease: Secondary | ICD-10-CM | POA: Diagnosis not present

## 2019-07-01 DIAGNOSIS — N2581 Secondary hyperparathyroidism of renal origin: Secondary | ICD-10-CM | POA: Diagnosis not present

## 2019-07-01 DIAGNOSIS — N186 End stage renal disease: Secondary | ICD-10-CM | POA: Diagnosis not present

## 2019-07-01 DIAGNOSIS — D631 Anemia in chronic kidney disease: Secondary | ICD-10-CM | POA: Diagnosis not present

## 2019-07-02 DIAGNOSIS — N186 End stage renal disease: Secondary | ICD-10-CM | POA: Diagnosis not present

## 2019-07-02 DIAGNOSIS — N2581 Secondary hyperparathyroidism of renal origin: Secondary | ICD-10-CM | POA: Diagnosis not present

## 2019-07-02 DIAGNOSIS — D631 Anemia in chronic kidney disease: Secondary | ICD-10-CM | POA: Diagnosis not present

## 2019-07-03 DIAGNOSIS — N186 End stage renal disease: Secondary | ICD-10-CM | POA: Diagnosis not present

## 2019-07-03 DIAGNOSIS — N2581 Secondary hyperparathyroidism of renal origin: Secondary | ICD-10-CM | POA: Diagnosis not present

## 2019-07-03 DIAGNOSIS — D631 Anemia in chronic kidney disease: Secondary | ICD-10-CM | POA: Diagnosis not present

## 2019-07-04 DIAGNOSIS — N186 End stage renal disease: Secondary | ICD-10-CM | POA: Diagnosis not present

## 2019-07-04 DIAGNOSIS — N2581 Secondary hyperparathyroidism of renal origin: Secondary | ICD-10-CM | POA: Diagnosis not present

## 2019-07-04 DIAGNOSIS — D631 Anemia in chronic kidney disease: Secondary | ICD-10-CM | POA: Diagnosis not present

## 2019-07-05 DIAGNOSIS — D631 Anemia in chronic kidney disease: Secondary | ICD-10-CM | POA: Diagnosis not present

## 2019-07-05 DIAGNOSIS — N186 End stage renal disease: Secondary | ICD-10-CM | POA: Diagnosis not present

## 2019-07-05 DIAGNOSIS — N2581 Secondary hyperparathyroidism of renal origin: Secondary | ICD-10-CM | POA: Diagnosis not present

## 2019-07-06 DIAGNOSIS — N186 End stage renal disease: Secondary | ICD-10-CM | POA: Diagnosis not present

## 2019-07-06 DIAGNOSIS — N2581 Secondary hyperparathyroidism of renal origin: Secondary | ICD-10-CM | POA: Diagnosis not present

## 2019-07-06 DIAGNOSIS — D631 Anemia in chronic kidney disease: Secondary | ICD-10-CM | POA: Diagnosis not present

## 2019-07-07 DIAGNOSIS — N2581 Secondary hyperparathyroidism of renal origin: Secondary | ICD-10-CM | POA: Diagnosis not present

## 2019-07-07 DIAGNOSIS — D631 Anemia in chronic kidney disease: Secondary | ICD-10-CM | POA: Diagnosis not present

## 2019-07-07 DIAGNOSIS — N186 End stage renal disease: Secondary | ICD-10-CM | POA: Diagnosis not present

## 2019-07-08 DIAGNOSIS — N186 End stage renal disease: Secondary | ICD-10-CM | POA: Diagnosis not present

## 2019-07-08 DIAGNOSIS — N2581 Secondary hyperparathyroidism of renal origin: Secondary | ICD-10-CM | POA: Diagnosis not present

## 2019-07-08 DIAGNOSIS — D631 Anemia in chronic kidney disease: Secondary | ICD-10-CM | POA: Diagnosis not present

## 2019-07-09 DIAGNOSIS — N186 End stage renal disease: Secondary | ICD-10-CM | POA: Diagnosis not present

## 2019-07-09 DIAGNOSIS — D631 Anemia in chronic kidney disease: Secondary | ICD-10-CM | POA: Diagnosis not present

## 2019-07-09 DIAGNOSIS — N2581 Secondary hyperparathyroidism of renal origin: Secondary | ICD-10-CM | POA: Diagnosis not present

## 2019-07-10 DIAGNOSIS — N2581 Secondary hyperparathyroidism of renal origin: Secondary | ICD-10-CM | POA: Diagnosis not present

## 2019-07-10 DIAGNOSIS — N186 End stage renal disease: Secondary | ICD-10-CM | POA: Diagnosis not present

## 2019-07-10 DIAGNOSIS — D631 Anemia in chronic kidney disease: Secondary | ICD-10-CM | POA: Diagnosis not present

## 2019-07-11 DIAGNOSIS — N186 End stage renal disease: Secondary | ICD-10-CM | POA: Diagnosis not present

## 2019-07-11 DIAGNOSIS — N2581 Secondary hyperparathyroidism of renal origin: Secondary | ICD-10-CM | POA: Diagnosis not present

## 2019-07-11 DIAGNOSIS — D631 Anemia in chronic kidney disease: Secondary | ICD-10-CM | POA: Diagnosis not present

## 2019-07-12 DIAGNOSIS — D631 Anemia in chronic kidney disease: Secondary | ICD-10-CM | POA: Diagnosis not present

## 2019-07-12 DIAGNOSIS — N2581 Secondary hyperparathyroidism of renal origin: Secondary | ICD-10-CM | POA: Diagnosis not present

## 2019-07-12 DIAGNOSIS — N186 End stage renal disease: Secondary | ICD-10-CM | POA: Diagnosis not present

## 2019-07-13 DIAGNOSIS — D631 Anemia in chronic kidney disease: Secondary | ICD-10-CM | POA: Diagnosis not present

## 2019-07-13 DIAGNOSIS — N2581 Secondary hyperparathyroidism of renal origin: Secondary | ICD-10-CM | POA: Diagnosis not present

## 2019-07-13 DIAGNOSIS — N186 End stage renal disease: Secondary | ICD-10-CM | POA: Diagnosis not present

## 2019-07-14 DIAGNOSIS — N186 End stage renal disease: Secondary | ICD-10-CM | POA: Diagnosis not present

## 2019-07-14 DIAGNOSIS — N2581 Secondary hyperparathyroidism of renal origin: Secondary | ICD-10-CM | POA: Diagnosis not present

## 2019-07-14 DIAGNOSIS — D631 Anemia in chronic kidney disease: Secondary | ICD-10-CM | POA: Diagnosis not present

## 2019-07-15 DIAGNOSIS — N2581 Secondary hyperparathyroidism of renal origin: Secondary | ICD-10-CM | POA: Diagnosis not present

## 2019-07-15 DIAGNOSIS — N186 End stage renal disease: Secondary | ICD-10-CM | POA: Diagnosis not present

## 2019-07-15 DIAGNOSIS — D631 Anemia in chronic kidney disease: Secondary | ICD-10-CM | POA: Diagnosis not present

## 2019-07-16 DIAGNOSIS — N2581 Secondary hyperparathyroidism of renal origin: Secondary | ICD-10-CM | POA: Diagnosis not present

## 2019-07-16 DIAGNOSIS — D631 Anemia in chronic kidney disease: Secondary | ICD-10-CM | POA: Diagnosis not present

## 2019-07-16 DIAGNOSIS — N186 End stage renal disease: Secondary | ICD-10-CM | POA: Diagnosis not present

## 2019-07-17 DIAGNOSIS — N2581 Secondary hyperparathyroidism of renal origin: Secondary | ICD-10-CM | POA: Diagnosis not present

## 2019-07-17 DIAGNOSIS — N186 End stage renal disease: Secondary | ICD-10-CM | POA: Diagnosis not present

## 2019-07-17 DIAGNOSIS — D631 Anemia in chronic kidney disease: Secondary | ICD-10-CM | POA: Diagnosis not present

## 2019-07-18 DIAGNOSIS — N2581 Secondary hyperparathyroidism of renal origin: Secondary | ICD-10-CM | POA: Diagnosis not present

## 2019-07-18 DIAGNOSIS — D631 Anemia in chronic kidney disease: Secondary | ICD-10-CM | POA: Diagnosis not present

## 2019-07-18 DIAGNOSIS — N186 End stage renal disease: Secondary | ICD-10-CM | POA: Diagnosis not present

## 2019-07-19 DIAGNOSIS — N186 End stage renal disease: Secondary | ICD-10-CM | POA: Diagnosis not present

## 2019-07-19 DIAGNOSIS — D631 Anemia in chronic kidney disease: Secondary | ICD-10-CM | POA: Diagnosis not present

## 2019-07-19 DIAGNOSIS — N2581 Secondary hyperparathyroidism of renal origin: Secondary | ICD-10-CM | POA: Diagnosis not present

## 2019-07-20 DIAGNOSIS — N186 End stage renal disease: Secondary | ICD-10-CM | POA: Diagnosis not present

## 2019-07-20 DIAGNOSIS — N2581 Secondary hyperparathyroidism of renal origin: Secondary | ICD-10-CM | POA: Diagnosis not present

## 2019-07-20 DIAGNOSIS — D631 Anemia in chronic kidney disease: Secondary | ICD-10-CM | POA: Diagnosis not present

## 2019-07-21 DIAGNOSIS — N186 End stage renal disease: Secondary | ICD-10-CM | POA: Diagnosis not present

## 2019-07-21 DIAGNOSIS — N2581 Secondary hyperparathyroidism of renal origin: Secondary | ICD-10-CM | POA: Diagnosis not present

## 2019-07-21 DIAGNOSIS — D631 Anemia in chronic kidney disease: Secondary | ICD-10-CM | POA: Diagnosis not present

## 2019-07-22 DIAGNOSIS — N2581 Secondary hyperparathyroidism of renal origin: Secondary | ICD-10-CM | POA: Diagnosis not present

## 2019-07-22 DIAGNOSIS — N186 End stage renal disease: Secondary | ICD-10-CM | POA: Diagnosis not present

## 2019-07-22 DIAGNOSIS — D631 Anemia in chronic kidney disease: Secondary | ICD-10-CM | POA: Diagnosis not present

## 2019-07-23 DIAGNOSIS — N2581 Secondary hyperparathyroidism of renal origin: Secondary | ICD-10-CM | POA: Diagnosis not present

## 2019-07-23 DIAGNOSIS — D631 Anemia in chronic kidney disease: Secondary | ICD-10-CM | POA: Diagnosis not present

## 2019-07-23 DIAGNOSIS — N186 End stage renal disease: Secondary | ICD-10-CM | POA: Diagnosis not present

## 2019-07-24 DIAGNOSIS — D631 Anemia in chronic kidney disease: Secondary | ICD-10-CM | POA: Diagnosis not present

## 2019-07-24 DIAGNOSIS — N186 End stage renal disease: Secondary | ICD-10-CM | POA: Diagnosis not present

## 2019-07-24 DIAGNOSIS — N2581 Secondary hyperparathyroidism of renal origin: Secondary | ICD-10-CM | POA: Diagnosis not present

## 2019-07-24 DIAGNOSIS — Z992 Dependence on renal dialysis: Secondary | ICD-10-CM | POA: Diagnosis not present

## 2019-07-25 DIAGNOSIS — N2581 Secondary hyperparathyroidism of renal origin: Secondary | ICD-10-CM | POA: Diagnosis not present

## 2019-07-25 DIAGNOSIS — D631 Anemia in chronic kidney disease: Secondary | ICD-10-CM | POA: Diagnosis not present

## 2019-07-25 DIAGNOSIS — N186 End stage renal disease: Secondary | ICD-10-CM | POA: Diagnosis not present

## 2019-07-26 DIAGNOSIS — N2581 Secondary hyperparathyroidism of renal origin: Secondary | ICD-10-CM | POA: Diagnosis not present

## 2019-07-26 DIAGNOSIS — N186 End stage renal disease: Secondary | ICD-10-CM | POA: Diagnosis not present

## 2019-07-26 DIAGNOSIS — D631 Anemia in chronic kidney disease: Secondary | ICD-10-CM | POA: Diagnosis not present

## 2019-07-27 DIAGNOSIS — N186 End stage renal disease: Secondary | ICD-10-CM | POA: Diagnosis not present

## 2019-07-27 DIAGNOSIS — N2581 Secondary hyperparathyroidism of renal origin: Secondary | ICD-10-CM | POA: Diagnosis not present

## 2019-07-27 DIAGNOSIS — D631 Anemia in chronic kidney disease: Secondary | ICD-10-CM | POA: Diagnosis not present

## 2019-07-28 DIAGNOSIS — N2581 Secondary hyperparathyroidism of renal origin: Secondary | ICD-10-CM | POA: Diagnosis not present

## 2019-07-28 DIAGNOSIS — N186 End stage renal disease: Secondary | ICD-10-CM | POA: Diagnosis not present

## 2019-07-28 DIAGNOSIS — D631 Anemia in chronic kidney disease: Secondary | ICD-10-CM | POA: Diagnosis not present

## 2019-07-29 DIAGNOSIS — D631 Anemia in chronic kidney disease: Secondary | ICD-10-CM | POA: Diagnosis not present

## 2019-07-29 DIAGNOSIS — N2581 Secondary hyperparathyroidism of renal origin: Secondary | ICD-10-CM | POA: Diagnosis not present

## 2019-07-29 DIAGNOSIS — N186 End stage renal disease: Secondary | ICD-10-CM | POA: Diagnosis not present

## 2019-07-30 DIAGNOSIS — N186 End stage renal disease: Secondary | ICD-10-CM | POA: Diagnosis not present

## 2019-07-30 DIAGNOSIS — N2581 Secondary hyperparathyroidism of renal origin: Secondary | ICD-10-CM | POA: Diagnosis not present

## 2019-07-30 DIAGNOSIS — D631 Anemia in chronic kidney disease: Secondary | ICD-10-CM | POA: Diagnosis not present

## 2019-07-31 DIAGNOSIS — E119 Type 2 diabetes mellitus without complications: Secondary | ICD-10-CM | POA: Diagnosis not present

## 2019-07-31 DIAGNOSIS — N2581 Secondary hyperparathyroidism of renal origin: Secondary | ICD-10-CM | POA: Diagnosis not present

## 2019-07-31 DIAGNOSIS — D631 Anemia in chronic kidney disease: Secondary | ICD-10-CM | POA: Diagnosis not present

## 2019-07-31 DIAGNOSIS — N186 End stage renal disease: Secondary | ICD-10-CM | POA: Diagnosis not present

## 2019-08-01 DIAGNOSIS — D631 Anemia in chronic kidney disease: Secondary | ICD-10-CM | POA: Diagnosis not present

## 2019-08-01 DIAGNOSIS — N186 End stage renal disease: Secondary | ICD-10-CM | POA: Diagnosis not present

## 2019-08-01 DIAGNOSIS — N2581 Secondary hyperparathyroidism of renal origin: Secondary | ICD-10-CM | POA: Diagnosis not present

## 2019-08-02 DIAGNOSIS — D631 Anemia in chronic kidney disease: Secondary | ICD-10-CM | POA: Diagnosis not present

## 2019-08-02 DIAGNOSIS — N186 End stage renal disease: Secondary | ICD-10-CM | POA: Diagnosis not present

## 2019-08-02 DIAGNOSIS — N2581 Secondary hyperparathyroidism of renal origin: Secondary | ICD-10-CM | POA: Diagnosis not present

## 2019-08-03 DIAGNOSIS — N186 End stage renal disease: Secondary | ICD-10-CM | POA: Diagnosis not present

## 2019-08-03 DIAGNOSIS — N2581 Secondary hyperparathyroidism of renal origin: Secondary | ICD-10-CM | POA: Diagnosis not present

## 2019-08-03 DIAGNOSIS — D631 Anemia in chronic kidney disease: Secondary | ICD-10-CM | POA: Diagnosis not present

## 2019-08-04 DIAGNOSIS — N2581 Secondary hyperparathyroidism of renal origin: Secondary | ICD-10-CM | POA: Diagnosis not present

## 2019-08-04 DIAGNOSIS — N186 End stage renal disease: Secondary | ICD-10-CM | POA: Diagnosis not present

## 2019-08-04 DIAGNOSIS — D631 Anemia in chronic kidney disease: Secondary | ICD-10-CM | POA: Diagnosis not present

## 2019-08-05 DIAGNOSIS — N2581 Secondary hyperparathyroidism of renal origin: Secondary | ICD-10-CM | POA: Diagnosis not present

## 2019-08-05 DIAGNOSIS — D631 Anemia in chronic kidney disease: Secondary | ICD-10-CM | POA: Diagnosis not present

## 2019-08-05 DIAGNOSIS — N186 End stage renal disease: Secondary | ICD-10-CM | POA: Diagnosis not present

## 2019-08-06 DIAGNOSIS — D631 Anemia in chronic kidney disease: Secondary | ICD-10-CM | POA: Diagnosis not present

## 2019-08-06 DIAGNOSIS — N2581 Secondary hyperparathyroidism of renal origin: Secondary | ICD-10-CM | POA: Diagnosis not present

## 2019-08-06 DIAGNOSIS — N186 End stage renal disease: Secondary | ICD-10-CM | POA: Diagnosis not present

## 2019-08-07 DIAGNOSIS — N186 End stage renal disease: Secondary | ICD-10-CM | POA: Diagnosis not present

## 2019-08-07 DIAGNOSIS — N2581 Secondary hyperparathyroidism of renal origin: Secondary | ICD-10-CM | POA: Diagnosis not present

## 2019-08-07 DIAGNOSIS — D631 Anemia in chronic kidney disease: Secondary | ICD-10-CM | POA: Diagnosis not present

## 2019-08-08 DIAGNOSIS — N186 End stage renal disease: Secondary | ICD-10-CM | POA: Diagnosis not present

## 2019-08-08 DIAGNOSIS — N2581 Secondary hyperparathyroidism of renal origin: Secondary | ICD-10-CM | POA: Diagnosis not present

## 2019-08-08 DIAGNOSIS — D631 Anemia in chronic kidney disease: Secondary | ICD-10-CM | POA: Diagnosis not present

## 2019-08-09 DIAGNOSIS — D631 Anemia in chronic kidney disease: Secondary | ICD-10-CM | POA: Diagnosis not present

## 2019-08-09 DIAGNOSIS — N186 End stage renal disease: Secondary | ICD-10-CM | POA: Diagnosis not present

## 2019-08-09 DIAGNOSIS — N2581 Secondary hyperparathyroidism of renal origin: Secondary | ICD-10-CM | POA: Diagnosis not present

## 2019-08-10 DIAGNOSIS — N2581 Secondary hyperparathyroidism of renal origin: Secondary | ICD-10-CM | POA: Diagnosis not present

## 2019-08-10 DIAGNOSIS — N186 End stage renal disease: Secondary | ICD-10-CM | POA: Diagnosis not present

## 2019-08-10 DIAGNOSIS — D631 Anemia in chronic kidney disease: Secondary | ICD-10-CM | POA: Diagnosis not present

## 2019-08-11 DIAGNOSIS — N2581 Secondary hyperparathyroidism of renal origin: Secondary | ICD-10-CM | POA: Diagnosis not present

## 2019-08-11 DIAGNOSIS — N186 End stage renal disease: Secondary | ICD-10-CM | POA: Diagnosis not present

## 2019-08-11 DIAGNOSIS — D631 Anemia in chronic kidney disease: Secondary | ICD-10-CM | POA: Diagnosis not present

## 2019-08-12 DIAGNOSIS — D631 Anemia in chronic kidney disease: Secondary | ICD-10-CM | POA: Diagnosis not present

## 2019-08-12 DIAGNOSIS — N2581 Secondary hyperparathyroidism of renal origin: Secondary | ICD-10-CM | POA: Diagnosis not present

## 2019-08-12 DIAGNOSIS — N186 End stage renal disease: Secondary | ICD-10-CM | POA: Diagnosis not present

## 2019-08-13 DIAGNOSIS — N186 End stage renal disease: Secondary | ICD-10-CM | POA: Diagnosis not present

## 2019-08-13 DIAGNOSIS — N2581 Secondary hyperparathyroidism of renal origin: Secondary | ICD-10-CM | POA: Diagnosis not present

## 2019-08-13 DIAGNOSIS — D631 Anemia in chronic kidney disease: Secondary | ICD-10-CM | POA: Diagnosis not present

## 2019-08-14 DIAGNOSIS — N186 End stage renal disease: Secondary | ICD-10-CM | POA: Diagnosis not present

## 2019-08-14 DIAGNOSIS — D631 Anemia in chronic kidney disease: Secondary | ICD-10-CM | POA: Diagnosis not present

## 2019-08-14 DIAGNOSIS — N2581 Secondary hyperparathyroidism of renal origin: Secondary | ICD-10-CM | POA: Diagnosis not present

## 2019-08-15 DIAGNOSIS — D631 Anemia in chronic kidney disease: Secondary | ICD-10-CM | POA: Diagnosis not present

## 2019-08-15 DIAGNOSIS — N186 End stage renal disease: Secondary | ICD-10-CM | POA: Diagnosis not present

## 2019-08-15 DIAGNOSIS — N2581 Secondary hyperparathyroidism of renal origin: Secondary | ICD-10-CM | POA: Diagnosis not present

## 2019-08-16 DIAGNOSIS — N2581 Secondary hyperparathyroidism of renal origin: Secondary | ICD-10-CM | POA: Diagnosis not present

## 2019-08-16 DIAGNOSIS — N186 End stage renal disease: Secondary | ICD-10-CM | POA: Diagnosis not present

## 2019-08-16 DIAGNOSIS — D631 Anemia in chronic kidney disease: Secondary | ICD-10-CM | POA: Diagnosis not present

## 2019-08-17 DIAGNOSIS — N186 End stage renal disease: Secondary | ICD-10-CM | POA: Diagnosis not present

## 2019-08-17 DIAGNOSIS — D631 Anemia in chronic kidney disease: Secondary | ICD-10-CM | POA: Diagnosis not present

## 2019-08-17 DIAGNOSIS — N2581 Secondary hyperparathyroidism of renal origin: Secondary | ICD-10-CM | POA: Diagnosis not present

## 2019-08-18 DIAGNOSIS — D631 Anemia in chronic kidney disease: Secondary | ICD-10-CM | POA: Diagnosis not present

## 2019-08-18 DIAGNOSIS — N186 End stage renal disease: Secondary | ICD-10-CM | POA: Diagnosis not present

## 2019-08-18 DIAGNOSIS — N2581 Secondary hyperparathyroidism of renal origin: Secondary | ICD-10-CM | POA: Diagnosis not present

## 2019-08-19 DIAGNOSIS — N2581 Secondary hyperparathyroidism of renal origin: Secondary | ICD-10-CM | POA: Diagnosis not present

## 2019-08-19 DIAGNOSIS — N186 End stage renal disease: Secondary | ICD-10-CM | POA: Diagnosis not present

## 2019-08-19 DIAGNOSIS — D631 Anemia in chronic kidney disease: Secondary | ICD-10-CM | POA: Diagnosis not present

## 2019-08-20 DIAGNOSIS — N186 End stage renal disease: Secondary | ICD-10-CM | POA: Diagnosis not present

## 2019-08-20 DIAGNOSIS — N2581 Secondary hyperparathyroidism of renal origin: Secondary | ICD-10-CM | POA: Diagnosis not present

## 2019-08-20 DIAGNOSIS — D631 Anemia in chronic kidney disease: Secondary | ICD-10-CM | POA: Diagnosis not present

## 2019-08-21 DIAGNOSIS — D631 Anemia in chronic kidney disease: Secondary | ICD-10-CM | POA: Diagnosis not present

## 2019-08-21 DIAGNOSIS — N2581 Secondary hyperparathyroidism of renal origin: Secondary | ICD-10-CM | POA: Diagnosis not present

## 2019-08-21 DIAGNOSIS — N186 End stage renal disease: Secondary | ICD-10-CM | POA: Diagnosis not present

## 2019-08-22 DIAGNOSIS — N2581 Secondary hyperparathyroidism of renal origin: Secondary | ICD-10-CM | POA: Diagnosis not present

## 2019-08-22 DIAGNOSIS — D631 Anemia in chronic kidney disease: Secondary | ICD-10-CM | POA: Diagnosis not present

## 2019-08-22 DIAGNOSIS — N186 End stage renal disease: Secondary | ICD-10-CM | POA: Diagnosis not present

## 2019-08-23 DIAGNOSIS — N2581 Secondary hyperparathyroidism of renal origin: Secondary | ICD-10-CM | POA: Diagnosis not present

## 2019-08-23 DIAGNOSIS — D631 Anemia in chronic kidney disease: Secondary | ICD-10-CM | POA: Diagnosis not present

## 2019-08-23 DIAGNOSIS — N186 End stage renal disease: Secondary | ICD-10-CM | POA: Diagnosis not present

## 2019-08-24 DIAGNOSIS — D631 Anemia in chronic kidney disease: Secondary | ICD-10-CM | POA: Diagnosis not present

## 2019-08-24 DIAGNOSIS — N2581 Secondary hyperparathyroidism of renal origin: Secondary | ICD-10-CM | POA: Diagnosis not present

## 2019-08-24 DIAGNOSIS — Z992 Dependence on renal dialysis: Secondary | ICD-10-CM | POA: Diagnosis not present

## 2019-08-24 DIAGNOSIS — N186 End stage renal disease: Secondary | ICD-10-CM | POA: Diagnosis not present

## 2019-08-25 DIAGNOSIS — N2581 Secondary hyperparathyroidism of renal origin: Secondary | ICD-10-CM | POA: Diagnosis not present

## 2019-08-25 DIAGNOSIS — N186 End stage renal disease: Secondary | ICD-10-CM | POA: Diagnosis not present

## 2019-08-25 DIAGNOSIS — D631 Anemia in chronic kidney disease: Secondary | ICD-10-CM | POA: Diagnosis not present

## 2019-08-26 DIAGNOSIS — N186 End stage renal disease: Secondary | ICD-10-CM | POA: Diagnosis not present

## 2019-08-26 DIAGNOSIS — D631 Anemia in chronic kidney disease: Secondary | ICD-10-CM | POA: Diagnosis not present

## 2019-08-26 DIAGNOSIS — N2581 Secondary hyperparathyroidism of renal origin: Secondary | ICD-10-CM | POA: Diagnosis not present

## 2019-08-27 DIAGNOSIS — N2581 Secondary hyperparathyroidism of renal origin: Secondary | ICD-10-CM | POA: Diagnosis not present

## 2019-08-27 DIAGNOSIS — D631 Anemia in chronic kidney disease: Secondary | ICD-10-CM | POA: Diagnosis not present

## 2019-08-27 DIAGNOSIS — N186 End stage renal disease: Secondary | ICD-10-CM | POA: Diagnosis not present

## 2019-08-28 DIAGNOSIS — N186 End stage renal disease: Secondary | ICD-10-CM | POA: Diagnosis not present

## 2019-08-28 DIAGNOSIS — E119 Type 2 diabetes mellitus without complications: Secondary | ICD-10-CM | POA: Diagnosis not present

## 2019-08-28 DIAGNOSIS — D631 Anemia in chronic kidney disease: Secondary | ICD-10-CM | POA: Diagnosis not present

## 2019-08-28 DIAGNOSIS — N2581 Secondary hyperparathyroidism of renal origin: Secondary | ICD-10-CM | POA: Diagnosis not present

## 2019-08-28 DIAGNOSIS — Z114 Encounter for screening for human immunodeficiency virus [HIV]: Secondary | ICD-10-CM | POA: Diagnosis not present

## 2019-08-29 DIAGNOSIS — D631 Anemia in chronic kidney disease: Secondary | ICD-10-CM | POA: Diagnosis not present

## 2019-08-29 DIAGNOSIS — N2581 Secondary hyperparathyroidism of renal origin: Secondary | ICD-10-CM | POA: Diagnosis not present

## 2019-08-29 DIAGNOSIS — N186 End stage renal disease: Secondary | ICD-10-CM | POA: Diagnosis not present

## 2019-08-30 DIAGNOSIS — N186 End stage renal disease: Secondary | ICD-10-CM | POA: Diagnosis not present

## 2019-08-30 DIAGNOSIS — N2581 Secondary hyperparathyroidism of renal origin: Secondary | ICD-10-CM | POA: Diagnosis not present

## 2019-08-30 DIAGNOSIS — D631 Anemia in chronic kidney disease: Secondary | ICD-10-CM | POA: Diagnosis not present

## 2019-08-31 DIAGNOSIS — D631 Anemia in chronic kidney disease: Secondary | ICD-10-CM | POA: Diagnosis not present

## 2019-08-31 DIAGNOSIS — N186 End stage renal disease: Secondary | ICD-10-CM | POA: Diagnosis not present

## 2019-08-31 DIAGNOSIS — N2581 Secondary hyperparathyroidism of renal origin: Secondary | ICD-10-CM | POA: Diagnosis not present

## 2019-09-01 DIAGNOSIS — N2581 Secondary hyperparathyroidism of renal origin: Secondary | ICD-10-CM | POA: Diagnosis not present

## 2019-09-01 DIAGNOSIS — N186 End stage renal disease: Secondary | ICD-10-CM | POA: Diagnosis not present

## 2019-09-01 DIAGNOSIS — D631 Anemia in chronic kidney disease: Secondary | ICD-10-CM | POA: Diagnosis not present

## 2019-09-02 DIAGNOSIS — N186 End stage renal disease: Secondary | ICD-10-CM | POA: Diagnosis not present

## 2019-09-02 DIAGNOSIS — D631 Anemia in chronic kidney disease: Secondary | ICD-10-CM | POA: Diagnosis not present

## 2019-09-02 DIAGNOSIS — N2581 Secondary hyperparathyroidism of renal origin: Secondary | ICD-10-CM | POA: Diagnosis not present

## 2019-09-03 DIAGNOSIS — D631 Anemia in chronic kidney disease: Secondary | ICD-10-CM | POA: Diagnosis not present

## 2019-09-03 DIAGNOSIS — N2581 Secondary hyperparathyroidism of renal origin: Secondary | ICD-10-CM | POA: Diagnosis not present

## 2019-09-03 DIAGNOSIS — N186 End stage renal disease: Secondary | ICD-10-CM | POA: Diagnosis not present

## 2019-09-04 DIAGNOSIS — D631 Anemia in chronic kidney disease: Secondary | ICD-10-CM | POA: Diagnosis not present

## 2019-09-04 DIAGNOSIS — N2581 Secondary hyperparathyroidism of renal origin: Secondary | ICD-10-CM | POA: Diagnosis not present

## 2019-09-04 DIAGNOSIS — N186 End stage renal disease: Secondary | ICD-10-CM | POA: Diagnosis not present

## 2019-09-05 DIAGNOSIS — N2581 Secondary hyperparathyroidism of renal origin: Secondary | ICD-10-CM | POA: Diagnosis not present

## 2019-09-05 DIAGNOSIS — D631 Anemia in chronic kidney disease: Secondary | ICD-10-CM | POA: Diagnosis not present

## 2019-09-05 DIAGNOSIS — N186 End stage renal disease: Secondary | ICD-10-CM | POA: Diagnosis not present

## 2019-09-06 DIAGNOSIS — N186 End stage renal disease: Secondary | ICD-10-CM | POA: Diagnosis not present

## 2019-09-06 DIAGNOSIS — N2581 Secondary hyperparathyroidism of renal origin: Secondary | ICD-10-CM | POA: Diagnosis not present

## 2019-09-06 DIAGNOSIS — D631 Anemia in chronic kidney disease: Secondary | ICD-10-CM | POA: Diagnosis not present

## 2019-09-07 DIAGNOSIS — N2581 Secondary hyperparathyroidism of renal origin: Secondary | ICD-10-CM | POA: Diagnosis not present

## 2019-09-07 DIAGNOSIS — D631 Anemia in chronic kidney disease: Secondary | ICD-10-CM | POA: Diagnosis not present

## 2019-09-07 DIAGNOSIS — N186 End stage renal disease: Secondary | ICD-10-CM | POA: Diagnosis not present

## 2019-09-08 DIAGNOSIS — D631 Anemia in chronic kidney disease: Secondary | ICD-10-CM | POA: Diagnosis not present

## 2019-09-08 DIAGNOSIS — N2581 Secondary hyperparathyroidism of renal origin: Secondary | ICD-10-CM | POA: Diagnosis not present

## 2019-09-08 DIAGNOSIS — N186 End stage renal disease: Secondary | ICD-10-CM | POA: Diagnosis not present

## 2019-09-09 DIAGNOSIS — D631 Anemia in chronic kidney disease: Secondary | ICD-10-CM | POA: Diagnosis not present

## 2019-09-09 DIAGNOSIS — N2581 Secondary hyperparathyroidism of renal origin: Secondary | ICD-10-CM | POA: Diagnosis not present

## 2019-09-09 DIAGNOSIS — N186 End stage renal disease: Secondary | ICD-10-CM | POA: Diagnosis not present

## 2019-09-10 DIAGNOSIS — D631 Anemia in chronic kidney disease: Secondary | ICD-10-CM | POA: Diagnosis not present

## 2019-09-10 DIAGNOSIS — N2581 Secondary hyperparathyroidism of renal origin: Secondary | ICD-10-CM | POA: Diagnosis not present

## 2019-09-10 DIAGNOSIS — N186 End stage renal disease: Secondary | ICD-10-CM | POA: Diagnosis not present

## 2019-09-11 DIAGNOSIS — N186 End stage renal disease: Secondary | ICD-10-CM | POA: Diagnosis not present

## 2019-09-11 DIAGNOSIS — N2581 Secondary hyperparathyroidism of renal origin: Secondary | ICD-10-CM | POA: Diagnosis not present

## 2019-09-11 DIAGNOSIS — D631 Anemia in chronic kidney disease: Secondary | ICD-10-CM | POA: Diagnosis not present

## 2019-09-12 DIAGNOSIS — D631 Anemia in chronic kidney disease: Secondary | ICD-10-CM | POA: Diagnosis not present

## 2019-09-12 DIAGNOSIS — N186 End stage renal disease: Secondary | ICD-10-CM | POA: Diagnosis not present

## 2019-09-12 DIAGNOSIS — N2581 Secondary hyperparathyroidism of renal origin: Secondary | ICD-10-CM | POA: Diagnosis not present

## 2019-09-13 DIAGNOSIS — D631 Anemia in chronic kidney disease: Secondary | ICD-10-CM | POA: Diagnosis not present

## 2019-09-13 DIAGNOSIS — N2581 Secondary hyperparathyroidism of renal origin: Secondary | ICD-10-CM | POA: Diagnosis not present

## 2019-09-13 DIAGNOSIS — N186 End stage renal disease: Secondary | ICD-10-CM | POA: Diagnosis not present

## 2019-09-14 DIAGNOSIS — N2581 Secondary hyperparathyroidism of renal origin: Secondary | ICD-10-CM | POA: Diagnosis not present

## 2019-09-14 DIAGNOSIS — N186 End stage renal disease: Secondary | ICD-10-CM | POA: Diagnosis not present

## 2019-09-14 DIAGNOSIS — D631 Anemia in chronic kidney disease: Secondary | ICD-10-CM | POA: Diagnosis not present

## 2019-09-15 DIAGNOSIS — N2581 Secondary hyperparathyroidism of renal origin: Secondary | ICD-10-CM | POA: Diagnosis not present

## 2019-09-15 DIAGNOSIS — D631 Anemia in chronic kidney disease: Secondary | ICD-10-CM | POA: Diagnosis not present

## 2019-09-15 DIAGNOSIS — N186 End stage renal disease: Secondary | ICD-10-CM | POA: Diagnosis not present

## 2019-09-16 DIAGNOSIS — N186 End stage renal disease: Secondary | ICD-10-CM | POA: Diagnosis not present

## 2019-09-16 DIAGNOSIS — D631 Anemia in chronic kidney disease: Secondary | ICD-10-CM | POA: Diagnosis not present

## 2019-09-16 DIAGNOSIS — N2581 Secondary hyperparathyroidism of renal origin: Secondary | ICD-10-CM | POA: Diagnosis not present

## 2019-09-17 DIAGNOSIS — D631 Anemia in chronic kidney disease: Secondary | ICD-10-CM | POA: Diagnosis not present

## 2019-09-17 DIAGNOSIS — N2581 Secondary hyperparathyroidism of renal origin: Secondary | ICD-10-CM | POA: Diagnosis not present

## 2019-09-17 DIAGNOSIS — N186 End stage renal disease: Secondary | ICD-10-CM | POA: Diagnosis not present

## 2019-09-18 DIAGNOSIS — D631 Anemia in chronic kidney disease: Secondary | ICD-10-CM | POA: Diagnosis not present

## 2019-09-18 DIAGNOSIS — N186 End stage renal disease: Secondary | ICD-10-CM | POA: Diagnosis not present

## 2019-09-18 DIAGNOSIS — N2581 Secondary hyperparathyroidism of renal origin: Secondary | ICD-10-CM | POA: Diagnosis not present

## 2019-09-19 DIAGNOSIS — N186 End stage renal disease: Secondary | ICD-10-CM | POA: Diagnosis not present

## 2019-09-19 DIAGNOSIS — N2581 Secondary hyperparathyroidism of renal origin: Secondary | ICD-10-CM | POA: Diagnosis not present

## 2019-09-19 DIAGNOSIS — D631 Anemia in chronic kidney disease: Secondary | ICD-10-CM | POA: Diagnosis not present

## 2019-09-20 DIAGNOSIS — D631 Anemia in chronic kidney disease: Secondary | ICD-10-CM | POA: Diagnosis not present

## 2019-09-20 DIAGNOSIS — N2581 Secondary hyperparathyroidism of renal origin: Secondary | ICD-10-CM | POA: Diagnosis not present

## 2019-09-20 DIAGNOSIS — N186 End stage renal disease: Secondary | ICD-10-CM | POA: Diagnosis not present

## 2019-09-21 DIAGNOSIS — D631 Anemia in chronic kidney disease: Secondary | ICD-10-CM | POA: Diagnosis not present

## 2019-09-21 DIAGNOSIS — N2581 Secondary hyperparathyroidism of renal origin: Secondary | ICD-10-CM | POA: Diagnosis not present

## 2019-09-21 DIAGNOSIS — N186 End stage renal disease: Secondary | ICD-10-CM | POA: Diagnosis not present

## 2019-09-22 DIAGNOSIS — D631 Anemia in chronic kidney disease: Secondary | ICD-10-CM | POA: Diagnosis not present

## 2019-09-22 DIAGNOSIS — N186 End stage renal disease: Secondary | ICD-10-CM | POA: Diagnosis not present

## 2019-09-22 DIAGNOSIS — N2581 Secondary hyperparathyroidism of renal origin: Secondary | ICD-10-CM | POA: Diagnosis not present

## 2019-09-23 DIAGNOSIS — D631 Anemia in chronic kidney disease: Secondary | ICD-10-CM | POA: Diagnosis not present

## 2019-09-23 DIAGNOSIS — Z992 Dependence on renal dialysis: Secondary | ICD-10-CM | POA: Diagnosis not present

## 2019-09-23 DIAGNOSIS — N186 End stage renal disease: Secondary | ICD-10-CM | POA: Diagnosis not present

## 2019-09-23 DIAGNOSIS — N2581 Secondary hyperparathyroidism of renal origin: Secondary | ICD-10-CM | POA: Diagnosis not present

## 2019-09-24 DIAGNOSIS — N2581 Secondary hyperparathyroidism of renal origin: Secondary | ICD-10-CM | POA: Diagnosis not present

## 2019-09-24 DIAGNOSIS — D631 Anemia in chronic kidney disease: Secondary | ICD-10-CM | POA: Diagnosis not present

## 2019-09-24 DIAGNOSIS — N186 End stage renal disease: Secondary | ICD-10-CM | POA: Diagnosis not present

## 2019-09-25 DIAGNOSIS — E119 Type 2 diabetes mellitus without complications: Secondary | ICD-10-CM | POA: Diagnosis not present

## 2019-09-25 DIAGNOSIS — N186 End stage renal disease: Secondary | ICD-10-CM | POA: Diagnosis not present

## 2019-09-25 DIAGNOSIS — D631 Anemia in chronic kidney disease: Secondary | ICD-10-CM | POA: Diagnosis not present

## 2019-09-25 DIAGNOSIS — Z4932 Encounter for adequacy testing for peritoneal dialysis: Secondary | ICD-10-CM | POA: Diagnosis not present

## 2019-09-25 DIAGNOSIS — N2581 Secondary hyperparathyroidism of renal origin: Secondary | ICD-10-CM | POA: Diagnosis not present

## 2019-09-26 DIAGNOSIS — N2581 Secondary hyperparathyroidism of renal origin: Secondary | ICD-10-CM | POA: Diagnosis not present

## 2019-09-26 DIAGNOSIS — D631 Anemia in chronic kidney disease: Secondary | ICD-10-CM | POA: Diagnosis not present

## 2019-09-26 DIAGNOSIS — N186 End stage renal disease: Secondary | ICD-10-CM | POA: Diagnosis not present

## 2019-09-27 DIAGNOSIS — N186 End stage renal disease: Secondary | ICD-10-CM | POA: Diagnosis not present

## 2019-09-27 DIAGNOSIS — N2581 Secondary hyperparathyroidism of renal origin: Secondary | ICD-10-CM | POA: Diagnosis not present

## 2019-09-27 DIAGNOSIS — D631 Anemia in chronic kidney disease: Secondary | ICD-10-CM | POA: Diagnosis not present

## 2019-09-28 DIAGNOSIS — N186 End stage renal disease: Secondary | ICD-10-CM | POA: Diagnosis not present

## 2019-09-28 DIAGNOSIS — N2581 Secondary hyperparathyroidism of renal origin: Secondary | ICD-10-CM | POA: Diagnosis not present

## 2019-09-28 DIAGNOSIS — D631 Anemia in chronic kidney disease: Secondary | ICD-10-CM | POA: Diagnosis not present

## 2019-09-29 DIAGNOSIS — N2581 Secondary hyperparathyroidism of renal origin: Secondary | ICD-10-CM | POA: Diagnosis not present

## 2019-09-29 DIAGNOSIS — N186 End stage renal disease: Secondary | ICD-10-CM | POA: Diagnosis not present

## 2019-09-29 DIAGNOSIS — D631 Anemia in chronic kidney disease: Secondary | ICD-10-CM | POA: Diagnosis not present

## 2019-09-30 DIAGNOSIS — N186 End stage renal disease: Secondary | ICD-10-CM | POA: Diagnosis not present

## 2019-09-30 DIAGNOSIS — N2581 Secondary hyperparathyroidism of renal origin: Secondary | ICD-10-CM | POA: Diagnosis not present

## 2019-09-30 DIAGNOSIS — D631 Anemia in chronic kidney disease: Secondary | ICD-10-CM | POA: Diagnosis not present

## 2019-10-01 DIAGNOSIS — N2581 Secondary hyperparathyroidism of renal origin: Secondary | ICD-10-CM | POA: Diagnosis not present

## 2019-10-01 DIAGNOSIS — D631 Anemia in chronic kidney disease: Secondary | ICD-10-CM | POA: Diagnosis not present

## 2019-10-01 DIAGNOSIS — N186 End stage renal disease: Secondary | ICD-10-CM | POA: Diagnosis not present

## 2019-10-02 DIAGNOSIS — N186 End stage renal disease: Secondary | ICD-10-CM | POA: Diagnosis not present

## 2019-10-02 DIAGNOSIS — D631 Anemia in chronic kidney disease: Secondary | ICD-10-CM | POA: Diagnosis not present

## 2019-10-02 DIAGNOSIS — N2581 Secondary hyperparathyroidism of renal origin: Secondary | ICD-10-CM | POA: Diagnosis not present

## 2019-10-03 DIAGNOSIS — N2581 Secondary hyperparathyroidism of renal origin: Secondary | ICD-10-CM | POA: Diagnosis not present

## 2019-10-03 DIAGNOSIS — D631 Anemia in chronic kidney disease: Secondary | ICD-10-CM | POA: Diagnosis not present

## 2019-10-03 DIAGNOSIS — N186 End stage renal disease: Secondary | ICD-10-CM | POA: Diagnosis not present

## 2019-10-04 DIAGNOSIS — N186 End stage renal disease: Secondary | ICD-10-CM | POA: Diagnosis not present

## 2019-10-04 DIAGNOSIS — N2581 Secondary hyperparathyroidism of renal origin: Secondary | ICD-10-CM | POA: Diagnosis not present

## 2019-10-04 DIAGNOSIS — D631 Anemia in chronic kidney disease: Secondary | ICD-10-CM | POA: Diagnosis not present

## 2019-10-05 DIAGNOSIS — N186 End stage renal disease: Secondary | ICD-10-CM | POA: Diagnosis not present

## 2019-10-05 DIAGNOSIS — D631 Anemia in chronic kidney disease: Secondary | ICD-10-CM | POA: Diagnosis not present

## 2019-10-05 DIAGNOSIS — N2581 Secondary hyperparathyroidism of renal origin: Secondary | ICD-10-CM | POA: Diagnosis not present

## 2019-10-06 DIAGNOSIS — D631 Anemia in chronic kidney disease: Secondary | ICD-10-CM | POA: Diagnosis not present

## 2019-10-06 DIAGNOSIS — N2581 Secondary hyperparathyroidism of renal origin: Secondary | ICD-10-CM | POA: Diagnosis not present

## 2019-10-06 DIAGNOSIS — N186 End stage renal disease: Secondary | ICD-10-CM | POA: Diagnosis not present

## 2019-10-07 DIAGNOSIS — D631 Anemia in chronic kidney disease: Secondary | ICD-10-CM | POA: Diagnosis not present

## 2019-10-07 DIAGNOSIS — N186 End stage renal disease: Secondary | ICD-10-CM | POA: Diagnosis not present

## 2019-10-07 DIAGNOSIS — N2581 Secondary hyperparathyroidism of renal origin: Secondary | ICD-10-CM | POA: Diagnosis not present

## 2019-10-08 DIAGNOSIS — D631 Anemia in chronic kidney disease: Secondary | ICD-10-CM | POA: Diagnosis not present

## 2019-10-08 DIAGNOSIS — N186 End stage renal disease: Secondary | ICD-10-CM | POA: Diagnosis not present

## 2019-10-08 DIAGNOSIS — N2581 Secondary hyperparathyroidism of renal origin: Secondary | ICD-10-CM | POA: Diagnosis not present

## 2019-10-09 DIAGNOSIS — N2581 Secondary hyperparathyroidism of renal origin: Secondary | ICD-10-CM | POA: Diagnosis not present

## 2019-10-09 DIAGNOSIS — D631 Anemia in chronic kidney disease: Secondary | ICD-10-CM | POA: Diagnosis not present

## 2019-10-09 DIAGNOSIS — N186 End stage renal disease: Secondary | ICD-10-CM | POA: Diagnosis not present

## 2019-10-10 DIAGNOSIS — N186 End stage renal disease: Secondary | ICD-10-CM | POA: Diagnosis not present

## 2019-10-10 DIAGNOSIS — D631 Anemia in chronic kidney disease: Secondary | ICD-10-CM | POA: Diagnosis not present

## 2019-10-10 DIAGNOSIS — N2581 Secondary hyperparathyroidism of renal origin: Secondary | ICD-10-CM | POA: Diagnosis not present

## 2019-10-11 DIAGNOSIS — D631 Anemia in chronic kidney disease: Secondary | ICD-10-CM | POA: Diagnosis not present

## 2019-10-11 DIAGNOSIS — N2581 Secondary hyperparathyroidism of renal origin: Secondary | ICD-10-CM | POA: Diagnosis not present

## 2019-10-11 DIAGNOSIS — N186 End stage renal disease: Secondary | ICD-10-CM | POA: Diagnosis not present

## 2019-10-12 DIAGNOSIS — D631 Anemia in chronic kidney disease: Secondary | ICD-10-CM | POA: Diagnosis not present

## 2019-10-12 DIAGNOSIS — N2581 Secondary hyperparathyroidism of renal origin: Secondary | ICD-10-CM | POA: Diagnosis not present

## 2019-10-12 DIAGNOSIS — N186 End stage renal disease: Secondary | ICD-10-CM | POA: Diagnosis not present

## 2019-10-13 DIAGNOSIS — D631 Anemia in chronic kidney disease: Secondary | ICD-10-CM | POA: Diagnosis not present

## 2019-10-13 DIAGNOSIS — N186 End stage renal disease: Secondary | ICD-10-CM | POA: Diagnosis not present

## 2019-10-13 DIAGNOSIS — N2581 Secondary hyperparathyroidism of renal origin: Secondary | ICD-10-CM | POA: Diagnosis not present

## 2019-10-14 DIAGNOSIS — N2581 Secondary hyperparathyroidism of renal origin: Secondary | ICD-10-CM | POA: Diagnosis not present

## 2019-10-14 DIAGNOSIS — N186 End stage renal disease: Secondary | ICD-10-CM | POA: Diagnosis not present

## 2019-10-14 DIAGNOSIS — D631 Anemia in chronic kidney disease: Secondary | ICD-10-CM | POA: Diagnosis not present

## 2019-10-15 DIAGNOSIS — D631 Anemia in chronic kidney disease: Secondary | ICD-10-CM | POA: Diagnosis not present

## 2019-10-15 DIAGNOSIS — N186 End stage renal disease: Secondary | ICD-10-CM | POA: Diagnosis not present

## 2019-10-15 DIAGNOSIS — N2581 Secondary hyperparathyroidism of renal origin: Secondary | ICD-10-CM | POA: Diagnosis not present

## 2019-10-15 DIAGNOSIS — R9439 Abnormal result of other cardiovascular function study: Secondary | ICD-10-CM | POA: Insufficient documentation

## 2019-10-16 DIAGNOSIS — D631 Anemia in chronic kidney disease: Secondary | ICD-10-CM | POA: Diagnosis not present

## 2019-10-16 DIAGNOSIS — N2581 Secondary hyperparathyroidism of renal origin: Secondary | ICD-10-CM | POA: Diagnosis not present

## 2019-10-16 DIAGNOSIS — N186 End stage renal disease: Secondary | ICD-10-CM | POA: Diagnosis not present

## 2019-10-17 DIAGNOSIS — D631 Anemia in chronic kidney disease: Secondary | ICD-10-CM | POA: Diagnosis not present

## 2019-10-17 DIAGNOSIS — N2581 Secondary hyperparathyroidism of renal origin: Secondary | ICD-10-CM | POA: Diagnosis not present

## 2019-10-17 DIAGNOSIS — N186 End stage renal disease: Secondary | ICD-10-CM | POA: Diagnosis not present

## 2019-10-18 DIAGNOSIS — D631 Anemia in chronic kidney disease: Secondary | ICD-10-CM | POA: Diagnosis not present

## 2019-10-18 DIAGNOSIS — N2581 Secondary hyperparathyroidism of renal origin: Secondary | ICD-10-CM | POA: Diagnosis not present

## 2019-10-18 DIAGNOSIS — N186 End stage renal disease: Secondary | ICD-10-CM | POA: Diagnosis not present

## 2019-10-19 DIAGNOSIS — D631 Anemia in chronic kidney disease: Secondary | ICD-10-CM | POA: Diagnosis not present

## 2019-10-19 DIAGNOSIS — N186 End stage renal disease: Secondary | ICD-10-CM | POA: Diagnosis not present

## 2019-10-19 DIAGNOSIS — N2581 Secondary hyperparathyroidism of renal origin: Secondary | ICD-10-CM | POA: Diagnosis not present

## 2019-10-20 DIAGNOSIS — N2581 Secondary hyperparathyroidism of renal origin: Secondary | ICD-10-CM | POA: Diagnosis not present

## 2019-10-20 DIAGNOSIS — N186 End stage renal disease: Secondary | ICD-10-CM | POA: Diagnosis not present

## 2019-10-20 DIAGNOSIS — D631 Anemia in chronic kidney disease: Secondary | ICD-10-CM | POA: Diagnosis not present

## 2019-10-21 DIAGNOSIS — N186 End stage renal disease: Secondary | ICD-10-CM | POA: Diagnosis not present

## 2019-10-21 DIAGNOSIS — D631 Anemia in chronic kidney disease: Secondary | ICD-10-CM | POA: Diagnosis not present

## 2019-10-21 DIAGNOSIS — N2581 Secondary hyperparathyroidism of renal origin: Secondary | ICD-10-CM | POA: Diagnosis not present

## 2019-10-22 DIAGNOSIS — D631 Anemia in chronic kidney disease: Secondary | ICD-10-CM | POA: Diagnosis not present

## 2019-10-22 DIAGNOSIS — N186 End stage renal disease: Secondary | ICD-10-CM | POA: Diagnosis not present

## 2019-10-22 DIAGNOSIS — N2581 Secondary hyperparathyroidism of renal origin: Secondary | ICD-10-CM | POA: Diagnosis not present

## 2019-10-23 DIAGNOSIS — N186 End stage renal disease: Secondary | ICD-10-CM | POA: Diagnosis not present

## 2019-10-23 DIAGNOSIS — D631 Anemia in chronic kidney disease: Secondary | ICD-10-CM | POA: Diagnosis not present

## 2019-10-23 DIAGNOSIS — N2581 Secondary hyperparathyroidism of renal origin: Secondary | ICD-10-CM | POA: Diagnosis not present

## 2019-10-24 DIAGNOSIS — N2581 Secondary hyperparathyroidism of renal origin: Secondary | ICD-10-CM | POA: Diagnosis not present

## 2019-10-24 DIAGNOSIS — D631 Anemia in chronic kidney disease: Secondary | ICD-10-CM | POA: Diagnosis not present

## 2019-10-24 DIAGNOSIS — N186 End stage renal disease: Secondary | ICD-10-CM | POA: Diagnosis not present

## 2019-10-25 DIAGNOSIS — N186 End stage renal disease: Secondary | ICD-10-CM | POA: Diagnosis not present

## 2019-10-25 DIAGNOSIS — D631 Anemia in chronic kidney disease: Secondary | ICD-10-CM | POA: Diagnosis not present

## 2019-10-25 DIAGNOSIS — N2581 Secondary hyperparathyroidism of renal origin: Secondary | ICD-10-CM | POA: Diagnosis not present

## 2019-10-26 DIAGNOSIS — D631 Anemia in chronic kidney disease: Secondary | ICD-10-CM | POA: Diagnosis not present

## 2019-10-26 DIAGNOSIS — N2581 Secondary hyperparathyroidism of renal origin: Secondary | ICD-10-CM | POA: Diagnosis not present

## 2019-10-26 DIAGNOSIS — N186 End stage renal disease: Secondary | ICD-10-CM | POA: Diagnosis not present

## 2019-10-26 DIAGNOSIS — E119 Type 2 diabetes mellitus without complications: Secondary | ICD-10-CM | POA: Diagnosis not present

## 2019-10-27 DIAGNOSIS — D631 Anemia in chronic kidney disease: Secondary | ICD-10-CM | POA: Diagnosis not present

## 2019-10-27 DIAGNOSIS — N2581 Secondary hyperparathyroidism of renal origin: Secondary | ICD-10-CM | POA: Diagnosis not present

## 2019-10-27 DIAGNOSIS — N186 End stage renal disease: Secondary | ICD-10-CM | POA: Diagnosis not present

## 2019-10-28 DIAGNOSIS — N186 End stage renal disease: Secondary | ICD-10-CM | POA: Diagnosis not present

## 2019-10-28 DIAGNOSIS — D631 Anemia in chronic kidney disease: Secondary | ICD-10-CM | POA: Diagnosis not present

## 2019-10-28 DIAGNOSIS — N2581 Secondary hyperparathyroidism of renal origin: Secondary | ICD-10-CM | POA: Diagnosis not present

## 2019-10-29 DIAGNOSIS — N2581 Secondary hyperparathyroidism of renal origin: Secondary | ICD-10-CM | POA: Diagnosis not present

## 2019-10-29 DIAGNOSIS — D631 Anemia in chronic kidney disease: Secondary | ICD-10-CM | POA: Diagnosis not present

## 2019-10-29 DIAGNOSIS — N186 End stage renal disease: Secondary | ICD-10-CM | POA: Diagnosis not present

## 2019-10-30 DIAGNOSIS — D631 Anemia in chronic kidney disease: Secondary | ICD-10-CM | POA: Diagnosis not present

## 2019-10-30 DIAGNOSIS — N186 End stage renal disease: Secondary | ICD-10-CM | POA: Diagnosis not present

## 2019-10-30 DIAGNOSIS — N2581 Secondary hyperparathyroidism of renal origin: Secondary | ICD-10-CM | POA: Diagnosis not present

## 2019-10-31 DIAGNOSIS — N186 End stage renal disease: Secondary | ICD-10-CM | POA: Diagnosis not present

## 2019-10-31 DIAGNOSIS — D631 Anemia in chronic kidney disease: Secondary | ICD-10-CM | POA: Diagnosis not present

## 2019-10-31 DIAGNOSIS — N2581 Secondary hyperparathyroidism of renal origin: Secondary | ICD-10-CM | POA: Diagnosis not present

## 2019-11-01 DIAGNOSIS — D631 Anemia in chronic kidney disease: Secondary | ICD-10-CM | POA: Diagnosis not present

## 2019-11-01 DIAGNOSIS — N186 End stage renal disease: Secondary | ICD-10-CM | POA: Diagnosis not present

## 2019-11-01 DIAGNOSIS — N2581 Secondary hyperparathyroidism of renal origin: Secondary | ICD-10-CM | POA: Diagnosis not present

## 2019-11-02 DIAGNOSIS — N2581 Secondary hyperparathyroidism of renal origin: Secondary | ICD-10-CM | POA: Diagnosis not present

## 2019-11-02 DIAGNOSIS — N186 End stage renal disease: Secondary | ICD-10-CM | POA: Diagnosis not present

## 2019-11-02 DIAGNOSIS — D631 Anemia in chronic kidney disease: Secondary | ICD-10-CM | POA: Diagnosis not present

## 2019-11-03 DIAGNOSIS — N186 End stage renal disease: Secondary | ICD-10-CM | POA: Diagnosis not present

## 2019-11-03 DIAGNOSIS — D631 Anemia in chronic kidney disease: Secondary | ICD-10-CM | POA: Diagnosis not present

## 2019-11-03 DIAGNOSIS — N2581 Secondary hyperparathyroidism of renal origin: Secondary | ICD-10-CM | POA: Diagnosis not present

## 2019-11-04 DIAGNOSIS — N2581 Secondary hyperparathyroidism of renal origin: Secondary | ICD-10-CM | POA: Diagnosis not present

## 2019-11-04 DIAGNOSIS — N186 End stage renal disease: Secondary | ICD-10-CM | POA: Diagnosis not present

## 2019-11-04 DIAGNOSIS — D631 Anemia in chronic kidney disease: Secondary | ICD-10-CM | POA: Diagnosis not present

## 2019-11-05 DIAGNOSIS — D631 Anemia in chronic kidney disease: Secondary | ICD-10-CM | POA: Diagnosis not present

## 2019-11-05 DIAGNOSIS — N2581 Secondary hyperparathyroidism of renal origin: Secondary | ICD-10-CM | POA: Diagnosis not present

## 2019-11-05 DIAGNOSIS — N186 End stage renal disease: Secondary | ICD-10-CM | POA: Diagnosis not present

## 2019-11-06 DIAGNOSIS — D631 Anemia in chronic kidney disease: Secondary | ICD-10-CM | POA: Diagnosis not present

## 2019-11-06 DIAGNOSIS — N2581 Secondary hyperparathyroidism of renal origin: Secondary | ICD-10-CM | POA: Diagnosis not present

## 2019-11-06 DIAGNOSIS — N186 End stage renal disease: Secondary | ICD-10-CM | POA: Diagnosis not present

## 2019-11-07 DIAGNOSIS — N2581 Secondary hyperparathyroidism of renal origin: Secondary | ICD-10-CM | POA: Diagnosis not present

## 2019-11-07 DIAGNOSIS — N186 End stage renal disease: Secondary | ICD-10-CM | POA: Diagnosis not present

## 2019-11-07 DIAGNOSIS — D631 Anemia in chronic kidney disease: Secondary | ICD-10-CM | POA: Diagnosis not present

## 2019-11-08 DIAGNOSIS — N2581 Secondary hyperparathyroidism of renal origin: Secondary | ICD-10-CM | POA: Diagnosis not present

## 2019-11-08 DIAGNOSIS — N186 End stage renal disease: Secondary | ICD-10-CM | POA: Diagnosis not present

## 2019-11-08 DIAGNOSIS — D631 Anemia in chronic kidney disease: Secondary | ICD-10-CM | POA: Diagnosis not present

## 2019-11-09 DIAGNOSIS — D631 Anemia in chronic kidney disease: Secondary | ICD-10-CM | POA: Diagnosis not present

## 2019-11-09 DIAGNOSIS — N186 End stage renal disease: Secondary | ICD-10-CM | POA: Diagnosis not present

## 2019-11-09 DIAGNOSIS — N2581 Secondary hyperparathyroidism of renal origin: Secondary | ICD-10-CM | POA: Diagnosis not present

## 2019-11-10 DIAGNOSIS — N186 End stage renal disease: Secondary | ICD-10-CM | POA: Diagnosis not present

## 2019-11-10 DIAGNOSIS — D631 Anemia in chronic kidney disease: Secondary | ICD-10-CM | POA: Diagnosis not present

## 2019-11-10 DIAGNOSIS — N2581 Secondary hyperparathyroidism of renal origin: Secondary | ICD-10-CM | POA: Diagnosis not present

## 2019-11-11 DIAGNOSIS — N186 End stage renal disease: Secondary | ICD-10-CM | POA: Diagnosis not present

## 2019-11-11 DIAGNOSIS — N2581 Secondary hyperparathyroidism of renal origin: Secondary | ICD-10-CM | POA: Diagnosis not present

## 2019-11-11 DIAGNOSIS — D631 Anemia in chronic kidney disease: Secondary | ICD-10-CM | POA: Diagnosis not present

## 2019-11-12 DIAGNOSIS — D631 Anemia in chronic kidney disease: Secondary | ICD-10-CM | POA: Diagnosis not present

## 2019-11-12 DIAGNOSIS — N2581 Secondary hyperparathyroidism of renal origin: Secondary | ICD-10-CM | POA: Diagnosis not present

## 2019-11-12 DIAGNOSIS — N186 End stage renal disease: Secondary | ICD-10-CM | POA: Diagnosis not present

## 2019-11-13 DIAGNOSIS — Z79899 Other long term (current) drug therapy: Secondary | ICD-10-CM | POA: Diagnosis not present

## 2019-11-13 DIAGNOSIS — R9439 Abnormal result of other cardiovascular function study: Secondary | ICD-10-CM | POA: Diagnosis not present

## 2019-11-13 DIAGNOSIS — N186 End stage renal disease: Secondary | ICD-10-CM | POA: Diagnosis not present

## 2019-11-13 DIAGNOSIS — I120 Hypertensive chronic kidney disease with stage 5 chronic kidney disease or end stage renal disease: Secondary | ICD-10-CM | POA: Diagnosis not present

## 2019-11-13 DIAGNOSIS — D631 Anemia in chronic kidney disease: Secondary | ICD-10-CM | POA: Diagnosis not present

## 2019-11-13 DIAGNOSIS — N2581 Secondary hyperparathyroidism of renal origin: Secondary | ICD-10-CM | POA: Diagnosis not present

## 2019-11-13 DIAGNOSIS — Z87891 Personal history of nicotine dependence: Secondary | ICD-10-CM | POA: Diagnosis not present

## 2019-11-13 DIAGNOSIS — Z794 Long term (current) use of insulin: Secondary | ICD-10-CM | POA: Diagnosis not present

## 2019-11-13 DIAGNOSIS — E1122 Type 2 diabetes mellitus with diabetic chronic kidney disease: Secondary | ICD-10-CM | POA: Diagnosis not present

## 2019-11-13 DIAGNOSIS — Z01810 Encounter for preprocedural cardiovascular examination: Secondary | ICD-10-CM | POA: Diagnosis not present

## 2019-11-13 DIAGNOSIS — E785 Hyperlipidemia, unspecified: Secondary | ICD-10-CM | POA: Diagnosis not present

## 2019-11-13 DIAGNOSIS — Z880 Allergy status to penicillin: Secondary | ICD-10-CM | POA: Diagnosis not present

## 2019-11-13 DIAGNOSIS — Z992 Dependence on renal dialysis: Secondary | ICD-10-CM | POA: Diagnosis not present

## 2019-11-13 DIAGNOSIS — I2510 Atherosclerotic heart disease of native coronary artery without angina pectoris: Secondary | ICD-10-CM | POA: Diagnosis not present

## 2019-11-13 HISTORY — PX: CARDIAC CATHETERIZATION: SHX172

## 2019-11-14 DIAGNOSIS — N186 End stage renal disease: Secondary | ICD-10-CM | POA: Diagnosis not present

## 2019-11-14 DIAGNOSIS — N2581 Secondary hyperparathyroidism of renal origin: Secondary | ICD-10-CM | POA: Diagnosis not present

## 2019-11-14 DIAGNOSIS — D631 Anemia in chronic kidney disease: Secondary | ICD-10-CM | POA: Diagnosis not present

## 2019-11-15 DIAGNOSIS — N2581 Secondary hyperparathyroidism of renal origin: Secondary | ICD-10-CM | POA: Diagnosis not present

## 2019-11-15 DIAGNOSIS — D631 Anemia in chronic kidney disease: Secondary | ICD-10-CM | POA: Diagnosis not present

## 2019-11-15 DIAGNOSIS — N186 End stage renal disease: Secondary | ICD-10-CM | POA: Diagnosis not present

## 2019-11-16 DIAGNOSIS — N2581 Secondary hyperparathyroidism of renal origin: Secondary | ICD-10-CM | POA: Diagnosis not present

## 2019-11-16 DIAGNOSIS — N186 End stage renal disease: Secondary | ICD-10-CM | POA: Diagnosis not present

## 2019-11-16 DIAGNOSIS — D631 Anemia in chronic kidney disease: Secondary | ICD-10-CM | POA: Diagnosis not present

## 2019-11-17 DIAGNOSIS — N2581 Secondary hyperparathyroidism of renal origin: Secondary | ICD-10-CM | POA: Diagnosis not present

## 2019-11-17 DIAGNOSIS — N186 End stage renal disease: Secondary | ICD-10-CM | POA: Diagnosis not present

## 2019-11-17 DIAGNOSIS — D631 Anemia in chronic kidney disease: Secondary | ICD-10-CM | POA: Diagnosis not present

## 2019-11-18 DIAGNOSIS — N186 End stage renal disease: Secondary | ICD-10-CM | POA: Diagnosis not present

## 2019-11-18 DIAGNOSIS — N2581 Secondary hyperparathyroidism of renal origin: Secondary | ICD-10-CM | POA: Diagnosis not present

## 2019-11-18 DIAGNOSIS — D631 Anemia in chronic kidney disease: Secondary | ICD-10-CM | POA: Diagnosis not present

## 2019-11-19 DIAGNOSIS — N2581 Secondary hyperparathyroidism of renal origin: Secondary | ICD-10-CM | POA: Diagnosis not present

## 2019-11-19 DIAGNOSIS — D631 Anemia in chronic kidney disease: Secondary | ICD-10-CM | POA: Diagnosis not present

## 2019-11-19 DIAGNOSIS — N186 End stage renal disease: Secondary | ICD-10-CM | POA: Diagnosis not present

## 2019-11-20 DIAGNOSIS — N2581 Secondary hyperparathyroidism of renal origin: Secondary | ICD-10-CM | POA: Diagnosis not present

## 2019-11-20 DIAGNOSIS — D631 Anemia in chronic kidney disease: Secondary | ICD-10-CM | POA: Diagnosis not present

## 2019-11-20 DIAGNOSIS — N186 End stage renal disease: Secondary | ICD-10-CM | POA: Diagnosis not present

## 2019-11-21 DIAGNOSIS — D631 Anemia in chronic kidney disease: Secondary | ICD-10-CM | POA: Diagnosis not present

## 2019-11-21 DIAGNOSIS — N186 End stage renal disease: Secondary | ICD-10-CM | POA: Diagnosis not present

## 2019-11-21 DIAGNOSIS — N2581 Secondary hyperparathyroidism of renal origin: Secondary | ICD-10-CM | POA: Diagnosis not present

## 2019-11-22 DIAGNOSIS — N186 End stage renal disease: Secondary | ICD-10-CM | POA: Diagnosis not present

## 2019-11-22 DIAGNOSIS — N2581 Secondary hyperparathyroidism of renal origin: Secondary | ICD-10-CM | POA: Diagnosis not present

## 2019-11-22 DIAGNOSIS — D631 Anemia in chronic kidney disease: Secondary | ICD-10-CM | POA: Diagnosis not present

## 2019-11-23 DIAGNOSIS — D631 Anemia in chronic kidney disease: Secondary | ICD-10-CM | POA: Diagnosis not present

## 2019-11-23 DIAGNOSIS — N186 End stage renal disease: Secondary | ICD-10-CM | POA: Diagnosis not present

## 2019-11-23 DIAGNOSIS — N2581 Secondary hyperparathyroidism of renal origin: Secondary | ICD-10-CM | POA: Diagnosis not present

## 2019-11-24 DIAGNOSIS — N2581 Secondary hyperparathyroidism of renal origin: Secondary | ICD-10-CM | POA: Diagnosis not present

## 2019-11-24 DIAGNOSIS — N186 End stage renal disease: Secondary | ICD-10-CM | POA: Diagnosis not present

## 2019-11-24 DIAGNOSIS — D631 Anemia in chronic kidney disease: Secondary | ICD-10-CM | POA: Diagnosis not present

## 2019-11-25 DIAGNOSIS — N186 End stage renal disease: Secondary | ICD-10-CM | POA: Diagnosis not present

## 2019-11-25 DIAGNOSIS — D631 Anemia in chronic kidney disease: Secondary | ICD-10-CM | POA: Diagnosis not present

## 2019-11-25 DIAGNOSIS — N2581 Secondary hyperparathyroidism of renal origin: Secondary | ICD-10-CM | POA: Diagnosis not present

## 2019-11-26 DIAGNOSIS — N186 End stage renal disease: Secondary | ICD-10-CM | POA: Diagnosis not present

## 2019-11-26 DIAGNOSIS — N2581 Secondary hyperparathyroidism of renal origin: Secondary | ICD-10-CM | POA: Diagnosis not present

## 2019-11-26 DIAGNOSIS — D631 Anemia in chronic kidney disease: Secondary | ICD-10-CM | POA: Diagnosis not present

## 2019-11-27 DIAGNOSIS — N186 End stage renal disease: Secondary | ICD-10-CM | POA: Diagnosis not present

## 2019-11-27 DIAGNOSIS — N2581 Secondary hyperparathyroidism of renal origin: Secondary | ICD-10-CM | POA: Diagnosis not present

## 2019-11-27 DIAGNOSIS — D631 Anemia in chronic kidney disease: Secondary | ICD-10-CM | POA: Diagnosis not present

## 2019-11-28 DIAGNOSIS — N2581 Secondary hyperparathyroidism of renal origin: Secondary | ICD-10-CM | POA: Diagnosis not present

## 2019-11-28 DIAGNOSIS — N186 End stage renal disease: Secondary | ICD-10-CM | POA: Diagnosis not present

## 2019-11-28 DIAGNOSIS — D631 Anemia in chronic kidney disease: Secondary | ICD-10-CM | POA: Diagnosis not present

## 2019-11-29 DIAGNOSIS — N2581 Secondary hyperparathyroidism of renal origin: Secondary | ICD-10-CM | POA: Diagnosis not present

## 2019-11-29 DIAGNOSIS — N186 End stage renal disease: Secondary | ICD-10-CM | POA: Diagnosis not present

## 2019-11-29 DIAGNOSIS — D631 Anemia in chronic kidney disease: Secondary | ICD-10-CM | POA: Diagnosis not present

## 2019-11-30 DIAGNOSIS — N186 End stage renal disease: Secondary | ICD-10-CM | POA: Diagnosis not present

## 2019-11-30 DIAGNOSIS — N2581 Secondary hyperparathyroidism of renal origin: Secondary | ICD-10-CM | POA: Diagnosis not present

## 2019-11-30 DIAGNOSIS — D631 Anemia in chronic kidney disease: Secondary | ICD-10-CM | POA: Diagnosis not present

## 2019-12-01 DIAGNOSIS — N2581 Secondary hyperparathyroidism of renal origin: Secondary | ICD-10-CM | POA: Diagnosis not present

## 2019-12-01 DIAGNOSIS — N186 End stage renal disease: Secondary | ICD-10-CM | POA: Diagnosis not present

## 2019-12-01 DIAGNOSIS — D631 Anemia in chronic kidney disease: Secondary | ICD-10-CM | POA: Diagnosis not present

## 2019-12-02 DIAGNOSIS — N186 End stage renal disease: Secondary | ICD-10-CM | POA: Diagnosis not present

## 2019-12-02 DIAGNOSIS — D631 Anemia in chronic kidney disease: Secondary | ICD-10-CM | POA: Diagnosis not present

## 2019-12-02 DIAGNOSIS — N2581 Secondary hyperparathyroidism of renal origin: Secondary | ICD-10-CM | POA: Diagnosis not present

## 2019-12-03 DIAGNOSIS — D631 Anemia in chronic kidney disease: Secondary | ICD-10-CM | POA: Diagnosis not present

## 2019-12-03 DIAGNOSIS — N2581 Secondary hyperparathyroidism of renal origin: Secondary | ICD-10-CM | POA: Diagnosis not present

## 2019-12-03 DIAGNOSIS — N186 End stage renal disease: Secondary | ICD-10-CM | POA: Diagnosis not present

## 2019-12-04 DIAGNOSIS — N2581 Secondary hyperparathyroidism of renal origin: Secondary | ICD-10-CM | POA: Diagnosis not present

## 2019-12-04 DIAGNOSIS — D631 Anemia in chronic kidney disease: Secondary | ICD-10-CM | POA: Diagnosis not present

## 2019-12-04 DIAGNOSIS — N186 End stage renal disease: Secondary | ICD-10-CM | POA: Diagnosis not present

## 2019-12-05 DIAGNOSIS — N186 End stage renal disease: Secondary | ICD-10-CM | POA: Diagnosis not present

## 2019-12-05 DIAGNOSIS — N2581 Secondary hyperparathyroidism of renal origin: Secondary | ICD-10-CM | POA: Diagnosis not present

## 2019-12-05 DIAGNOSIS — D631 Anemia in chronic kidney disease: Secondary | ICD-10-CM | POA: Diagnosis not present

## 2019-12-06 DIAGNOSIS — N2581 Secondary hyperparathyroidism of renal origin: Secondary | ICD-10-CM | POA: Diagnosis not present

## 2019-12-06 DIAGNOSIS — D631 Anemia in chronic kidney disease: Secondary | ICD-10-CM | POA: Diagnosis not present

## 2019-12-06 DIAGNOSIS — N186 End stage renal disease: Secondary | ICD-10-CM | POA: Diagnosis not present

## 2019-12-07 DIAGNOSIS — N186 End stage renal disease: Secondary | ICD-10-CM | POA: Diagnosis not present

## 2019-12-07 DIAGNOSIS — D631 Anemia in chronic kidney disease: Secondary | ICD-10-CM | POA: Diagnosis not present

## 2019-12-07 DIAGNOSIS — N2581 Secondary hyperparathyroidism of renal origin: Secondary | ICD-10-CM | POA: Diagnosis not present

## 2019-12-08 DIAGNOSIS — N186 End stage renal disease: Secondary | ICD-10-CM | POA: Diagnosis not present

## 2019-12-08 DIAGNOSIS — D631 Anemia in chronic kidney disease: Secondary | ICD-10-CM | POA: Diagnosis not present

## 2019-12-08 DIAGNOSIS — N2581 Secondary hyperparathyroidism of renal origin: Secondary | ICD-10-CM | POA: Diagnosis not present

## 2019-12-09 DIAGNOSIS — N186 End stage renal disease: Secondary | ICD-10-CM | POA: Diagnosis not present

## 2019-12-09 DIAGNOSIS — D631 Anemia in chronic kidney disease: Secondary | ICD-10-CM | POA: Diagnosis not present

## 2019-12-09 DIAGNOSIS — N2581 Secondary hyperparathyroidism of renal origin: Secondary | ICD-10-CM | POA: Diagnosis not present

## 2019-12-10 DIAGNOSIS — N2581 Secondary hyperparathyroidism of renal origin: Secondary | ICD-10-CM | POA: Diagnosis not present

## 2019-12-10 DIAGNOSIS — D631 Anemia in chronic kidney disease: Secondary | ICD-10-CM | POA: Diagnosis not present

## 2019-12-10 DIAGNOSIS — N186 End stage renal disease: Secondary | ICD-10-CM | POA: Diagnosis not present

## 2019-12-10 DIAGNOSIS — E119 Type 2 diabetes mellitus without complications: Secondary | ICD-10-CM | POA: Diagnosis not present

## 2019-12-11 DIAGNOSIS — D631 Anemia in chronic kidney disease: Secondary | ICD-10-CM | POA: Diagnosis not present

## 2019-12-11 DIAGNOSIS — N2581 Secondary hyperparathyroidism of renal origin: Secondary | ICD-10-CM | POA: Diagnosis not present

## 2019-12-11 DIAGNOSIS — N186 End stage renal disease: Secondary | ICD-10-CM | POA: Diagnosis not present

## 2019-12-12 DIAGNOSIS — D631 Anemia in chronic kidney disease: Secondary | ICD-10-CM | POA: Diagnosis not present

## 2019-12-12 DIAGNOSIS — N186 End stage renal disease: Secondary | ICD-10-CM | POA: Diagnosis not present

## 2019-12-12 DIAGNOSIS — N2581 Secondary hyperparathyroidism of renal origin: Secondary | ICD-10-CM | POA: Diagnosis not present

## 2019-12-13 DIAGNOSIS — D631 Anemia in chronic kidney disease: Secondary | ICD-10-CM | POA: Diagnosis not present

## 2019-12-13 DIAGNOSIS — N186 End stage renal disease: Secondary | ICD-10-CM | POA: Diagnosis not present

## 2019-12-13 DIAGNOSIS — N2581 Secondary hyperparathyroidism of renal origin: Secondary | ICD-10-CM | POA: Diagnosis not present

## 2019-12-14 DIAGNOSIS — N186 End stage renal disease: Secondary | ICD-10-CM | POA: Diagnosis not present

## 2019-12-14 DIAGNOSIS — N2581 Secondary hyperparathyroidism of renal origin: Secondary | ICD-10-CM | POA: Diagnosis not present

## 2019-12-14 DIAGNOSIS — D631 Anemia in chronic kidney disease: Secondary | ICD-10-CM | POA: Diagnosis not present

## 2019-12-15 DIAGNOSIS — D631 Anemia in chronic kidney disease: Secondary | ICD-10-CM | POA: Diagnosis not present

## 2019-12-15 DIAGNOSIS — N2581 Secondary hyperparathyroidism of renal origin: Secondary | ICD-10-CM | POA: Diagnosis not present

## 2019-12-15 DIAGNOSIS — N186 End stage renal disease: Secondary | ICD-10-CM | POA: Diagnosis not present

## 2019-12-16 DIAGNOSIS — D631 Anemia in chronic kidney disease: Secondary | ICD-10-CM | POA: Diagnosis not present

## 2019-12-16 DIAGNOSIS — N186 End stage renal disease: Secondary | ICD-10-CM | POA: Diagnosis not present

## 2019-12-16 DIAGNOSIS — N2581 Secondary hyperparathyroidism of renal origin: Secondary | ICD-10-CM | POA: Diagnosis not present

## 2019-12-17 DIAGNOSIS — N186 End stage renal disease: Secondary | ICD-10-CM | POA: Diagnosis not present

## 2019-12-17 DIAGNOSIS — D631 Anemia in chronic kidney disease: Secondary | ICD-10-CM | POA: Diagnosis not present

## 2019-12-17 DIAGNOSIS — N2581 Secondary hyperparathyroidism of renal origin: Secondary | ICD-10-CM | POA: Diagnosis not present

## 2019-12-18 DIAGNOSIS — N2581 Secondary hyperparathyroidism of renal origin: Secondary | ICD-10-CM | POA: Diagnosis not present

## 2019-12-18 DIAGNOSIS — D631 Anemia in chronic kidney disease: Secondary | ICD-10-CM | POA: Diagnosis not present

## 2019-12-18 DIAGNOSIS — N186 End stage renal disease: Secondary | ICD-10-CM | POA: Diagnosis not present

## 2019-12-19 DIAGNOSIS — N186 End stage renal disease: Secondary | ICD-10-CM | POA: Diagnosis not present

## 2019-12-19 DIAGNOSIS — N2581 Secondary hyperparathyroidism of renal origin: Secondary | ICD-10-CM | POA: Diagnosis not present

## 2019-12-19 DIAGNOSIS — D631 Anemia in chronic kidney disease: Secondary | ICD-10-CM | POA: Diagnosis not present

## 2019-12-20 DIAGNOSIS — D631 Anemia in chronic kidney disease: Secondary | ICD-10-CM | POA: Diagnosis not present

## 2019-12-20 DIAGNOSIS — N186 End stage renal disease: Secondary | ICD-10-CM | POA: Diagnosis not present

## 2019-12-20 DIAGNOSIS — N2581 Secondary hyperparathyroidism of renal origin: Secondary | ICD-10-CM | POA: Diagnosis not present

## 2019-12-21 DIAGNOSIS — D631 Anemia in chronic kidney disease: Secondary | ICD-10-CM | POA: Diagnosis not present

## 2019-12-21 DIAGNOSIS — N2581 Secondary hyperparathyroidism of renal origin: Secondary | ICD-10-CM | POA: Diagnosis not present

## 2019-12-21 DIAGNOSIS — N186 End stage renal disease: Secondary | ICD-10-CM | POA: Diagnosis not present

## 2019-12-22 DIAGNOSIS — N186 End stage renal disease: Secondary | ICD-10-CM | POA: Diagnosis not present

## 2019-12-22 DIAGNOSIS — N2581 Secondary hyperparathyroidism of renal origin: Secondary | ICD-10-CM | POA: Diagnosis not present

## 2019-12-22 DIAGNOSIS — D631 Anemia in chronic kidney disease: Secondary | ICD-10-CM | POA: Diagnosis not present

## 2019-12-23 DIAGNOSIS — D631 Anemia in chronic kidney disease: Secondary | ICD-10-CM | POA: Diagnosis not present

## 2019-12-23 DIAGNOSIS — N186 End stage renal disease: Secondary | ICD-10-CM | POA: Diagnosis not present

## 2019-12-23 DIAGNOSIS — N2581 Secondary hyperparathyroidism of renal origin: Secondary | ICD-10-CM | POA: Diagnosis not present

## 2019-12-24 DIAGNOSIS — N2581 Secondary hyperparathyroidism of renal origin: Secondary | ICD-10-CM | POA: Diagnosis not present

## 2019-12-24 DIAGNOSIS — D631 Anemia in chronic kidney disease: Secondary | ICD-10-CM | POA: Diagnosis not present

## 2019-12-24 DIAGNOSIS — Z992 Dependence on renal dialysis: Secondary | ICD-10-CM | POA: Diagnosis not present

## 2019-12-24 DIAGNOSIS — N186 End stage renal disease: Secondary | ICD-10-CM | POA: Diagnosis not present

## 2019-12-25 DIAGNOSIS — D509 Iron deficiency anemia, unspecified: Secondary | ICD-10-CM | POA: Diagnosis not present

## 2019-12-25 DIAGNOSIS — N2581 Secondary hyperparathyroidism of renal origin: Secondary | ICD-10-CM | POA: Diagnosis not present

## 2019-12-25 DIAGNOSIS — E119 Type 2 diabetes mellitus without complications: Secondary | ICD-10-CM | POA: Diagnosis not present

## 2019-12-25 DIAGNOSIS — N186 End stage renal disease: Secondary | ICD-10-CM | POA: Diagnosis not present

## 2019-12-25 DIAGNOSIS — Z23 Encounter for immunization: Secondary | ICD-10-CM | POA: Diagnosis not present

## 2019-12-25 DIAGNOSIS — Z4932 Encounter for adequacy testing for peritoneal dialysis: Secondary | ICD-10-CM | POA: Diagnosis not present

## 2019-12-25 DIAGNOSIS — D631 Anemia in chronic kidney disease: Secondary | ICD-10-CM | POA: Diagnosis not present

## 2019-12-26 DIAGNOSIS — D509 Iron deficiency anemia, unspecified: Secondary | ICD-10-CM | POA: Diagnosis not present

## 2019-12-26 DIAGNOSIS — N2581 Secondary hyperparathyroidism of renal origin: Secondary | ICD-10-CM | POA: Diagnosis not present

## 2019-12-26 DIAGNOSIS — N186 End stage renal disease: Secondary | ICD-10-CM | POA: Diagnosis not present

## 2019-12-26 DIAGNOSIS — Z23 Encounter for immunization: Secondary | ICD-10-CM | POA: Diagnosis not present

## 2019-12-26 DIAGNOSIS — D631 Anemia in chronic kidney disease: Secondary | ICD-10-CM | POA: Diagnosis not present

## 2019-12-27 DIAGNOSIS — D631 Anemia in chronic kidney disease: Secondary | ICD-10-CM | POA: Diagnosis not present

## 2019-12-27 DIAGNOSIS — D509 Iron deficiency anemia, unspecified: Secondary | ICD-10-CM | POA: Diagnosis not present

## 2019-12-27 DIAGNOSIS — Z23 Encounter for immunization: Secondary | ICD-10-CM | POA: Diagnosis not present

## 2019-12-27 DIAGNOSIS — N2581 Secondary hyperparathyroidism of renal origin: Secondary | ICD-10-CM | POA: Diagnosis not present

## 2019-12-27 DIAGNOSIS — N186 End stage renal disease: Secondary | ICD-10-CM | POA: Diagnosis not present

## 2019-12-28 DIAGNOSIS — D631 Anemia in chronic kidney disease: Secondary | ICD-10-CM | POA: Diagnosis not present

## 2019-12-28 DIAGNOSIS — D509 Iron deficiency anemia, unspecified: Secondary | ICD-10-CM | POA: Diagnosis not present

## 2019-12-28 DIAGNOSIS — Z23 Encounter for immunization: Secondary | ICD-10-CM | POA: Diagnosis not present

## 2019-12-28 DIAGNOSIS — N2581 Secondary hyperparathyroidism of renal origin: Secondary | ICD-10-CM | POA: Diagnosis not present

## 2019-12-28 DIAGNOSIS — N186 End stage renal disease: Secondary | ICD-10-CM | POA: Diagnosis not present

## 2019-12-29 DIAGNOSIS — N2581 Secondary hyperparathyroidism of renal origin: Secondary | ICD-10-CM | POA: Diagnosis not present

## 2019-12-29 DIAGNOSIS — D631 Anemia in chronic kidney disease: Secondary | ICD-10-CM | POA: Diagnosis not present

## 2019-12-29 DIAGNOSIS — N186 End stage renal disease: Secondary | ICD-10-CM | POA: Diagnosis not present

## 2019-12-29 DIAGNOSIS — Z23 Encounter for immunization: Secondary | ICD-10-CM | POA: Diagnosis not present

## 2019-12-29 DIAGNOSIS — D509 Iron deficiency anemia, unspecified: Secondary | ICD-10-CM | POA: Diagnosis not present

## 2019-12-30 DIAGNOSIS — N2581 Secondary hyperparathyroidism of renal origin: Secondary | ICD-10-CM | POA: Diagnosis not present

## 2019-12-30 DIAGNOSIS — D509 Iron deficiency anemia, unspecified: Secondary | ICD-10-CM | POA: Diagnosis not present

## 2019-12-30 DIAGNOSIS — D631 Anemia in chronic kidney disease: Secondary | ICD-10-CM | POA: Diagnosis not present

## 2019-12-30 DIAGNOSIS — Z23 Encounter for immunization: Secondary | ICD-10-CM | POA: Diagnosis not present

## 2019-12-30 DIAGNOSIS — N186 End stage renal disease: Secondary | ICD-10-CM | POA: Diagnosis not present

## 2019-12-31 DIAGNOSIS — D509 Iron deficiency anemia, unspecified: Secondary | ICD-10-CM | POA: Diagnosis not present

## 2019-12-31 DIAGNOSIS — Z23 Encounter for immunization: Secondary | ICD-10-CM | POA: Diagnosis not present

## 2019-12-31 DIAGNOSIS — N2581 Secondary hyperparathyroidism of renal origin: Secondary | ICD-10-CM | POA: Diagnosis not present

## 2019-12-31 DIAGNOSIS — N186 End stage renal disease: Secondary | ICD-10-CM | POA: Diagnosis not present

## 2019-12-31 DIAGNOSIS — D631 Anemia in chronic kidney disease: Secondary | ICD-10-CM | POA: Diagnosis not present

## 2020-01-01 DIAGNOSIS — N186 End stage renal disease: Secondary | ICD-10-CM | POA: Diagnosis not present

## 2020-01-01 DIAGNOSIS — D631 Anemia in chronic kidney disease: Secondary | ICD-10-CM | POA: Diagnosis not present

## 2020-01-01 DIAGNOSIS — N2581 Secondary hyperparathyroidism of renal origin: Secondary | ICD-10-CM | POA: Diagnosis not present

## 2020-01-01 DIAGNOSIS — Z23 Encounter for immunization: Secondary | ICD-10-CM | POA: Diagnosis not present

## 2020-01-01 DIAGNOSIS — D509 Iron deficiency anemia, unspecified: Secondary | ICD-10-CM | POA: Diagnosis not present

## 2020-01-02 DIAGNOSIS — Z23 Encounter for immunization: Secondary | ICD-10-CM | POA: Diagnosis not present

## 2020-01-02 DIAGNOSIS — N2581 Secondary hyperparathyroidism of renal origin: Secondary | ICD-10-CM | POA: Diagnosis not present

## 2020-01-02 DIAGNOSIS — D509 Iron deficiency anemia, unspecified: Secondary | ICD-10-CM | POA: Diagnosis not present

## 2020-01-02 DIAGNOSIS — N186 End stage renal disease: Secondary | ICD-10-CM | POA: Diagnosis not present

## 2020-01-02 DIAGNOSIS — D631 Anemia in chronic kidney disease: Secondary | ICD-10-CM | POA: Diagnosis not present

## 2020-01-03 DIAGNOSIS — N186 End stage renal disease: Secondary | ICD-10-CM | POA: Diagnosis not present

## 2020-01-03 DIAGNOSIS — D631 Anemia in chronic kidney disease: Secondary | ICD-10-CM | POA: Diagnosis not present

## 2020-01-03 DIAGNOSIS — D509 Iron deficiency anemia, unspecified: Secondary | ICD-10-CM | POA: Diagnosis not present

## 2020-01-03 DIAGNOSIS — Z23 Encounter for immunization: Secondary | ICD-10-CM | POA: Diagnosis not present

## 2020-01-03 DIAGNOSIS — N2581 Secondary hyperparathyroidism of renal origin: Secondary | ICD-10-CM | POA: Diagnosis not present

## 2020-01-04 DIAGNOSIS — D509 Iron deficiency anemia, unspecified: Secondary | ICD-10-CM | POA: Diagnosis not present

## 2020-01-04 DIAGNOSIS — Z23 Encounter for immunization: Secondary | ICD-10-CM | POA: Diagnosis not present

## 2020-01-04 DIAGNOSIS — D631 Anemia in chronic kidney disease: Secondary | ICD-10-CM | POA: Diagnosis not present

## 2020-01-04 DIAGNOSIS — N186 End stage renal disease: Secondary | ICD-10-CM | POA: Diagnosis not present

## 2020-01-04 DIAGNOSIS — N2581 Secondary hyperparathyroidism of renal origin: Secondary | ICD-10-CM | POA: Diagnosis not present

## 2020-01-05 DIAGNOSIS — N2581 Secondary hyperparathyroidism of renal origin: Secondary | ICD-10-CM | POA: Diagnosis not present

## 2020-01-05 DIAGNOSIS — Z23 Encounter for immunization: Secondary | ICD-10-CM | POA: Diagnosis not present

## 2020-01-05 DIAGNOSIS — D509 Iron deficiency anemia, unspecified: Secondary | ICD-10-CM | POA: Diagnosis not present

## 2020-01-05 DIAGNOSIS — N186 End stage renal disease: Secondary | ICD-10-CM | POA: Diagnosis not present

## 2020-01-05 DIAGNOSIS — D631 Anemia in chronic kidney disease: Secondary | ICD-10-CM | POA: Diagnosis not present

## 2020-01-06 DIAGNOSIS — D631 Anemia in chronic kidney disease: Secondary | ICD-10-CM | POA: Diagnosis not present

## 2020-01-06 DIAGNOSIS — N2581 Secondary hyperparathyroidism of renal origin: Secondary | ICD-10-CM | POA: Diagnosis not present

## 2020-01-06 DIAGNOSIS — N186 End stage renal disease: Secondary | ICD-10-CM | POA: Diagnosis not present

## 2020-01-06 DIAGNOSIS — Z23 Encounter for immunization: Secondary | ICD-10-CM | POA: Diagnosis not present

## 2020-01-06 DIAGNOSIS — D509 Iron deficiency anemia, unspecified: Secondary | ICD-10-CM | POA: Diagnosis not present

## 2020-01-07 DIAGNOSIS — D509 Iron deficiency anemia, unspecified: Secondary | ICD-10-CM | POA: Diagnosis not present

## 2020-01-07 DIAGNOSIS — Z23 Encounter for immunization: Secondary | ICD-10-CM | POA: Diagnosis not present

## 2020-01-07 DIAGNOSIS — D631 Anemia in chronic kidney disease: Secondary | ICD-10-CM | POA: Diagnosis not present

## 2020-01-07 DIAGNOSIS — N186 End stage renal disease: Secondary | ICD-10-CM | POA: Diagnosis not present

## 2020-01-07 DIAGNOSIS — N2581 Secondary hyperparathyroidism of renal origin: Secondary | ICD-10-CM | POA: Diagnosis not present

## 2020-01-08 DIAGNOSIS — N186 End stage renal disease: Secondary | ICD-10-CM | POA: Diagnosis not present

## 2020-01-08 DIAGNOSIS — N2581 Secondary hyperparathyroidism of renal origin: Secondary | ICD-10-CM | POA: Diagnosis not present

## 2020-01-08 DIAGNOSIS — D631 Anemia in chronic kidney disease: Secondary | ICD-10-CM | POA: Diagnosis not present

## 2020-01-08 DIAGNOSIS — Z23 Encounter for immunization: Secondary | ICD-10-CM | POA: Diagnosis not present

## 2020-01-08 DIAGNOSIS — D509 Iron deficiency anemia, unspecified: Secondary | ICD-10-CM | POA: Diagnosis not present

## 2020-01-08 DIAGNOSIS — H1033 Unspecified acute conjunctivitis, bilateral: Secondary | ICD-10-CM | POA: Diagnosis not present

## 2020-01-09 DIAGNOSIS — D631 Anemia in chronic kidney disease: Secondary | ICD-10-CM | POA: Diagnosis not present

## 2020-01-09 DIAGNOSIS — N186 End stage renal disease: Secondary | ICD-10-CM | POA: Diagnosis not present

## 2020-01-09 DIAGNOSIS — Z23 Encounter for immunization: Secondary | ICD-10-CM | POA: Diagnosis not present

## 2020-01-09 DIAGNOSIS — N2581 Secondary hyperparathyroidism of renal origin: Secondary | ICD-10-CM | POA: Diagnosis not present

## 2020-01-09 DIAGNOSIS — D509 Iron deficiency anemia, unspecified: Secondary | ICD-10-CM | POA: Diagnosis not present

## 2020-01-10 DIAGNOSIS — Z6833 Body mass index (BMI) 33.0-33.9, adult: Secondary | ICD-10-CM | POA: Diagnosis not present

## 2020-01-10 DIAGNOSIS — I120 Hypertensive chronic kidney disease with stage 5 chronic kidney disease or end stage renal disease: Secondary | ICD-10-CM | POA: Diagnosis not present

## 2020-01-10 DIAGNOSIS — N186 End stage renal disease: Secondary | ICD-10-CM | POA: Diagnosis not present

## 2020-01-10 DIAGNOSIS — H5712 Ocular pain, left eye: Secondary | ICD-10-CM | POA: Diagnosis not present

## 2020-01-10 DIAGNOSIS — D631 Anemia in chronic kidney disease: Secondary | ICD-10-CM | POA: Diagnosis not present

## 2020-01-10 DIAGNOSIS — Z7982 Long term (current) use of aspirin: Secondary | ICD-10-CM | POA: Diagnosis not present

## 2020-01-10 DIAGNOSIS — Z7902 Long term (current) use of antithrombotics/antiplatelets: Secondary | ICD-10-CM | POA: Diagnosis not present

## 2020-01-10 DIAGNOSIS — E1122 Type 2 diabetes mellitus with diabetic chronic kidney disease: Secondary | ICD-10-CM | POA: Diagnosis not present

## 2020-01-10 DIAGNOSIS — H547 Unspecified visual loss: Secondary | ICD-10-CM | POA: Diagnosis not present

## 2020-01-10 DIAGNOSIS — Z87891 Personal history of nicotine dependence: Secondary | ICD-10-CM | POA: Diagnosis not present

## 2020-01-10 DIAGNOSIS — E785 Hyperlipidemia, unspecified: Secondary | ICD-10-CM | POA: Diagnosis not present

## 2020-01-10 DIAGNOSIS — H2102 Hyphema, left eye: Secondary | ICD-10-CM | POA: Diagnosis not present

## 2020-01-10 DIAGNOSIS — Z79899 Other long term (current) drug therapy: Secondary | ICD-10-CM | POA: Diagnosis not present

## 2020-01-10 DIAGNOSIS — Z880 Allergy status to penicillin: Secondary | ICD-10-CM | POA: Diagnosis not present

## 2020-01-10 DIAGNOSIS — Z23 Encounter for immunization: Secondary | ICD-10-CM | POA: Diagnosis not present

## 2020-01-10 DIAGNOSIS — I2510 Atherosclerotic heart disease of native coronary artery without angina pectoris: Secondary | ICD-10-CM | POA: Diagnosis not present

## 2020-01-10 DIAGNOSIS — Z794 Long term (current) use of insulin: Secondary | ICD-10-CM | POA: Diagnosis not present

## 2020-01-10 DIAGNOSIS — Z992 Dependence on renal dialysis: Secondary | ICD-10-CM | POA: Diagnosis not present

## 2020-01-10 DIAGNOSIS — N2581 Secondary hyperparathyroidism of renal origin: Secondary | ICD-10-CM | POA: Diagnosis not present

## 2020-01-10 DIAGNOSIS — D509 Iron deficiency anemia, unspecified: Secondary | ICD-10-CM | POA: Diagnosis not present

## 2020-01-11 DIAGNOSIS — N186 End stage renal disease: Secondary | ICD-10-CM | POA: Diagnosis not present

## 2020-01-11 DIAGNOSIS — Z23 Encounter for immunization: Secondary | ICD-10-CM | POA: Diagnosis not present

## 2020-01-11 DIAGNOSIS — H2102 Hyphema, left eye: Secondary | ICD-10-CM | POA: Diagnosis not present

## 2020-01-11 DIAGNOSIS — D509 Iron deficiency anemia, unspecified: Secondary | ICD-10-CM | POA: Diagnosis not present

## 2020-01-11 DIAGNOSIS — D631 Anemia in chronic kidney disease: Secondary | ICD-10-CM | POA: Diagnosis not present

## 2020-01-11 DIAGNOSIS — N2581 Secondary hyperparathyroidism of renal origin: Secondary | ICD-10-CM | POA: Diagnosis not present

## 2020-01-12 DIAGNOSIS — N2581 Secondary hyperparathyroidism of renal origin: Secondary | ICD-10-CM | POA: Diagnosis not present

## 2020-01-12 DIAGNOSIS — N186 End stage renal disease: Secondary | ICD-10-CM | POA: Diagnosis not present

## 2020-01-12 DIAGNOSIS — D631 Anemia in chronic kidney disease: Secondary | ICD-10-CM | POA: Diagnosis not present

## 2020-01-12 DIAGNOSIS — D509 Iron deficiency anemia, unspecified: Secondary | ICD-10-CM | POA: Diagnosis not present

## 2020-01-12 DIAGNOSIS — Z23 Encounter for immunization: Secondary | ICD-10-CM | POA: Diagnosis not present

## 2020-01-13 DIAGNOSIS — D631 Anemia in chronic kidney disease: Secondary | ICD-10-CM | POA: Diagnosis not present

## 2020-01-13 DIAGNOSIS — N2581 Secondary hyperparathyroidism of renal origin: Secondary | ICD-10-CM | POA: Diagnosis not present

## 2020-01-13 DIAGNOSIS — N186 End stage renal disease: Secondary | ICD-10-CM | POA: Diagnosis not present

## 2020-01-13 DIAGNOSIS — D509 Iron deficiency anemia, unspecified: Secondary | ICD-10-CM | POA: Diagnosis not present

## 2020-01-13 DIAGNOSIS — Z23 Encounter for immunization: Secondary | ICD-10-CM | POA: Diagnosis not present

## 2020-01-14 DIAGNOSIS — N186 End stage renal disease: Secondary | ICD-10-CM | POA: Diagnosis not present

## 2020-01-14 DIAGNOSIS — Z23 Encounter for immunization: Secondary | ICD-10-CM | POA: Diagnosis not present

## 2020-01-14 DIAGNOSIS — D631 Anemia in chronic kidney disease: Secondary | ICD-10-CM | POA: Diagnosis not present

## 2020-01-14 DIAGNOSIS — D509 Iron deficiency anemia, unspecified: Secondary | ICD-10-CM | POA: Diagnosis not present

## 2020-01-14 DIAGNOSIS — N2581 Secondary hyperparathyroidism of renal origin: Secondary | ICD-10-CM | POA: Diagnosis not present

## 2020-01-15 DIAGNOSIS — D509 Iron deficiency anemia, unspecified: Secondary | ICD-10-CM | POA: Diagnosis not present

## 2020-01-15 DIAGNOSIS — Z23 Encounter for immunization: Secondary | ICD-10-CM | POA: Diagnosis not present

## 2020-01-15 DIAGNOSIS — D631 Anemia in chronic kidney disease: Secondary | ICD-10-CM | POA: Diagnosis not present

## 2020-01-15 DIAGNOSIS — N2581 Secondary hyperparathyroidism of renal origin: Secondary | ICD-10-CM | POA: Diagnosis not present

## 2020-01-15 DIAGNOSIS — N186 End stage renal disease: Secondary | ICD-10-CM | POA: Diagnosis not present

## 2020-01-16 DIAGNOSIS — D631 Anemia in chronic kidney disease: Secondary | ICD-10-CM | POA: Diagnosis not present

## 2020-01-16 DIAGNOSIS — D509 Iron deficiency anemia, unspecified: Secondary | ICD-10-CM | POA: Diagnosis not present

## 2020-01-16 DIAGNOSIS — N186 End stage renal disease: Secondary | ICD-10-CM | POA: Diagnosis not present

## 2020-01-16 DIAGNOSIS — N2581 Secondary hyperparathyroidism of renal origin: Secondary | ICD-10-CM | POA: Diagnosis not present

## 2020-01-16 DIAGNOSIS — Z23 Encounter for immunization: Secondary | ICD-10-CM | POA: Diagnosis not present

## 2020-01-17 DIAGNOSIS — N2581 Secondary hyperparathyroidism of renal origin: Secondary | ICD-10-CM | POA: Diagnosis not present

## 2020-01-17 DIAGNOSIS — D509 Iron deficiency anemia, unspecified: Secondary | ICD-10-CM | POA: Diagnosis not present

## 2020-01-17 DIAGNOSIS — Z23 Encounter for immunization: Secondary | ICD-10-CM | POA: Diagnosis not present

## 2020-01-17 DIAGNOSIS — N186 End stage renal disease: Secondary | ICD-10-CM | POA: Diagnosis not present

## 2020-01-17 DIAGNOSIS — D631 Anemia in chronic kidney disease: Secondary | ICD-10-CM | POA: Diagnosis not present

## 2020-01-18 DIAGNOSIS — N186 End stage renal disease: Secondary | ICD-10-CM | POA: Diagnosis not present

## 2020-01-18 DIAGNOSIS — D509 Iron deficiency anemia, unspecified: Secondary | ICD-10-CM | POA: Diagnosis not present

## 2020-01-18 DIAGNOSIS — D631 Anemia in chronic kidney disease: Secondary | ICD-10-CM | POA: Diagnosis not present

## 2020-01-18 DIAGNOSIS — Z23 Encounter for immunization: Secondary | ICD-10-CM | POA: Diagnosis not present

## 2020-01-18 DIAGNOSIS — T8522XA Displacement of intraocular lens, initial encounter: Secondary | ICD-10-CM | POA: Insufficient documentation

## 2020-01-18 DIAGNOSIS — H2102 Hyphema, left eye: Secondary | ICD-10-CM | POA: Insufficient documentation

## 2020-01-18 DIAGNOSIS — N2581 Secondary hyperparathyroidism of renal origin: Secondary | ICD-10-CM | POA: Diagnosis not present

## 2020-01-19 DIAGNOSIS — Z23 Encounter for immunization: Secondary | ICD-10-CM | POA: Diagnosis not present

## 2020-01-19 DIAGNOSIS — D631 Anemia in chronic kidney disease: Secondary | ICD-10-CM | POA: Diagnosis not present

## 2020-01-19 DIAGNOSIS — N186 End stage renal disease: Secondary | ICD-10-CM | POA: Diagnosis not present

## 2020-01-19 DIAGNOSIS — D509 Iron deficiency anemia, unspecified: Secondary | ICD-10-CM | POA: Diagnosis not present

## 2020-01-19 DIAGNOSIS — N2581 Secondary hyperparathyroidism of renal origin: Secondary | ICD-10-CM | POA: Diagnosis not present

## 2020-01-20 DIAGNOSIS — N186 End stage renal disease: Secondary | ICD-10-CM | POA: Diagnosis not present

## 2020-01-20 DIAGNOSIS — D631 Anemia in chronic kidney disease: Secondary | ICD-10-CM | POA: Diagnosis not present

## 2020-01-20 DIAGNOSIS — D509 Iron deficiency anemia, unspecified: Secondary | ICD-10-CM | POA: Diagnosis not present

## 2020-01-20 DIAGNOSIS — N2581 Secondary hyperparathyroidism of renal origin: Secondary | ICD-10-CM | POA: Diagnosis not present

## 2020-01-20 DIAGNOSIS — Z23 Encounter for immunization: Secondary | ICD-10-CM | POA: Diagnosis not present

## 2020-01-21 DIAGNOSIS — D509 Iron deficiency anemia, unspecified: Secondary | ICD-10-CM | POA: Diagnosis not present

## 2020-01-21 DIAGNOSIS — N2581 Secondary hyperparathyroidism of renal origin: Secondary | ICD-10-CM | POA: Diagnosis not present

## 2020-01-21 DIAGNOSIS — Z23 Encounter for immunization: Secondary | ICD-10-CM | POA: Diagnosis not present

## 2020-01-21 DIAGNOSIS — D631 Anemia in chronic kidney disease: Secondary | ICD-10-CM | POA: Diagnosis not present

## 2020-01-21 DIAGNOSIS — N186 End stage renal disease: Secondary | ICD-10-CM | POA: Diagnosis not present

## 2020-01-22 DIAGNOSIS — N186 End stage renal disease: Secondary | ICD-10-CM | POA: Diagnosis not present

## 2020-01-22 DIAGNOSIS — N2581 Secondary hyperparathyroidism of renal origin: Secondary | ICD-10-CM | POA: Diagnosis not present

## 2020-01-22 DIAGNOSIS — D509 Iron deficiency anemia, unspecified: Secondary | ICD-10-CM | POA: Diagnosis not present

## 2020-01-22 DIAGNOSIS — D631 Anemia in chronic kidney disease: Secondary | ICD-10-CM | POA: Diagnosis not present

## 2020-01-22 DIAGNOSIS — Z23 Encounter for immunization: Secondary | ICD-10-CM | POA: Diagnosis not present

## 2020-01-23 DIAGNOSIS — D509 Iron deficiency anemia, unspecified: Secondary | ICD-10-CM | POA: Diagnosis not present

## 2020-01-23 DIAGNOSIS — Z23 Encounter for immunization: Secondary | ICD-10-CM | POA: Diagnosis not present

## 2020-01-23 DIAGNOSIS — D631 Anemia in chronic kidney disease: Secondary | ICD-10-CM | POA: Diagnosis not present

## 2020-01-23 DIAGNOSIS — N186 End stage renal disease: Secondary | ICD-10-CM | POA: Diagnosis not present

## 2020-01-23 DIAGNOSIS — N2581 Secondary hyperparathyroidism of renal origin: Secondary | ICD-10-CM | POA: Diagnosis not present

## 2020-01-24 DIAGNOSIS — D631 Anemia in chronic kidney disease: Secondary | ICD-10-CM | POA: Diagnosis not present

## 2020-01-24 DIAGNOSIS — N186 End stage renal disease: Secondary | ICD-10-CM | POA: Diagnosis not present

## 2020-01-24 DIAGNOSIS — D509 Iron deficiency anemia, unspecified: Secondary | ICD-10-CM | POA: Diagnosis not present

## 2020-01-24 DIAGNOSIS — Z23 Encounter for immunization: Secondary | ICD-10-CM | POA: Diagnosis not present

## 2020-01-24 DIAGNOSIS — Z992 Dependence on renal dialysis: Secondary | ICD-10-CM | POA: Diagnosis not present

## 2020-01-24 DIAGNOSIS — N2581 Secondary hyperparathyroidism of renal origin: Secondary | ICD-10-CM | POA: Diagnosis not present

## 2020-01-25 DIAGNOSIS — D509 Iron deficiency anemia, unspecified: Secondary | ICD-10-CM | POA: Diagnosis not present

## 2020-01-25 DIAGNOSIS — N2581 Secondary hyperparathyroidism of renal origin: Secondary | ICD-10-CM | POA: Diagnosis not present

## 2020-01-25 DIAGNOSIS — N186 End stage renal disease: Secondary | ICD-10-CM | POA: Diagnosis not present

## 2020-01-25 DIAGNOSIS — D631 Anemia in chronic kidney disease: Secondary | ICD-10-CM | POA: Diagnosis not present

## 2020-01-26 DIAGNOSIS — N186 End stage renal disease: Secondary | ICD-10-CM | POA: Diagnosis not present

## 2020-01-26 DIAGNOSIS — N2581 Secondary hyperparathyroidism of renal origin: Secondary | ICD-10-CM | POA: Diagnosis not present

## 2020-01-26 DIAGNOSIS — D631 Anemia in chronic kidney disease: Secondary | ICD-10-CM | POA: Diagnosis not present

## 2020-01-26 DIAGNOSIS — D509 Iron deficiency anemia, unspecified: Secondary | ICD-10-CM | POA: Diagnosis not present

## 2020-01-27 DIAGNOSIS — D509 Iron deficiency anemia, unspecified: Secondary | ICD-10-CM | POA: Diagnosis not present

## 2020-01-27 DIAGNOSIS — N2581 Secondary hyperparathyroidism of renal origin: Secondary | ICD-10-CM | POA: Diagnosis not present

## 2020-01-27 DIAGNOSIS — D631 Anemia in chronic kidney disease: Secondary | ICD-10-CM | POA: Diagnosis not present

## 2020-01-27 DIAGNOSIS — N186 End stage renal disease: Secondary | ICD-10-CM | POA: Diagnosis not present

## 2020-01-28 DIAGNOSIS — N186 End stage renal disease: Secondary | ICD-10-CM | POA: Diagnosis not present

## 2020-01-28 DIAGNOSIS — D631 Anemia in chronic kidney disease: Secondary | ICD-10-CM | POA: Diagnosis not present

## 2020-01-28 DIAGNOSIS — N2581 Secondary hyperparathyroidism of renal origin: Secondary | ICD-10-CM | POA: Diagnosis not present

## 2020-01-28 DIAGNOSIS — E119 Type 2 diabetes mellitus without complications: Secondary | ICD-10-CM | POA: Diagnosis not present

## 2020-01-28 DIAGNOSIS — D509 Iron deficiency anemia, unspecified: Secondary | ICD-10-CM | POA: Diagnosis not present

## 2020-01-29 DIAGNOSIS — N186 End stage renal disease: Secondary | ICD-10-CM | POA: Diagnosis not present

## 2020-01-29 DIAGNOSIS — D509 Iron deficiency anemia, unspecified: Secondary | ICD-10-CM | POA: Diagnosis not present

## 2020-01-29 DIAGNOSIS — N2581 Secondary hyperparathyroidism of renal origin: Secondary | ICD-10-CM | POA: Diagnosis not present

## 2020-01-29 DIAGNOSIS — D631 Anemia in chronic kidney disease: Secondary | ICD-10-CM | POA: Diagnosis not present

## 2020-01-30 DIAGNOSIS — D509 Iron deficiency anemia, unspecified: Secondary | ICD-10-CM | POA: Diagnosis not present

## 2020-01-30 DIAGNOSIS — N2581 Secondary hyperparathyroidism of renal origin: Secondary | ICD-10-CM | POA: Diagnosis not present

## 2020-01-30 DIAGNOSIS — D631 Anemia in chronic kidney disease: Secondary | ICD-10-CM | POA: Diagnosis not present

## 2020-01-30 DIAGNOSIS — N186 End stage renal disease: Secondary | ICD-10-CM | POA: Diagnosis not present

## 2020-01-31 DIAGNOSIS — N186 End stage renal disease: Secondary | ICD-10-CM | POA: Diagnosis not present

## 2020-01-31 DIAGNOSIS — N2581 Secondary hyperparathyroidism of renal origin: Secondary | ICD-10-CM | POA: Diagnosis not present

## 2020-01-31 DIAGNOSIS — D509 Iron deficiency anemia, unspecified: Secondary | ICD-10-CM | POA: Diagnosis not present

## 2020-01-31 DIAGNOSIS — D631 Anemia in chronic kidney disease: Secondary | ICD-10-CM | POA: Diagnosis not present

## 2020-02-01 DIAGNOSIS — N2581 Secondary hyperparathyroidism of renal origin: Secondary | ICD-10-CM | POA: Diagnosis not present

## 2020-02-01 DIAGNOSIS — D509 Iron deficiency anemia, unspecified: Secondary | ICD-10-CM | POA: Diagnosis not present

## 2020-02-01 DIAGNOSIS — D631 Anemia in chronic kidney disease: Secondary | ICD-10-CM | POA: Diagnosis not present

## 2020-02-01 DIAGNOSIS — N186 End stage renal disease: Secondary | ICD-10-CM | POA: Diagnosis not present

## 2020-02-02 DIAGNOSIS — D631 Anemia in chronic kidney disease: Secondary | ICD-10-CM | POA: Diagnosis not present

## 2020-02-02 DIAGNOSIS — D509 Iron deficiency anemia, unspecified: Secondary | ICD-10-CM | POA: Diagnosis not present

## 2020-02-02 DIAGNOSIS — N2581 Secondary hyperparathyroidism of renal origin: Secondary | ICD-10-CM | POA: Diagnosis not present

## 2020-02-02 DIAGNOSIS — N186 End stage renal disease: Secondary | ICD-10-CM | POA: Diagnosis not present

## 2020-02-03 DIAGNOSIS — D509 Iron deficiency anemia, unspecified: Secondary | ICD-10-CM | POA: Diagnosis not present

## 2020-02-03 DIAGNOSIS — N186 End stage renal disease: Secondary | ICD-10-CM | POA: Diagnosis not present

## 2020-02-03 DIAGNOSIS — N2581 Secondary hyperparathyroidism of renal origin: Secondary | ICD-10-CM | POA: Diagnosis not present

## 2020-02-03 DIAGNOSIS — D631 Anemia in chronic kidney disease: Secondary | ICD-10-CM | POA: Diagnosis not present

## 2020-02-04 DIAGNOSIS — N2581 Secondary hyperparathyroidism of renal origin: Secondary | ICD-10-CM | POA: Diagnosis not present

## 2020-02-04 DIAGNOSIS — D631 Anemia in chronic kidney disease: Secondary | ICD-10-CM | POA: Diagnosis not present

## 2020-02-04 DIAGNOSIS — N186 End stage renal disease: Secondary | ICD-10-CM | POA: Diagnosis not present

## 2020-02-04 DIAGNOSIS — D509 Iron deficiency anemia, unspecified: Secondary | ICD-10-CM | POA: Diagnosis not present

## 2020-02-05 DIAGNOSIS — N186 End stage renal disease: Secondary | ICD-10-CM | POA: Diagnosis not present

## 2020-02-05 DIAGNOSIS — D509 Iron deficiency anemia, unspecified: Secondary | ICD-10-CM | POA: Diagnosis not present

## 2020-02-05 DIAGNOSIS — D631 Anemia in chronic kidney disease: Secondary | ICD-10-CM | POA: Diagnosis not present

## 2020-02-05 DIAGNOSIS — N2581 Secondary hyperparathyroidism of renal origin: Secondary | ICD-10-CM | POA: Diagnosis not present

## 2020-02-06 DIAGNOSIS — N186 End stage renal disease: Secondary | ICD-10-CM | POA: Diagnosis not present

## 2020-02-06 DIAGNOSIS — D631 Anemia in chronic kidney disease: Secondary | ICD-10-CM | POA: Diagnosis not present

## 2020-02-06 DIAGNOSIS — D509 Iron deficiency anemia, unspecified: Secondary | ICD-10-CM | POA: Diagnosis not present

## 2020-02-06 DIAGNOSIS — N2581 Secondary hyperparathyroidism of renal origin: Secondary | ICD-10-CM | POA: Diagnosis not present

## 2020-02-07 DIAGNOSIS — D509 Iron deficiency anemia, unspecified: Secondary | ICD-10-CM | POA: Diagnosis not present

## 2020-02-07 DIAGNOSIS — D631 Anemia in chronic kidney disease: Secondary | ICD-10-CM | POA: Diagnosis not present

## 2020-02-07 DIAGNOSIS — N186 End stage renal disease: Secondary | ICD-10-CM | POA: Diagnosis not present

## 2020-02-07 DIAGNOSIS — N2581 Secondary hyperparathyroidism of renal origin: Secondary | ICD-10-CM | POA: Diagnosis not present

## 2020-02-08 DIAGNOSIS — N186 End stage renal disease: Secondary | ICD-10-CM | POA: Diagnosis not present

## 2020-02-08 DIAGNOSIS — D631 Anemia in chronic kidney disease: Secondary | ICD-10-CM | POA: Diagnosis not present

## 2020-02-08 DIAGNOSIS — D509 Iron deficiency anemia, unspecified: Secondary | ICD-10-CM | POA: Diagnosis not present

## 2020-02-08 DIAGNOSIS — N2581 Secondary hyperparathyroidism of renal origin: Secondary | ICD-10-CM | POA: Diagnosis not present

## 2020-02-09 DIAGNOSIS — D509 Iron deficiency anemia, unspecified: Secondary | ICD-10-CM | POA: Diagnosis not present

## 2020-02-09 DIAGNOSIS — D631 Anemia in chronic kidney disease: Secondary | ICD-10-CM | POA: Diagnosis not present

## 2020-02-09 DIAGNOSIS — N186 End stage renal disease: Secondary | ICD-10-CM | POA: Diagnosis not present

## 2020-02-09 DIAGNOSIS — N2581 Secondary hyperparathyroidism of renal origin: Secondary | ICD-10-CM | POA: Diagnosis not present

## 2020-02-10 DIAGNOSIS — N2581 Secondary hyperparathyroidism of renal origin: Secondary | ICD-10-CM | POA: Diagnosis not present

## 2020-02-10 DIAGNOSIS — N186 End stage renal disease: Secondary | ICD-10-CM | POA: Diagnosis not present

## 2020-02-10 DIAGNOSIS — D631 Anemia in chronic kidney disease: Secondary | ICD-10-CM | POA: Diagnosis not present

## 2020-02-10 DIAGNOSIS — D509 Iron deficiency anemia, unspecified: Secondary | ICD-10-CM | POA: Diagnosis not present

## 2020-02-11 DIAGNOSIS — N2581 Secondary hyperparathyroidism of renal origin: Secondary | ICD-10-CM | POA: Diagnosis not present

## 2020-02-11 DIAGNOSIS — D631 Anemia in chronic kidney disease: Secondary | ICD-10-CM | POA: Diagnosis not present

## 2020-02-11 DIAGNOSIS — D509 Iron deficiency anemia, unspecified: Secondary | ICD-10-CM | POA: Diagnosis not present

## 2020-02-11 DIAGNOSIS — N186 End stage renal disease: Secondary | ICD-10-CM | POA: Diagnosis not present

## 2020-02-12 DIAGNOSIS — D509 Iron deficiency anemia, unspecified: Secondary | ICD-10-CM | POA: Diagnosis not present

## 2020-02-12 DIAGNOSIS — D631 Anemia in chronic kidney disease: Secondary | ICD-10-CM | POA: Diagnosis not present

## 2020-02-12 DIAGNOSIS — N186 End stage renal disease: Secondary | ICD-10-CM | POA: Diagnosis not present

## 2020-02-12 DIAGNOSIS — N2581 Secondary hyperparathyroidism of renal origin: Secondary | ICD-10-CM | POA: Diagnosis not present

## 2020-02-13 DIAGNOSIS — D509 Iron deficiency anemia, unspecified: Secondary | ICD-10-CM | POA: Diagnosis not present

## 2020-02-13 DIAGNOSIS — N2581 Secondary hyperparathyroidism of renal origin: Secondary | ICD-10-CM | POA: Diagnosis not present

## 2020-02-13 DIAGNOSIS — D631 Anemia in chronic kidney disease: Secondary | ICD-10-CM | POA: Diagnosis not present

## 2020-02-13 DIAGNOSIS — N186 End stage renal disease: Secondary | ICD-10-CM | POA: Diagnosis not present

## 2020-02-14 DIAGNOSIS — N2581 Secondary hyperparathyroidism of renal origin: Secondary | ICD-10-CM | POA: Diagnosis not present

## 2020-02-14 DIAGNOSIS — D509 Iron deficiency anemia, unspecified: Secondary | ICD-10-CM | POA: Diagnosis not present

## 2020-02-14 DIAGNOSIS — D631 Anemia in chronic kidney disease: Secondary | ICD-10-CM | POA: Diagnosis not present

## 2020-02-14 DIAGNOSIS — N186 End stage renal disease: Secondary | ICD-10-CM | POA: Diagnosis not present

## 2020-02-15 DIAGNOSIS — N2581 Secondary hyperparathyroidism of renal origin: Secondary | ICD-10-CM | POA: Diagnosis not present

## 2020-02-15 DIAGNOSIS — D631 Anemia in chronic kidney disease: Secondary | ICD-10-CM | POA: Diagnosis not present

## 2020-02-15 DIAGNOSIS — D509 Iron deficiency anemia, unspecified: Secondary | ICD-10-CM | POA: Diagnosis not present

## 2020-02-15 DIAGNOSIS — N186 End stage renal disease: Secondary | ICD-10-CM | POA: Diagnosis not present

## 2020-02-16 DIAGNOSIS — D509 Iron deficiency anemia, unspecified: Secondary | ICD-10-CM | POA: Diagnosis not present

## 2020-02-16 DIAGNOSIS — N2581 Secondary hyperparathyroidism of renal origin: Secondary | ICD-10-CM | POA: Diagnosis not present

## 2020-02-16 DIAGNOSIS — N186 End stage renal disease: Secondary | ICD-10-CM | POA: Diagnosis not present

## 2020-02-16 DIAGNOSIS — D631 Anemia in chronic kidney disease: Secondary | ICD-10-CM | POA: Diagnosis not present

## 2020-02-17 DIAGNOSIS — N2581 Secondary hyperparathyroidism of renal origin: Secondary | ICD-10-CM | POA: Diagnosis not present

## 2020-02-17 DIAGNOSIS — N186 End stage renal disease: Secondary | ICD-10-CM | POA: Diagnosis not present

## 2020-02-17 DIAGNOSIS — D631 Anemia in chronic kidney disease: Secondary | ICD-10-CM | POA: Diagnosis not present

## 2020-02-17 DIAGNOSIS — D509 Iron deficiency anemia, unspecified: Secondary | ICD-10-CM | POA: Diagnosis not present

## 2020-02-18 DIAGNOSIS — D631 Anemia in chronic kidney disease: Secondary | ICD-10-CM | POA: Diagnosis not present

## 2020-02-18 DIAGNOSIS — N186 End stage renal disease: Secondary | ICD-10-CM | POA: Diagnosis not present

## 2020-02-18 DIAGNOSIS — D509 Iron deficiency anemia, unspecified: Secondary | ICD-10-CM | POA: Diagnosis not present

## 2020-02-18 DIAGNOSIS — N2581 Secondary hyperparathyroidism of renal origin: Secondary | ICD-10-CM | POA: Diagnosis not present

## 2020-02-19 DIAGNOSIS — D509 Iron deficiency anemia, unspecified: Secondary | ICD-10-CM | POA: Diagnosis not present

## 2020-02-19 DIAGNOSIS — D631 Anemia in chronic kidney disease: Secondary | ICD-10-CM | POA: Diagnosis not present

## 2020-02-19 DIAGNOSIS — N2581 Secondary hyperparathyroidism of renal origin: Secondary | ICD-10-CM | POA: Diagnosis not present

## 2020-02-19 DIAGNOSIS — N186 End stage renal disease: Secondary | ICD-10-CM | POA: Diagnosis not present

## 2020-02-20 DIAGNOSIS — N2581 Secondary hyperparathyroidism of renal origin: Secondary | ICD-10-CM | POA: Diagnosis not present

## 2020-02-20 DIAGNOSIS — D509 Iron deficiency anemia, unspecified: Secondary | ICD-10-CM | POA: Diagnosis not present

## 2020-02-20 DIAGNOSIS — N186 End stage renal disease: Secondary | ICD-10-CM | POA: Diagnosis not present

## 2020-02-20 DIAGNOSIS — D631 Anemia in chronic kidney disease: Secondary | ICD-10-CM | POA: Diagnosis not present

## 2020-02-21 DIAGNOSIS — N2581 Secondary hyperparathyroidism of renal origin: Secondary | ICD-10-CM | POA: Diagnosis not present

## 2020-02-21 DIAGNOSIS — D509 Iron deficiency anemia, unspecified: Secondary | ICD-10-CM | POA: Diagnosis not present

## 2020-02-21 DIAGNOSIS — D631 Anemia in chronic kidney disease: Secondary | ICD-10-CM | POA: Diagnosis not present

## 2020-02-21 DIAGNOSIS — N186 End stage renal disease: Secondary | ICD-10-CM | POA: Diagnosis not present

## 2020-02-22 DIAGNOSIS — N186 End stage renal disease: Secondary | ICD-10-CM | POA: Diagnosis not present

## 2020-02-22 DIAGNOSIS — D631 Anemia in chronic kidney disease: Secondary | ICD-10-CM | POA: Diagnosis not present

## 2020-02-22 DIAGNOSIS — D509 Iron deficiency anemia, unspecified: Secondary | ICD-10-CM | POA: Diagnosis not present

## 2020-02-22 DIAGNOSIS — N2581 Secondary hyperparathyroidism of renal origin: Secondary | ICD-10-CM | POA: Diagnosis not present

## 2020-02-23 DIAGNOSIS — D631 Anemia in chronic kidney disease: Secondary | ICD-10-CM | POA: Diagnosis not present

## 2020-02-23 DIAGNOSIS — N186 End stage renal disease: Secondary | ICD-10-CM | POA: Diagnosis not present

## 2020-02-23 DIAGNOSIS — D509 Iron deficiency anemia, unspecified: Secondary | ICD-10-CM | POA: Diagnosis not present

## 2020-02-23 DIAGNOSIS — N2581 Secondary hyperparathyroidism of renal origin: Secondary | ICD-10-CM | POA: Diagnosis not present

## 2020-02-23 DIAGNOSIS — Z992 Dependence on renal dialysis: Secondary | ICD-10-CM | POA: Diagnosis not present

## 2020-02-24 DIAGNOSIS — D631 Anemia in chronic kidney disease: Secondary | ICD-10-CM | POA: Diagnosis not present

## 2020-02-24 DIAGNOSIS — N186 End stage renal disease: Secondary | ICD-10-CM | POA: Diagnosis not present

## 2020-02-24 DIAGNOSIS — N2581 Secondary hyperparathyroidism of renal origin: Secondary | ICD-10-CM | POA: Diagnosis not present

## 2020-02-25 DIAGNOSIS — N2581 Secondary hyperparathyroidism of renal origin: Secondary | ICD-10-CM | POA: Diagnosis not present

## 2020-02-25 DIAGNOSIS — N186 End stage renal disease: Secondary | ICD-10-CM | POA: Diagnosis not present

## 2020-02-25 DIAGNOSIS — D631 Anemia in chronic kidney disease: Secondary | ICD-10-CM | POA: Diagnosis not present

## 2020-02-26 DIAGNOSIS — N186 End stage renal disease: Secondary | ICD-10-CM | POA: Diagnosis not present

## 2020-02-26 DIAGNOSIS — N2581 Secondary hyperparathyroidism of renal origin: Secondary | ICD-10-CM | POA: Diagnosis not present

## 2020-02-26 DIAGNOSIS — D631 Anemia in chronic kidney disease: Secondary | ICD-10-CM | POA: Diagnosis not present

## 2020-02-27 DIAGNOSIS — D631 Anemia in chronic kidney disease: Secondary | ICD-10-CM | POA: Diagnosis not present

## 2020-02-27 DIAGNOSIS — N2581 Secondary hyperparathyroidism of renal origin: Secondary | ICD-10-CM | POA: Diagnosis not present

## 2020-02-27 DIAGNOSIS — N186 End stage renal disease: Secondary | ICD-10-CM | POA: Diagnosis not present

## 2020-02-28 DIAGNOSIS — D631 Anemia in chronic kidney disease: Secondary | ICD-10-CM | POA: Diagnosis not present

## 2020-02-28 DIAGNOSIS — N2581 Secondary hyperparathyroidism of renal origin: Secondary | ICD-10-CM | POA: Diagnosis not present

## 2020-02-28 DIAGNOSIS — N186 End stage renal disease: Secondary | ICD-10-CM | POA: Diagnosis not present

## 2020-02-29 DIAGNOSIS — N2581 Secondary hyperparathyroidism of renal origin: Secondary | ICD-10-CM | POA: Diagnosis not present

## 2020-02-29 DIAGNOSIS — N186 End stage renal disease: Secondary | ICD-10-CM | POA: Diagnosis not present

## 2020-02-29 DIAGNOSIS — D631 Anemia in chronic kidney disease: Secondary | ICD-10-CM | POA: Diagnosis not present

## 2020-03-01 DIAGNOSIS — D631 Anemia in chronic kidney disease: Secondary | ICD-10-CM | POA: Diagnosis not present

## 2020-03-01 DIAGNOSIS — N2581 Secondary hyperparathyroidism of renal origin: Secondary | ICD-10-CM | POA: Diagnosis not present

## 2020-03-01 DIAGNOSIS — N186 End stage renal disease: Secondary | ICD-10-CM | POA: Diagnosis not present

## 2020-03-02 DIAGNOSIS — N186 End stage renal disease: Secondary | ICD-10-CM | POA: Diagnosis not present

## 2020-03-02 DIAGNOSIS — D631 Anemia in chronic kidney disease: Secondary | ICD-10-CM | POA: Diagnosis not present

## 2020-03-02 DIAGNOSIS — N2581 Secondary hyperparathyroidism of renal origin: Secondary | ICD-10-CM | POA: Diagnosis not present

## 2020-03-03 DIAGNOSIS — N186 End stage renal disease: Secondary | ICD-10-CM | POA: Diagnosis not present

## 2020-03-03 DIAGNOSIS — D631 Anemia in chronic kidney disease: Secondary | ICD-10-CM | POA: Diagnosis not present

## 2020-03-03 DIAGNOSIS — N2581 Secondary hyperparathyroidism of renal origin: Secondary | ICD-10-CM | POA: Diagnosis not present

## 2020-03-04 DIAGNOSIS — N2581 Secondary hyperparathyroidism of renal origin: Secondary | ICD-10-CM | POA: Diagnosis not present

## 2020-03-04 DIAGNOSIS — D631 Anemia in chronic kidney disease: Secondary | ICD-10-CM | POA: Diagnosis not present

## 2020-03-04 DIAGNOSIS — N186 End stage renal disease: Secondary | ICD-10-CM | POA: Diagnosis not present

## 2020-03-05 DIAGNOSIS — N186 End stage renal disease: Secondary | ICD-10-CM | POA: Diagnosis not present

## 2020-03-05 DIAGNOSIS — D631 Anemia in chronic kidney disease: Secondary | ICD-10-CM | POA: Diagnosis not present

## 2020-03-05 DIAGNOSIS — N2581 Secondary hyperparathyroidism of renal origin: Secondary | ICD-10-CM | POA: Diagnosis not present

## 2020-03-06 DIAGNOSIS — N186 End stage renal disease: Secondary | ICD-10-CM | POA: Diagnosis not present

## 2020-03-06 DIAGNOSIS — N2581 Secondary hyperparathyroidism of renal origin: Secondary | ICD-10-CM | POA: Diagnosis not present

## 2020-03-06 DIAGNOSIS — D631 Anemia in chronic kidney disease: Secondary | ICD-10-CM | POA: Diagnosis not present

## 2020-03-07 DIAGNOSIS — N2581 Secondary hyperparathyroidism of renal origin: Secondary | ICD-10-CM | POA: Diagnosis not present

## 2020-03-07 DIAGNOSIS — N186 End stage renal disease: Secondary | ICD-10-CM | POA: Diagnosis not present

## 2020-03-07 DIAGNOSIS — D631 Anemia in chronic kidney disease: Secondary | ICD-10-CM | POA: Diagnosis not present

## 2020-03-08 DIAGNOSIS — D631 Anemia in chronic kidney disease: Secondary | ICD-10-CM | POA: Diagnosis not present

## 2020-03-08 DIAGNOSIS — N2581 Secondary hyperparathyroidism of renal origin: Secondary | ICD-10-CM | POA: Diagnosis not present

## 2020-03-08 DIAGNOSIS — N186 End stage renal disease: Secondary | ICD-10-CM | POA: Diagnosis not present

## 2020-03-09 DIAGNOSIS — N2581 Secondary hyperparathyroidism of renal origin: Secondary | ICD-10-CM | POA: Diagnosis not present

## 2020-03-09 DIAGNOSIS — N186 End stage renal disease: Secondary | ICD-10-CM | POA: Diagnosis not present

## 2020-03-09 DIAGNOSIS — D631 Anemia in chronic kidney disease: Secondary | ICD-10-CM | POA: Diagnosis not present

## 2020-03-10 DIAGNOSIS — N2581 Secondary hyperparathyroidism of renal origin: Secondary | ICD-10-CM | POA: Diagnosis not present

## 2020-03-10 DIAGNOSIS — N186 End stage renal disease: Secondary | ICD-10-CM | POA: Diagnosis not present

## 2020-03-10 DIAGNOSIS — E119 Type 2 diabetes mellitus without complications: Secondary | ICD-10-CM | POA: Diagnosis not present

## 2020-03-10 DIAGNOSIS — D631 Anemia in chronic kidney disease: Secondary | ICD-10-CM | POA: Diagnosis not present

## 2020-03-11 DIAGNOSIS — D631 Anemia in chronic kidney disease: Secondary | ICD-10-CM | POA: Diagnosis not present

## 2020-03-11 DIAGNOSIS — N2581 Secondary hyperparathyroidism of renal origin: Secondary | ICD-10-CM | POA: Diagnosis not present

## 2020-03-11 DIAGNOSIS — N186 End stage renal disease: Secondary | ICD-10-CM | POA: Diagnosis not present

## 2020-03-12 DIAGNOSIS — N186 End stage renal disease: Secondary | ICD-10-CM | POA: Diagnosis not present

## 2020-03-12 DIAGNOSIS — N2581 Secondary hyperparathyroidism of renal origin: Secondary | ICD-10-CM | POA: Diagnosis not present

## 2020-03-12 DIAGNOSIS — D631 Anemia in chronic kidney disease: Secondary | ICD-10-CM | POA: Diagnosis not present

## 2020-03-13 DIAGNOSIS — N186 End stage renal disease: Secondary | ICD-10-CM | POA: Diagnosis not present

## 2020-03-13 DIAGNOSIS — N2581 Secondary hyperparathyroidism of renal origin: Secondary | ICD-10-CM | POA: Diagnosis not present

## 2020-03-13 DIAGNOSIS — D631 Anemia in chronic kidney disease: Secondary | ICD-10-CM | POA: Diagnosis not present

## 2020-03-14 DIAGNOSIS — D631 Anemia in chronic kidney disease: Secondary | ICD-10-CM | POA: Diagnosis not present

## 2020-03-14 DIAGNOSIS — N186 End stage renal disease: Secondary | ICD-10-CM | POA: Diagnosis not present

## 2020-03-14 DIAGNOSIS — N2581 Secondary hyperparathyroidism of renal origin: Secondary | ICD-10-CM | POA: Diagnosis not present

## 2020-03-15 DIAGNOSIS — N2581 Secondary hyperparathyroidism of renal origin: Secondary | ICD-10-CM | POA: Diagnosis not present

## 2020-03-15 DIAGNOSIS — D631 Anemia in chronic kidney disease: Secondary | ICD-10-CM | POA: Diagnosis not present

## 2020-03-15 DIAGNOSIS — N186 End stage renal disease: Secondary | ICD-10-CM | POA: Diagnosis not present

## 2020-03-16 DIAGNOSIS — D631 Anemia in chronic kidney disease: Secondary | ICD-10-CM | POA: Diagnosis not present

## 2020-03-16 DIAGNOSIS — N2581 Secondary hyperparathyroidism of renal origin: Secondary | ICD-10-CM | POA: Diagnosis not present

## 2020-03-16 DIAGNOSIS — N186 End stage renal disease: Secondary | ICD-10-CM | POA: Diagnosis not present

## 2020-03-17 DIAGNOSIS — N2581 Secondary hyperparathyroidism of renal origin: Secondary | ICD-10-CM | POA: Diagnosis not present

## 2020-03-17 DIAGNOSIS — D631 Anemia in chronic kidney disease: Secondary | ICD-10-CM | POA: Diagnosis not present

## 2020-03-17 DIAGNOSIS — N186 End stage renal disease: Secondary | ICD-10-CM | POA: Diagnosis not present

## 2020-03-18 DIAGNOSIS — D631 Anemia in chronic kidney disease: Secondary | ICD-10-CM | POA: Diagnosis not present

## 2020-03-18 DIAGNOSIS — N2581 Secondary hyperparathyroidism of renal origin: Secondary | ICD-10-CM | POA: Diagnosis not present

## 2020-03-18 DIAGNOSIS — N186 End stage renal disease: Secondary | ICD-10-CM | POA: Diagnosis not present

## 2020-03-19 DIAGNOSIS — D631 Anemia in chronic kidney disease: Secondary | ICD-10-CM | POA: Diagnosis not present

## 2020-03-19 DIAGNOSIS — N186 End stage renal disease: Secondary | ICD-10-CM | POA: Diagnosis not present

## 2020-03-19 DIAGNOSIS — N2581 Secondary hyperparathyroidism of renal origin: Secondary | ICD-10-CM | POA: Diagnosis not present

## 2020-03-20 DIAGNOSIS — N186 End stage renal disease: Secondary | ICD-10-CM | POA: Diagnosis not present

## 2020-03-20 DIAGNOSIS — D631 Anemia in chronic kidney disease: Secondary | ICD-10-CM | POA: Diagnosis not present

## 2020-03-20 DIAGNOSIS — N2581 Secondary hyperparathyroidism of renal origin: Secondary | ICD-10-CM | POA: Diagnosis not present

## 2020-03-21 DIAGNOSIS — D631 Anemia in chronic kidney disease: Secondary | ICD-10-CM | POA: Diagnosis not present

## 2020-03-21 DIAGNOSIS — N2581 Secondary hyperparathyroidism of renal origin: Secondary | ICD-10-CM | POA: Diagnosis not present

## 2020-03-21 DIAGNOSIS — N186 End stage renal disease: Secondary | ICD-10-CM | POA: Diagnosis not present

## 2020-03-22 DIAGNOSIS — D631 Anemia in chronic kidney disease: Secondary | ICD-10-CM | POA: Diagnosis not present

## 2020-03-22 DIAGNOSIS — N2581 Secondary hyperparathyroidism of renal origin: Secondary | ICD-10-CM | POA: Diagnosis not present

## 2020-03-22 DIAGNOSIS — N186 End stage renal disease: Secondary | ICD-10-CM | POA: Diagnosis not present

## 2020-03-23 DIAGNOSIS — D631 Anemia in chronic kidney disease: Secondary | ICD-10-CM | POA: Diagnosis not present

## 2020-03-23 DIAGNOSIS — N186 End stage renal disease: Secondary | ICD-10-CM | POA: Diagnosis not present

## 2020-03-23 DIAGNOSIS — N2581 Secondary hyperparathyroidism of renal origin: Secondary | ICD-10-CM | POA: Diagnosis not present

## 2020-03-24 DIAGNOSIS — N2581 Secondary hyperparathyroidism of renal origin: Secondary | ICD-10-CM | POA: Diagnosis not present

## 2020-03-24 DIAGNOSIS — N186 End stage renal disease: Secondary | ICD-10-CM | POA: Diagnosis not present

## 2020-03-24 DIAGNOSIS — D631 Anemia in chronic kidney disease: Secondary | ICD-10-CM | POA: Diagnosis not present

## 2020-03-25 DIAGNOSIS — N186 End stage renal disease: Secondary | ICD-10-CM | POA: Diagnosis not present

## 2020-03-25 DIAGNOSIS — D631 Anemia in chronic kidney disease: Secondary | ICD-10-CM | POA: Diagnosis not present

## 2020-03-25 DIAGNOSIS — N2581 Secondary hyperparathyroidism of renal origin: Secondary | ICD-10-CM | POA: Diagnosis not present

## 2020-03-26 DIAGNOSIS — N186 End stage renal disease: Secondary | ICD-10-CM | POA: Diagnosis not present

## 2020-03-27 DIAGNOSIS — N186 End stage renal disease: Secondary | ICD-10-CM | POA: Diagnosis not present

## 2020-03-28 DIAGNOSIS — N186 End stage renal disease: Secondary | ICD-10-CM | POA: Diagnosis not present

## 2020-03-29 DIAGNOSIS — N186 End stage renal disease: Secondary | ICD-10-CM | POA: Diagnosis not present

## 2020-03-30 DIAGNOSIS — N186 End stage renal disease: Secondary | ICD-10-CM | POA: Diagnosis not present

## 2020-03-31 DIAGNOSIS — N186 End stage renal disease: Secondary | ICD-10-CM | POA: Diagnosis not present

## 2020-04-01 DIAGNOSIS — N186 End stage renal disease: Secondary | ICD-10-CM | POA: Diagnosis not present

## 2020-04-02 DIAGNOSIS — N186 End stage renal disease: Secondary | ICD-10-CM | POA: Diagnosis not present

## 2020-04-03 DIAGNOSIS — N186 End stage renal disease: Secondary | ICD-10-CM | POA: Diagnosis not present

## 2020-04-04 DIAGNOSIS — N186 End stage renal disease: Secondary | ICD-10-CM | POA: Diagnosis not present

## 2020-04-05 DIAGNOSIS — N186 End stage renal disease: Secondary | ICD-10-CM | POA: Diagnosis not present

## 2020-04-06 DIAGNOSIS — N186 End stage renal disease: Secondary | ICD-10-CM | POA: Diagnosis not present

## 2020-04-07 DIAGNOSIS — N186 End stage renal disease: Secondary | ICD-10-CM | POA: Diagnosis not present

## 2020-04-08 DIAGNOSIS — N186 End stage renal disease: Secondary | ICD-10-CM | POA: Diagnosis not present

## 2020-04-09 DIAGNOSIS — N186 End stage renal disease: Secondary | ICD-10-CM | POA: Diagnosis not present

## 2020-04-10 DIAGNOSIS — N186 End stage renal disease: Secondary | ICD-10-CM | POA: Diagnosis not present

## 2020-04-11 DIAGNOSIS — N186 End stage renal disease: Secondary | ICD-10-CM | POA: Diagnosis not present

## 2020-04-12 DIAGNOSIS — N186 End stage renal disease: Secondary | ICD-10-CM | POA: Diagnosis not present

## 2020-04-13 DIAGNOSIS — N186 End stage renal disease: Secondary | ICD-10-CM | POA: Diagnosis not present

## 2020-04-14 DIAGNOSIS — R051 Acute cough: Secondary | ICD-10-CM | POA: Diagnosis not present

## 2020-04-14 DIAGNOSIS — J209 Acute bronchitis, unspecified: Secondary | ICD-10-CM | POA: Diagnosis not present

## 2020-04-14 DIAGNOSIS — N186 End stage renal disease: Secondary | ICD-10-CM | POA: Diagnosis not present

## 2020-04-14 DIAGNOSIS — Z20828 Contact with and (suspected) exposure to other viral communicable diseases: Secondary | ICD-10-CM | POA: Diagnosis not present

## 2020-04-14 DIAGNOSIS — R059 Cough, unspecified: Secondary | ICD-10-CM | POA: Diagnosis not present

## 2020-04-15 DIAGNOSIS — N186 End stage renal disease: Secondary | ICD-10-CM | POA: Diagnosis not present

## 2020-04-16 DIAGNOSIS — N186 End stage renal disease: Secondary | ICD-10-CM | POA: Diagnosis not present

## 2020-04-17 DIAGNOSIS — N186 End stage renal disease: Secondary | ICD-10-CM | POA: Diagnosis not present

## 2020-04-18 DIAGNOSIS — N186 End stage renal disease: Secondary | ICD-10-CM | POA: Diagnosis not present

## 2020-04-19 DIAGNOSIS — N186 End stage renal disease: Secondary | ICD-10-CM | POA: Diagnosis not present

## 2020-04-20 DIAGNOSIS — N186 End stage renal disease: Secondary | ICD-10-CM | POA: Diagnosis not present

## 2020-04-21 DIAGNOSIS — N186 End stage renal disease: Secondary | ICD-10-CM | POA: Diagnosis not present

## 2020-04-22 DIAGNOSIS — N186 End stage renal disease: Secondary | ICD-10-CM | POA: Diagnosis not present

## 2020-04-23 DIAGNOSIS — N186 End stage renal disease: Secondary | ICD-10-CM | POA: Diagnosis not present

## 2020-04-24 DIAGNOSIS — N186 End stage renal disease: Secondary | ICD-10-CM | POA: Diagnosis not present

## 2020-04-25 DIAGNOSIS — N186 End stage renal disease: Secondary | ICD-10-CM | POA: Diagnosis not present

## 2020-04-26 DIAGNOSIS — N2581 Secondary hyperparathyroidism of renal origin: Secondary | ICD-10-CM | POA: Diagnosis not present

## 2020-04-26 DIAGNOSIS — N186 End stage renal disease: Secondary | ICD-10-CM | POA: Diagnosis not present

## 2020-04-26 DIAGNOSIS — D631 Anemia in chronic kidney disease: Secondary | ICD-10-CM | POA: Diagnosis not present

## 2020-04-27 DIAGNOSIS — N186 End stage renal disease: Secondary | ICD-10-CM | POA: Diagnosis not present

## 2020-04-27 DIAGNOSIS — D631 Anemia in chronic kidney disease: Secondary | ICD-10-CM | POA: Diagnosis not present

## 2020-04-27 DIAGNOSIS — N2581 Secondary hyperparathyroidism of renal origin: Secondary | ICD-10-CM | POA: Diagnosis not present

## 2020-04-28 DIAGNOSIS — N2581 Secondary hyperparathyroidism of renal origin: Secondary | ICD-10-CM | POA: Diagnosis not present

## 2020-04-28 DIAGNOSIS — E119 Type 2 diabetes mellitus without complications: Secondary | ICD-10-CM | POA: Diagnosis not present

## 2020-04-28 DIAGNOSIS — Z1159 Encounter for screening for other viral diseases: Secondary | ICD-10-CM | POA: Diagnosis not present

## 2020-04-28 DIAGNOSIS — D631 Anemia in chronic kidney disease: Secondary | ICD-10-CM | POA: Diagnosis not present

## 2020-04-28 DIAGNOSIS — N186 End stage renal disease: Secondary | ICD-10-CM | POA: Diagnosis not present

## 2020-04-29 DIAGNOSIS — D631 Anemia in chronic kidney disease: Secondary | ICD-10-CM | POA: Diagnosis not present

## 2020-04-29 DIAGNOSIS — R300 Dysuria: Secondary | ICD-10-CM | POA: Diagnosis not present

## 2020-04-29 DIAGNOSIS — N2581 Secondary hyperparathyroidism of renal origin: Secondary | ICD-10-CM | POA: Diagnosis not present

## 2020-04-29 DIAGNOSIS — N186 End stage renal disease: Secondary | ICD-10-CM | POA: Diagnosis not present

## 2020-04-30 DIAGNOSIS — N2581 Secondary hyperparathyroidism of renal origin: Secondary | ICD-10-CM | POA: Diagnosis not present

## 2020-04-30 DIAGNOSIS — N186 End stage renal disease: Secondary | ICD-10-CM | POA: Diagnosis not present

## 2020-04-30 DIAGNOSIS — D631 Anemia in chronic kidney disease: Secondary | ICD-10-CM | POA: Diagnosis not present

## 2020-05-01 DIAGNOSIS — N186 End stage renal disease: Secondary | ICD-10-CM | POA: Diagnosis not present

## 2020-05-01 DIAGNOSIS — D631 Anemia in chronic kidney disease: Secondary | ICD-10-CM | POA: Diagnosis not present

## 2020-05-01 DIAGNOSIS — N2581 Secondary hyperparathyroidism of renal origin: Secondary | ICD-10-CM | POA: Diagnosis not present

## 2020-05-02 DIAGNOSIS — N2581 Secondary hyperparathyroidism of renal origin: Secondary | ICD-10-CM | POA: Diagnosis not present

## 2020-05-02 DIAGNOSIS — N186 End stage renal disease: Secondary | ICD-10-CM | POA: Diagnosis not present

## 2020-05-02 DIAGNOSIS — D631 Anemia in chronic kidney disease: Secondary | ICD-10-CM | POA: Diagnosis not present

## 2020-05-03 DIAGNOSIS — N186 End stage renal disease: Secondary | ICD-10-CM | POA: Diagnosis not present

## 2020-05-03 DIAGNOSIS — N2581 Secondary hyperparathyroidism of renal origin: Secondary | ICD-10-CM | POA: Diagnosis not present

## 2020-05-03 DIAGNOSIS — D631 Anemia in chronic kidney disease: Secondary | ICD-10-CM | POA: Diagnosis not present

## 2020-05-04 DIAGNOSIS — D631 Anemia in chronic kidney disease: Secondary | ICD-10-CM | POA: Diagnosis not present

## 2020-05-04 DIAGNOSIS — N186 End stage renal disease: Secondary | ICD-10-CM | POA: Diagnosis not present

## 2020-05-04 DIAGNOSIS — N2581 Secondary hyperparathyroidism of renal origin: Secondary | ICD-10-CM | POA: Diagnosis not present

## 2020-05-05 DIAGNOSIS — N2581 Secondary hyperparathyroidism of renal origin: Secondary | ICD-10-CM | POA: Diagnosis not present

## 2020-05-05 DIAGNOSIS — D631 Anemia in chronic kidney disease: Secondary | ICD-10-CM | POA: Diagnosis not present

## 2020-05-05 DIAGNOSIS — N186 End stage renal disease: Secondary | ICD-10-CM | POA: Diagnosis not present

## 2020-05-06 DIAGNOSIS — N186 End stage renal disease: Secondary | ICD-10-CM | POA: Diagnosis not present

## 2020-05-06 DIAGNOSIS — N2581 Secondary hyperparathyroidism of renal origin: Secondary | ICD-10-CM | POA: Diagnosis not present

## 2020-05-06 DIAGNOSIS — D631 Anemia in chronic kidney disease: Secondary | ICD-10-CM | POA: Diagnosis not present

## 2020-05-07 DIAGNOSIS — D631 Anemia in chronic kidney disease: Secondary | ICD-10-CM | POA: Diagnosis not present

## 2020-05-07 DIAGNOSIS — N2581 Secondary hyperparathyroidism of renal origin: Secondary | ICD-10-CM | POA: Diagnosis not present

## 2020-05-07 DIAGNOSIS — N186 End stage renal disease: Secondary | ICD-10-CM | POA: Diagnosis not present

## 2020-05-08 DIAGNOSIS — N2581 Secondary hyperparathyroidism of renal origin: Secondary | ICD-10-CM | POA: Diagnosis not present

## 2020-05-08 DIAGNOSIS — D631 Anemia in chronic kidney disease: Secondary | ICD-10-CM | POA: Diagnosis not present

## 2020-05-08 DIAGNOSIS — N186 End stage renal disease: Secondary | ICD-10-CM | POA: Diagnosis not present

## 2020-05-09 DIAGNOSIS — N2581 Secondary hyperparathyroidism of renal origin: Secondary | ICD-10-CM | POA: Diagnosis not present

## 2020-05-09 DIAGNOSIS — N186 End stage renal disease: Secondary | ICD-10-CM | POA: Diagnosis not present

## 2020-05-09 DIAGNOSIS — D631 Anemia in chronic kidney disease: Secondary | ICD-10-CM | POA: Diagnosis not present

## 2020-05-10 DIAGNOSIS — N2581 Secondary hyperparathyroidism of renal origin: Secondary | ICD-10-CM | POA: Diagnosis not present

## 2020-05-10 DIAGNOSIS — D631 Anemia in chronic kidney disease: Secondary | ICD-10-CM | POA: Diagnosis not present

## 2020-05-10 DIAGNOSIS — N186 End stage renal disease: Secondary | ICD-10-CM | POA: Diagnosis not present

## 2020-05-11 DIAGNOSIS — D631 Anemia in chronic kidney disease: Secondary | ICD-10-CM | POA: Diagnosis not present

## 2020-05-11 DIAGNOSIS — N2581 Secondary hyperparathyroidism of renal origin: Secondary | ICD-10-CM | POA: Diagnosis not present

## 2020-05-11 DIAGNOSIS — N186 End stage renal disease: Secondary | ICD-10-CM | POA: Diagnosis not present

## 2020-05-12 DIAGNOSIS — N2581 Secondary hyperparathyroidism of renal origin: Secondary | ICD-10-CM | POA: Diagnosis not present

## 2020-05-12 DIAGNOSIS — D631 Anemia in chronic kidney disease: Secondary | ICD-10-CM | POA: Diagnosis not present

## 2020-05-12 DIAGNOSIS — N186 End stage renal disease: Secondary | ICD-10-CM | POA: Diagnosis not present

## 2020-05-13 DIAGNOSIS — N186 End stage renal disease: Secondary | ICD-10-CM | POA: Diagnosis not present

## 2020-05-13 DIAGNOSIS — N2581 Secondary hyperparathyroidism of renal origin: Secondary | ICD-10-CM | POA: Diagnosis not present

## 2020-05-13 DIAGNOSIS — D631 Anemia in chronic kidney disease: Secondary | ICD-10-CM | POA: Diagnosis not present

## 2020-05-14 DIAGNOSIS — D631 Anemia in chronic kidney disease: Secondary | ICD-10-CM | POA: Diagnosis not present

## 2020-05-14 DIAGNOSIS — N2581 Secondary hyperparathyroidism of renal origin: Secondary | ICD-10-CM | POA: Diagnosis not present

## 2020-05-14 DIAGNOSIS — N186 End stage renal disease: Secondary | ICD-10-CM | POA: Diagnosis not present

## 2020-05-15 DIAGNOSIS — N2581 Secondary hyperparathyroidism of renal origin: Secondary | ICD-10-CM | POA: Diagnosis not present

## 2020-05-15 DIAGNOSIS — N186 End stage renal disease: Secondary | ICD-10-CM | POA: Diagnosis not present

## 2020-05-15 DIAGNOSIS — D631 Anemia in chronic kidney disease: Secondary | ICD-10-CM | POA: Diagnosis not present

## 2020-05-16 DIAGNOSIS — D631 Anemia in chronic kidney disease: Secondary | ICD-10-CM | POA: Diagnosis not present

## 2020-05-16 DIAGNOSIS — N2581 Secondary hyperparathyroidism of renal origin: Secondary | ICD-10-CM | POA: Diagnosis not present

## 2020-05-16 DIAGNOSIS — N186 End stage renal disease: Secondary | ICD-10-CM | POA: Diagnosis not present

## 2020-05-17 DIAGNOSIS — N186 End stage renal disease: Secondary | ICD-10-CM | POA: Diagnosis not present

## 2020-05-17 DIAGNOSIS — N2581 Secondary hyperparathyroidism of renal origin: Secondary | ICD-10-CM | POA: Diagnosis not present

## 2020-05-17 DIAGNOSIS — D631 Anemia in chronic kidney disease: Secondary | ICD-10-CM | POA: Diagnosis not present

## 2020-05-18 DIAGNOSIS — N186 End stage renal disease: Secondary | ICD-10-CM | POA: Diagnosis not present

## 2020-05-18 DIAGNOSIS — D631 Anemia in chronic kidney disease: Secondary | ICD-10-CM | POA: Diagnosis not present

## 2020-05-18 DIAGNOSIS — N2581 Secondary hyperparathyroidism of renal origin: Secondary | ICD-10-CM | POA: Diagnosis not present

## 2020-05-19 DIAGNOSIS — N2581 Secondary hyperparathyroidism of renal origin: Secondary | ICD-10-CM | POA: Diagnosis not present

## 2020-05-19 DIAGNOSIS — D631 Anemia in chronic kidney disease: Secondary | ICD-10-CM | POA: Diagnosis not present

## 2020-05-19 DIAGNOSIS — N186 End stage renal disease: Secondary | ICD-10-CM | POA: Diagnosis not present

## 2020-05-20 DIAGNOSIS — N2581 Secondary hyperparathyroidism of renal origin: Secondary | ICD-10-CM | POA: Diagnosis not present

## 2020-05-20 DIAGNOSIS — N186 End stage renal disease: Secondary | ICD-10-CM | POA: Diagnosis not present

## 2020-05-20 DIAGNOSIS — D631 Anemia in chronic kidney disease: Secondary | ICD-10-CM | POA: Diagnosis not present

## 2020-05-21 DIAGNOSIS — N186 End stage renal disease: Secondary | ICD-10-CM | POA: Diagnosis not present

## 2020-05-21 DIAGNOSIS — D631 Anemia in chronic kidney disease: Secondary | ICD-10-CM | POA: Diagnosis not present

## 2020-05-21 DIAGNOSIS — N2581 Secondary hyperparathyroidism of renal origin: Secondary | ICD-10-CM | POA: Diagnosis not present

## 2020-05-22 DIAGNOSIS — N2581 Secondary hyperparathyroidism of renal origin: Secondary | ICD-10-CM | POA: Diagnosis not present

## 2020-05-22 DIAGNOSIS — N186 End stage renal disease: Secondary | ICD-10-CM | POA: Diagnosis not present

## 2020-05-22 DIAGNOSIS — D631 Anemia in chronic kidney disease: Secondary | ICD-10-CM | POA: Diagnosis not present

## 2020-05-23 DIAGNOSIS — N2581 Secondary hyperparathyroidism of renal origin: Secondary | ICD-10-CM | POA: Diagnosis not present

## 2020-05-23 DIAGNOSIS — Z992 Dependence on renal dialysis: Secondary | ICD-10-CM | POA: Diagnosis not present

## 2020-05-23 DIAGNOSIS — I10 Essential (primary) hypertension: Secondary | ICD-10-CM | POA: Diagnosis not present

## 2020-05-23 DIAGNOSIS — E782 Mixed hyperlipidemia: Secondary | ICD-10-CM | POA: Diagnosis not present

## 2020-05-23 DIAGNOSIS — E1165 Type 2 diabetes mellitus with hyperglycemia: Secondary | ICD-10-CM | POA: Diagnosis not present

## 2020-05-23 DIAGNOSIS — D631 Anemia in chronic kidney disease: Secondary | ICD-10-CM | POA: Diagnosis not present

## 2020-05-23 DIAGNOSIS — N186 End stage renal disease: Secondary | ICD-10-CM | POA: Diagnosis not present

## 2020-05-23 DIAGNOSIS — E559 Vitamin D deficiency, unspecified: Secondary | ICD-10-CM | POA: Diagnosis not present

## 2020-05-24 DIAGNOSIS — D509 Iron deficiency anemia, unspecified: Secondary | ICD-10-CM | POA: Diagnosis not present

## 2020-05-24 DIAGNOSIS — N2581 Secondary hyperparathyroidism of renal origin: Secondary | ICD-10-CM | POA: Diagnosis not present

## 2020-05-24 DIAGNOSIS — D631 Anemia in chronic kidney disease: Secondary | ICD-10-CM | POA: Diagnosis not present

## 2020-05-24 DIAGNOSIS — N186 End stage renal disease: Secondary | ICD-10-CM | POA: Diagnosis not present

## 2020-05-25 DIAGNOSIS — N186 End stage renal disease: Secondary | ICD-10-CM | POA: Diagnosis not present

## 2020-05-25 DIAGNOSIS — D509 Iron deficiency anemia, unspecified: Secondary | ICD-10-CM | POA: Diagnosis not present

## 2020-05-25 DIAGNOSIS — N2581 Secondary hyperparathyroidism of renal origin: Secondary | ICD-10-CM | POA: Diagnosis not present

## 2020-05-25 DIAGNOSIS — D631 Anemia in chronic kidney disease: Secondary | ICD-10-CM | POA: Diagnosis not present

## 2020-05-26 DIAGNOSIS — N186 End stage renal disease: Secondary | ICD-10-CM | POA: Diagnosis not present

## 2020-05-26 DIAGNOSIS — E785 Hyperlipidemia, unspecified: Secondary | ICD-10-CM | POA: Diagnosis not present

## 2020-05-26 DIAGNOSIS — N2581 Secondary hyperparathyroidism of renal origin: Secondary | ICD-10-CM | POA: Diagnosis not present

## 2020-05-26 DIAGNOSIS — D631 Anemia in chronic kidney disease: Secondary | ICD-10-CM | POA: Diagnosis not present

## 2020-05-26 DIAGNOSIS — I10 Essential (primary) hypertension: Secondary | ICD-10-CM | POA: Diagnosis not present

## 2020-05-26 DIAGNOSIS — Z9114 Patient's other noncompliance with medication regimen: Secondary | ICD-10-CM | POA: Diagnosis not present

## 2020-05-26 DIAGNOSIS — E1165 Type 2 diabetes mellitus with hyperglycemia: Secondary | ICD-10-CM | POA: Diagnosis not present

## 2020-05-26 DIAGNOSIS — D509 Iron deficiency anemia, unspecified: Secondary | ICD-10-CM | POA: Diagnosis not present

## 2020-05-27 DIAGNOSIS — N2581 Secondary hyperparathyroidism of renal origin: Secondary | ICD-10-CM | POA: Diagnosis not present

## 2020-05-27 DIAGNOSIS — N186 End stage renal disease: Secondary | ICD-10-CM | POA: Diagnosis not present

## 2020-05-27 DIAGNOSIS — D509 Iron deficiency anemia, unspecified: Secondary | ICD-10-CM | POA: Diagnosis not present

## 2020-05-27 DIAGNOSIS — D631 Anemia in chronic kidney disease: Secondary | ICD-10-CM | POA: Diagnosis not present

## 2020-05-28 DIAGNOSIS — N2581 Secondary hyperparathyroidism of renal origin: Secondary | ICD-10-CM | POA: Diagnosis not present

## 2020-05-28 DIAGNOSIS — N186 End stage renal disease: Secondary | ICD-10-CM | POA: Diagnosis not present

## 2020-05-28 DIAGNOSIS — D509 Iron deficiency anemia, unspecified: Secondary | ICD-10-CM | POA: Diagnosis not present

## 2020-05-28 DIAGNOSIS — D631 Anemia in chronic kidney disease: Secondary | ICD-10-CM | POA: Diagnosis not present

## 2020-05-29 DIAGNOSIS — D509 Iron deficiency anemia, unspecified: Secondary | ICD-10-CM | POA: Diagnosis not present

## 2020-05-29 DIAGNOSIS — N186 End stage renal disease: Secondary | ICD-10-CM | POA: Diagnosis not present

## 2020-05-29 DIAGNOSIS — N2581 Secondary hyperparathyroidism of renal origin: Secondary | ICD-10-CM | POA: Diagnosis not present

## 2020-05-29 DIAGNOSIS — D631 Anemia in chronic kidney disease: Secondary | ICD-10-CM | POA: Diagnosis not present

## 2020-05-30 DIAGNOSIS — N2581 Secondary hyperparathyroidism of renal origin: Secondary | ICD-10-CM | POA: Diagnosis not present

## 2020-05-30 DIAGNOSIS — D509 Iron deficiency anemia, unspecified: Secondary | ICD-10-CM | POA: Diagnosis not present

## 2020-05-30 DIAGNOSIS — D631 Anemia in chronic kidney disease: Secondary | ICD-10-CM | POA: Diagnosis not present

## 2020-05-30 DIAGNOSIS — N186 End stage renal disease: Secondary | ICD-10-CM | POA: Diagnosis not present

## 2020-05-31 DIAGNOSIS — D631 Anemia in chronic kidney disease: Secondary | ICD-10-CM | POA: Diagnosis not present

## 2020-05-31 DIAGNOSIS — D509 Iron deficiency anemia, unspecified: Secondary | ICD-10-CM | POA: Diagnosis not present

## 2020-05-31 DIAGNOSIS — N2581 Secondary hyperparathyroidism of renal origin: Secondary | ICD-10-CM | POA: Diagnosis not present

## 2020-05-31 DIAGNOSIS — N186 End stage renal disease: Secondary | ICD-10-CM | POA: Diagnosis not present

## 2020-06-01 DIAGNOSIS — D509 Iron deficiency anemia, unspecified: Secondary | ICD-10-CM | POA: Diagnosis not present

## 2020-06-01 DIAGNOSIS — D631 Anemia in chronic kidney disease: Secondary | ICD-10-CM | POA: Diagnosis not present

## 2020-06-01 DIAGNOSIS — N186 End stage renal disease: Secondary | ICD-10-CM | POA: Diagnosis not present

## 2020-06-01 DIAGNOSIS — N2581 Secondary hyperparathyroidism of renal origin: Secondary | ICD-10-CM | POA: Diagnosis not present

## 2020-06-02 DIAGNOSIS — N2581 Secondary hyperparathyroidism of renal origin: Secondary | ICD-10-CM | POA: Diagnosis not present

## 2020-06-02 DIAGNOSIS — D509 Iron deficiency anemia, unspecified: Secondary | ICD-10-CM | POA: Diagnosis not present

## 2020-06-02 DIAGNOSIS — N186 End stage renal disease: Secondary | ICD-10-CM | POA: Diagnosis not present

## 2020-06-02 DIAGNOSIS — D631 Anemia in chronic kidney disease: Secondary | ICD-10-CM | POA: Diagnosis not present

## 2020-06-03 DIAGNOSIS — N2581 Secondary hyperparathyroidism of renal origin: Secondary | ICD-10-CM | POA: Diagnosis not present

## 2020-06-03 DIAGNOSIS — D509 Iron deficiency anemia, unspecified: Secondary | ICD-10-CM | POA: Diagnosis not present

## 2020-06-03 DIAGNOSIS — D631 Anemia in chronic kidney disease: Secondary | ICD-10-CM | POA: Diagnosis not present

## 2020-06-03 DIAGNOSIS — N186 End stage renal disease: Secondary | ICD-10-CM | POA: Diagnosis not present

## 2020-06-04 DIAGNOSIS — D509 Iron deficiency anemia, unspecified: Secondary | ICD-10-CM | POA: Diagnosis not present

## 2020-06-04 DIAGNOSIS — N186 End stage renal disease: Secondary | ICD-10-CM | POA: Diagnosis not present

## 2020-06-04 DIAGNOSIS — N2581 Secondary hyperparathyroidism of renal origin: Secondary | ICD-10-CM | POA: Diagnosis not present

## 2020-06-04 DIAGNOSIS — D631 Anemia in chronic kidney disease: Secondary | ICD-10-CM | POA: Diagnosis not present

## 2020-06-05 DIAGNOSIS — D631 Anemia in chronic kidney disease: Secondary | ICD-10-CM | POA: Diagnosis not present

## 2020-06-05 DIAGNOSIS — D509 Iron deficiency anemia, unspecified: Secondary | ICD-10-CM | POA: Diagnosis not present

## 2020-06-05 DIAGNOSIS — N186 End stage renal disease: Secondary | ICD-10-CM | POA: Diagnosis not present

## 2020-06-05 DIAGNOSIS — N2581 Secondary hyperparathyroidism of renal origin: Secondary | ICD-10-CM | POA: Diagnosis not present

## 2020-06-06 DIAGNOSIS — D509 Iron deficiency anemia, unspecified: Secondary | ICD-10-CM | POA: Diagnosis not present

## 2020-06-06 DIAGNOSIS — N2581 Secondary hyperparathyroidism of renal origin: Secondary | ICD-10-CM | POA: Diagnosis not present

## 2020-06-06 DIAGNOSIS — N186 End stage renal disease: Secondary | ICD-10-CM | POA: Diagnosis not present

## 2020-06-06 DIAGNOSIS — D631 Anemia in chronic kidney disease: Secondary | ICD-10-CM | POA: Diagnosis not present

## 2020-06-07 DIAGNOSIS — D509 Iron deficiency anemia, unspecified: Secondary | ICD-10-CM | POA: Diagnosis not present

## 2020-06-07 DIAGNOSIS — N186 End stage renal disease: Secondary | ICD-10-CM | POA: Diagnosis not present

## 2020-06-07 DIAGNOSIS — N2581 Secondary hyperparathyroidism of renal origin: Secondary | ICD-10-CM | POA: Diagnosis not present

## 2020-06-07 DIAGNOSIS — D631 Anemia in chronic kidney disease: Secondary | ICD-10-CM | POA: Diagnosis not present

## 2020-06-08 DIAGNOSIS — D631 Anemia in chronic kidney disease: Secondary | ICD-10-CM | POA: Diagnosis not present

## 2020-06-08 DIAGNOSIS — N2581 Secondary hyperparathyroidism of renal origin: Secondary | ICD-10-CM | POA: Diagnosis not present

## 2020-06-08 DIAGNOSIS — N186 End stage renal disease: Secondary | ICD-10-CM | POA: Diagnosis not present

## 2020-06-08 DIAGNOSIS — D509 Iron deficiency anemia, unspecified: Secondary | ICD-10-CM | POA: Diagnosis not present

## 2020-06-09 DIAGNOSIS — N2581 Secondary hyperparathyroidism of renal origin: Secondary | ICD-10-CM | POA: Diagnosis not present

## 2020-06-09 DIAGNOSIS — D631 Anemia in chronic kidney disease: Secondary | ICD-10-CM | POA: Diagnosis not present

## 2020-06-09 DIAGNOSIS — N186 End stage renal disease: Secondary | ICD-10-CM | POA: Diagnosis not present

## 2020-06-09 DIAGNOSIS — D509 Iron deficiency anemia, unspecified: Secondary | ICD-10-CM | POA: Diagnosis not present

## 2020-06-10 DIAGNOSIS — D631 Anemia in chronic kidney disease: Secondary | ICD-10-CM | POA: Diagnosis not present

## 2020-06-10 DIAGNOSIS — N2581 Secondary hyperparathyroidism of renal origin: Secondary | ICD-10-CM | POA: Diagnosis not present

## 2020-06-10 DIAGNOSIS — D509 Iron deficiency anemia, unspecified: Secondary | ICD-10-CM | POA: Diagnosis not present

## 2020-06-10 DIAGNOSIS — N186 End stage renal disease: Secondary | ICD-10-CM | POA: Diagnosis not present

## 2020-06-11 DIAGNOSIS — N186 End stage renal disease: Secondary | ICD-10-CM | POA: Diagnosis not present

## 2020-06-11 DIAGNOSIS — D509 Iron deficiency anemia, unspecified: Secondary | ICD-10-CM | POA: Diagnosis not present

## 2020-06-11 DIAGNOSIS — N2581 Secondary hyperparathyroidism of renal origin: Secondary | ICD-10-CM | POA: Diagnosis not present

## 2020-06-11 DIAGNOSIS — D631 Anemia in chronic kidney disease: Secondary | ICD-10-CM | POA: Diagnosis not present

## 2020-06-12 DIAGNOSIS — D631 Anemia in chronic kidney disease: Secondary | ICD-10-CM | POA: Diagnosis not present

## 2020-06-12 DIAGNOSIS — D509 Iron deficiency anemia, unspecified: Secondary | ICD-10-CM | POA: Diagnosis not present

## 2020-06-12 DIAGNOSIS — N2581 Secondary hyperparathyroidism of renal origin: Secondary | ICD-10-CM | POA: Diagnosis not present

## 2020-06-12 DIAGNOSIS — N186 End stage renal disease: Secondary | ICD-10-CM | POA: Diagnosis not present

## 2020-06-13 DIAGNOSIS — D631 Anemia in chronic kidney disease: Secondary | ICD-10-CM | POA: Diagnosis not present

## 2020-06-13 DIAGNOSIS — N2581 Secondary hyperparathyroidism of renal origin: Secondary | ICD-10-CM | POA: Diagnosis not present

## 2020-06-13 DIAGNOSIS — N186 End stage renal disease: Secondary | ICD-10-CM | POA: Diagnosis not present

## 2020-06-13 DIAGNOSIS — D509 Iron deficiency anemia, unspecified: Secondary | ICD-10-CM | POA: Diagnosis not present

## 2020-06-14 DIAGNOSIS — Z1159 Encounter for screening for other viral diseases: Secondary | ICD-10-CM | POA: Diagnosis not present

## 2020-06-14 DIAGNOSIS — D509 Iron deficiency anemia, unspecified: Secondary | ICD-10-CM | POA: Diagnosis not present

## 2020-06-14 DIAGNOSIS — N2581 Secondary hyperparathyroidism of renal origin: Secondary | ICD-10-CM | POA: Diagnosis not present

## 2020-06-14 DIAGNOSIS — D631 Anemia in chronic kidney disease: Secondary | ICD-10-CM | POA: Diagnosis not present

## 2020-06-14 DIAGNOSIS — N186 End stage renal disease: Secondary | ICD-10-CM | POA: Diagnosis not present

## 2020-06-14 DIAGNOSIS — Z4932 Encounter for adequacy testing for peritoneal dialysis: Secondary | ICD-10-CM | POA: Diagnosis not present

## 2020-06-15 DIAGNOSIS — N2581 Secondary hyperparathyroidism of renal origin: Secondary | ICD-10-CM | POA: Diagnosis not present

## 2020-06-15 DIAGNOSIS — N186 End stage renal disease: Secondary | ICD-10-CM | POA: Diagnosis not present

## 2020-06-15 DIAGNOSIS — D509 Iron deficiency anemia, unspecified: Secondary | ICD-10-CM | POA: Diagnosis not present

## 2020-06-15 DIAGNOSIS — D631 Anemia in chronic kidney disease: Secondary | ICD-10-CM | POA: Diagnosis not present

## 2020-06-16 DIAGNOSIS — D509 Iron deficiency anemia, unspecified: Secondary | ICD-10-CM | POA: Diagnosis not present

## 2020-06-16 DIAGNOSIS — N2581 Secondary hyperparathyroidism of renal origin: Secondary | ICD-10-CM | POA: Diagnosis not present

## 2020-06-16 DIAGNOSIS — N186 End stage renal disease: Secondary | ICD-10-CM | POA: Diagnosis not present

## 2020-06-16 DIAGNOSIS — D631 Anemia in chronic kidney disease: Secondary | ICD-10-CM | POA: Diagnosis not present

## 2020-06-17 DIAGNOSIS — N2581 Secondary hyperparathyroidism of renal origin: Secondary | ICD-10-CM | POA: Diagnosis not present

## 2020-06-17 DIAGNOSIS — D631 Anemia in chronic kidney disease: Secondary | ICD-10-CM | POA: Diagnosis not present

## 2020-06-17 DIAGNOSIS — D509 Iron deficiency anemia, unspecified: Secondary | ICD-10-CM | POA: Diagnosis not present

## 2020-06-17 DIAGNOSIS — N186 End stage renal disease: Secondary | ICD-10-CM | POA: Diagnosis not present

## 2020-06-18 DIAGNOSIS — D509 Iron deficiency anemia, unspecified: Secondary | ICD-10-CM | POA: Diagnosis not present

## 2020-06-18 DIAGNOSIS — N2581 Secondary hyperparathyroidism of renal origin: Secondary | ICD-10-CM | POA: Diagnosis not present

## 2020-06-18 DIAGNOSIS — N186 End stage renal disease: Secondary | ICD-10-CM | POA: Diagnosis not present

## 2020-06-18 DIAGNOSIS — D631 Anemia in chronic kidney disease: Secondary | ICD-10-CM | POA: Diagnosis not present

## 2020-06-19 DIAGNOSIS — N2581 Secondary hyperparathyroidism of renal origin: Secondary | ICD-10-CM | POA: Diagnosis not present

## 2020-06-19 DIAGNOSIS — N186 End stage renal disease: Secondary | ICD-10-CM | POA: Diagnosis not present

## 2020-06-19 DIAGNOSIS — D631 Anemia in chronic kidney disease: Secondary | ICD-10-CM | POA: Diagnosis not present

## 2020-06-19 DIAGNOSIS — D509 Iron deficiency anemia, unspecified: Secondary | ICD-10-CM | POA: Diagnosis not present

## 2020-06-20 DIAGNOSIS — N186 End stage renal disease: Secondary | ICD-10-CM | POA: Diagnosis not present

## 2020-06-20 DIAGNOSIS — D509 Iron deficiency anemia, unspecified: Secondary | ICD-10-CM | POA: Diagnosis not present

## 2020-06-20 DIAGNOSIS — D631 Anemia in chronic kidney disease: Secondary | ICD-10-CM | POA: Diagnosis not present

## 2020-06-20 DIAGNOSIS — N2581 Secondary hyperparathyroidism of renal origin: Secondary | ICD-10-CM | POA: Diagnosis not present

## 2020-06-21 DIAGNOSIS — N2581 Secondary hyperparathyroidism of renal origin: Secondary | ICD-10-CM | POA: Diagnosis not present

## 2020-06-21 DIAGNOSIS — N186 End stage renal disease: Secondary | ICD-10-CM | POA: Diagnosis not present

## 2020-06-21 DIAGNOSIS — D509 Iron deficiency anemia, unspecified: Secondary | ICD-10-CM | POA: Diagnosis not present

## 2020-06-21 DIAGNOSIS — D631 Anemia in chronic kidney disease: Secondary | ICD-10-CM | POA: Diagnosis not present

## 2020-06-22 DIAGNOSIS — N2581 Secondary hyperparathyroidism of renal origin: Secondary | ICD-10-CM | POA: Diagnosis not present

## 2020-06-22 DIAGNOSIS — D631 Anemia in chronic kidney disease: Secondary | ICD-10-CM | POA: Diagnosis not present

## 2020-06-22 DIAGNOSIS — D509 Iron deficiency anemia, unspecified: Secondary | ICD-10-CM | POA: Diagnosis not present

## 2020-06-22 DIAGNOSIS — N186 End stage renal disease: Secondary | ICD-10-CM | POA: Diagnosis not present

## 2020-06-23 DIAGNOSIS — Z992 Dependence on renal dialysis: Secondary | ICD-10-CM | POA: Diagnosis not present

## 2020-06-23 DIAGNOSIS — D631 Anemia in chronic kidney disease: Secondary | ICD-10-CM | POA: Diagnosis not present

## 2020-06-23 DIAGNOSIS — N2581 Secondary hyperparathyroidism of renal origin: Secondary | ICD-10-CM | POA: Diagnosis not present

## 2020-06-23 DIAGNOSIS — N186 End stage renal disease: Secondary | ICD-10-CM | POA: Diagnosis not present

## 2020-06-23 DIAGNOSIS — D509 Iron deficiency anemia, unspecified: Secondary | ICD-10-CM | POA: Diagnosis not present

## 2020-06-24 DIAGNOSIS — D631 Anemia in chronic kidney disease: Secondary | ICD-10-CM | POA: Diagnosis not present

## 2020-06-24 DIAGNOSIS — N186 End stage renal disease: Secondary | ICD-10-CM | POA: Diagnosis not present

## 2020-06-24 DIAGNOSIS — N2581 Secondary hyperparathyroidism of renal origin: Secondary | ICD-10-CM | POA: Diagnosis not present

## 2020-06-25 DIAGNOSIS — N186 End stage renal disease: Secondary | ICD-10-CM | POA: Diagnosis not present

## 2020-06-25 DIAGNOSIS — N2581 Secondary hyperparathyroidism of renal origin: Secondary | ICD-10-CM | POA: Diagnosis not present

## 2020-06-25 DIAGNOSIS — D631 Anemia in chronic kidney disease: Secondary | ICD-10-CM | POA: Diagnosis not present

## 2020-06-26 DIAGNOSIS — N186 End stage renal disease: Secondary | ICD-10-CM | POA: Diagnosis not present

## 2020-06-26 DIAGNOSIS — D631 Anemia in chronic kidney disease: Secondary | ICD-10-CM | POA: Diagnosis not present

## 2020-06-26 DIAGNOSIS — N2581 Secondary hyperparathyroidism of renal origin: Secondary | ICD-10-CM | POA: Diagnosis not present

## 2020-06-27 DIAGNOSIS — N2581 Secondary hyperparathyroidism of renal origin: Secondary | ICD-10-CM | POA: Diagnosis not present

## 2020-06-27 DIAGNOSIS — N186 End stage renal disease: Secondary | ICD-10-CM | POA: Diagnosis not present

## 2020-06-27 DIAGNOSIS — D631 Anemia in chronic kidney disease: Secondary | ICD-10-CM | POA: Diagnosis not present

## 2020-06-28 DIAGNOSIS — D631 Anemia in chronic kidney disease: Secondary | ICD-10-CM | POA: Diagnosis not present

## 2020-06-28 DIAGNOSIS — N2581 Secondary hyperparathyroidism of renal origin: Secondary | ICD-10-CM | POA: Diagnosis not present

## 2020-06-28 DIAGNOSIS — N186 End stage renal disease: Secondary | ICD-10-CM | POA: Diagnosis not present

## 2020-06-29 DIAGNOSIS — D631 Anemia in chronic kidney disease: Secondary | ICD-10-CM | POA: Diagnosis not present

## 2020-06-29 DIAGNOSIS — N186 End stage renal disease: Secondary | ICD-10-CM | POA: Diagnosis not present

## 2020-06-29 DIAGNOSIS — N2581 Secondary hyperparathyroidism of renal origin: Secondary | ICD-10-CM | POA: Diagnosis not present

## 2020-06-30 DIAGNOSIS — N186 End stage renal disease: Secondary | ICD-10-CM | POA: Diagnosis not present

## 2020-06-30 DIAGNOSIS — D631 Anemia in chronic kidney disease: Secondary | ICD-10-CM | POA: Diagnosis not present

## 2020-06-30 DIAGNOSIS — N2581 Secondary hyperparathyroidism of renal origin: Secondary | ICD-10-CM | POA: Diagnosis not present

## 2020-07-01 DIAGNOSIS — N186 End stage renal disease: Secondary | ICD-10-CM | POA: Diagnosis not present

## 2020-07-01 DIAGNOSIS — N2581 Secondary hyperparathyroidism of renal origin: Secondary | ICD-10-CM | POA: Diagnosis not present

## 2020-07-01 DIAGNOSIS — D631 Anemia in chronic kidney disease: Secondary | ICD-10-CM | POA: Diagnosis not present

## 2020-07-02 DIAGNOSIS — N2581 Secondary hyperparathyroidism of renal origin: Secondary | ICD-10-CM | POA: Diagnosis not present

## 2020-07-02 DIAGNOSIS — N186 End stage renal disease: Secondary | ICD-10-CM | POA: Diagnosis not present

## 2020-07-02 DIAGNOSIS — D631 Anemia in chronic kidney disease: Secondary | ICD-10-CM | POA: Diagnosis not present

## 2020-07-03 DIAGNOSIS — N2581 Secondary hyperparathyroidism of renal origin: Secondary | ICD-10-CM | POA: Diagnosis not present

## 2020-07-03 DIAGNOSIS — N186 End stage renal disease: Secondary | ICD-10-CM | POA: Diagnosis not present

## 2020-07-03 DIAGNOSIS — D631 Anemia in chronic kidney disease: Secondary | ICD-10-CM | POA: Diagnosis not present

## 2020-07-04 DIAGNOSIS — N2581 Secondary hyperparathyroidism of renal origin: Secondary | ICD-10-CM | POA: Diagnosis not present

## 2020-07-04 DIAGNOSIS — N186 End stage renal disease: Secondary | ICD-10-CM | POA: Diagnosis not present

## 2020-07-04 DIAGNOSIS — D631 Anemia in chronic kidney disease: Secondary | ICD-10-CM | POA: Diagnosis not present

## 2020-07-05 DIAGNOSIS — N186 End stage renal disease: Secondary | ICD-10-CM | POA: Diagnosis not present

## 2020-07-05 DIAGNOSIS — D631 Anemia in chronic kidney disease: Secondary | ICD-10-CM | POA: Diagnosis not present

## 2020-07-05 DIAGNOSIS — N2581 Secondary hyperparathyroidism of renal origin: Secondary | ICD-10-CM | POA: Diagnosis not present

## 2020-07-06 DIAGNOSIS — D631 Anemia in chronic kidney disease: Secondary | ICD-10-CM | POA: Diagnosis not present

## 2020-07-06 DIAGNOSIS — N2581 Secondary hyperparathyroidism of renal origin: Secondary | ICD-10-CM | POA: Diagnosis not present

## 2020-07-06 DIAGNOSIS — N186 End stage renal disease: Secondary | ICD-10-CM | POA: Diagnosis not present

## 2020-07-07 DIAGNOSIS — N186 End stage renal disease: Secondary | ICD-10-CM | POA: Diagnosis not present

## 2020-07-07 DIAGNOSIS — N2581 Secondary hyperparathyroidism of renal origin: Secondary | ICD-10-CM | POA: Diagnosis not present

## 2020-07-07 DIAGNOSIS — D631 Anemia in chronic kidney disease: Secondary | ICD-10-CM | POA: Diagnosis not present

## 2020-07-08 DIAGNOSIS — D631 Anemia in chronic kidney disease: Secondary | ICD-10-CM | POA: Diagnosis not present

## 2020-07-08 DIAGNOSIS — N2581 Secondary hyperparathyroidism of renal origin: Secondary | ICD-10-CM | POA: Diagnosis not present

## 2020-07-08 DIAGNOSIS — N186 End stage renal disease: Secondary | ICD-10-CM | POA: Diagnosis not present

## 2020-07-09 DIAGNOSIS — N2581 Secondary hyperparathyroidism of renal origin: Secondary | ICD-10-CM | POA: Diagnosis not present

## 2020-07-09 DIAGNOSIS — N186 End stage renal disease: Secondary | ICD-10-CM | POA: Diagnosis not present

## 2020-07-09 DIAGNOSIS — D631 Anemia in chronic kidney disease: Secondary | ICD-10-CM | POA: Diagnosis not present

## 2020-07-10 DIAGNOSIS — N2581 Secondary hyperparathyroidism of renal origin: Secondary | ICD-10-CM | POA: Diagnosis not present

## 2020-07-10 DIAGNOSIS — D631 Anemia in chronic kidney disease: Secondary | ICD-10-CM | POA: Diagnosis not present

## 2020-07-10 DIAGNOSIS — N186 End stage renal disease: Secondary | ICD-10-CM | POA: Diagnosis not present

## 2020-07-11 DIAGNOSIS — D631 Anemia in chronic kidney disease: Secondary | ICD-10-CM | POA: Diagnosis not present

## 2020-07-11 DIAGNOSIS — N2581 Secondary hyperparathyroidism of renal origin: Secondary | ICD-10-CM | POA: Diagnosis not present

## 2020-07-11 DIAGNOSIS — N186 End stage renal disease: Secondary | ICD-10-CM | POA: Diagnosis not present

## 2020-07-12 DIAGNOSIS — N186 End stage renal disease: Secondary | ICD-10-CM | POA: Diagnosis not present

## 2020-07-12 DIAGNOSIS — N2581 Secondary hyperparathyroidism of renal origin: Secondary | ICD-10-CM | POA: Diagnosis not present

## 2020-07-12 DIAGNOSIS — D631 Anemia in chronic kidney disease: Secondary | ICD-10-CM | POA: Diagnosis not present

## 2020-07-13 DIAGNOSIS — N186 End stage renal disease: Secondary | ICD-10-CM | POA: Diagnosis not present

## 2020-07-13 DIAGNOSIS — D631 Anemia in chronic kidney disease: Secondary | ICD-10-CM | POA: Diagnosis not present

## 2020-07-13 DIAGNOSIS — N2581 Secondary hyperparathyroidism of renal origin: Secondary | ICD-10-CM | POA: Diagnosis not present

## 2020-07-14 DIAGNOSIS — D631 Anemia in chronic kidney disease: Secondary | ICD-10-CM | POA: Diagnosis not present

## 2020-07-14 DIAGNOSIS — N186 End stage renal disease: Secondary | ICD-10-CM | POA: Diagnosis not present

## 2020-07-14 DIAGNOSIS — N2581 Secondary hyperparathyroidism of renal origin: Secondary | ICD-10-CM | POA: Diagnosis not present

## 2020-07-15 DIAGNOSIS — N2581 Secondary hyperparathyroidism of renal origin: Secondary | ICD-10-CM | POA: Diagnosis not present

## 2020-07-15 DIAGNOSIS — N186 End stage renal disease: Secondary | ICD-10-CM | POA: Diagnosis not present

## 2020-07-15 DIAGNOSIS — D631 Anemia in chronic kidney disease: Secondary | ICD-10-CM | POA: Diagnosis not present

## 2020-07-16 DIAGNOSIS — N186 End stage renal disease: Secondary | ICD-10-CM | POA: Diagnosis not present

## 2020-07-16 DIAGNOSIS — D631 Anemia in chronic kidney disease: Secondary | ICD-10-CM | POA: Diagnosis not present

## 2020-07-16 DIAGNOSIS — N2581 Secondary hyperparathyroidism of renal origin: Secondary | ICD-10-CM | POA: Diagnosis not present

## 2020-07-17 DIAGNOSIS — N2581 Secondary hyperparathyroidism of renal origin: Secondary | ICD-10-CM | POA: Diagnosis not present

## 2020-07-17 DIAGNOSIS — D631 Anemia in chronic kidney disease: Secondary | ICD-10-CM | POA: Diagnosis not present

## 2020-07-17 DIAGNOSIS — N186 End stage renal disease: Secondary | ICD-10-CM | POA: Diagnosis not present

## 2020-07-18 DIAGNOSIS — N186 End stage renal disease: Secondary | ICD-10-CM | POA: Diagnosis not present

## 2020-07-18 DIAGNOSIS — D631 Anemia in chronic kidney disease: Secondary | ICD-10-CM | POA: Diagnosis not present

## 2020-07-18 DIAGNOSIS — N2581 Secondary hyperparathyroidism of renal origin: Secondary | ICD-10-CM | POA: Diagnosis not present

## 2020-07-19 DIAGNOSIS — D631 Anemia in chronic kidney disease: Secondary | ICD-10-CM | POA: Diagnosis not present

## 2020-07-19 DIAGNOSIS — N186 End stage renal disease: Secondary | ICD-10-CM | POA: Diagnosis not present

## 2020-07-19 DIAGNOSIS — N2581 Secondary hyperparathyroidism of renal origin: Secondary | ICD-10-CM | POA: Diagnosis not present

## 2020-07-20 DIAGNOSIS — N186 End stage renal disease: Secondary | ICD-10-CM | POA: Diagnosis not present

## 2020-07-20 DIAGNOSIS — D631 Anemia in chronic kidney disease: Secondary | ICD-10-CM | POA: Diagnosis not present

## 2020-07-20 DIAGNOSIS — N2581 Secondary hyperparathyroidism of renal origin: Secondary | ICD-10-CM | POA: Diagnosis not present

## 2020-07-21 DIAGNOSIS — D631 Anemia in chronic kidney disease: Secondary | ICD-10-CM | POA: Diagnosis not present

## 2020-07-21 DIAGNOSIS — N186 End stage renal disease: Secondary | ICD-10-CM | POA: Diagnosis not present

## 2020-07-21 DIAGNOSIS — N2581 Secondary hyperparathyroidism of renal origin: Secondary | ICD-10-CM | POA: Diagnosis not present

## 2020-07-22 DIAGNOSIS — D631 Anemia in chronic kidney disease: Secondary | ICD-10-CM | POA: Diagnosis not present

## 2020-07-22 DIAGNOSIS — N186 End stage renal disease: Secondary | ICD-10-CM | POA: Diagnosis not present

## 2020-07-22 DIAGNOSIS — N2581 Secondary hyperparathyroidism of renal origin: Secondary | ICD-10-CM | POA: Diagnosis not present

## 2020-07-23 DIAGNOSIS — N186 End stage renal disease: Secondary | ICD-10-CM | POA: Diagnosis not present

## 2020-07-23 DIAGNOSIS — D631 Anemia in chronic kidney disease: Secondary | ICD-10-CM | POA: Diagnosis not present

## 2020-07-23 DIAGNOSIS — N2581 Secondary hyperparathyroidism of renal origin: Secondary | ICD-10-CM | POA: Diagnosis not present

## 2020-07-24 DIAGNOSIS — N2581 Secondary hyperparathyroidism of renal origin: Secondary | ICD-10-CM | POA: Diagnosis not present

## 2020-07-24 DIAGNOSIS — D631 Anemia in chronic kidney disease: Secondary | ICD-10-CM | POA: Diagnosis not present

## 2020-07-24 DIAGNOSIS — N186 End stage renal disease: Secondary | ICD-10-CM | POA: Diagnosis not present

## 2020-07-25 DIAGNOSIS — N186 End stage renal disease: Secondary | ICD-10-CM | POA: Diagnosis not present

## 2020-07-25 DIAGNOSIS — N2581 Secondary hyperparathyroidism of renal origin: Secondary | ICD-10-CM | POA: Diagnosis not present

## 2020-07-25 DIAGNOSIS — D631 Anemia in chronic kidney disease: Secondary | ICD-10-CM | POA: Diagnosis not present

## 2020-07-26 DIAGNOSIS — N186 End stage renal disease: Secondary | ICD-10-CM | POA: Diagnosis not present

## 2020-07-26 DIAGNOSIS — N2581 Secondary hyperparathyroidism of renal origin: Secondary | ICD-10-CM | POA: Diagnosis not present

## 2020-07-26 DIAGNOSIS — D631 Anemia in chronic kidney disease: Secondary | ICD-10-CM | POA: Diagnosis not present

## 2020-07-27 DIAGNOSIS — N186 End stage renal disease: Secondary | ICD-10-CM | POA: Diagnosis not present

## 2020-07-27 DIAGNOSIS — D631 Anemia in chronic kidney disease: Secondary | ICD-10-CM | POA: Diagnosis not present

## 2020-07-27 DIAGNOSIS — N2581 Secondary hyperparathyroidism of renal origin: Secondary | ICD-10-CM | POA: Diagnosis not present

## 2020-07-28 DIAGNOSIS — N2581 Secondary hyperparathyroidism of renal origin: Secondary | ICD-10-CM | POA: Diagnosis not present

## 2020-07-28 DIAGNOSIS — N186 End stage renal disease: Secondary | ICD-10-CM | POA: Diagnosis not present

## 2020-07-28 DIAGNOSIS — I10 Essential (primary) hypertension: Secondary | ICD-10-CM | POA: Diagnosis not present

## 2020-07-28 DIAGNOSIS — E119 Type 2 diabetes mellitus without complications: Secondary | ICD-10-CM | POA: Diagnosis not present

## 2020-07-28 DIAGNOSIS — D631 Anemia in chronic kidney disease: Secondary | ICD-10-CM | POA: Diagnosis not present

## 2020-07-29 DIAGNOSIS — N186 End stage renal disease: Secondary | ICD-10-CM | POA: Diagnosis not present

## 2020-07-29 DIAGNOSIS — D631 Anemia in chronic kidney disease: Secondary | ICD-10-CM | POA: Diagnosis not present

## 2020-07-29 DIAGNOSIS — N2581 Secondary hyperparathyroidism of renal origin: Secondary | ICD-10-CM | POA: Diagnosis not present

## 2020-07-30 DIAGNOSIS — D631 Anemia in chronic kidney disease: Secondary | ICD-10-CM | POA: Diagnosis not present

## 2020-07-30 DIAGNOSIS — N2581 Secondary hyperparathyroidism of renal origin: Secondary | ICD-10-CM | POA: Diagnosis not present

## 2020-07-30 DIAGNOSIS — N186 End stage renal disease: Secondary | ICD-10-CM | POA: Diagnosis not present

## 2020-07-31 DIAGNOSIS — N2581 Secondary hyperparathyroidism of renal origin: Secondary | ICD-10-CM | POA: Diagnosis not present

## 2020-07-31 DIAGNOSIS — N186 End stage renal disease: Secondary | ICD-10-CM | POA: Diagnosis not present

## 2020-07-31 DIAGNOSIS — D631 Anemia in chronic kidney disease: Secondary | ICD-10-CM | POA: Diagnosis not present

## 2020-08-01 DIAGNOSIS — N186 End stage renal disease: Secondary | ICD-10-CM | POA: Diagnosis not present

## 2020-08-01 DIAGNOSIS — D631 Anemia in chronic kidney disease: Secondary | ICD-10-CM | POA: Diagnosis not present

## 2020-08-01 DIAGNOSIS — N2581 Secondary hyperparathyroidism of renal origin: Secondary | ICD-10-CM | POA: Diagnosis not present

## 2020-08-02 DIAGNOSIS — D631 Anemia in chronic kidney disease: Secondary | ICD-10-CM | POA: Diagnosis not present

## 2020-08-02 DIAGNOSIS — N2581 Secondary hyperparathyroidism of renal origin: Secondary | ICD-10-CM | POA: Diagnosis not present

## 2020-08-02 DIAGNOSIS — N186 End stage renal disease: Secondary | ICD-10-CM | POA: Diagnosis not present

## 2020-08-03 DIAGNOSIS — N2581 Secondary hyperparathyroidism of renal origin: Secondary | ICD-10-CM | POA: Diagnosis not present

## 2020-08-03 DIAGNOSIS — D631 Anemia in chronic kidney disease: Secondary | ICD-10-CM | POA: Diagnosis not present

## 2020-08-03 DIAGNOSIS — N186 End stage renal disease: Secondary | ICD-10-CM | POA: Diagnosis not present

## 2020-08-04 DIAGNOSIS — N186 End stage renal disease: Secondary | ICD-10-CM | POA: Diagnosis not present

## 2020-08-04 DIAGNOSIS — D631 Anemia in chronic kidney disease: Secondary | ICD-10-CM | POA: Diagnosis not present

## 2020-08-04 DIAGNOSIS — N2581 Secondary hyperparathyroidism of renal origin: Secondary | ICD-10-CM | POA: Diagnosis not present

## 2020-08-05 DIAGNOSIS — D631 Anemia in chronic kidney disease: Secondary | ICD-10-CM | POA: Diagnosis not present

## 2020-08-05 DIAGNOSIS — N186 End stage renal disease: Secondary | ICD-10-CM | POA: Diagnosis not present

## 2020-08-05 DIAGNOSIS — N2581 Secondary hyperparathyroidism of renal origin: Secondary | ICD-10-CM | POA: Diagnosis not present

## 2020-08-06 DIAGNOSIS — N186 End stage renal disease: Secondary | ICD-10-CM | POA: Diagnosis not present

## 2020-08-06 DIAGNOSIS — N2581 Secondary hyperparathyroidism of renal origin: Secondary | ICD-10-CM | POA: Diagnosis not present

## 2020-08-06 DIAGNOSIS — D631 Anemia in chronic kidney disease: Secondary | ICD-10-CM | POA: Diagnosis not present

## 2020-08-07 DIAGNOSIS — N186 End stage renal disease: Secondary | ICD-10-CM | POA: Diagnosis not present

## 2020-08-07 DIAGNOSIS — D631 Anemia in chronic kidney disease: Secondary | ICD-10-CM | POA: Diagnosis not present

## 2020-08-07 DIAGNOSIS — N2581 Secondary hyperparathyroidism of renal origin: Secondary | ICD-10-CM | POA: Diagnosis not present

## 2020-08-08 DIAGNOSIS — D631 Anemia in chronic kidney disease: Secondary | ICD-10-CM | POA: Diagnosis not present

## 2020-08-08 DIAGNOSIS — N186 End stage renal disease: Secondary | ICD-10-CM | POA: Diagnosis not present

## 2020-08-08 DIAGNOSIS — N2581 Secondary hyperparathyroidism of renal origin: Secondary | ICD-10-CM | POA: Diagnosis not present

## 2020-08-09 DIAGNOSIS — D631 Anemia in chronic kidney disease: Secondary | ICD-10-CM | POA: Diagnosis not present

## 2020-08-09 DIAGNOSIS — N2581 Secondary hyperparathyroidism of renal origin: Secondary | ICD-10-CM | POA: Diagnosis not present

## 2020-08-09 DIAGNOSIS — N186 End stage renal disease: Secondary | ICD-10-CM | POA: Diagnosis not present

## 2020-08-10 DIAGNOSIS — D631 Anemia in chronic kidney disease: Secondary | ICD-10-CM | POA: Diagnosis not present

## 2020-08-10 DIAGNOSIS — N2581 Secondary hyperparathyroidism of renal origin: Secondary | ICD-10-CM | POA: Diagnosis not present

## 2020-08-10 DIAGNOSIS — N186 End stage renal disease: Secondary | ICD-10-CM | POA: Diagnosis not present

## 2020-08-11 DIAGNOSIS — N186 End stage renal disease: Secondary | ICD-10-CM | POA: Diagnosis not present

## 2020-08-11 DIAGNOSIS — D631 Anemia in chronic kidney disease: Secondary | ICD-10-CM | POA: Diagnosis not present

## 2020-08-11 DIAGNOSIS — N2581 Secondary hyperparathyroidism of renal origin: Secondary | ICD-10-CM | POA: Diagnosis not present

## 2020-08-12 DIAGNOSIS — N186 End stage renal disease: Secondary | ICD-10-CM | POA: Diagnosis not present

## 2020-08-12 DIAGNOSIS — D631 Anemia in chronic kidney disease: Secondary | ICD-10-CM | POA: Diagnosis not present

## 2020-08-12 DIAGNOSIS — N2581 Secondary hyperparathyroidism of renal origin: Secondary | ICD-10-CM | POA: Diagnosis not present

## 2020-08-13 DIAGNOSIS — D631 Anemia in chronic kidney disease: Secondary | ICD-10-CM | POA: Diagnosis not present

## 2020-08-13 DIAGNOSIS — N186 End stage renal disease: Secondary | ICD-10-CM | POA: Diagnosis not present

## 2020-08-13 DIAGNOSIS — N2581 Secondary hyperparathyroidism of renal origin: Secondary | ICD-10-CM | POA: Diagnosis not present

## 2020-08-14 DIAGNOSIS — N186 End stage renal disease: Secondary | ICD-10-CM | POA: Diagnosis not present

## 2020-08-14 DIAGNOSIS — D631 Anemia in chronic kidney disease: Secondary | ICD-10-CM | POA: Diagnosis not present

## 2020-08-14 DIAGNOSIS — N2581 Secondary hyperparathyroidism of renal origin: Secondary | ICD-10-CM | POA: Diagnosis not present

## 2020-08-15 DIAGNOSIS — N186 End stage renal disease: Secondary | ICD-10-CM | POA: Diagnosis not present

## 2020-08-15 DIAGNOSIS — N2581 Secondary hyperparathyroidism of renal origin: Secondary | ICD-10-CM | POA: Diagnosis not present

## 2020-08-15 DIAGNOSIS — D631 Anemia in chronic kidney disease: Secondary | ICD-10-CM | POA: Diagnosis not present

## 2020-08-16 DIAGNOSIS — N2581 Secondary hyperparathyroidism of renal origin: Secondary | ICD-10-CM | POA: Diagnosis not present

## 2020-08-16 DIAGNOSIS — N186 End stage renal disease: Secondary | ICD-10-CM | POA: Diagnosis not present

## 2020-08-16 DIAGNOSIS — D631 Anemia in chronic kidney disease: Secondary | ICD-10-CM | POA: Diagnosis not present

## 2020-08-17 DIAGNOSIS — N2581 Secondary hyperparathyroidism of renal origin: Secondary | ICD-10-CM | POA: Diagnosis not present

## 2020-08-17 DIAGNOSIS — N186 End stage renal disease: Secondary | ICD-10-CM | POA: Diagnosis not present

## 2020-08-17 DIAGNOSIS — D631 Anemia in chronic kidney disease: Secondary | ICD-10-CM | POA: Diagnosis not present

## 2020-08-18 DIAGNOSIS — N2581 Secondary hyperparathyroidism of renal origin: Secondary | ICD-10-CM | POA: Diagnosis not present

## 2020-08-18 DIAGNOSIS — D631 Anemia in chronic kidney disease: Secondary | ICD-10-CM | POA: Diagnosis not present

## 2020-08-18 DIAGNOSIS — N186 End stage renal disease: Secondary | ICD-10-CM | POA: Diagnosis not present

## 2020-08-19 DIAGNOSIS — N186 End stage renal disease: Secondary | ICD-10-CM | POA: Diagnosis not present

## 2020-08-19 DIAGNOSIS — D631 Anemia in chronic kidney disease: Secondary | ICD-10-CM | POA: Diagnosis not present

## 2020-08-19 DIAGNOSIS — N2581 Secondary hyperparathyroidism of renal origin: Secondary | ICD-10-CM | POA: Diagnosis not present

## 2020-08-20 DIAGNOSIS — N186 End stage renal disease: Secondary | ICD-10-CM | POA: Diagnosis not present

## 2020-08-20 DIAGNOSIS — D631 Anemia in chronic kidney disease: Secondary | ICD-10-CM | POA: Diagnosis not present

## 2020-08-20 DIAGNOSIS — N2581 Secondary hyperparathyroidism of renal origin: Secondary | ICD-10-CM | POA: Diagnosis not present

## 2020-08-21 DIAGNOSIS — D631 Anemia in chronic kidney disease: Secondary | ICD-10-CM | POA: Diagnosis not present

## 2020-08-21 DIAGNOSIS — N186 End stage renal disease: Secondary | ICD-10-CM | POA: Diagnosis not present

## 2020-08-21 DIAGNOSIS — N2581 Secondary hyperparathyroidism of renal origin: Secondary | ICD-10-CM | POA: Diagnosis not present

## 2020-08-22 DIAGNOSIS — N2581 Secondary hyperparathyroidism of renal origin: Secondary | ICD-10-CM | POA: Diagnosis not present

## 2020-08-22 DIAGNOSIS — D631 Anemia in chronic kidney disease: Secondary | ICD-10-CM | POA: Diagnosis not present

## 2020-08-22 DIAGNOSIS — N186 End stage renal disease: Secondary | ICD-10-CM | POA: Diagnosis not present

## 2020-08-23 DIAGNOSIS — N2581 Secondary hyperparathyroidism of renal origin: Secondary | ICD-10-CM | POA: Diagnosis not present

## 2020-08-23 DIAGNOSIS — N186 End stage renal disease: Secondary | ICD-10-CM | POA: Diagnosis not present

## 2020-08-23 DIAGNOSIS — Z992 Dependence on renal dialysis: Secondary | ICD-10-CM | POA: Diagnosis not present

## 2020-08-23 DIAGNOSIS — D631 Anemia in chronic kidney disease: Secondary | ICD-10-CM | POA: Diagnosis not present

## 2020-08-24 DIAGNOSIS — D631 Anemia in chronic kidney disease: Secondary | ICD-10-CM | POA: Diagnosis not present

## 2020-08-24 DIAGNOSIS — N186 End stage renal disease: Secondary | ICD-10-CM | POA: Diagnosis not present

## 2020-08-24 DIAGNOSIS — N2581 Secondary hyperparathyroidism of renal origin: Secondary | ICD-10-CM | POA: Diagnosis not present

## 2020-08-25 DIAGNOSIS — D631 Anemia in chronic kidney disease: Secondary | ICD-10-CM | POA: Diagnosis not present

## 2020-08-25 DIAGNOSIS — N186 End stage renal disease: Secondary | ICD-10-CM | POA: Diagnosis not present

## 2020-08-25 DIAGNOSIS — N2581 Secondary hyperparathyroidism of renal origin: Secondary | ICD-10-CM | POA: Diagnosis not present

## 2020-08-25 DIAGNOSIS — E119 Type 2 diabetes mellitus without complications: Secondary | ICD-10-CM | POA: Diagnosis not present

## 2020-08-26 DIAGNOSIS — I10 Essential (primary) hypertension: Secondary | ICD-10-CM | POA: Diagnosis not present

## 2020-08-26 DIAGNOSIS — N2581 Secondary hyperparathyroidism of renal origin: Secondary | ICD-10-CM | POA: Diagnosis not present

## 2020-08-26 DIAGNOSIS — Z9114 Patient's other noncompliance with medication regimen: Secondary | ICD-10-CM | POA: Diagnosis not present

## 2020-08-26 DIAGNOSIS — D631 Anemia in chronic kidney disease: Secondary | ICD-10-CM | POA: Diagnosis not present

## 2020-08-26 DIAGNOSIS — E1165 Type 2 diabetes mellitus with hyperglycemia: Secondary | ICD-10-CM | POA: Diagnosis not present

## 2020-08-26 DIAGNOSIS — N186 End stage renal disease: Secondary | ICD-10-CM | POA: Diagnosis not present

## 2020-08-26 DIAGNOSIS — E785 Hyperlipidemia, unspecified: Secondary | ICD-10-CM | POA: Diagnosis not present

## 2020-08-27 DIAGNOSIS — N2581 Secondary hyperparathyroidism of renal origin: Secondary | ICD-10-CM | POA: Diagnosis not present

## 2020-08-27 DIAGNOSIS — D631 Anemia in chronic kidney disease: Secondary | ICD-10-CM | POA: Diagnosis not present

## 2020-08-27 DIAGNOSIS — N186 End stage renal disease: Secondary | ICD-10-CM | POA: Diagnosis not present

## 2020-08-28 DIAGNOSIS — D631 Anemia in chronic kidney disease: Secondary | ICD-10-CM | POA: Diagnosis not present

## 2020-08-28 DIAGNOSIS — N186 End stage renal disease: Secondary | ICD-10-CM | POA: Diagnosis not present

## 2020-08-28 DIAGNOSIS — N2581 Secondary hyperparathyroidism of renal origin: Secondary | ICD-10-CM | POA: Diagnosis not present

## 2020-08-29 DIAGNOSIS — N186 End stage renal disease: Secondary | ICD-10-CM | POA: Diagnosis not present

## 2020-08-29 DIAGNOSIS — D631 Anemia in chronic kidney disease: Secondary | ICD-10-CM | POA: Diagnosis not present

## 2020-08-29 DIAGNOSIS — N2581 Secondary hyperparathyroidism of renal origin: Secondary | ICD-10-CM | POA: Diagnosis not present

## 2020-08-30 DIAGNOSIS — N2581 Secondary hyperparathyroidism of renal origin: Secondary | ICD-10-CM | POA: Diagnosis not present

## 2020-08-30 DIAGNOSIS — D631 Anemia in chronic kidney disease: Secondary | ICD-10-CM | POA: Diagnosis not present

## 2020-08-30 DIAGNOSIS — N186 End stage renal disease: Secondary | ICD-10-CM | POA: Diagnosis not present

## 2020-08-31 DIAGNOSIS — N2581 Secondary hyperparathyroidism of renal origin: Secondary | ICD-10-CM | POA: Diagnosis not present

## 2020-08-31 DIAGNOSIS — N186 End stage renal disease: Secondary | ICD-10-CM | POA: Diagnosis not present

## 2020-08-31 DIAGNOSIS — D631 Anemia in chronic kidney disease: Secondary | ICD-10-CM | POA: Diagnosis not present

## 2020-09-01 DIAGNOSIS — N2581 Secondary hyperparathyroidism of renal origin: Secondary | ICD-10-CM | POA: Diagnosis not present

## 2020-09-01 DIAGNOSIS — N186 End stage renal disease: Secondary | ICD-10-CM | POA: Diagnosis not present

## 2020-09-01 DIAGNOSIS — D631 Anemia in chronic kidney disease: Secondary | ICD-10-CM | POA: Diagnosis not present

## 2020-09-02 DIAGNOSIS — N2581 Secondary hyperparathyroidism of renal origin: Secondary | ICD-10-CM | POA: Diagnosis not present

## 2020-09-02 DIAGNOSIS — N186 End stage renal disease: Secondary | ICD-10-CM | POA: Diagnosis not present

## 2020-09-02 DIAGNOSIS — D631 Anemia in chronic kidney disease: Secondary | ICD-10-CM | POA: Diagnosis not present

## 2020-09-03 DIAGNOSIS — N2581 Secondary hyperparathyroidism of renal origin: Secondary | ICD-10-CM | POA: Diagnosis not present

## 2020-09-03 DIAGNOSIS — D631 Anemia in chronic kidney disease: Secondary | ICD-10-CM | POA: Diagnosis not present

## 2020-09-03 DIAGNOSIS — N186 End stage renal disease: Secondary | ICD-10-CM | POA: Diagnosis not present

## 2020-09-04 DIAGNOSIS — N2581 Secondary hyperparathyroidism of renal origin: Secondary | ICD-10-CM | POA: Diagnosis not present

## 2020-09-04 DIAGNOSIS — D631 Anemia in chronic kidney disease: Secondary | ICD-10-CM | POA: Diagnosis not present

## 2020-09-04 DIAGNOSIS — N186 End stage renal disease: Secondary | ICD-10-CM | POA: Diagnosis not present

## 2020-09-05 DIAGNOSIS — N2581 Secondary hyperparathyroidism of renal origin: Secondary | ICD-10-CM | POA: Diagnosis not present

## 2020-09-05 DIAGNOSIS — D631 Anemia in chronic kidney disease: Secondary | ICD-10-CM | POA: Diagnosis not present

## 2020-09-05 DIAGNOSIS — N186 End stage renal disease: Secondary | ICD-10-CM | POA: Diagnosis not present

## 2020-09-06 DIAGNOSIS — D631 Anemia in chronic kidney disease: Secondary | ICD-10-CM | POA: Diagnosis not present

## 2020-09-06 DIAGNOSIS — N186 End stage renal disease: Secondary | ICD-10-CM | POA: Diagnosis not present

## 2020-09-06 DIAGNOSIS — N2581 Secondary hyperparathyroidism of renal origin: Secondary | ICD-10-CM | POA: Diagnosis not present

## 2020-09-07 DIAGNOSIS — N186 End stage renal disease: Secondary | ICD-10-CM | POA: Diagnosis not present

## 2020-09-07 DIAGNOSIS — N2581 Secondary hyperparathyroidism of renal origin: Secondary | ICD-10-CM | POA: Diagnosis not present

## 2020-09-07 DIAGNOSIS — D631 Anemia in chronic kidney disease: Secondary | ICD-10-CM | POA: Diagnosis not present

## 2020-09-08 DIAGNOSIS — N186 End stage renal disease: Secondary | ICD-10-CM | POA: Diagnosis not present

## 2020-09-08 DIAGNOSIS — D631 Anemia in chronic kidney disease: Secondary | ICD-10-CM | POA: Diagnosis not present

## 2020-09-08 DIAGNOSIS — N2581 Secondary hyperparathyroidism of renal origin: Secondary | ICD-10-CM | POA: Diagnosis not present

## 2020-09-09 DIAGNOSIS — N2581 Secondary hyperparathyroidism of renal origin: Secondary | ICD-10-CM | POA: Diagnosis not present

## 2020-09-09 DIAGNOSIS — D631 Anemia in chronic kidney disease: Secondary | ICD-10-CM | POA: Diagnosis not present

## 2020-09-09 DIAGNOSIS — N186 End stage renal disease: Secondary | ICD-10-CM | POA: Diagnosis not present

## 2020-09-10 DIAGNOSIS — N186 End stage renal disease: Secondary | ICD-10-CM | POA: Diagnosis not present

## 2020-09-10 DIAGNOSIS — D631 Anemia in chronic kidney disease: Secondary | ICD-10-CM | POA: Diagnosis not present

## 2020-09-10 DIAGNOSIS — N2581 Secondary hyperparathyroidism of renal origin: Secondary | ICD-10-CM | POA: Diagnosis not present

## 2020-09-11 DIAGNOSIS — D631 Anemia in chronic kidney disease: Secondary | ICD-10-CM | POA: Diagnosis not present

## 2020-09-11 DIAGNOSIS — N186 End stage renal disease: Secondary | ICD-10-CM | POA: Diagnosis not present

## 2020-09-11 DIAGNOSIS — N2581 Secondary hyperparathyroidism of renal origin: Secondary | ICD-10-CM | POA: Diagnosis not present

## 2020-09-12 DIAGNOSIS — N186 End stage renal disease: Secondary | ICD-10-CM | POA: Diagnosis not present

## 2020-09-12 DIAGNOSIS — N2581 Secondary hyperparathyroidism of renal origin: Secondary | ICD-10-CM | POA: Diagnosis not present

## 2020-09-12 DIAGNOSIS — D631 Anemia in chronic kidney disease: Secondary | ICD-10-CM | POA: Diagnosis not present

## 2020-09-13 DIAGNOSIS — N2581 Secondary hyperparathyroidism of renal origin: Secondary | ICD-10-CM | POA: Diagnosis not present

## 2020-09-13 DIAGNOSIS — N186 End stage renal disease: Secondary | ICD-10-CM | POA: Diagnosis not present

## 2020-09-13 DIAGNOSIS — D631 Anemia in chronic kidney disease: Secondary | ICD-10-CM | POA: Diagnosis not present

## 2020-09-14 DIAGNOSIS — N186 End stage renal disease: Secondary | ICD-10-CM | POA: Diagnosis not present

## 2020-09-14 DIAGNOSIS — D631 Anemia in chronic kidney disease: Secondary | ICD-10-CM | POA: Diagnosis not present

## 2020-09-14 DIAGNOSIS — N2581 Secondary hyperparathyroidism of renal origin: Secondary | ICD-10-CM | POA: Diagnosis not present

## 2020-09-15 DIAGNOSIS — N2581 Secondary hyperparathyroidism of renal origin: Secondary | ICD-10-CM | POA: Diagnosis not present

## 2020-09-15 DIAGNOSIS — N186 End stage renal disease: Secondary | ICD-10-CM | POA: Diagnosis not present

## 2020-09-15 DIAGNOSIS — D631 Anemia in chronic kidney disease: Secondary | ICD-10-CM | POA: Diagnosis not present

## 2020-09-16 DIAGNOSIS — N2581 Secondary hyperparathyroidism of renal origin: Secondary | ICD-10-CM | POA: Diagnosis not present

## 2020-09-16 DIAGNOSIS — N186 End stage renal disease: Secondary | ICD-10-CM | POA: Diagnosis not present

## 2020-09-16 DIAGNOSIS — D631 Anemia in chronic kidney disease: Secondary | ICD-10-CM | POA: Diagnosis not present

## 2020-09-17 DIAGNOSIS — N186 End stage renal disease: Secondary | ICD-10-CM | POA: Diagnosis not present

## 2020-09-17 DIAGNOSIS — N2581 Secondary hyperparathyroidism of renal origin: Secondary | ICD-10-CM | POA: Diagnosis not present

## 2020-09-17 DIAGNOSIS — D631 Anemia in chronic kidney disease: Secondary | ICD-10-CM | POA: Diagnosis not present

## 2020-09-18 DIAGNOSIS — D631 Anemia in chronic kidney disease: Secondary | ICD-10-CM | POA: Diagnosis not present

## 2020-09-18 DIAGNOSIS — N2581 Secondary hyperparathyroidism of renal origin: Secondary | ICD-10-CM | POA: Diagnosis not present

## 2020-09-18 DIAGNOSIS — N186 End stage renal disease: Secondary | ICD-10-CM | POA: Diagnosis not present

## 2020-09-19 DIAGNOSIS — N186 End stage renal disease: Secondary | ICD-10-CM | POA: Diagnosis not present

## 2020-09-19 DIAGNOSIS — N2581 Secondary hyperparathyroidism of renal origin: Secondary | ICD-10-CM | POA: Diagnosis not present

## 2020-09-19 DIAGNOSIS — D631 Anemia in chronic kidney disease: Secondary | ICD-10-CM | POA: Diagnosis not present

## 2020-09-20 DIAGNOSIS — N2581 Secondary hyperparathyroidism of renal origin: Secondary | ICD-10-CM | POA: Diagnosis not present

## 2020-09-20 DIAGNOSIS — D631 Anemia in chronic kidney disease: Secondary | ICD-10-CM | POA: Diagnosis not present

## 2020-09-20 DIAGNOSIS — N186 End stage renal disease: Secondary | ICD-10-CM | POA: Diagnosis not present

## 2020-09-21 DIAGNOSIS — D631 Anemia in chronic kidney disease: Secondary | ICD-10-CM | POA: Diagnosis not present

## 2020-09-21 DIAGNOSIS — N186 End stage renal disease: Secondary | ICD-10-CM | POA: Diagnosis not present

## 2020-09-21 DIAGNOSIS — N2581 Secondary hyperparathyroidism of renal origin: Secondary | ICD-10-CM | POA: Diagnosis not present

## 2020-09-22 DIAGNOSIS — N2581 Secondary hyperparathyroidism of renal origin: Secondary | ICD-10-CM | POA: Diagnosis not present

## 2020-09-22 DIAGNOSIS — Z992 Dependence on renal dialysis: Secondary | ICD-10-CM | POA: Diagnosis not present

## 2020-09-22 DIAGNOSIS — D631 Anemia in chronic kidney disease: Secondary | ICD-10-CM | POA: Diagnosis not present

## 2020-09-22 DIAGNOSIS — N186 End stage renal disease: Secondary | ICD-10-CM | POA: Diagnosis not present

## 2020-09-23 DIAGNOSIS — D631 Anemia in chronic kidney disease: Secondary | ICD-10-CM | POA: Diagnosis not present

## 2020-09-23 DIAGNOSIS — N186 End stage renal disease: Secondary | ICD-10-CM | POA: Diagnosis not present

## 2020-09-23 DIAGNOSIS — N2581 Secondary hyperparathyroidism of renal origin: Secondary | ICD-10-CM | POA: Diagnosis not present

## 2020-09-24 DIAGNOSIS — D631 Anemia in chronic kidney disease: Secondary | ICD-10-CM | POA: Diagnosis not present

## 2020-09-24 DIAGNOSIS — N186 End stage renal disease: Secondary | ICD-10-CM | POA: Diagnosis not present

## 2020-09-24 DIAGNOSIS — N2581 Secondary hyperparathyroidism of renal origin: Secondary | ICD-10-CM | POA: Diagnosis not present

## 2020-09-25 DIAGNOSIS — D631 Anemia in chronic kidney disease: Secondary | ICD-10-CM | POA: Diagnosis not present

## 2020-09-25 DIAGNOSIS — N2581 Secondary hyperparathyroidism of renal origin: Secondary | ICD-10-CM | POA: Diagnosis not present

## 2020-09-25 DIAGNOSIS — N186 End stage renal disease: Secondary | ICD-10-CM | POA: Diagnosis not present

## 2020-09-26 DIAGNOSIS — D631 Anemia in chronic kidney disease: Secondary | ICD-10-CM | POA: Diagnosis not present

## 2020-09-26 DIAGNOSIS — N2581 Secondary hyperparathyroidism of renal origin: Secondary | ICD-10-CM | POA: Diagnosis not present

## 2020-09-26 DIAGNOSIS — N186 End stage renal disease: Secondary | ICD-10-CM | POA: Diagnosis not present

## 2020-09-27 DIAGNOSIS — N2581 Secondary hyperparathyroidism of renal origin: Secondary | ICD-10-CM | POA: Diagnosis not present

## 2020-09-27 DIAGNOSIS — D631 Anemia in chronic kidney disease: Secondary | ICD-10-CM | POA: Diagnosis not present

## 2020-09-27 DIAGNOSIS — N186 End stage renal disease: Secondary | ICD-10-CM | POA: Diagnosis not present

## 2020-09-28 DIAGNOSIS — N186 End stage renal disease: Secondary | ICD-10-CM | POA: Diagnosis not present

## 2020-09-28 DIAGNOSIS — D631 Anemia in chronic kidney disease: Secondary | ICD-10-CM | POA: Diagnosis not present

## 2020-09-28 DIAGNOSIS — N2581 Secondary hyperparathyroidism of renal origin: Secondary | ICD-10-CM | POA: Diagnosis not present

## 2020-09-29 DIAGNOSIS — N186 End stage renal disease: Secondary | ICD-10-CM | POA: Diagnosis not present

## 2020-09-29 DIAGNOSIS — E119 Type 2 diabetes mellitus without complications: Secondary | ICD-10-CM | POA: Diagnosis not present

## 2020-09-29 DIAGNOSIS — N2581 Secondary hyperparathyroidism of renal origin: Secondary | ICD-10-CM | POA: Diagnosis not present

## 2020-09-29 DIAGNOSIS — Z114 Encounter for screening for human immunodeficiency virus [HIV]: Secondary | ICD-10-CM | POA: Diagnosis not present

## 2020-09-29 DIAGNOSIS — D631 Anemia in chronic kidney disease: Secondary | ICD-10-CM | POA: Diagnosis not present

## 2020-09-30 DIAGNOSIS — N186 End stage renal disease: Secondary | ICD-10-CM | POA: Diagnosis not present

## 2020-09-30 DIAGNOSIS — N2581 Secondary hyperparathyroidism of renal origin: Secondary | ICD-10-CM | POA: Diagnosis not present

## 2020-09-30 DIAGNOSIS — D631 Anemia in chronic kidney disease: Secondary | ICD-10-CM | POA: Diagnosis not present

## 2020-10-01 DIAGNOSIS — D631 Anemia in chronic kidney disease: Secondary | ICD-10-CM | POA: Diagnosis not present

## 2020-10-01 DIAGNOSIS — N2581 Secondary hyperparathyroidism of renal origin: Secondary | ICD-10-CM | POA: Diagnosis not present

## 2020-10-01 DIAGNOSIS — N186 End stage renal disease: Secondary | ICD-10-CM | POA: Diagnosis not present

## 2020-10-02 DIAGNOSIS — D631 Anemia in chronic kidney disease: Secondary | ICD-10-CM | POA: Diagnosis not present

## 2020-10-02 DIAGNOSIS — N2581 Secondary hyperparathyroidism of renal origin: Secondary | ICD-10-CM | POA: Diagnosis not present

## 2020-10-02 DIAGNOSIS — N186 End stage renal disease: Secondary | ICD-10-CM | POA: Diagnosis not present

## 2020-10-03 DIAGNOSIS — N2581 Secondary hyperparathyroidism of renal origin: Secondary | ICD-10-CM | POA: Diagnosis not present

## 2020-10-03 DIAGNOSIS — N186 End stage renal disease: Secondary | ICD-10-CM | POA: Diagnosis not present

## 2020-10-03 DIAGNOSIS — D631 Anemia in chronic kidney disease: Secondary | ICD-10-CM | POA: Diagnosis not present

## 2020-10-04 DIAGNOSIS — N2581 Secondary hyperparathyroidism of renal origin: Secondary | ICD-10-CM | POA: Diagnosis not present

## 2020-10-04 DIAGNOSIS — N186 End stage renal disease: Secondary | ICD-10-CM | POA: Diagnosis not present

## 2020-10-04 DIAGNOSIS — D631 Anemia in chronic kidney disease: Secondary | ICD-10-CM | POA: Diagnosis not present

## 2020-10-05 DIAGNOSIS — D631 Anemia in chronic kidney disease: Secondary | ICD-10-CM | POA: Diagnosis not present

## 2020-10-05 DIAGNOSIS — N2581 Secondary hyperparathyroidism of renal origin: Secondary | ICD-10-CM | POA: Diagnosis not present

## 2020-10-05 DIAGNOSIS — N186 End stage renal disease: Secondary | ICD-10-CM | POA: Diagnosis not present

## 2020-10-06 DIAGNOSIS — D631 Anemia in chronic kidney disease: Secondary | ICD-10-CM | POA: Diagnosis not present

## 2020-10-06 DIAGNOSIS — N186 End stage renal disease: Secondary | ICD-10-CM | POA: Diagnosis not present

## 2020-10-06 DIAGNOSIS — N2581 Secondary hyperparathyroidism of renal origin: Secondary | ICD-10-CM | POA: Diagnosis not present

## 2020-10-07 DIAGNOSIS — N186 End stage renal disease: Secondary | ICD-10-CM | POA: Diagnosis not present

## 2020-10-07 DIAGNOSIS — D631 Anemia in chronic kidney disease: Secondary | ICD-10-CM | POA: Diagnosis not present

## 2020-10-07 DIAGNOSIS — N2581 Secondary hyperparathyroidism of renal origin: Secondary | ICD-10-CM | POA: Diagnosis not present

## 2020-10-08 DIAGNOSIS — N186 End stage renal disease: Secondary | ICD-10-CM | POA: Diagnosis not present

## 2020-10-08 DIAGNOSIS — N2581 Secondary hyperparathyroidism of renal origin: Secondary | ICD-10-CM | POA: Diagnosis not present

## 2020-10-08 DIAGNOSIS — D631 Anemia in chronic kidney disease: Secondary | ICD-10-CM | POA: Diagnosis not present

## 2020-10-09 DIAGNOSIS — D631 Anemia in chronic kidney disease: Secondary | ICD-10-CM | POA: Diagnosis not present

## 2020-10-09 DIAGNOSIS — N2581 Secondary hyperparathyroidism of renal origin: Secondary | ICD-10-CM | POA: Diagnosis not present

## 2020-10-09 DIAGNOSIS — N186 End stage renal disease: Secondary | ICD-10-CM | POA: Diagnosis not present

## 2020-10-10 DIAGNOSIS — N186 End stage renal disease: Secondary | ICD-10-CM | POA: Diagnosis not present

## 2020-10-10 DIAGNOSIS — N2581 Secondary hyperparathyroidism of renal origin: Secondary | ICD-10-CM | POA: Diagnosis not present

## 2020-10-10 DIAGNOSIS — D631 Anemia in chronic kidney disease: Secondary | ICD-10-CM | POA: Diagnosis not present

## 2020-10-11 DIAGNOSIS — N186 End stage renal disease: Secondary | ICD-10-CM | POA: Diagnosis not present

## 2020-10-11 DIAGNOSIS — D631 Anemia in chronic kidney disease: Secondary | ICD-10-CM | POA: Diagnosis not present

## 2020-10-11 DIAGNOSIS — N2581 Secondary hyperparathyroidism of renal origin: Secondary | ICD-10-CM | POA: Diagnosis not present

## 2020-10-12 DIAGNOSIS — D631 Anemia in chronic kidney disease: Secondary | ICD-10-CM | POA: Diagnosis not present

## 2020-10-12 DIAGNOSIS — N2581 Secondary hyperparathyroidism of renal origin: Secondary | ICD-10-CM | POA: Diagnosis not present

## 2020-10-12 DIAGNOSIS — N186 End stage renal disease: Secondary | ICD-10-CM | POA: Diagnosis not present

## 2020-10-13 DIAGNOSIS — N2581 Secondary hyperparathyroidism of renal origin: Secondary | ICD-10-CM | POA: Diagnosis not present

## 2020-10-13 DIAGNOSIS — D631 Anemia in chronic kidney disease: Secondary | ICD-10-CM | POA: Diagnosis not present

## 2020-10-13 DIAGNOSIS — N186 End stage renal disease: Secondary | ICD-10-CM | POA: Diagnosis not present

## 2020-10-13 DIAGNOSIS — H2102 Hyphema, left eye: Secondary | ICD-10-CM | POA: Diagnosis not present

## 2020-10-13 DIAGNOSIS — T8522XA Displacement of intraocular lens, initial encounter: Secondary | ICD-10-CM | POA: Diagnosis not present

## 2020-10-14 DIAGNOSIS — N186 End stage renal disease: Secondary | ICD-10-CM | POA: Diagnosis not present

## 2020-10-14 DIAGNOSIS — D631 Anemia in chronic kidney disease: Secondary | ICD-10-CM | POA: Diagnosis not present

## 2020-10-14 DIAGNOSIS — N2581 Secondary hyperparathyroidism of renal origin: Secondary | ICD-10-CM | POA: Diagnosis not present

## 2020-10-15 DIAGNOSIS — D631 Anemia in chronic kidney disease: Secondary | ICD-10-CM | POA: Diagnosis not present

## 2020-10-15 DIAGNOSIS — N186 End stage renal disease: Secondary | ICD-10-CM | POA: Diagnosis not present

## 2020-10-15 DIAGNOSIS — N2581 Secondary hyperparathyroidism of renal origin: Secondary | ICD-10-CM | POA: Diagnosis not present

## 2020-10-16 DIAGNOSIS — N2581 Secondary hyperparathyroidism of renal origin: Secondary | ICD-10-CM | POA: Diagnosis not present

## 2020-10-16 DIAGNOSIS — D631 Anemia in chronic kidney disease: Secondary | ICD-10-CM | POA: Diagnosis not present

## 2020-10-16 DIAGNOSIS — N186 End stage renal disease: Secondary | ICD-10-CM | POA: Diagnosis not present

## 2020-10-17 DIAGNOSIS — N2581 Secondary hyperparathyroidism of renal origin: Secondary | ICD-10-CM | POA: Diagnosis not present

## 2020-10-17 DIAGNOSIS — N186 End stage renal disease: Secondary | ICD-10-CM | POA: Diagnosis not present

## 2020-10-17 DIAGNOSIS — D631 Anemia in chronic kidney disease: Secondary | ICD-10-CM | POA: Diagnosis not present

## 2020-10-18 DIAGNOSIS — N186 End stage renal disease: Secondary | ICD-10-CM | POA: Diagnosis not present

## 2020-10-18 DIAGNOSIS — N2581 Secondary hyperparathyroidism of renal origin: Secondary | ICD-10-CM | POA: Diagnosis not present

## 2020-10-18 DIAGNOSIS — D631 Anemia in chronic kidney disease: Secondary | ICD-10-CM | POA: Diagnosis not present

## 2020-10-19 DIAGNOSIS — N186 End stage renal disease: Secondary | ICD-10-CM | POA: Diagnosis not present

## 2020-10-19 DIAGNOSIS — N2581 Secondary hyperparathyroidism of renal origin: Secondary | ICD-10-CM | POA: Diagnosis not present

## 2020-10-19 DIAGNOSIS — D631 Anemia in chronic kidney disease: Secondary | ICD-10-CM | POA: Diagnosis not present

## 2020-10-20 DIAGNOSIS — N2581 Secondary hyperparathyroidism of renal origin: Secondary | ICD-10-CM | POA: Diagnosis not present

## 2020-10-20 DIAGNOSIS — N186 End stage renal disease: Secondary | ICD-10-CM | POA: Diagnosis not present

## 2020-10-20 DIAGNOSIS — D631 Anemia in chronic kidney disease: Secondary | ICD-10-CM | POA: Diagnosis not present

## 2020-10-21 DIAGNOSIS — N2581 Secondary hyperparathyroidism of renal origin: Secondary | ICD-10-CM | POA: Diagnosis not present

## 2020-10-21 DIAGNOSIS — D631 Anemia in chronic kidney disease: Secondary | ICD-10-CM | POA: Diagnosis not present

## 2020-10-21 DIAGNOSIS — N186 End stage renal disease: Secondary | ICD-10-CM | POA: Diagnosis not present

## 2020-10-22 DIAGNOSIS — N186 End stage renal disease: Secondary | ICD-10-CM | POA: Diagnosis not present

## 2020-10-22 DIAGNOSIS — D631 Anemia in chronic kidney disease: Secondary | ICD-10-CM | POA: Diagnosis not present

## 2020-10-22 DIAGNOSIS — N2581 Secondary hyperparathyroidism of renal origin: Secondary | ICD-10-CM | POA: Diagnosis not present

## 2020-10-23 DIAGNOSIS — N2581 Secondary hyperparathyroidism of renal origin: Secondary | ICD-10-CM | POA: Diagnosis not present

## 2020-10-23 DIAGNOSIS — D631 Anemia in chronic kidney disease: Secondary | ICD-10-CM | POA: Diagnosis not present

## 2020-10-23 DIAGNOSIS — Z992 Dependence on renal dialysis: Secondary | ICD-10-CM | POA: Diagnosis not present

## 2020-10-23 DIAGNOSIS — N186 End stage renal disease: Secondary | ICD-10-CM | POA: Diagnosis not present

## 2020-10-24 DIAGNOSIS — D631 Anemia in chronic kidney disease: Secondary | ICD-10-CM | POA: Diagnosis not present

## 2020-10-24 DIAGNOSIS — N2581 Secondary hyperparathyroidism of renal origin: Secondary | ICD-10-CM | POA: Diagnosis not present

## 2020-10-24 DIAGNOSIS — N186 End stage renal disease: Secondary | ICD-10-CM | POA: Diagnosis not present

## 2020-10-25 DIAGNOSIS — N186 End stage renal disease: Secondary | ICD-10-CM | POA: Diagnosis not present

## 2020-10-25 DIAGNOSIS — N2581 Secondary hyperparathyroidism of renal origin: Secondary | ICD-10-CM | POA: Diagnosis not present

## 2020-10-25 DIAGNOSIS — D631 Anemia in chronic kidney disease: Secondary | ICD-10-CM | POA: Diagnosis not present

## 2020-10-26 DIAGNOSIS — D631 Anemia in chronic kidney disease: Secondary | ICD-10-CM | POA: Diagnosis not present

## 2020-10-26 DIAGNOSIS — N186 End stage renal disease: Secondary | ICD-10-CM | POA: Diagnosis not present

## 2020-10-26 DIAGNOSIS — N2581 Secondary hyperparathyroidism of renal origin: Secondary | ICD-10-CM | POA: Diagnosis not present

## 2020-10-27 DIAGNOSIS — E119 Type 2 diabetes mellitus without complications: Secondary | ICD-10-CM | POA: Diagnosis not present

## 2020-10-27 DIAGNOSIS — D631 Anemia in chronic kidney disease: Secondary | ICD-10-CM | POA: Diagnosis not present

## 2020-10-27 DIAGNOSIS — N2581 Secondary hyperparathyroidism of renal origin: Secondary | ICD-10-CM | POA: Diagnosis not present

## 2020-10-27 DIAGNOSIS — N186 End stage renal disease: Secondary | ICD-10-CM | POA: Diagnosis not present

## 2020-10-28 DIAGNOSIS — D631 Anemia in chronic kidney disease: Secondary | ICD-10-CM | POA: Diagnosis not present

## 2020-10-28 DIAGNOSIS — N2581 Secondary hyperparathyroidism of renal origin: Secondary | ICD-10-CM | POA: Diagnosis not present

## 2020-10-28 DIAGNOSIS — N186 End stage renal disease: Secondary | ICD-10-CM | POA: Diagnosis not present

## 2020-10-29 DIAGNOSIS — N186 End stage renal disease: Secondary | ICD-10-CM | POA: Diagnosis not present

## 2020-10-29 DIAGNOSIS — N2581 Secondary hyperparathyroidism of renal origin: Secondary | ICD-10-CM | POA: Diagnosis not present

## 2020-10-29 DIAGNOSIS — D631 Anemia in chronic kidney disease: Secondary | ICD-10-CM | POA: Diagnosis not present

## 2020-10-30 DIAGNOSIS — D631 Anemia in chronic kidney disease: Secondary | ICD-10-CM | POA: Diagnosis not present

## 2020-10-30 DIAGNOSIS — N186 End stage renal disease: Secondary | ICD-10-CM | POA: Diagnosis not present

## 2020-10-30 DIAGNOSIS — N2581 Secondary hyperparathyroidism of renal origin: Secondary | ICD-10-CM | POA: Diagnosis not present

## 2020-10-31 DIAGNOSIS — D631 Anemia in chronic kidney disease: Secondary | ICD-10-CM | POA: Diagnosis not present

## 2020-10-31 DIAGNOSIS — N2581 Secondary hyperparathyroidism of renal origin: Secondary | ICD-10-CM | POA: Diagnosis not present

## 2020-10-31 DIAGNOSIS — N186 End stage renal disease: Secondary | ICD-10-CM | POA: Diagnosis not present

## 2020-11-01 DIAGNOSIS — N2581 Secondary hyperparathyroidism of renal origin: Secondary | ICD-10-CM | POA: Diagnosis not present

## 2020-11-01 DIAGNOSIS — D631 Anemia in chronic kidney disease: Secondary | ICD-10-CM | POA: Diagnosis not present

## 2020-11-01 DIAGNOSIS — N186 End stage renal disease: Secondary | ICD-10-CM | POA: Diagnosis not present

## 2020-11-02 DIAGNOSIS — D631 Anemia in chronic kidney disease: Secondary | ICD-10-CM | POA: Diagnosis not present

## 2020-11-02 DIAGNOSIS — N186 End stage renal disease: Secondary | ICD-10-CM | POA: Diagnosis not present

## 2020-11-02 DIAGNOSIS — N2581 Secondary hyperparathyroidism of renal origin: Secondary | ICD-10-CM | POA: Diagnosis not present

## 2020-11-03 DIAGNOSIS — N2581 Secondary hyperparathyroidism of renal origin: Secondary | ICD-10-CM | POA: Diagnosis not present

## 2020-11-03 DIAGNOSIS — D631 Anemia in chronic kidney disease: Secondary | ICD-10-CM | POA: Diagnosis not present

## 2020-11-03 DIAGNOSIS — N186 End stage renal disease: Secondary | ICD-10-CM | POA: Diagnosis not present

## 2020-11-04 DIAGNOSIS — D631 Anemia in chronic kidney disease: Secondary | ICD-10-CM | POA: Diagnosis not present

## 2020-11-04 DIAGNOSIS — N2581 Secondary hyperparathyroidism of renal origin: Secondary | ICD-10-CM | POA: Diagnosis not present

## 2020-11-04 DIAGNOSIS — N186 End stage renal disease: Secondary | ICD-10-CM | POA: Diagnosis not present

## 2020-11-05 DIAGNOSIS — N186 End stage renal disease: Secondary | ICD-10-CM | POA: Diagnosis not present

## 2020-11-05 DIAGNOSIS — D631 Anemia in chronic kidney disease: Secondary | ICD-10-CM | POA: Diagnosis not present

## 2020-11-05 DIAGNOSIS — N2581 Secondary hyperparathyroidism of renal origin: Secondary | ICD-10-CM | POA: Diagnosis not present

## 2020-11-06 DIAGNOSIS — N2581 Secondary hyperparathyroidism of renal origin: Secondary | ICD-10-CM | POA: Diagnosis not present

## 2020-11-06 DIAGNOSIS — D631 Anemia in chronic kidney disease: Secondary | ICD-10-CM | POA: Diagnosis not present

## 2020-11-06 DIAGNOSIS — N186 End stage renal disease: Secondary | ICD-10-CM | POA: Diagnosis not present

## 2020-11-07 DIAGNOSIS — D631 Anemia in chronic kidney disease: Secondary | ICD-10-CM | POA: Diagnosis not present

## 2020-11-07 DIAGNOSIS — N2581 Secondary hyperparathyroidism of renal origin: Secondary | ICD-10-CM | POA: Diagnosis not present

## 2020-11-07 DIAGNOSIS — N186 End stage renal disease: Secondary | ICD-10-CM | POA: Diagnosis not present

## 2020-11-08 DIAGNOSIS — N186 End stage renal disease: Secondary | ICD-10-CM | POA: Diagnosis not present

## 2020-11-08 DIAGNOSIS — N2581 Secondary hyperparathyroidism of renal origin: Secondary | ICD-10-CM | POA: Diagnosis not present

## 2020-11-08 DIAGNOSIS — D631 Anemia in chronic kidney disease: Secondary | ICD-10-CM | POA: Diagnosis not present

## 2020-11-09 DIAGNOSIS — N186 End stage renal disease: Secondary | ICD-10-CM | POA: Diagnosis not present

## 2020-11-09 DIAGNOSIS — D631 Anemia in chronic kidney disease: Secondary | ICD-10-CM | POA: Diagnosis not present

## 2020-11-09 DIAGNOSIS — N2581 Secondary hyperparathyroidism of renal origin: Secondary | ICD-10-CM | POA: Diagnosis not present

## 2020-11-10 DIAGNOSIS — D631 Anemia in chronic kidney disease: Secondary | ICD-10-CM | POA: Diagnosis not present

## 2020-11-10 DIAGNOSIS — N2581 Secondary hyperparathyroidism of renal origin: Secondary | ICD-10-CM | POA: Diagnosis not present

## 2020-11-10 DIAGNOSIS — N186 End stage renal disease: Secondary | ICD-10-CM | POA: Diagnosis not present

## 2020-11-11 DIAGNOSIS — N186 End stage renal disease: Secondary | ICD-10-CM | POA: Diagnosis not present

## 2020-11-11 DIAGNOSIS — D631 Anemia in chronic kidney disease: Secondary | ICD-10-CM | POA: Diagnosis not present

## 2020-11-11 DIAGNOSIS — N2581 Secondary hyperparathyroidism of renal origin: Secondary | ICD-10-CM | POA: Diagnosis not present

## 2020-11-12 DIAGNOSIS — N186 End stage renal disease: Secondary | ICD-10-CM | POA: Diagnosis not present

## 2020-11-12 DIAGNOSIS — L03811 Cellulitis of head [any part, except face]: Secondary | ICD-10-CM | POA: Diagnosis not present

## 2020-11-12 DIAGNOSIS — M7021 Olecranon bursitis, right elbow: Secondary | ICD-10-CM | POA: Diagnosis not present

## 2020-11-12 DIAGNOSIS — D631 Anemia in chronic kidney disease: Secondary | ICD-10-CM | POA: Diagnosis not present

## 2020-11-12 DIAGNOSIS — M79671 Pain in right foot: Secondary | ICD-10-CM | POA: Diagnosis not present

## 2020-11-12 DIAGNOSIS — N2581 Secondary hyperparathyroidism of renal origin: Secondary | ICD-10-CM | POA: Diagnosis not present

## 2020-11-13 DIAGNOSIS — N186 End stage renal disease: Secondary | ICD-10-CM | POA: Diagnosis not present

## 2020-11-13 DIAGNOSIS — N2581 Secondary hyperparathyroidism of renal origin: Secondary | ICD-10-CM | POA: Diagnosis not present

## 2020-11-13 DIAGNOSIS — D631 Anemia in chronic kidney disease: Secondary | ICD-10-CM | POA: Diagnosis not present

## 2020-11-14 DIAGNOSIS — N2581 Secondary hyperparathyroidism of renal origin: Secondary | ICD-10-CM | POA: Diagnosis not present

## 2020-11-14 DIAGNOSIS — N186 End stage renal disease: Secondary | ICD-10-CM | POA: Diagnosis not present

## 2020-11-14 DIAGNOSIS — D631 Anemia in chronic kidney disease: Secondary | ICD-10-CM | POA: Diagnosis not present

## 2020-11-15 DIAGNOSIS — D631 Anemia in chronic kidney disease: Secondary | ICD-10-CM | POA: Diagnosis not present

## 2020-11-15 DIAGNOSIS — N2581 Secondary hyperparathyroidism of renal origin: Secondary | ICD-10-CM | POA: Diagnosis not present

## 2020-11-15 DIAGNOSIS — N186 End stage renal disease: Secondary | ICD-10-CM | POA: Diagnosis not present

## 2020-11-16 DIAGNOSIS — N186 End stage renal disease: Secondary | ICD-10-CM | POA: Diagnosis not present

## 2020-11-16 DIAGNOSIS — N2581 Secondary hyperparathyroidism of renal origin: Secondary | ICD-10-CM | POA: Diagnosis not present

## 2020-11-16 DIAGNOSIS — D631 Anemia in chronic kidney disease: Secondary | ICD-10-CM | POA: Diagnosis not present

## 2020-11-17 DIAGNOSIS — N186 End stage renal disease: Secondary | ICD-10-CM | POA: Diagnosis not present

## 2020-11-17 DIAGNOSIS — D631 Anemia in chronic kidney disease: Secondary | ICD-10-CM | POA: Diagnosis not present

## 2020-11-17 DIAGNOSIS — N2581 Secondary hyperparathyroidism of renal origin: Secondary | ICD-10-CM | POA: Diagnosis not present

## 2020-11-18 DIAGNOSIS — N2581 Secondary hyperparathyroidism of renal origin: Secondary | ICD-10-CM | POA: Diagnosis not present

## 2020-11-18 DIAGNOSIS — D631 Anemia in chronic kidney disease: Secondary | ICD-10-CM | POA: Diagnosis not present

## 2020-11-18 DIAGNOSIS — N186 End stage renal disease: Secondary | ICD-10-CM | POA: Diagnosis not present

## 2020-11-19 DIAGNOSIS — N186 End stage renal disease: Secondary | ICD-10-CM | POA: Diagnosis not present

## 2020-11-19 DIAGNOSIS — D631 Anemia in chronic kidney disease: Secondary | ICD-10-CM | POA: Diagnosis not present

## 2020-11-19 DIAGNOSIS — N2581 Secondary hyperparathyroidism of renal origin: Secondary | ICD-10-CM | POA: Diagnosis not present

## 2020-11-20 DIAGNOSIS — N186 End stage renal disease: Secondary | ICD-10-CM | POA: Diagnosis not present

## 2020-11-20 DIAGNOSIS — D631 Anemia in chronic kidney disease: Secondary | ICD-10-CM | POA: Diagnosis not present

## 2020-11-20 DIAGNOSIS — N2581 Secondary hyperparathyroidism of renal origin: Secondary | ICD-10-CM | POA: Diagnosis not present

## 2020-11-20 DIAGNOSIS — M7021 Olecranon bursitis, right elbow: Secondary | ICD-10-CM | POA: Diagnosis not present

## 2020-11-21 DIAGNOSIS — N186 End stage renal disease: Secondary | ICD-10-CM | POA: Diagnosis not present

## 2020-11-21 DIAGNOSIS — D631 Anemia in chronic kidney disease: Secondary | ICD-10-CM | POA: Diagnosis not present

## 2020-11-21 DIAGNOSIS — N2581 Secondary hyperparathyroidism of renal origin: Secondary | ICD-10-CM | POA: Diagnosis not present

## 2020-11-22 DIAGNOSIS — N2581 Secondary hyperparathyroidism of renal origin: Secondary | ICD-10-CM | POA: Diagnosis not present

## 2020-11-22 DIAGNOSIS — N186 End stage renal disease: Secondary | ICD-10-CM | POA: Diagnosis not present

## 2020-11-22 DIAGNOSIS — D631 Anemia in chronic kidney disease: Secondary | ICD-10-CM | POA: Diagnosis not present

## 2020-11-23 DIAGNOSIS — N2581 Secondary hyperparathyroidism of renal origin: Secondary | ICD-10-CM | POA: Diagnosis not present

## 2020-11-23 DIAGNOSIS — N186 End stage renal disease: Secondary | ICD-10-CM | POA: Diagnosis not present

## 2020-11-23 DIAGNOSIS — D631 Anemia in chronic kidney disease: Secondary | ICD-10-CM | POA: Diagnosis not present

## 2020-11-23 DIAGNOSIS — Z992 Dependence on renal dialysis: Secondary | ICD-10-CM | POA: Diagnosis not present

## 2020-11-24 DIAGNOSIS — N186 End stage renal disease: Secondary | ICD-10-CM | POA: Diagnosis not present

## 2020-11-24 DIAGNOSIS — N2581 Secondary hyperparathyroidism of renal origin: Secondary | ICD-10-CM | POA: Diagnosis not present

## 2020-11-24 DIAGNOSIS — D631 Anemia in chronic kidney disease: Secondary | ICD-10-CM | POA: Diagnosis not present

## 2020-11-25 DIAGNOSIS — N186 End stage renal disease: Secondary | ICD-10-CM | POA: Diagnosis not present

## 2020-11-25 DIAGNOSIS — N2581 Secondary hyperparathyroidism of renal origin: Secondary | ICD-10-CM | POA: Diagnosis not present

## 2020-11-25 DIAGNOSIS — E119 Type 2 diabetes mellitus without complications: Secondary | ICD-10-CM | POA: Diagnosis not present

## 2020-11-25 DIAGNOSIS — D631 Anemia in chronic kidney disease: Secondary | ICD-10-CM | POA: Diagnosis not present

## 2020-11-26 DIAGNOSIS — N2581 Secondary hyperparathyroidism of renal origin: Secondary | ICD-10-CM | POA: Diagnosis not present

## 2020-11-26 DIAGNOSIS — N186 End stage renal disease: Secondary | ICD-10-CM | POA: Diagnosis not present

## 2020-11-26 DIAGNOSIS — D631 Anemia in chronic kidney disease: Secondary | ICD-10-CM | POA: Diagnosis not present

## 2020-11-27 DIAGNOSIS — N186 End stage renal disease: Secondary | ICD-10-CM | POA: Diagnosis not present

## 2020-11-27 DIAGNOSIS — D631 Anemia in chronic kidney disease: Secondary | ICD-10-CM | POA: Diagnosis not present

## 2020-11-27 DIAGNOSIS — N2581 Secondary hyperparathyroidism of renal origin: Secondary | ICD-10-CM | POA: Diagnosis not present

## 2020-11-28 DIAGNOSIS — N2581 Secondary hyperparathyroidism of renal origin: Secondary | ICD-10-CM | POA: Diagnosis not present

## 2020-11-28 DIAGNOSIS — D631 Anemia in chronic kidney disease: Secondary | ICD-10-CM | POA: Diagnosis not present

## 2020-11-28 DIAGNOSIS — N186 End stage renal disease: Secondary | ICD-10-CM | POA: Diagnosis not present

## 2020-11-29 DIAGNOSIS — N186 End stage renal disease: Secondary | ICD-10-CM | POA: Diagnosis not present

## 2020-11-29 DIAGNOSIS — D631 Anemia in chronic kidney disease: Secondary | ICD-10-CM | POA: Diagnosis not present

## 2020-11-29 DIAGNOSIS — N2581 Secondary hyperparathyroidism of renal origin: Secondary | ICD-10-CM | POA: Diagnosis not present

## 2020-11-30 DIAGNOSIS — N2581 Secondary hyperparathyroidism of renal origin: Secondary | ICD-10-CM | POA: Diagnosis not present

## 2020-11-30 DIAGNOSIS — D631 Anemia in chronic kidney disease: Secondary | ICD-10-CM | POA: Diagnosis not present

## 2020-11-30 DIAGNOSIS — N186 End stage renal disease: Secondary | ICD-10-CM | POA: Diagnosis not present

## 2020-12-01 DIAGNOSIS — N2581 Secondary hyperparathyroidism of renal origin: Secondary | ICD-10-CM | POA: Diagnosis not present

## 2020-12-01 DIAGNOSIS — D631 Anemia in chronic kidney disease: Secondary | ICD-10-CM | POA: Diagnosis not present

## 2020-12-01 DIAGNOSIS — N186 End stage renal disease: Secondary | ICD-10-CM | POA: Diagnosis not present

## 2020-12-02 DIAGNOSIS — N2581 Secondary hyperparathyroidism of renal origin: Secondary | ICD-10-CM | POA: Diagnosis not present

## 2020-12-02 DIAGNOSIS — N186 End stage renal disease: Secondary | ICD-10-CM | POA: Diagnosis not present

## 2020-12-02 DIAGNOSIS — D631 Anemia in chronic kidney disease: Secondary | ICD-10-CM | POA: Diagnosis not present

## 2020-12-03 DIAGNOSIS — N186 End stage renal disease: Secondary | ICD-10-CM | POA: Diagnosis not present

## 2020-12-03 DIAGNOSIS — N2581 Secondary hyperparathyroidism of renal origin: Secondary | ICD-10-CM | POA: Diagnosis not present

## 2020-12-03 DIAGNOSIS — D631 Anemia in chronic kidney disease: Secondary | ICD-10-CM | POA: Diagnosis not present

## 2020-12-04 DIAGNOSIS — N2581 Secondary hyperparathyroidism of renal origin: Secondary | ICD-10-CM | POA: Diagnosis not present

## 2020-12-04 DIAGNOSIS — N186 End stage renal disease: Secondary | ICD-10-CM | POA: Diagnosis not present

## 2020-12-04 DIAGNOSIS — D631 Anemia in chronic kidney disease: Secondary | ICD-10-CM | POA: Diagnosis not present

## 2020-12-05 DIAGNOSIS — N2581 Secondary hyperparathyroidism of renal origin: Secondary | ICD-10-CM | POA: Diagnosis not present

## 2020-12-05 DIAGNOSIS — D631 Anemia in chronic kidney disease: Secondary | ICD-10-CM | POA: Diagnosis not present

## 2020-12-05 DIAGNOSIS — N186 End stage renal disease: Secondary | ICD-10-CM | POA: Diagnosis not present

## 2020-12-06 DIAGNOSIS — N186 End stage renal disease: Secondary | ICD-10-CM | POA: Diagnosis not present

## 2020-12-06 DIAGNOSIS — N2581 Secondary hyperparathyroidism of renal origin: Secondary | ICD-10-CM | POA: Diagnosis not present

## 2020-12-06 DIAGNOSIS — D631 Anemia in chronic kidney disease: Secondary | ICD-10-CM | POA: Diagnosis not present

## 2020-12-07 DIAGNOSIS — N186 End stage renal disease: Secondary | ICD-10-CM | POA: Diagnosis not present

## 2020-12-07 DIAGNOSIS — D631 Anemia in chronic kidney disease: Secondary | ICD-10-CM | POA: Diagnosis not present

## 2020-12-07 DIAGNOSIS — N2581 Secondary hyperparathyroidism of renal origin: Secondary | ICD-10-CM | POA: Diagnosis not present

## 2020-12-08 DIAGNOSIS — N186 End stage renal disease: Secondary | ICD-10-CM | POA: Diagnosis not present

## 2020-12-08 DIAGNOSIS — D631 Anemia in chronic kidney disease: Secondary | ICD-10-CM | POA: Diagnosis not present

## 2020-12-08 DIAGNOSIS — N2581 Secondary hyperparathyroidism of renal origin: Secondary | ICD-10-CM | POA: Diagnosis not present

## 2020-12-09 DIAGNOSIS — N2581 Secondary hyperparathyroidism of renal origin: Secondary | ICD-10-CM | POA: Diagnosis not present

## 2020-12-09 DIAGNOSIS — N186 End stage renal disease: Secondary | ICD-10-CM | POA: Diagnosis not present

## 2020-12-09 DIAGNOSIS — D631 Anemia in chronic kidney disease: Secondary | ICD-10-CM | POA: Diagnosis not present

## 2020-12-10 DIAGNOSIS — N2581 Secondary hyperparathyroidism of renal origin: Secondary | ICD-10-CM | POA: Diagnosis not present

## 2020-12-10 DIAGNOSIS — N186 End stage renal disease: Secondary | ICD-10-CM | POA: Diagnosis not present

## 2020-12-10 DIAGNOSIS — D631 Anemia in chronic kidney disease: Secondary | ICD-10-CM | POA: Diagnosis not present

## 2020-12-11 DIAGNOSIS — D631 Anemia in chronic kidney disease: Secondary | ICD-10-CM | POA: Diagnosis not present

## 2020-12-11 DIAGNOSIS — N186 End stage renal disease: Secondary | ICD-10-CM | POA: Diagnosis not present

## 2020-12-11 DIAGNOSIS — N2581 Secondary hyperparathyroidism of renal origin: Secondary | ICD-10-CM | POA: Diagnosis not present

## 2020-12-12 DIAGNOSIS — N2581 Secondary hyperparathyroidism of renal origin: Secondary | ICD-10-CM | POA: Diagnosis not present

## 2020-12-12 DIAGNOSIS — D631 Anemia in chronic kidney disease: Secondary | ICD-10-CM | POA: Diagnosis not present

## 2020-12-12 DIAGNOSIS — N186 End stage renal disease: Secondary | ICD-10-CM | POA: Diagnosis not present

## 2020-12-13 DIAGNOSIS — N2581 Secondary hyperparathyroidism of renal origin: Secondary | ICD-10-CM | POA: Diagnosis not present

## 2020-12-13 DIAGNOSIS — D631 Anemia in chronic kidney disease: Secondary | ICD-10-CM | POA: Diagnosis not present

## 2020-12-13 DIAGNOSIS — N186 End stage renal disease: Secondary | ICD-10-CM | POA: Diagnosis not present

## 2020-12-14 DIAGNOSIS — N186 End stage renal disease: Secondary | ICD-10-CM | POA: Diagnosis not present

## 2020-12-14 DIAGNOSIS — N2581 Secondary hyperparathyroidism of renal origin: Secondary | ICD-10-CM | POA: Diagnosis not present

## 2020-12-14 DIAGNOSIS — D631 Anemia in chronic kidney disease: Secondary | ICD-10-CM | POA: Diagnosis not present

## 2020-12-15 DIAGNOSIS — N2581 Secondary hyperparathyroidism of renal origin: Secondary | ICD-10-CM | POA: Diagnosis not present

## 2020-12-15 DIAGNOSIS — D631 Anemia in chronic kidney disease: Secondary | ICD-10-CM | POA: Diagnosis not present

## 2020-12-15 DIAGNOSIS — N186 End stage renal disease: Secondary | ICD-10-CM | POA: Diagnosis not present

## 2020-12-16 DIAGNOSIS — N186 End stage renal disease: Secondary | ICD-10-CM | POA: Diagnosis not present

## 2020-12-16 DIAGNOSIS — N2581 Secondary hyperparathyroidism of renal origin: Secondary | ICD-10-CM | POA: Diagnosis not present

## 2020-12-16 DIAGNOSIS — D631 Anemia in chronic kidney disease: Secondary | ICD-10-CM | POA: Diagnosis not present

## 2020-12-17 DIAGNOSIS — D631 Anemia in chronic kidney disease: Secondary | ICD-10-CM | POA: Diagnosis not present

## 2020-12-17 DIAGNOSIS — N2581 Secondary hyperparathyroidism of renal origin: Secondary | ICD-10-CM | POA: Diagnosis not present

## 2020-12-17 DIAGNOSIS — N186 End stage renal disease: Secondary | ICD-10-CM | POA: Diagnosis not present

## 2020-12-18 DIAGNOSIS — N2581 Secondary hyperparathyroidism of renal origin: Secondary | ICD-10-CM | POA: Diagnosis not present

## 2020-12-18 DIAGNOSIS — D631 Anemia in chronic kidney disease: Secondary | ICD-10-CM | POA: Diagnosis not present

## 2020-12-18 DIAGNOSIS — N186 End stage renal disease: Secondary | ICD-10-CM | POA: Diagnosis not present

## 2020-12-19 DIAGNOSIS — N2581 Secondary hyperparathyroidism of renal origin: Secondary | ICD-10-CM | POA: Diagnosis not present

## 2020-12-19 DIAGNOSIS — N186 End stage renal disease: Secondary | ICD-10-CM | POA: Diagnosis not present

## 2020-12-19 DIAGNOSIS — D631 Anemia in chronic kidney disease: Secondary | ICD-10-CM | POA: Diagnosis not present

## 2020-12-20 DIAGNOSIS — N186 End stage renal disease: Secondary | ICD-10-CM | POA: Diagnosis not present

## 2020-12-20 DIAGNOSIS — N2581 Secondary hyperparathyroidism of renal origin: Secondary | ICD-10-CM | POA: Diagnosis not present

## 2020-12-20 DIAGNOSIS — D631 Anemia in chronic kidney disease: Secondary | ICD-10-CM | POA: Diagnosis not present

## 2020-12-21 DIAGNOSIS — D631 Anemia in chronic kidney disease: Secondary | ICD-10-CM | POA: Diagnosis not present

## 2020-12-21 DIAGNOSIS — N186 End stage renal disease: Secondary | ICD-10-CM | POA: Diagnosis not present

## 2020-12-21 DIAGNOSIS — N2581 Secondary hyperparathyroidism of renal origin: Secondary | ICD-10-CM | POA: Diagnosis not present

## 2020-12-22 DIAGNOSIS — N2581 Secondary hyperparathyroidism of renal origin: Secondary | ICD-10-CM | POA: Diagnosis not present

## 2020-12-22 DIAGNOSIS — N186 End stage renal disease: Secondary | ICD-10-CM | POA: Diagnosis not present

## 2020-12-22 DIAGNOSIS — D631 Anemia in chronic kidney disease: Secondary | ICD-10-CM | POA: Diagnosis not present

## 2020-12-23 DIAGNOSIS — D631 Anemia in chronic kidney disease: Secondary | ICD-10-CM | POA: Diagnosis not present

## 2020-12-23 DIAGNOSIS — Z992 Dependence on renal dialysis: Secondary | ICD-10-CM | POA: Diagnosis not present

## 2020-12-23 DIAGNOSIS — N2581 Secondary hyperparathyroidism of renal origin: Secondary | ICD-10-CM | POA: Diagnosis not present

## 2020-12-23 DIAGNOSIS — N186 End stage renal disease: Secondary | ICD-10-CM | POA: Diagnosis not present

## 2020-12-24 DIAGNOSIS — Z23 Encounter for immunization: Secondary | ICD-10-CM | POA: Diagnosis not present

## 2020-12-24 DIAGNOSIS — N186 End stage renal disease: Secondary | ICD-10-CM | POA: Diagnosis not present

## 2020-12-24 DIAGNOSIS — N2581 Secondary hyperparathyroidism of renal origin: Secondary | ICD-10-CM | POA: Diagnosis not present

## 2020-12-24 DIAGNOSIS — D631 Anemia in chronic kidney disease: Secondary | ICD-10-CM | POA: Diagnosis not present

## 2020-12-25 DIAGNOSIS — D631 Anemia in chronic kidney disease: Secondary | ICD-10-CM | POA: Diagnosis not present

## 2020-12-25 DIAGNOSIS — Z23 Encounter for immunization: Secondary | ICD-10-CM | POA: Diagnosis not present

## 2020-12-25 DIAGNOSIS — N2581 Secondary hyperparathyroidism of renal origin: Secondary | ICD-10-CM | POA: Diagnosis not present

## 2020-12-25 DIAGNOSIS — N186 End stage renal disease: Secondary | ICD-10-CM | POA: Diagnosis not present

## 2020-12-26 DIAGNOSIS — N2581 Secondary hyperparathyroidism of renal origin: Secondary | ICD-10-CM | POA: Diagnosis not present

## 2020-12-26 DIAGNOSIS — N186 End stage renal disease: Secondary | ICD-10-CM | POA: Diagnosis not present

## 2020-12-26 DIAGNOSIS — Z23 Encounter for immunization: Secondary | ICD-10-CM | POA: Diagnosis not present

## 2020-12-26 DIAGNOSIS — D631 Anemia in chronic kidney disease: Secondary | ICD-10-CM | POA: Diagnosis not present

## 2020-12-27 DIAGNOSIS — D631 Anemia in chronic kidney disease: Secondary | ICD-10-CM | POA: Diagnosis not present

## 2020-12-27 DIAGNOSIS — N186 End stage renal disease: Secondary | ICD-10-CM | POA: Diagnosis not present

## 2020-12-27 DIAGNOSIS — Z23 Encounter for immunization: Secondary | ICD-10-CM | POA: Diagnosis not present

## 2020-12-27 DIAGNOSIS — N2581 Secondary hyperparathyroidism of renal origin: Secondary | ICD-10-CM | POA: Diagnosis not present

## 2020-12-28 DIAGNOSIS — D631 Anemia in chronic kidney disease: Secondary | ICD-10-CM | POA: Diagnosis not present

## 2020-12-28 DIAGNOSIS — N186 End stage renal disease: Secondary | ICD-10-CM | POA: Diagnosis not present

## 2020-12-28 DIAGNOSIS — Z23 Encounter for immunization: Secondary | ICD-10-CM | POA: Diagnosis not present

## 2020-12-28 DIAGNOSIS — N2581 Secondary hyperparathyroidism of renal origin: Secondary | ICD-10-CM | POA: Diagnosis not present

## 2020-12-29 DIAGNOSIS — N2581 Secondary hyperparathyroidism of renal origin: Secondary | ICD-10-CM | POA: Diagnosis not present

## 2020-12-29 DIAGNOSIS — Z23 Encounter for immunization: Secondary | ICD-10-CM | POA: Diagnosis not present

## 2020-12-29 DIAGNOSIS — E119 Type 2 diabetes mellitus without complications: Secondary | ICD-10-CM | POA: Diagnosis not present

## 2020-12-29 DIAGNOSIS — D631 Anemia in chronic kidney disease: Secondary | ICD-10-CM | POA: Diagnosis not present

## 2020-12-29 DIAGNOSIS — N186 End stage renal disease: Secondary | ICD-10-CM | POA: Diagnosis not present

## 2020-12-30 DIAGNOSIS — D631 Anemia in chronic kidney disease: Secondary | ICD-10-CM | POA: Diagnosis not present

## 2020-12-30 DIAGNOSIS — N2581 Secondary hyperparathyroidism of renal origin: Secondary | ICD-10-CM | POA: Diagnosis not present

## 2020-12-30 DIAGNOSIS — N186 End stage renal disease: Secondary | ICD-10-CM | POA: Diagnosis not present

## 2020-12-30 DIAGNOSIS — Z23 Encounter for immunization: Secondary | ICD-10-CM | POA: Diagnosis not present

## 2020-12-30 DIAGNOSIS — E559 Vitamin D deficiency, unspecified: Secondary | ICD-10-CM | POA: Diagnosis not present

## 2020-12-30 DIAGNOSIS — E1165 Type 2 diabetes mellitus with hyperglycemia: Secondary | ICD-10-CM | POA: Diagnosis not present

## 2020-12-30 DIAGNOSIS — E785 Hyperlipidemia, unspecified: Secondary | ICD-10-CM | POA: Diagnosis not present

## 2020-12-30 DIAGNOSIS — Z9114 Patient's other noncompliance with medication regimen: Secondary | ICD-10-CM | POA: Diagnosis not present

## 2020-12-30 DIAGNOSIS — I10 Essential (primary) hypertension: Secondary | ICD-10-CM | POA: Diagnosis not present

## 2020-12-31 DIAGNOSIS — D631 Anemia in chronic kidney disease: Secondary | ICD-10-CM | POA: Diagnosis not present

## 2020-12-31 DIAGNOSIS — N2581 Secondary hyperparathyroidism of renal origin: Secondary | ICD-10-CM | POA: Diagnosis not present

## 2020-12-31 DIAGNOSIS — N186 End stage renal disease: Secondary | ICD-10-CM | POA: Diagnosis not present

## 2020-12-31 DIAGNOSIS — Z23 Encounter for immunization: Secondary | ICD-10-CM | POA: Diagnosis not present

## 2021-01-01 DIAGNOSIS — Z23 Encounter for immunization: Secondary | ICD-10-CM | POA: Diagnosis not present

## 2021-01-01 DIAGNOSIS — N2581 Secondary hyperparathyroidism of renal origin: Secondary | ICD-10-CM | POA: Diagnosis not present

## 2021-01-01 DIAGNOSIS — N186 End stage renal disease: Secondary | ICD-10-CM | POA: Diagnosis not present

## 2021-01-01 DIAGNOSIS — D631 Anemia in chronic kidney disease: Secondary | ICD-10-CM | POA: Diagnosis not present

## 2021-01-02 DIAGNOSIS — D631 Anemia in chronic kidney disease: Secondary | ICD-10-CM | POA: Diagnosis not present

## 2021-01-02 DIAGNOSIS — N2581 Secondary hyperparathyroidism of renal origin: Secondary | ICD-10-CM | POA: Diagnosis not present

## 2021-01-02 DIAGNOSIS — Z23 Encounter for immunization: Secondary | ICD-10-CM | POA: Diagnosis not present

## 2021-01-02 DIAGNOSIS — N186 End stage renal disease: Secondary | ICD-10-CM | POA: Diagnosis not present

## 2021-01-03 DIAGNOSIS — D631 Anemia in chronic kidney disease: Secondary | ICD-10-CM | POA: Diagnosis not present

## 2021-01-03 DIAGNOSIS — Z23 Encounter for immunization: Secondary | ICD-10-CM | POA: Diagnosis not present

## 2021-01-03 DIAGNOSIS — N2581 Secondary hyperparathyroidism of renal origin: Secondary | ICD-10-CM | POA: Diagnosis not present

## 2021-01-03 DIAGNOSIS — N186 End stage renal disease: Secondary | ICD-10-CM | POA: Diagnosis not present

## 2021-01-04 DIAGNOSIS — D631 Anemia in chronic kidney disease: Secondary | ICD-10-CM | POA: Diagnosis not present

## 2021-01-04 DIAGNOSIS — N2581 Secondary hyperparathyroidism of renal origin: Secondary | ICD-10-CM | POA: Diagnosis not present

## 2021-01-04 DIAGNOSIS — N186 End stage renal disease: Secondary | ICD-10-CM | POA: Diagnosis not present

## 2021-01-04 DIAGNOSIS — Z23 Encounter for immunization: Secondary | ICD-10-CM | POA: Diagnosis not present

## 2021-01-05 DIAGNOSIS — N186 End stage renal disease: Secondary | ICD-10-CM | POA: Diagnosis not present

## 2021-01-05 DIAGNOSIS — D631 Anemia in chronic kidney disease: Secondary | ICD-10-CM | POA: Diagnosis not present

## 2021-01-05 DIAGNOSIS — Z23 Encounter for immunization: Secondary | ICD-10-CM | POA: Diagnosis not present

## 2021-01-05 DIAGNOSIS — N2581 Secondary hyperparathyroidism of renal origin: Secondary | ICD-10-CM | POA: Diagnosis not present

## 2021-01-06 DIAGNOSIS — Z23 Encounter for immunization: Secondary | ICD-10-CM | POA: Diagnosis not present

## 2021-01-06 DIAGNOSIS — N2581 Secondary hyperparathyroidism of renal origin: Secondary | ICD-10-CM | POA: Diagnosis not present

## 2021-01-06 DIAGNOSIS — N186 End stage renal disease: Secondary | ICD-10-CM | POA: Diagnosis not present

## 2021-01-06 DIAGNOSIS — D631 Anemia in chronic kidney disease: Secondary | ICD-10-CM | POA: Diagnosis not present

## 2021-01-07 DIAGNOSIS — Z23 Encounter for immunization: Secondary | ICD-10-CM | POA: Diagnosis not present

## 2021-01-07 DIAGNOSIS — N186 End stage renal disease: Secondary | ICD-10-CM | POA: Diagnosis not present

## 2021-01-07 DIAGNOSIS — N2581 Secondary hyperparathyroidism of renal origin: Secondary | ICD-10-CM | POA: Diagnosis not present

## 2021-01-07 DIAGNOSIS — D631 Anemia in chronic kidney disease: Secondary | ICD-10-CM | POA: Diagnosis not present

## 2021-01-08 DIAGNOSIS — N2581 Secondary hyperparathyroidism of renal origin: Secondary | ICD-10-CM | POA: Diagnosis not present

## 2021-01-08 DIAGNOSIS — D631 Anemia in chronic kidney disease: Secondary | ICD-10-CM | POA: Diagnosis not present

## 2021-01-08 DIAGNOSIS — N186 End stage renal disease: Secondary | ICD-10-CM | POA: Diagnosis not present

## 2021-01-08 DIAGNOSIS — Z23 Encounter for immunization: Secondary | ICD-10-CM | POA: Diagnosis not present

## 2021-01-09 DIAGNOSIS — Z23 Encounter for immunization: Secondary | ICD-10-CM | POA: Diagnosis not present

## 2021-01-09 DIAGNOSIS — N186 End stage renal disease: Secondary | ICD-10-CM | POA: Diagnosis not present

## 2021-01-09 DIAGNOSIS — D631 Anemia in chronic kidney disease: Secondary | ICD-10-CM | POA: Diagnosis not present

## 2021-01-09 DIAGNOSIS — N2581 Secondary hyperparathyroidism of renal origin: Secondary | ICD-10-CM | POA: Diagnosis not present

## 2021-01-10 DIAGNOSIS — N186 End stage renal disease: Secondary | ICD-10-CM | POA: Diagnosis not present

## 2021-01-10 DIAGNOSIS — N2581 Secondary hyperparathyroidism of renal origin: Secondary | ICD-10-CM | POA: Diagnosis not present

## 2021-01-10 DIAGNOSIS — D631 Anemia in chronic kidney disease: Secondary | ICD-10-CM | POA: Diagnosis not present

## 2021-01-10 DIAGNOSIS — Z23 Encounter for immunization: Secondary | ICD-10-CM | POA: Diagnosis not present

## 2021-01-11 DIAGNOSIS — N186 End stage renal disease: Secondary | ICD-10-CM | POA: Diagnosis not present

## 2021-01-11 DIAGNOSIS — Z23 Encounter for immunization: Secondary | ICD-10-CM | POA: Diagnosis not present

## 2021-01-11 DIAGNOSIS — D631 Anemia in chronic kidney disease: Secondary | ICD-10-CM | POA: Diagnosis not present

## 2021-01-11 DIAGNOSIS — N2581 Secondary hyperparathyroidism of renal origin: Secondary | ICD-10-CM | POA: Diagnosis not present

## 2021-01-12 DIAGNOSIS — N186 End stage renal disease: Secondary | ICD-10-CM | POA: Diagnosis not present

## 2021-01-12 DIAGNOSIS — D631 Anemia in chronic kidney disease: Secondary | ICD-10-CM | POA: Diagnosis not present

## 2021-01-12 DIAGNOSIS — L703 Acne tropica: Secondary | ICD-10-CM | POA: Diagnosis not present

## 2021-01-12 DIAGNOSIS — Z23 Encounter for immunization: Secondary | ICD-10-CM | POA: Diagnosis not present

## 2021-01-12 DIAGNOSIS — N2581 Secondary hyperparathyroidism of renal origin: Secondary | ICD-10-CM | POA: Diagnosis not present

## 2021-01-12 DIAGNOSIS — L820 Inflamed seborrheic keratosis: Secondary | ICD-10-CM | POA: Diagnosis not present

## 2021-01-13 DIAGNOSIS — N186 End stage renal disease: Secondary | ICD-10-CM | POA: Diagnosis not present

## 2021-01-13 DIAGNOSIS — D631 Anemia in chronic kidney disease: Secondary | ICD-10-CM | POA: Diagnosis not present

## 2021-01-13 DIAGNOSIS — Z23 Encounter for immunization: Secondary | ICD-10-CM | POA: Diagnosis not present

## 2021-01-13 DIAGNOSIS — N2581 Secondary hyperparathyroidism of renal origin: Secondary | ICD-10-CM | POA: Diagnosis not present

## 2021-01-14 DIAGNOSIS — D631 Anemia in chronic kidney disease: Secondary | ICD-10-CM | POA: Diagnosis not present

## 2021-01-14 DIAGNOSIS — N186 End stage renal disease: Secondary | ICD-10-CM | POA: Diagnosis not present

## 2021-01-14 DIAGNOSIS — N2581 Secondary hyperparathyroidism of renal origin: Secondary | ICD-10-CM | POA: Diagnosis not present

## 2021-01-14 DIAGNOSIS — Z23 Encounter for immunization: Secondary | ICD-10-CM | POA: Diagnosis not present

## 2021-01-15 DIAGNOSIS — Z23 Encounter for immunization: Secondary | ICD-10-CM | POA: Diagnosis not present

## 2021-01-15 DIAGNOSIS — N186 End stage renal disease: Secondary | ICD-10-CM | POA: Diagnosis not present

## 2021-01-15 DIAGNOSIS — D631 Anemia in chronic kidney disease: Secondary | ICD-10-CM | POA: Diagnosis not present

## 2021-01-15 DIAGNOSIS — N2581 Secondary hyperparathyroidism of renal origin: Secondary | ICD-10-CM | POA: Diagnosis not present

## 2021-01-16 DIAGNOSIS — N2581 Secondary hyperparathyroidism of renal origin: Secondary | ICD-10-CM | POA: Diagnosis not present

## 2021-01-16 DIAGNOSIS — N186 End stage renal disease: Secondary | ICD-10-CM | POA: Diagnosis not present

## 2021-01-16 DIAGNOSIS — Z23 Encounter for immunization: Secondary | ICD-10-CM | POA: Diagnosis not present

## 2021-01-16 DIAGNOSIS — D631 Anemia in chronic kidney disease: Secondary | ICD-10-CM | POA: Diagnosis not present

## 2021-01-17 DIAGNOSIS — D631 Anemia in chronic kidney disease: Secondary | ICD-10-CM | POA: Diagnosis not present

## 2021-01-17 DIAGNOSIS — N186 End stage renal disease: Secondary | ICD-10-CM | POA: Diagnosis not present

## 2021-01-17 DIAGNOSIS — Z23 Encounter for immunization: Secondary | ICD-10-CM | POA: Diagnosis not present

## 2021-01-17 DIAGNOSIS — N2581 Secondary hyperparathyroidism of renal origin: Secondary | ICD-10-CM | POA: Diagnosis not present

## 2021-01-18 DIAGNOSIS — N2581 Secondary hyperparathyroidism of renal origin: Secondary | ICD-10-CM | POA: Diagnosis not present

## 2021-01-18 DIAGNOSIS — D631 Anemia in chronic kidney disease: Secondary | ICD-10-CM | POA: Diagnosis not present

## 2021-01-18 DIAGNOSIS — N186 End stage renal disease: Secondary | ICD-10-CM | POA: Diagnosis not present

## 2021-01-18 DIAGNOSIS — Z23 Encounter for immunization: Secondary | ICD-10-CM | POA: Diagnosis not present

## 2021-01-19 DIAGNOSIS — Z23 Encounter for immunization: Secondary | ICD-10-CM | POA: Diagnosis not present

## 2021-01-19 DIAGNOSIS — D631 Anemia in chronic kidney disease: Secondary | ICD-10-CM | POA: Diagnosis not present

## 2021-01-19 DIAGNOSIS — N2581 Secondary hyperparathyroidism of renal origin: Secondary | ICD-10-CM | POA: Diagnosis not present

## 2021-01-19 DIAGNOSIS — N186 End stage renal disease: Secondary | ICD-10-CM | POA: Diagnosis not present

## 2021-01-20 DIAGNOSIS — Z23 Encounter for immunization: Secondary | ICD-10-CM | POA: Diagnosis not present

## 2021-01-20 DIAGNOSIS — D631 Anemia in chronic kidney disease: Secondary | ICD-10-CM | POA: Diagnosis not present

## 2021-01-20 DIAGNOSIS — N186 End stage renal disease: Secondary | ICD-10-CM | POA: Diagnosis not present

## 2021-01-20 DIAGNOSIS — N2581 Secondary hyperparathyroidism of renal origin: Secondary | ICD-10-CM | POA: Diagnosis not present

## 2021-01-21 DIAGNOSIS — N2581 Secondary hyperparathyroidism of renal origin: Secondary | ICD-10-CM | POA: Diagnosis not present

## 2021-01-21 DIAGNOSIS — Z23 Encounter for immunization: Secondary | ICD-10-CM | POA: Diagnosis not present

## 2021-01-21 DIAGNOSIS — N186 End stage renal disease: Secondary | ICD-10-CM | POA: Diagnosis not present

## 2021-01-21 DIAGNOSIS — D631 Anemia in chronic kidney disease: Secondary | ICD-10-CM | POA: Diagnosis not present

## 2021-01-22 DIAGNOSIS — N186 End stage renal disease: Secondary | ICD-10-CM | POA: Diagnosis not present

## 2021-01-22 DIAGNOSIS — N2581 Secondary hyperparathyroidism of renal origin: Secondary | ICD-10-CM | POA: Diagnosis not present

## 2021-01-22 DIAGNOSIS — D631 Anemia in chronic kidney disease: Secondary | ICD-10-CM | POA: Diagnosis not present

## 2021-01-22 DIAGNOSIS — Z23 Encounter for immunization: Secondary | ICD-10-CM | POA: Diagnosis not present

## 2021-01-23 DIAGNOSIS — Z992 Dependence on renal dialysis: Secondary | ICD-10-CM | POA: Diagnosis not present

## 2021-01-23 DIAGNOSIS — Z23 Encounter for immunization: Secondary | ICD-10-CM | POA: Diagnosis not present

## 2021-01-23 DIAGNOSIS — N186 End stage renal disease: Secondary | ICD-10-CM | POA: Diagnosis not present

## 2021-01-23 DIAGNOSIS — D631 Anemia in chronic kidney disease: Secondary | ICD-10-CM | POA: Diagnosis not present

## 2021-01-23 DIAGNOSIS — N2581 Secondary hyperparathyroidism of renal origin: Secondary | ICD-10-CM | POA: Diagnosis not present

## 2021-01-24 DIAGNOSIS — D631 Anemia in chronic kidney disease: Secondary | ICD-10-CM | POA: Diagnosis not present

## 2021-01-24 DIAGNOSIS — N2581 Secondary hyperparathyroidism of renal origin: Secondary | ICD-10-CM | POA: Diagnosis not present

## 2021-01-24 DIAGNOSIS — N186 End stage renal disease: Secondary | ICD-10-CM | POA: Diagnosis not present

## 2021-01-25 DIAGNOSIS — N186 End stage renal disease: Secondary | ICD-10-CM | POA: Diagnosis not present

## 2021-01-25 DIAGNOSIS — D631 Anemia in chronic kidney disease: Secondary | ICD-10-CM | POA: Diagnosis not present

## 2021-01-25 DIAGNOSIS — N2581 Secondary hyperparathyroidism of renal origin: Secondary | ICD-10-CM | POA: Diagnosis not present

## 2021-01-26 DIAGNOSIS — E119 Type 2 diabetes mellitus without complications: Secondary | ICD-10-CM | POA: Diagnosis not present

## 2021-01-26 DIAGNOSIS — D631 Anemia in chronic kidney disease: Secondary | ICD-10-CM | POA: Diagnosis not present

## 2021-01-26 DIAGNOSIS — N186 End stage renal disease: Secondary | ICD-10-CM | POA: Diagnosis not present

## 2021-01-26 DIAGNOSIS — N2581 Secondary hyperparathyroidism of renal origin: Secondary | ICD-10-CM | POA: Diagnosis not present

## 2021-01-27 DIAGNOSIS — D631 Anemia in chronic kidney disease: Secondary | ICD-10-CM | POA: Diagnosis not present

## 2021-01-27 DIAGNOSIS — N186 End stage renal disease: Secondary | ICD-10-CM | POA: Diagnosis not present

## 2021-01-27 DIAGNOSIS — N2581 Secondary hyperparathyroidism of renal origin: Secondary | ICD-10-CM | POA: Diagnosis not present

## 2021-01-28 DIAGNOSIS — D631 Anemia in chronic kidney disease: Secondary | ICD-10-CM | POA: Diagnosis not present

## 2021-01-28 DIAGNOSIS — N2581 Secondary hyperparathyroidism of renal origin: Secondary | ICD-10-CM | POA: Diagnosis not present

## 2021-01-28 DIAGNOSIS — N186 End stage renal disease: Secondary | ICD-10-CM | POA: Diagnosis not present

## 2021-01-29 DIAGNOSIS — N2581 Secondary hyperparathyroidism of renal origin: Secondary | ICD-10-CM | POA: Diagnosis not present

## 2021-01-29 DIAGNOSIS — N186 End stage renal disease: Secondary | ICD-10-CM | POA: Diagnosis not present

## 2021-01-29 DIAGNOSIS — D631 Anemia in chronic kidney disease: Secondary | ICD-10-CM | POA: Diagnosis not present

## 2021-01-30 DIAGNOSIS — D631 Anemia in chronic kidney disease: Secondary | ICD-10-CM | POA: Diagnosis not present

## 2021-01-30 DIAGNOSIS — N2581 Secondary hyperparathyroidism of renal origin: Secondary | ICD-10-CM | POA: Diagnosis not present

## 2021-01-30 DIAGNOSIS — N186 End stage renal disease: Secondary | ICD-10-CM | POA: Diagnosis not present

## 2021-01-31 DIAGNOSIS — N186 End stage renal disease: Secondary | ICD-10-CM | POA: Diagnosis not present

## 2021-01-31 DIAGNOSIS — N2581 Secondary hyperparathyroidism of renal origin: Secondary | ICD-10-CM | POA: Diagnosis not present

## 2021-01-31 DIAGNOSIS — D631 Anemia in chronic kidney disease: Secondary | ICD-10-CM | POA: Diagnosis not present

## 2021-02-01 DIAGNOSIS — N2581 Secondary hyperparathyroidism of renal origin: Secondary | ICD-10-CM | POA: Diagnosis not present

## 2021-02-01 DIAGNOSIS — D631 Anemia in chronic kidney disease: Secondary | ICD-10-CM | POA: Diagnosis not present

## 2021-02-01 DIAGNOSIS — N186 End stage renal disease: Secondary | ICD-10-CM | POA: Diagnosis not present

## 2021-02-02 DIAGNOSIS — N186 End stage renal disease: Secondary | ICD-10-CM | POA: Diagnosis not present

## 2021-02-02 DIAGNOSIS — N2581 Secondary hyperparathyroidism of renal origin: Secondary | ICD-10-CM | POA: Diagnosis not present

## 2021-02-02 DIAGNOSIS — D631 Anemia in chronic kidney disease: Secondary | ICD-10-CM | POA: Diagnosis not present

## 2021-02-03 DIAGNOSIS — N186 End stage renal disease: Secondary | ICD-10-CM | POA: Diagnosis not present

## 2021-02-03 DIAGNOSIS — D631 Anemia in chronic kidney disease: Secondary | ICD-10-CM | POA: Diagnosis not present

## 2021-02-03 DIAGNOSIS — N2581 Secondary hyperparathyroidism of renal origin: Secondary | ICD-10-CM | POA: Diagnosis not present

## 2021-02-04 DIAGNOSIS — D631 Anemia in chronic kidney disease: Secondary | ICD-10-CM | POA: Diagnosis not present

## 2021-02-04 DIAGNOSIS — N2581 Secondary hyperparathyroidism of renal origin: Secondary | ICD-10-CM | POA: Diagnosis not present

## 2021-02-04 DIAGNOSIS — N186 End stage renal disease: Secondary | ICD-10-CM | POA: Diagnosis not present

## 2021-02-05 DIAGNOSIS — N186 End stage renal disease: Secondary | ICD-10-CM | POA: Diagnosis not present

## 2021-02-05 DIAGNOSIS — D631 Anemia in chronic kidney disease: Secondary | ICD-10-CM | POA: Diagnosis not present

## 2021-02-05 DIAGNOSIS — N2581 Secondary hyperparathyroidism of renal origin: Secondary | ICD-10-CM | POA: Diagnosis not present

## 2021-02-06 DIAGNOSIS — D631 Anemia in chronic kidney disease: Secondary | ICD-10-CM | POA: Diagnosis not present

## 2021-02-06 DIAGNOSIS — N186 End stage renal disease: Secondary | ICD-10-CM | POA: Diagnosis not present

## 2021-02-06 DIAGNOSIS — N2581 Secondary hyperparathyroidism of renal origin: Secondary | ICD-10-CM | POA: Diagnosis not present

## 2021-02-07 DIAGNOSIS — D631 Anemia in chronic kidney disease: Secondary | ICD-10-CM | POA: Diagnosis not present

## 2021-02-07 DIAGNOSIS — N2581 Secondary hyperparathyroidism of renal origin: Secondary | ICD-10-CM | POA: Diagnosis not present

## 2021-02-07 DIAGNOSIS — N186 End stage renal disease: Secondary | ICD-10-CM | POA: Diagnosis not present

## 2021-02-08 DIAGNOSIS — N2581 Secondary hyperparathyroidism of renal origin: Secondary | ICD-10-CM | POA: Diagnosis not present

## 2021-02-08 DIAGNOSIS — D631 Anemia in chronic kidney disease: Secondary | ICD-10-CM | POA: Diagnosis not present

## 2021-02-08 DIAGNOSIS — N186 End stage renal disease: Secondary | ICD-10-CM | POA: Diagnosis not present

## 2021-02-09 DIAGNOSIS — N2581 Secondary hyperparathyroidism of renal origin: Secondary | ICD-10-CM | POA: Diagnosis not present

## 2021-02-09 DIAGNOSIS — D631 Anemia in chronic kidney disease: Secondary | ICD-10-CM | POA: Diagnosis not present

## 2021-02-09 DIAGNOSIS — N186 End stage renal disease: Secondary | ICD-10-CM | POA: Diagnosis not present

## 2021-02-10 DIAGNOSIS — D631 Anemia in chronic kidney disease: Secondary | ICD-10-CM | POA: Diagnosis not present

## 2021-02-10 DIAGNOSIS — N2581 Secondary hyperparathyroidism of renal origin: Secondary | ICD-10-CM | POA: Diagnosis not present

## 2021-02-10 DIAGNOSIS — N186 End stage renal disease: Secondary | ICD-10-CM | POA: Diagnosis not present

## 2021-02-11 DIAGNOSIS — N186 End stage renal disease: Secondary | ICD-10-CM | POA: Diagnosis not present

## 2021-02-11 DIAGNOSIS — D631 Anemia in chronic kidney disease: Secondary | ICD-10-CM | POA: Diagnosis not present

## 2021-02-11 DIAGNOSIS — N2581 Secondary hyperparathyroidism of renal origin: Secondary | ICD-10-CM | POA: Diagnosis not present

## 2021-02-12 DIAGNOSIS — D631 Anemia in chronic kidney disease: Secondary | ICD-10-CM | POA: Diagnosis not present

## 2021-02-12 DIAGNOSIS — N186 End stage renal disease: Secondary | ICD-10-CM | POA: Diagnosis not present

## 2021-02-12 DIAGNOSIS — N2581 Secondary hyperparathyroidism of renal origin: Secondary | ICD-10-CM | POA: Diagnosis not present

## 2021-02-13 DIAGNOSIS — N186 End stage renal disease: Secondary | ICD-10-CM | POA: Diagnosis not present

## 2021-02-13 DIAGNOSIS — D631 Anemia in chronic kidney disease: Secondary | ICD-10-CM | POA: Diagnosis not present

## 2021-02-13 DIAGNOSIS — N2581 Secondary hyperparathyroidism of renal origin: Secondary | ICD-10-CM | POA: Diagnosis not present

## 2021-02-14 DIAGNOSIS — N2581 Secondary hyperparathyroidism of renal origin: Secondary | ICD-10-CM | POA: Diagnosis not present

## 2021-02-14 DIAGNOSIS — D631 Anemia in chronic kidney disease: Secondary | ICD-10-CM | POA: Diagnosis not present

## 2021-02-14 DIAGNOSIS — N186 End stage renal disease: Secondary | ICD-10-CM | POA: Diagnosis not present

## 2021-02-15 DIAGNOSIS — N186 End stage renal disease: Secondary | ICD-10-CM | POA: Diagnosis not present

## 2021-02-15 DIAGNOSIS — D631 Anemia in chronic kidney disease: Secondary | ICD-10-CM | POA: Diagnosis not present

## 2021-02-15 DIAGNOSIS — N2581 Secondary hyperparathyroidism of renal origin: Secondary | ICD-10-CM | POA: Diagnosis not present

## 2021-02-16 DIAGNOSIS — N2581 Secondary hyperparathyroidism of renal origin: Secondary | ICD-10-CM | POA: Diagnosis not present

## 2021-02-16 DIAGNOSIS — D631 Anemia in chronic kidney disease: Secondary | ICD-10-CM | POA: Diagnosis not present

## 2021-02-16 DIAGNOSIS — N186 End stage renal disease: Secondary | ICD-10-CM | POA: Diagnosis not present

## 2021-02-17 DIAGNOSIS — N2581 Secondary hyperparathyroidism of renal origin: Secondary | ICD-10-CM | POA: Diagnosis not present

## 2021-02-17 DIAGNOSIS — N186 End stage renal disease: Secondary | ICD-10-CM | POA: Diagnosis not present

## 2021-02-17 DIAGNOSIS — D631 Anemia in chronic kidney disease: Secondary | ICD-10-CM | POA: Diagnosis not present

## 2021-02-18 DIAGNOSIS — N186 End stage renal disease: Secondary | ICD-10-CM | POA: Diagnosis not present

## 2021-02-18 DIAGNOSIS — D631 Anemia in chronic kidney disease: Secondary | ICD-10-CM | POA: Diagnosis not present

## 2021-02-18 DIAGNOSIS — N2581 Secondary hyperparathyroidism of renal origin: Secondary | ICD-10-CM | POA: Diagnosis not present

## 2021-02-19 DIAGNOSIS — N2581 Secondary hyperparathyroidism of renal origin: Secondary | ICD-10-CM | POA: Diagnosis not present

## 2021-02-19 DIAGNOSIS — D631 Anemia in chronic kidney disease: Secondary | ICD-10-CM | POA: Diagnosis not present

## 2021-02-19 DIAGNOSIS — N186 End stage renal disease: Secondary | ICD-10-CM | POA: Diagnosis not present

## 2021-02-20 DIAGNOSIS — N2581 Secondary hyperparathyroidism of renal origin: Secondary | ICD-10-CM | POA: Diagnosis not present

## 2021-02-20 DIAGNOSIS — D631 Anemia in chronic kidney disease: Secondary | ICD-10-CM | POA: Diagnosis not present

## 2021-02-20 DIAGNOSIS — N186 End stage renal disease: Secondary | ICD-10-CM | POA: Diagnosis not present

## 2021-02-21 DIAGNOSIS — D631 Anemia in chronic kidney disease: Secondary | ICD-10-CM | POA: Diagnosis not present

## 2021-02-21 DIAGNOSIS — N2581 Secondary hyperparathyroidism of renal origin: Secondary | ICD-10-CM | POA: Diagnosis not present

## 2021-02-21 DIAGNOSIS — N186 End stage renal disease: Secondary | ICD-10-CM | POA: Diagnosis not present

## 2021-02-22 DIAGNOSIS — Z992 Dependence on renal dialysis: Secondary | ICD-10-CM | POA: Diagnosis not present

## 2021-02-22 DIAGNOSIS — N186 End stage renal disease: Secondary | ICD-10-CM | POA: Diagnosis not present

## 2021-02-22 DIAGNOSIS — N2581 Secondary hyperparathyroidism of renal origin: Secondary | ICD-10-CM | POA: Diagnosis not present

## 2021-02-22 DIAGNOSIS — D631 Anemia in chronic kidney disease: Secondary | ICD-10-CM | POA: Diagnosis not present

## 2021-02-23 DIAGNOSIS — D631 Anemia in chronic kidney disease: Secondary | ICD-10-CM | POA: Diagnosis not present

## 2021-02-23 DIAGNOSIS — N186 End stage renal disease: Secondary | ICD-10-CM | POA: Diagnosis not present

## 2021-02-23 DIAGNOSIS — N2581 Secondary hyperparathyroidism of renal origin: Secondary | ICD-10-CM | POA: Diagnosis not present

## 2021-02-24 DIAGNOSIS — N186 End stage renal disease: Secondary | ICD-10-CM | POA: Diagnosis not present

## 2021-02-24 DIAGNOSIS — N2581 Secondary hyperparathyroidism of renal origin: Secondary | ICD-10-CM | POA: Diagnosis not present

## 2021-02-24 DIAGNOSIS — D631 Anemia in chronic kidney disease: Secondary | ICD-10-CM | POA: Diagnosis not present

## 2021-02-25 DIAGNOSIS — D631 Anemia in chronic kidney disease: Secondary | ICD-10-CM | POA: Diagnosis not present

## 2021-02-25 DIAGNOSIS — N2581 Secondary hyperparathyroidism of renal origin: Secondary | ICD-10-CM | POA: Diagnosis not present

## 2021-02-25 DIAGNOSIS — N186 End stage renal disease: Secondary | ICD-10-CM | POA: Diagnosis not present

## 2021-02-26 DIAGNOSIS — N2581 Secondary hyperparathyroidism of renal origin: Secondary | ICD-10-CM | POA: Diagnosis not present

## 2021-02-26 DIAGNOSIS — D631 Anemia in chronic kidney disease: Secondary | ICD-10-CM | POA: Diagnosis not present

## 2021-02-26 DIAGNOSIS — N186 End stage renal disease: Secondary | ICD-10-CM | POA: Diagnosis not present

## 2021-02-27 DIAGNOSIS — N2581 Secondary hyperparathyroidism of renal origin: Secondary | ICD-10-CM | POA: Diagnosis not present

## 2021-02-27 DIAGNOSIS — D631 Anemia in chronic kidney disease: Secondary | ICD-10-CM | POA: Diagnosis not present

## 2021-02-27 DIAGNOSIS — N186 End stage renal disease: Secondary | ICD-10-CM | POA: Diagnosis not present

## 2021-02-28 DIAGNOSIS — D631 Anemia in chronic kidney disease: Secondary | ICD-10-CM | POA: Diagnosis not present

## 2021-02-28 DIAGNOSIS — N2581 Secondary hyperparathyroidism of renal origin: Secondary | ICD-10-CM | POA: Diagnosis not present

## 2021-02-28 DIAGNOSIS — N186 End stage renal disease: Secondary | ICD-10-CM | POA: Diagnosis not present

## 2021-03-01 DIAGNOSIS — N186 End stage renal disease: Secondary | ICD-10-CM | POA: Diagnosis not present

## 2021-03-01 DIAGNOSIS — N2581 Secondary hyperparathyroidism of renal origin: Secondary | ICD-10-CM | POA: Diagnosis not present

## 2021-03-01 DIAGNOSIS — D631 Anemia in chronic kidney disease: Secondary | ICD-10-CM | POA: Diagnosis not present

## 2021-03-02 DIAGNOSIS — N186 End stage renal disease: Secondary | ICD-10-CM | POA: Diagnosis not present

## 2021-03-02 DIAGNOSIS — N2581 Secondary hyperparathyroidism of renal origin: Secondary | ICD-10-CM | POA: Diagnosis not present

## 2021-03-02 DIAGNOSIS — D631 Anemia in chronic kidney disease: Secondary | ICD-10-CM | POA: Diagnosis not present

## 2021-03-03 DIAGNOSIS — D631 Anemia in chronic kidney disease: Secondary | ICD-10-CM | POA: Diagnosis not present

## 2021-03-03 DIAGNOSIS — N2581 Secondary hyperparathyroidism of renal origin: Secondary | ICD-10-CM | POA: Diagnosis not present

## 2021-03-03 DIAGNOSIS — N186 End stage renal disease: Secondary | ICD-10-CM | POA: Diagnosis not present

## 2021-03-04 DIAGNOSIS — N186 End stage renal disease: Secondary | ICD-10-CM | POA: Diagnosis not present

## 2021-03-04 DIAGNOSIS — D631 Anemia in chronic kidney disease: Secondary | ICD-10-CM | POA: Diagnosis not present

## 2021-03-04 DIAGNOSIS — N2581 Secondary hyperparathyroidism of renal origin: Secondary | ICD-10-CM | POA: Diagnosis not present

## 2021-03-05 DIAGNOSIS — N2581 Secondary hyperparathyroidism of renal origin: Secondary | ICD-10-CM | POA: Diagnosis not present

## 2021-03-05 DIAGNOSIS — D631 Anemia in chronic kidney disease: Secondary | ICD-10-CM | POA: Diagnosis not present

## 2021-03-05 DIAGNOSIS — N186 End stage renal disease: Secondary | ICD-10-CM | POA: Diagnosis not present

## 2021-03-06 DIAGNOSIS — N2581 Secondary hyperparathyroidism of renal origin: Secondary | ICD-10-CM | POA: Diagnosis not present

## 2021-03-06 DIAGNOSIS — D631 Anemia in chronic kidney disease: Secondary | ICD-10-CM | POA: Diagnosis not present

## 2021-03-06 DIAGNOSIS — N186 End stage renal disease: Secondary | ICD-10-CM | POA: Diagnosis not present

## 2021-03-07 DIAGNOSIS — N186 End stage renal disease: Secondary | ICD-10-CM | POA: Diagnosis not present

## 2021-03-07 DIAGNOSIS — D631 Anemia in chronic kidney disease: Secondary | ICD-10-CM | POA: Diagnosis not present

## 2021-03-07 DIAGNOSIS — N2581 Secondary hyperparathyroidism of renal origin: Secondary | ICD-10-CM | POA: Diagnosis not present

## 2021-03-08 DIAGNOSIS — D631 Anemia in chronic kidney disease: Secondary | ICD-10-CM | POA: Diagnosis not present

## 2021-03-08 DIAGNOSIS — N2581 Secondary hyperparathyroidism of renal origin: Secondary | ICD-10-CM | POA: Diagnosis not present

## 2021-03-08 DIAGNOSIS — N186 End stage renal disease: Secondary | ICD-10-CM | POA: Diagnosis not present

## 2021-03-09 DIAGNOSIS — N186 End stage renal disease: Secondary | ICD-10-CM | POA: Diagnosis not present

## 2021-03-09 DIAGNOSIS — N2581 Secondary hyperparathyroidism of renal origin: Secondary | ICD-10-CM | POA: Diagnosis not present

## 2021-03-09 DIAGNOSIS — D631 Anemia in chronic kidney disease: Secondary | ICD-10-CM | POA: Diagnosis not present

## 2021-03-10 DIAGNOSIS — N186 End stage renal disease: Secondary | ICD-10-CM | POA: Diagnosis not present

## 2021-03-10 DIAGNOSIS — D631 Anemia in chronic kidney disease: Secondary | ICD-10-CM | POA: Diagnosis not present

## 2021-03-10 DIAGNOSIS — N2581 Secondary hyperparathyroidism of renal origin: Secondary | ICD-10-CM | POA: Diagnosis not present

## 2021-03-11 DIAGNOSIS — N186 End stage renal disease: Secondary | ICD-10-CM | POA: Diagnosis not present

## 2021-03-11 DIAGNOSIS — N2581 Secondary hyperparathyroidism of renal origin: Secondary | ICD-10-CM | POA: Diagnosis not present

## 2021-03-11 DIAGNOSIS — D631 Anemia in chronic kidney disease: Secondary | ICD-10-CM | POA: Diagnosis not present

## 2021-03-12 DIAGNOSIS — D631 Anemia in chronic kidney disease: Secondary | ICD-10-CM | POA: Diagnosis not present

## 2021-03-12 DIAGNOSIS — N186 End stage renal disease: Secondary | ICD-10-CM | POA: Diagnosis not present

## 2021-03-12 DIAGNOSIS — N2581 Secondary hyperparathyroidism of renal origin: Secondary | ICD-10-CM | POA: Diagnosis not present

## 2021-03-13 DIAGNOSIS — D631 Anemia in chronic kidney disease: Secondary | ICD-10-CM | POA: Diagnosis not present

## 2021-03-13 DIAGNOSIS — N186 End stage renal disease: Secondary | ICD-10-CM | POA: Diagnosis not present

## 2021-03-13 DIAGNOSIS — N2581 Secondary hyperparathyroidism of renal origin: Secondary | ICD-10-CM | POA: Diagnosis not present

## 2021-03-14 DIAGNOSIS — N186 End stage renal disease: Secondary | ICD-10-CM | POA: Diagnosis not present

## 2021-03-14 DIAGNOSIS — N2581 Secondary hyperparathyroidism of renal origin: Secondary | ICD-10-CM | POA: Diagnosis not present

## 2021-03-14 DIAGNOSIS — D631 Anemia in chronic kidney disease: Secondary | ICD-10-CM | POA: Diagnosis not present

## 2021-03-15 DIAGNOSIS — N186 End stage renal disease: Secondary | ICD-10-CM | POA: Diagnosis not present

## 2021-03-15 DIAGNOSIS — D631 Anemia in chronic kidney disease: Secondary | ICD-10-CM | POA: Diagnosis not present

## 2021-03-15 DIAGNOSIS — N2581 Secondary hyperparathyroidism of renal origin: Secondary | ICD-10-CM | POA: Diagnosis not present

## 2021-03-16 DIAGNOSIS — N2581 Secondary hyperparathyroidism of renal origin: Secondary | ICD-10-CM | POA: Diagnosis not present

## 2021-03-16 DIAGNOSIS — N186 End stage renal disease: Secondary | ICD-10-CM | POA: Diagnosis not present

## 2021-03-16 DIAGNOSIS — D631 Anemia in chronic kidney disease: Secondary | ICD-10-CM | POA: Diagnosis not present

## 2021-03-17 DIAGNOSIS — D631 Anemia in chronic kidney disease: Secondary | ICD-10-CM | POA: Diagnosis not present

## 2021-03-17 DIAGNOSIS — N186 End stage renal disease: Secondary | ICD-10-CM | POA: Diagnosis not present

## 2021-03-17 DIAGNOSIS — N2581 Secondary hyperparathyroidism of renal origin: Secondary | ICD-10-CM | POA: Diagnosis not present

## 2021-03-18 DIAGNOSIS — N2581 Secondary hyperparathyroidism of renal origin: Secondary | ICD-10-CM | POA: Diagnosis not present

## 2021-03-18 DIAGNOSIS — N186 End stage renal disease: Secondary | ICD-10-CM | POA: Diagnosis not present

## 2021-03-18 DIAGNOSIS — D631 Anemia in chronic kidney disease: Secondary | ICD-10-CM | POA: Diagnosis not present

## 2021-03-19 DIAGNOSIS — N2581 Secondary hyperparathyroidism of renal origin: Secondary | ICD-10-CM | POA: Diagnosis not present

## 2021-03-19 DIAGNOSIS — D631 Anemia in chronic kidney disease: Secondary | ICD-10-CM | POA: Diagnosis not present

## 2021-03-19 DIAGNOSIS — N186 End stage renal disease: Secondary | ICD-10-CM | POA: Diagnosis not present

## 2021-03-20 DIAGNOSIS — N186 End stage renal disease: Secondary | ICD-10-CM | POA: Diagnosis not present

## 2021-03-20 DIAGNOSIS — N2581 Secondary hyperparathyroidism of renal origin: Secondary | ICD-10-CM | POA: Diagnosis not present

## 2021-03-20 DIAGNOSIS — D631 Anemia in chronic kidney disease: Secondary | ICD-10-CM | POA: Diagnosis not present

## 2021-03-21 DIAGNOSIS — N186 End stage renal disease: Secondary | ICD-10-CM | POA: Diagnosis not present

## 2021-03-21 DIAGNOSIS — N2581 Secondary hyperparathyroidism of renal origin: Secondary | ICD-10-CM | POA: Diagnosis not present

## 2021-03-21 DIAGNOSIS — D631 Anemia in chronic kidney disease: Secondary | ICD-10-CM | POA: Diagnosis not present

## 2021-03-22 DIAGNOSIS — N2581 Secondary hyperparathyroidism of renal origin: Secondary | ICD-10-CM | POA: Diagnosis not present

## 2021-03-22 DIAGNOSIS — D631 Anemia in chronic kidney disease: Secondary | ICD-10-CM | POA: Diagnosis not present

## 2021-03-22 DIAGNOSIS — N186 End stage renal disease: Secondary | ICD-10-CM | POA: Diagnosis not present

## 2021-03-23 DIAGNOSIS — D631 Anemia in chronic kidney disease: Secondary | ICD-10-CM | POA: Diagnosis not present

## 2021-03-23 DIAGNOSIS — N186 End stage renal disease: Secondary | ICD-10-CM | POA: Diagnosis not present

## 2021-03-23 DIAGNOSIS — N2581 Secondary hyperparathyroidism of renal origin: Secondary | ICD-10-CM | POA: Diagnosis not present

## 2021-03-24 DIAGNOSIS — N2581 Secondary hyperparathyroidism of renal origin: Secondary | ICD-10-CM | POA: Diagnosis not present

## 2021-03-24 DIAGNOSIS — N186 End stage renal disease: Secondary | ICD-10-CM | POA: Diagnosis not present

## 2021-03-24 DIAGNOSIS — D631 Anemia in chronic kidney disease: Secondary | ICD-10-CM | POA: Diagnosis not present

## 2021-03-25 DIAGNOSIS — N186 End stage renal disease: Secondary | ICD-10-CM | POA: Diagnosis not present

## 2021-03-25 DIAGNOSIS — N2581 Secondary hyperparathyroidism of renal origin: Secondary | ICD-10-CM | POA: Diagnosis not present

## 2021-03-25 DIAGNOSIS — Z992 Dependence on renal dialysis: Secondary | ICD-10-CM | POA: Diagnosis not present

## 2021-03-25 DIAGNOSIS — D631 Anemia in chronic kidney disease: Secondary | ICD-10-CM | POA: Diagnosis not present

## 2021-03-31 DIAGNOSIS — E119 Type 2 diabetes mellitus without complications: Secondary | ICD-10-CM | POA: Diagnosis not present

## 2021-04-25 DIAGNOSIS — Z992 Dependence on renal dialysis: Secondary | ICD-10-CM | POA: Diagnosis not present

## 2021-04-25 DIAGNOSIS — N186 End stage renal disease: Secondary | ICD-10-CM | POA: Diagnosis not present

## 2021-04-26 DIAGNOSIS — N2581 Secondary hyperparathyroidism of renal origin: Secondary | ICD-10-CM | POA: Diagnosis not present

## 2021-04-26 DIAGNOSIS — N186 End stage renal disease: Secondary | ICD-10-CM | POA: Diagnosis not present

## 2021-04-26 DIAGNOSIS — D631 Anemia in chronic kidney disease: Secondary | ICD-10-CM | POA: Diagnosis not present

## 2021-04-27 DIAGNOSIS — N186 End stage renal disease: Secondary | ICD-10-CM | POA: Diagnosis not present

## 2021-04-27 DIAGNOSIS — D631 Anemia in chronic kidney disease: Secondary | ICD-10-CM | POA: Diagnosis not present

## 2021-04-27 DIAGNOSIS — N2581 Secondary hyperparathyroidism of renal origin: Secondary | ICD-10-CM | POA: Diagnosis not present

## 2021-04-28 DIAGNOSIS — N2581 Secondary hyperparathyroidism of renal origin: Secondary | ICD-10-CM | POA: Diagnosis not present

## 2021-04-28 DIAGNOSIS — N186 End stage renal disease: Secondary | ICD-10-CM | POA: Diagnosis not present

## 2021-04-28 DIAGNOSIS — D631 Anemia in chronic kidney disease: Secondary | ICD-10-CM | POA: Diagnosis not present

## 2021-04-28 DIAGNOSIS — E119 Type 2 diabetes mellitus without complications: Secondary | ICD-10-CM | POA: Diagnosis not present

## 2021-04-29 DIAGNOSIS — N186 End stage renal disease: Secondary | ICD-10-CM | POA: Diagnosis not present

## 2021-04-29 DIAGNOSIS — N2581 Secondary hyperparathyroidism of renal origin: Secondary | ICD-10-CM | POA: Diagnosis not present

## 2021-04-29 DIAGNOSIS — D631 Anemia in chronic kidney disease: Secondary | ICD-10-CM | POA: Diagnosis not present

## 2021-04-30 DIAGNOSIS — N186 End stage renal disease: Secondary | ICD-10-CM | POA: Diagnosis not present

## 2021-04-30 DIAGNOSIS — D631 Anemia in chronic kidney disease: Secondary | ICD-10-CM | POA: Diagnosis not present

## 2021-04-30 DIAGNOSIS — N2581 Secondary hyperparathyroidism of renal origin: Secondary | ICD-10-CM | POA: Diagnosis not present

## 2021-05-01 DIAGNOSIS — N2581 Secondary hyperparathyroidism of renal origin: Secondary | ICD-10-CM | POA: Diagnosis not present

## 2021-05-01 DIAGNOSIS — D631 Anemia in chronic kidney disease: Secondary | ICD-10-CM | POA: Diagnosis not present

## 2021-05-01 DIAGNOSIS — N186 End stage renal disease: Secondary | ICD-10-CM | POA: Diagnosis not present

## 2021-05-02 DIAGNOSIS — N2581 Secondary hyperparathyroidism of renal origin: Secondary | ICD-10-CM | POA: Diagnosis not present

## 2021-05-02 DIAGNOSIS — N186 End stage renal disease: Secondary | ICD-10-CM | POA: Diagnosis not present

## 2021-05-02 DIAGNOSIS — D631 Anemia in chronic kidney disease: Secondary | ICD-10-CM | POA: Diagnosis not present

## 2021-05-03 DIAGNOSIS — N2581 Secondary hyperparathyroidism of renal origin: Secondary | ICD-10-CM | POA: Diagnosis not present

## 2021-05-03 DIAGNOSIS — D631 Anemia in chronic kidney disease: Secondary | ICD-10-CM | POA: Diagnosis not present

## 2021-05-03 DIAGNOSIS — N186 End stage renal disease: Secondary | ICD-10-CM | POA: Diagnosis not present

## 2021-05-04 DIAGNOSIS — D631 Anemia in chronic kidney disease: Secondary | ICD-10-CM | POA: Diagnosis not present

## 2021-05-04 DIAGNOSIS — N2581 Secondary hyperparathyroidism of renal origin: Secondary | ICD-10-CM | POA: Diagnosis not present

## 2021-05-04 DIAGNOSIS — N186 End stage renal disease: Secondary | ICD-10-CM | POA: Diagnosis not present

## 2021-05-05 DIAGNOSIS — I951 Orthostatic hypotension: Secondary | ICD-10-CM | POA: Diagnosis not present

## 2021-05-05 DIAGNOSIS — Z7689 Persons encountering health services in other specified circumstances: Secondary | ICD-10-CM | POA: Diagnosis not present

## 2021-05-05 DIAGNOSIS — N2581 Secondary hyperparathyroidism of renal origin: Secondary | ICD-10-CM | POA: Diagnosis not present

## 2021-05-05 DIAGNOSIS — Z6830 Body mass index (BMI) 30.0-30.9, adult: Secondary | ICD-10-CM | POA: Diagnosis not present

## 2021-05-05 DIAGNOSIS — D631 Anemia in chronic kidney disease: Secondary | ICD-10-CM | POA: Diagnosis not present

## 2021-05-05 DIAGNOSIS — N186 End stage renal disease: Secondary | ICD-10-CM | POA: Diagnosis not present

## 2021-05-06 DIAGNOSIS — D631 Anemia in chronic kidney disease: Secondary | ICD-10-CM | POA: Diagnosis not present

## 2021-05-06 DIAGNOSIS — N2581 Secondary hyperparathyroidism of renal origin: Secondary | ICD-10-CM | POA: Diagnosis not present

## 2021-05-06 DIAGNOSIS — N186 End stage renal disease: Secondary | ICD-10-CM | POA: Diagnosis not present

## 2021-05-07 DIAGNOSIS — N2581 Secondary hyperparathyroidism of renal origin: Secondary | ICD-10-CM | POA: Diagnosis not present

## 2021-05-07 DIAGNOSIS — D631 Anemia in chronic kidney disease: Secondary | ICD-10-CM | POA: Diagnosis not present

## 2021-05-07 DIAGNOSIS — N186 End stage renal disease: Secondary | ICD-10-CM | POA: Diagnosis not present

## 2021-05-08 DIAGNOSIS — N186 End stage renal disease: Secondary | ICD-10-CM | POA: Diagnosis not present

## 2021-05-08 DIAGNOSIS — N2581 Secondary hyperparathyroidism of renal origin: Secondary | ICD-10-CM | POA: Diagnosis not present

## 2021-05-08 DIAGNOSIS — D631 Anemia in chronic kidney disease: Secondary | ICD-10-CM | POA: Diagnosis not present

## 2021-05-09 DIAGNOSIS — D631 Anemia in chronic kidney disease: Secondary | ICD-10-CM | POA: Diagnosis not present

## 2021-05-09 DIAGNOSIS — N2581 Secondary hyperparathyroidism of renal origin: Secondary | ICD-10-CM | POA: Diagnosis not present

## 2021-05-09 DIAGNOSIS — N186 End stage renal disease: Secondary | ICD-10-CM | POA: Diagnosis not present

## 2021-05-10 DIAGNOSIS — N2581 Secondary hyperparathyroidism of renal origin: Secondary | ICD-10-CM | POA: Diagnosis not present

## 2021-05-10 DIAGNOSIS — D631 Anemia in chronic kidney disease: Secondary | ICD-10-CM | POA: Diagnosis not present

## 2021-05-10 DIAGNOSIS — L703 Acne tropica: Secondary | ICD-10-CM | POA: Diagnosis not present

## 2021-05-10 DIAGNOSIS — C44329 Squamous cell carcinoma of skin of other parts of face: Secondary | ICD-10-CM | POA: Diagnosis not present

## 2021-05-10 DIAGNOSIS — N186 End stage renal disease: Secondary | ICD-10-CM | POA: Diagnosis not present

## 2021-05-11 DIAGNOSIS — D631 Anemia in chronic kidney disease: Secondary | ICD-10-CM | POA: Diagnosis not present

## 2021-05-11 DIAGNOSIS — N2581 Secondary hyperparathyroidism of renal origin: Secondary | ICD-10-CM | POA: Diagnosis not present

## 2021-05-11 DIAGNOSIS — N186 End stage renal disease: Secondary | ICD-10-CM | POA: Diagnosis not present

## 2021-05-12 DIAGNOSIS — N2581 Secondary hyperparathyroidism of renal origin: Secondary | ICD-10-CM | POA: Diagnosis not present

## 2021-05-12 DIAGNOSIS — N186 End stage renal disease: Secondary | ICD-10-CM | POA: Diagnosis not present

## 2021-05-12 DIAGNOSIS — D631 Anemia in chronic kidney disease: Secondary | ICD-10-CM | POA: Diagnosis not present

## 2021-05-13 DIAGNOSIS — N2581 Secondary hyperparathyroidism of renal origin: Secondary | ICD-10-CM | POA: Diagnosis not present

## 2021-05-13 DIAGNOSIS — N186 End stage renal disease: Secondary | ICD-10-CM | POA: Diagnosis not present

## 2021-05-13 DIAGNOSIS — D631 Anemia in chronic kidney disease: Secondary | ICD-10-CM | POA: Diagnosis not present

## 2021-05-14 DIAGNOSIS — N186 End stage renal disease: Secondary | ICD-10-CM | POA: Diagnosis not present

## 2021-05-14 DIAGNOSIS — D631 Anemia in chronic kidney disease: Secondary | ICD-10-CM | POA: Diagnosis not present

## 2021-05-14 DIAGNOSIS — N2581 Secondary hyperparathyroidism of renal origin: Secondary | ICD-10-CM | POA: Diagnosis not present

## 2021-05-15 DIAGNOSIS — N186 End stage renal disease: Secondary | ICD-10-CM | POA: Diagnosis not present

## 2021-05-15 DIAGNOSIS — N2581 Secondary hyperparathyroidism of renal origin: Secondary | ICD-10-CM | POA: Diagnosis not present

## 2021-05-15 DIAGNOSIS — D631 Anemia in chronic kidney disease: Secondary | ICD-10-CM | POA: Diagnosis not present

## 2021-05-16 DIAGNOSIS — N186 End stage renal disease: Secondary | ICD-10-CM | POA: Diagnosis not present

## 2021-05-16 DIAGNOSIS — D631 Anemia in chronic kidney disease: Secondary | ICD-10-CM | POA: Diagnosis not present

## 2021-05-16 DIAGNOSIS — N2581 Secondary hyperparathyroidism of renal origin: Secondary | ICD-10-CM | POA: Diagnosis not present

## 2021-05-17 DIAGNOSIS — D631 Anemia in chronic kidney disease: Secondary | ICD-10-CM | POA: Diagnosis not present

## 2021-05-17 DIAGNOSIS — N186 End stage renal disease: Secondary | ICD-10-CM | POA: Diagnosis not present

## 2021-05-17 DIAGNOSIS — N2581 Secondary hyperparathyroidism of renal origin: Secondary | ICD-10-CM | POA: Diagnosis not present

## 2021-05-18 DIAGNOSIS — I10 Essential (primary) hypertension: Secondary | ICD-10-CM | POA: Diagnosis not present

## 2021-05-18 DIAGNOSIS — N2581 Secondary hyperparathyroidism of renal origin: Secondary | ICD-10-CM | POA: Diagnosis not present

## 2021-05-18 DIAGNOSIS — D631 Anemia in chronic kidney disease: Secondary | ICD-10-CM | POA: Diagnosis not present

## 2021-05-18 DIAGNOSIS — E559 Vitamin D deficiency, unspecified: Secondary | ICD-10-CM | POA: Diagnosis not present

## 2021-05-18 DIAGNOSIS — E785 Hyperlipidemia, unspecified: Secondary | ICD-10-CM | POA: Diagnosis not present

## 2021-05-18 DIAGNOSIS — E1165 Type 2 diabetes mellitus with hyperglycemia: Secondary | ICD-10-CM | POA: Diagnosis not present

## 2021-05-18 DIAGNOSIS — N186 End stage renal disease: Secondary | ICD-10-CM | POA: Diagnosis not present

## 2021-05-19 DIAGNOSIS — D631 Anemia in chronic kidney disease: Secondary | ICD-10-CM | POA: Diagnosis not present

## 2021-05-19 DIAGNOSIS — N186 End stage renal disease: Secondary | ICD-10-CM | POA: Diagnosis not present

## 2021-05-19 DIAGNOSIS — N2581 Secondary hyperparathyroidism of renal origin: Secondary | ICD-10-CM | POA: Diagnosis not present

## 2021-05-20 DIAGNOSIS — D631 Anemia in chronic kidney disease: Secondary | ICD-10-CM | POA: Diagnosis not present

## 2021-05-20 DIAGNOSIS — N2581 Secondary hyperparathyroidism of renal origin: Secondary | ICD-10-CM | POA: Diagnosis not present

## 2021-05-20 DIAGNOSIS — N186 End stage renal disease: Secondary | ICD-10-CM | POA: Diagnosis not present

## 2021-05-21 DIAGNOSIS — N186 End stage renal disease: Secondary | ICD-10-CM | POA: Diagnosis not present

## 2021-05-21 DIAGNOSIS — N2581 Secondary hyperparathyroidism of renal origin: Secondary | ICD-10-CM | POA: Diagnosis not present

## 2021-05-21 DIAGNOSIS — D631 Anemia in chronic kidney disease: Secondary | ICD-10-CM | POA: Diagnosis not present

## 2021-05-22 DIAGNOSIS — D631 Anemia in chronic kidney disease: Secondary | ICD-10-CM | POA: Diagnosis not present

## 2021-05-22 DIAGNOSIS — N2581 Secondary hyperparathyroidism of renal origin: Secondary | ICD-10-CM | POA: Diagnosis not present

## 2021-05-22 DIAGNOSIS — N186 End stage renal disease: Secondary | ICD-10-CM | POA: Diagnosis not present

## 2021-05-23 DIAGNOSIS — Z992 Dependence on renal dialysis: Secondary | ICD-10-CM | POA: Diagnosis not present

## 2021-05-23 DIAGNOSIS — N186 End stage renal disease: Secondary | ICD-10-CM | POA: Diagnosis not present

## 2021-05-23 DIAGNOSIS — D631 Anemia in chronic kidney disease: Secondary | ICD-10-CM | POA: Diagnosis not present

## 2021-05-23 DIAGNOSIS — N2581 Secondary hyperparathyroidism of renal origin: Secondary | ICD-10-CM | POA: Diagnosis not present

## 2021-05-24 DIAGNOSIS — D631 Anemia in chronic kidney disease: Secondary | ICD-10-CM | POA: Diagnosis not present

## 2021-05-24 DIAGNOSIS — N2581 Secondary hyperparathyroidism of renal origin: Secondary | ICD-10-CM | POA: Diagnosis not present

## 2021-05-24 DIAGNOSIS — N186 End stage renal disease: Secondary | ICD-10-CM | POA: Diagnosis not present

## 2021-05-25 DIAGNOSIS — D631 Anemia in chronic kidney disease: Secondary | ICD-10-CM | POA: Diagnosis not present

## 2021-05-25 DIAGNOSIS — N2581 Secondary hyperparathyroidism of renal origin: Secondary | ICD-10-CM | POA: Diagnosis not present

## 2021-05-25 DIAGNOSIS — N186 End stage renal disease: Secondary | ICD-10-CM | POA: Diagnosis not present

## 2021-05-26 DIAGNOSIS — N186 End stage renal disease: Secondary | ICD-10-CM | POA: Diagnosis not present

## 2021-05-26 DIAGNOSIS — E1169 Type 2 diabetes mellitus with other specified complication: Secondary | ICD-10-CM | POA: Diagnosis not present

## 2021-05-26 DIAGNOSIS — I10 Essential (primary) hypertension: Secondary | ICD-10-CM | POA: Diagnosis not present

## 2021-05-26 DIAGNOSIS — N2581 Secondary hyperparathyroidism of renal origin: Secondary | ICD-10-CM | POA: Diagnosis not present

## 2021-05-26 DIAGNOSIS — Z125 Encounter for screening for malignant neoplasm of prostate: Secondary | ICD-10-CM | POA: Diagnosis not present

## 2021-05-26 DIAGNOSIS — D631 Anemia in chronic kidney disease: Secondary | ICD-10-CM | POA: Diagnosis not present

## 2021-05-27 DIAGNOSIS — N186 End stage renal disease: Secondary | ICD-10-CM | POA: Diagnosis not present

## 2021-05-27 DIAGNOSIS — N2581 Secondary hyperparathyroidism of renal origin: Secondary | ICD-10-CM | POA: Diagnosis not present

## 2021-05-27 DIAGNOSIS — D631 Anemia in chronic kidney disease: Secondary | ICD-10-CM | POA: Diagnosis not present

## 2021-05-28 DIAGNOSIS — N186 End stage renal disease: Secondary | ICD-10-CM | POA: Diagnosis not present

## 2021-05-28 DIAGNOSIS — D631 Anemia in chronic kidney disease: Secondary | ICD-10-CM | POA: Diagnosis not present

## 2021-05-28 DIAGNOSIS — N2581 Secondary hyperparathyroidism of renal origin: Secondary | ICD-10-CM | POA: Diagnosis not present

## 2021-05-29 DIAGNOSIS — N2581 Secondary hyperparathyroidism of renal origin: Secondary | ICD-10-CM | POA: Diagnosis not present

## 2021-05-29 DIAGNOSIS — N186 End stage renal disease: Secondary | ICD-10-CM | POA: Diagnosis not present

## 2021-05-29 DIAGNOSIS — D631 Anemia in chronic kidney disease: Secondary | ICD-10-CM | POA: Diagnosis not present

## 2021-05-30 DIAGNOSIS — N2581 Secondary hyperparathyroidism of renal origin: Secondary | ICD-10-CM | POA: Diagnosis not present

## 2021-05-30 DIAGNOSIS — N186 End stage renal disease: Secondary | ICD-10-CM | POA: Diagnosis not present

## 2021-05-30 DIAGNOSIS — D631 Anemia in chronic kidney disease: Secondary | ICD-10-CM | POA: Diagnosis not present

## 2021-05-31 DIAGNOSIS — E119 Type 2 diabetes mellitus without complications: Secondary | ICD-10-CM | POA: Diagnosis not present

## 2021-05-31 DIAGNOSIS — N2581 Secondary hyperparathyroidism of renal origin: Secondary | ICD-10-CM | POA: Diagnosis not present

## 2021-05-31 DIAGNOSIS — D631 Anemia in chronic kidney disease: Secondary | ICD-10-CM | POA: Diagnosis not present

## 2021-05-31 DIAGNOSIS — N186 End stage renal disease: Secondary | ICD-10-CM | POA: Diagnosis not present

## 2021-05-31 DIAGNOSIS — Z1159 Encounter for screening for other viral diseases: Secondary | ICD-10-CM | POA: Diagnosis not present

## 2021-06-01 DIAGNOSIS — N2581 Secondary hyperparathyroidism of renal origin: Secondary | ICD-10-CM | POA: Diagnosis not present

## 2021-06-01 DIAGNOSIS — N186 End stage renal disease: Secondary | ICD-10-CM | POA: Diagnosis not present

## 2021-06-01 DIAGNOSIS — D631 Anemia in chronic kidney disease: Secondary | ICD-10-CM | POA: Diagnosis not present

## 2021-06-02 DIAGNOSIS — E1169 Type 2 diabetes mellitus with other specified complication: Secondary | ICD-10-CM | POA: Diagnosis not present

## 2021-06-02 DIAGNOSIS — E785 Hyperlipidemia, unspecified: Secondary | ICD-10-CM | POA: Diagnosis not present

## 2021-06-02 DIAGNOSIS — N186 End stage renal disease: Secondary | ICD-10-CM | POA: Diagnosis not present

## 2021-06-02 DIAGNOSIS — D631 Anemia in chronic kidney disease: Secondary | ICD-10-CM | POA: Diagnosis not present

## 2021-06-02 DIAGNOSIS — N2581 Secondary hyperparathyroidism of renal origin: Secondary | ICD-10-CM | POA: Diagnosis not present

## 2021-06-02 DIAGNOSIS — Z992 Dependence on renal dialysis: Secondary | ICD-10-CM | POA: Diagnosis not present

## 2021-06-03 DIAGNOSIS — N2581 Secondary hyperparathyroidism of renal origin: Secondary | ICD-10-CM | POA: Diagnosis not present

## 2021-06-03 DIAGNOSIS — D631 Anemia in chronic kidney disease: Secondary | ICD-10-CM | POA: Diagnosis not present

## 2021-06-03 DIAGNOSIS — N186 End stage renal disease: Secondary | ICD-10-CM | POA: Diagnosis not present

## 2021-06-04 DIAGNOSIS — N2581 Secondary hyperparathyroidism of renal origin: Secondary | ICD-10-CM | POA: Diagnosis not present

## 2021-06-04 DIAGNOSIS — D631 Anemia in chronic kidney disease: Secondary | ICD-10-CM | POA: Diagnosis not present

## 2021-06-04 DIAGNOSIS — N186 End stage renal disease: Secondary | ICD-10-CM | POA: Diagnosis not present

## 2021-06-05 DIAGNOSIS — N2581 Secondary hyperparathyroidism of renal origin: Secondary | ICD-10-CM | POA: Diagnosis not present

## 2021-06-05 DIAGNOSIS — N186 End stage renal disease: Secondary | ICD-10-CM | POA: Diagnosis not present

## 2021-06-05 DIAGNOSIS — D631 Anemia in chronic kidney disease: Secondary | ICD-10-CM | POA: Diagnosis not present

## 2021-06-06 DIAGNOSIS — N186 End stage renal disease: Secondary | ICD-10-CM | POA: Diagnosis not present

## 2021-06-06 DIAGNOSIS — N2581 Secondary hyperparathyroidism of renal origin: Secondary | ICD-10-CM | POA: Diagnosis not present

## 2021-06-06 DIAGNOSIS — D631 Anemia in chronic kidney disease: Secondary | ICD-10-CM | POA: Diagnosis not present

## 2021-06-07 DIAGNOSIS — N2581 Secondary hyperparathyroidism of renal origin: Secondary | ICD-10-CM | POA: Diagnosis not present

## 2021-06-07 DIAGNOSIS — D631 Anemia in chronic kidney disease: Secondary | ICD-10-CM | POA: Diagnosis not present

## 2021-06-07 DIAGNOSIS — N186 End stage renal disease: Secondary | ICD-10-CM | POA: Diagnosis not present

## 2021-06-08 DIAGNOSIS — D631 Anemia in chronic kidney disease: Secondary | ICD-10-CM | POA: Diagnosis not present

## 2021-06-08 DIAGNOSIS — N2581 Secondary hyperparathyroidism of renal origin: Secondary | ICD-10-CM | POA: Diagnosis not present

## 2021-06-08 DIAGNOSIS — N186 End stage renal disease: Secondary | ICD-10-CM | POA: Diagnosis not present

## 2021-06-09 DIAGNOSIS — N2581 Secondary hyperparathyroidism of renal origin: Secondary | ICD-10-CM | POA: Diagnosis not present

## 2021-06-09 DIAGNOSIS — D631 Anemia in chronic kidney disease: Secondary | ICD-10-CM | POA: Diagnosis not present

## 2021-06-09 DIAGNOSIS — N186 End stage renal disease: Secondary | ICD-10-CM | POA: Diagnosis not present

## 2021-06-10 DIAGNOSIS — N2581 Secondary hyperparathyroidism of renal origin: Secondary | ICD-10-CM | POA: Diagnosis not present

## 2021-06-10 DIAGNOSIS — N186 End stage renal disease: Secondary | ICD-10-CM | POA: Diagnosis not present

## 2021-06-10 DIAGNOSIS — D631 Anemia in chronic kidney disease: Secondary | ICD-10-CM | POA: Diagnosis not present

## 2021-06-11 DIAGNOSIS — N186 End stage renal disease: Secondary | ICD-10-CM | POA: Diagnosis not present

## 2021-06-11 DIAGNOSIS — N2581 Secondary hyperparathyroidism of renal origin: Secondary | ICD-10-CM | POA: Diagnosis not present

## 2021-06-11 DIAGNOSIS — D631 Anemia in chronic kidney disease: Secondary | ICD-10-CM | POA: Diagnosis not present

## 2021-06-12 DIAGNOSIS — D631 Anemia in chronic kidney disease: Secondary | ICD-10-CM | POA: Diagnosis not present

## 2021-06-12 DIAGNOSIS — N2581 Secondary hyperparathyroidism of renal origin: Secondary | ICD-10-CM | POA: Diagnosis not present

## 2021-06-12 DIAGNOSIS — N186 End stage renal disease: Secondary | ICD-10-CM | POA: Diagnosis not present

## 2021-06-13 DIAGNOSIS — N2581 Secondary hyperparathyroidism of renal origin: Secondary | ICD-10-CM | POA: Diagnosis not present

## 2021-06-13 DIAGNOSIS — N186 End stage renal disease: Secondary | ICD-10-CM | POA: Diagnosis not present

## 2021-06-13 DIAGNOSIS — D631 Anemia in chronic kidney disease: Secondary | ICD-10-CM | POA: Diagnosis not present

## 2021-06-14 DIAGNOSIS — D631 Anemia in chronic kidney disease: Secondary | ICD-10-CM | POA: Diagnosis not present

## 2021-06-14 DIAGNOSIS — N2581 Secondary hyperparathyroidism of renal origin: Secondary | ICD-10-CM | POA: Diagnosis not present

## 2021-06-14 DIAGNOSIS — N186 End stage renal disease: Secondary | ICD-10-CM | POA: Diagnosis not present

## 2021-06-15 DIAGNOSIS — N2581 Secondary hyperparathyroidism of renal origin: Secondary | ICD-10-CM | POA: Diagnosis not present

## 2021-06-15 DIAGNOSIS — D631 Anemia in chronic kidney disease: Secondary | ICD-10-CM | POA: Diagnosis not present

## 2021-06-15 DIAGNOSIS — N186 End stage renal disease: Secondary | ICD-10-CM | POA: Diagnosis not present

## 2021-06-16 DIAGNOSIS — D631 Anemia in chronic kidney disease: Secondary | ICD-10-CM | POA: Diagnosis not present

## 2021-06-16 DIAGNOSIS — N2581 Secondary hyperparathyroidism of renal origin: Secondary | ICD-10-CM | POA: Diagnosis not present

## 2021-06-16 DIAGNOSIS — N186 End stage renal disease: Secondary | ICD-10-CM | POA: Diagnosis not present

## 2021-06-17 DIAGNOSIS — N2581 Secondary hyperparathyroidism of renal origin: Secondary | ICD-10-CM | POA: Diagnosis not present

## 2021-06-17 DIAGNOSIS — N186 End stage renal disease: Secondary | ICD-10-CM | POA: Diagnosis not present

## 2021-06-17 DIAGNOSIS — D631 Anemia in chronic kidney disease: Secondary | ICD-10-CM | POA: Diagnosis not present

## 2021-06-18 DIAGNOSIS — N2581 Secondary hyperparathyroidism of renal origin: Secondary | ICD-10-CM | POA: Diagnosis not present

## 2021-06-18 DIAGNOSIS — N186 End stage renal disease: Secondary | ICD-10-CM | POA: Diagnosis not present

## 2021-06-18 DIAGNOSIS — D631 Anemia in chronic kidney disease: Secondary | ICD-10-CM | POA: Diagnosis not present

## 2021-06-19 DIAGNOSIS — N2581 Secondary hyperparathyroidism of renal origin: Secondary | ICD-10-CM | POA: Diagnosis not present

## 2021-06-19 DIAGNOSIS — N186 End stage renal disease: Secondary | ICD-10-CM | POA: Diagnosis not present

## 2021-06-19 DIAGNOSIS — D631 Anemia in chronic kidney disease: Secondary | ICD-10-CM | POA: Diagnosis not present

## 2021-06-20 DIAGNOSIS — D631 Anemia in chronic kidney disease: Secondary | ICD-10-CM | POA: Diagnosis not present

## 2021-06-20 DIAGNOSIS — N2581 Secondary hyperparathyroidism of renal origin: Secondary | ICD-10-CM | POA: Diagnosis not present

## 2021-06-20 DIAGNOSIS — N186 End stage renal disease: Secondary | ICD-10-CM | POA: Diagnosis not present

## 2021-06-21 DIAGNOSIS — N186 End stage renal disease: Secondary | ICD-10-CM | POA: Diagnosis not present

## 2021-06-21 DIAGNOSIS — N2581 Secondary hyperparathyroidism of renal origin: Secondary | ICD-10-CM | POA: Diagnosis not present

## 2021-06-21 DIAGNOSIS — D631 Anemia in chronic kidney disease: Secondary | ICD-10-CM | POA: Diagnosis not present

## 2021-06-22 DIAGNOSIS — N2581 Secondary hyperparathyroidism of renal origin: Secondary | ICD-10-CM | POA: Diagnosis not present

## 2021-06-22 DIAGNOSIS — D631 Anemia in chronic kidney disease: Secondary | ICD-10-CM | POA: Diagnosis not present

## 2021-06-22 DIAGNOSIS — N186 End stage renal disease: Secondary | ICD-10-CM | POA: Diagnosis not present

## 2021-06-23 DIAGNOSIS — Z992 Dependence on renal dialysis: Secondary | ICD-10-CM | POA: Diagnosis not present

## 2021-06-23 DIAGNOSIS — D631 Anemia in chronic kidney disease: Secondary | ICD-10-CM | POA: Diagnosis not present

## 2021-06-23 DIAGNOSIS — N2581 Secondary hyperparathyroidism of renal origin: Secondary | ICD-10-CM | POA: Diagnosis not present

## 2021-06-23 DIAGNOSIS — N186 End stage renal disease: Secondary | ICD-10-CM | POA: Diagnosis not present

## 2021-06-24 DIAGNOSIS — N186 End stage renal disease: Secondary | ICD-10-CM | POA: Diagnosis not present

## 2021-06-24 DIAGNOSIS — D631 Anemia in chronic kidney disease: Secondary | ICD-10-CM | POA: Diagnosis not present

## 2021-06-24 DIAGNOSIS — N2581 Secondary hyperparathyroidism of renal origin: Secondary | ICD-10-CM | POA: Diagnosis not present

## 2021-06-25 DIAGNOSIS — N186 End stage renal disease: Secondary | ICD-10-CM | POA: Diagnosis not present

## 2021-06-25 DIAGNOSIS — D631 Anemia in chronic kidney disease: Secondary | ICD-10-CM | POA: Diagnosis not present

## 2021-06-25 DIAGNOSIS — N2581 Secondary hyperparathyroidism of renal origin: Secondary | ICD-10-CM | POA: Diagnosis not present

## 2021-06-26 DIAGNOSIS — D631 Anemia in chronic kidney disease: Secondary | ICD-10-CM | POA: Diagnosis not present

## 2021-06-26 DIAGNOSIS — N2581 Secondary hyperparathyroidism of renal origin: Secondary | ICD-10-CM | POA: Diagnosis not present

## 2021-06-26 DIAGNOSIS — N186 End stage renal disease: Secondary | ICD-10-CM | POA: Diagnosis not present

## 2021-06-27 DIAGNOSIS — D631 Anemia in chronic kidney disease: Secondary | ICD-10-CM | POA: Diagnosis not present

## 2021-06-27 DIAGNOSIS — N2581 Secondary hyperparathyroidism of renal origin: Secondary | ICD-10-CM | POA: Diagnosis not present

## 2021-06-27 DIAGNOSIS — N186 End stage renal disease: Secondary | ICD-10-CM | POA: Diagnosis not present

## 2021-06-28 DIAGNOSIS — N186 End stage renal disease: Secondary | ICD-10-CM | POA: Diagnosis not present

## 2021-06-28 DIAGNOSIS — N2581 Secondary hyperparathyroidism of renal origin: Secondary | ICD-10-CM | POA: Diagnosis not present

## 2021-06-28 DIAGNOSIS — D631 Anemia in chronic kidney disease: Secondary | ICD-10-CM | POA: Diagnosis not present

## 2021-06-29 DIAGNOSIS — D631 Anemia in chronic kidney disease: Secondary | ICD-10-CM | POA: Diagnosis not present

## 2021-06-29 DIAGNOSIS — N186 End stage renal disease: Secondary | ICD-10-CM | POA: Diagnosis not present

## 2021-06-29 DIAGNOSIS — N2581 Secondary hyperparathyroidism of renal origin: Secondary | ICD-10-CM | POA: Diagnosis not present

## 2021-06-30 DIAGNOSIS — E119 Type 2 diabetes mellitus without complications: Secondary | ICD-10-CM | POA: Diagnosis not present

## 2021-06-30 DIAGNOSIS — N2581 Secondary hyperparathyroidism of renal origin: Secondary | ICD-10-CM | POA: Diagnosis not present

## 2021-06-30 DIAGNOSIS — Z418 Encounter for other procedures for purposes other than remedying health state: Secondary | ICD-10-CM | POA: Diagnosis not present

## 2021-06-30 DIAGNOSIS — D631 Anemia in chronic kidney disease: Secondary | ICD-10-CM | POA: Diagnosis not present

## 2021-06-30 DIAGNOSIS — N186 End stage renal disease: Secondary | ICD-10-CM | POA: Diagnosis not present

## 2021-07-01 DIAGNOSIS — N186 End stage renal disease: Secondary | ICD-10-CM | POA: Diagnosis not present

## 2021-07-01 DIAGNOSIS — N2581 Secondary hyperparathyroidism of renal origin: Secondary | ICD-10-CM | POA: Diagnosis not present

## 2021-07-01 DIAGNOSIS — D631 Anemia in chronic kidney disease: Secondary | ICD-10-CM | POA: Diagnosis not present

## 2021-07-02 DIAGNOSIS — N186 End stage renal disease: Secondary | ICD-10-CM | POA: Diagnosis not present

## 2021-07-02 DIAGNOSIS — N2581 Secondary hyperparathyroidism of renal origin: Secondary | ICD-10-CM | POA: Diagnosis not present

## 2021-07-02 DIAGNOSIS — D631 Anemia in chronic kidney disease: Secondary | ICD-10-CM | POA: Diagnosis not present

## 2021-07-03 DIAGNOSIS — N2581 Secondary hyperparathyroidism of renal origin: Secondary | ICD-10-CM | POA: Diagnosis not present

## 2021-07-03 DIAGNOSIS — N186 End stage renal disease: Secondary | ICD-10-CM | POA: Diagnosis not present

## 2021-07-03 DIAGNOSIS — D631 Anemia in chronic kidney disease: Secondary | ICD-10-CM | POA: Diagnosis not present

## 2021-07-04 DIAGNOSIS — N2581 Secondary hyperparathyroidism of renal origin: Secondary | ICD-10-CM | POA: Diagnosis not present

## 2021-07-04 DIAGNOSIS — D631 Anemia in chronic kidney disease: Secondary | ICD-10-CM | POA: Diagnosis not present

## 2021-07-04 DIAGNOSIS — N186 End stage renal disease: Secondary | ICD-10-CM | POA: Diagnosis not present

## 2021-07-05 DIAGNOSIS — D631 Anemia in chronic kidney disease: Secondary | ICD-10-CM | POA: Diagnosis not present

## 2021-07-05 DIAGNOSIS — N2581 Secondary hyperparathyroidism of renal origin: Secondary | ICD-10-CM | POA: Diagnosis not present

## 2021-07-05 DIAGNOSIS — N186 End stage renal disease: Secondary | ICD-10-CM | POA: Diagnosis not present

## 2021-07-06 DIAGNOSIS — N2581 Secondary hyperparathyroidism of renal origin: Secondary | ICD-10-CM | POA: Diagnosis not present

## 2021-07-06 DIAGNOSIS — D631 Anemia in chronic kidney disease: Secondary | ICD-10-CM | POA: Diagnosis not present

## 2021-07-06 DIAGNOSIS — N186 End stage renal disease: Secondary | ICD-10-CM | POA: Diagnosis not present

## 2021-07-07 DIAGNOSIS — D631 Anemia in chronic kidney disease: Secondary | ICD-10-CM | POA: Diagnosis not present

## 2021-07-07 DIAGNOSIS — N2581 Secondary hyperparathyroidism of renal origin: Secondary | ICD-10-CM | POA: Diagnosis not present

## 2021-07-07 DIAGNOSIS — N186 End stage renal disease: Secondary | ICD-10-CM | POA: Diagnosis not present

## 2021-07-08 DIAGNOSIS — N186 End stage renal disease: Secondary | ICD-10-CM | POA: Diagnosis not present

## 2021-07-08 DIAGNOSIS — D631 Anemia in chronic kidney disease: Secondary | ICD-10-CM | POA: Diagnosis not present

## 2021-07-08 DIAGNOSIS — N2581 Secondary hyperparathyroidism of renal origin: Secondary | ICD-10-CM | POA: Diagnosis not present

## 2021-07-09 DIAGNOSIS — N186 End stage renal disease: Secondary | ICD-10-CM | POA: Diagnosis not present

## 2021-07-09 DIAGNOSIS — N2581 Secondary hyperparathyroidism of renal origin: Secondary | ICD-10-CM | POA: Diagnosis not present

## 2021-07-09 DIAGNOSIS — D631 Anemia in chronic kidney disease: Secondary | ICD-10-CM | POA: Diagnosis not present

## 2021-07-10 DIAGNOSIS — N186 End stage renal disease: Secondary | ICD-10-CM | POA: Diagnosis not present

## 2021-07-10 DIAGNOSIS — N2581 Secondary hyperparathyroidism of renal origin: Secondary | ICD-10-CM | POA: Diagnosis not present

## 2021-07-10 DIAGNOSIS — D631 Anemia in chronic kidney disease: Secondary | ICD-10-CM | POA: Diagnosis not present

## 2021-07-11 DIAGNOSIS — N2581 Secondary hyperparathyroidism of renal origin: Secondary | ICD-10-CM | POA: Diagnosis not present

## 2021-07-11 DIAGNOSIS — D631 Anemia in chronic kidney disease: Secondary | ICD-10-CM | POA: Diagnosis not present

## 2021-07-11 DIAGNOSIS — N186 End stage renal disease: Secondary | ICD-10-CM | POA: Diagnosis not present

## 2021-07-12 DIAGNOSIS — D631 Anemia in chronic kidney disease: Secondary | ICD-10-CM | POA: Diagnosis not present

## 2021-07-12 DIAGNOSIS — N2581 Secondary hyperparathyroidism of renal origin: Secondary | ICD-10-CM | POA: Diagnosis not present

## 2021-07-12 DIAGNOSIS — N186 End stage renal disease: Secondary | ICD-10-CM | POA: Diagnosis not present

## 2021-07-13 DIAGNOSIS — N2581 Secondary hyperparathyroidism of renal origin: Secondary | ICD-10-CM | POA: Diagnosis not present

## 2021-07-13 DIAGNOSIS — D631 Anemia in chronic kidney disease: Secondary | ICD-10-CM | POA: Diagnosis not present

## 2021-07-13 DIAGNOSIS — N186 End stage renal disease: Secondary | ICD-10-CM | POA: Diagnosis not present

## 2021-07-14 DIAGNOSIS — N2581 Secondary hyperparathyroidism of renal origin: Secondary | ICD-10-CM | POA: Diagnosis not present

## 2021-07-14 DIAGNOSIS — D631 Anemia in chronic kidney disease: Secondary | ICD-10-CM | POA: Diagnosis not present

## 2021-07-14 DIAGNOSIS — N186 End stage renal disease: Secondary | ICD-10-CM | POA: Diagnosis not present

## 2021-07-15 DIAGNOSIS — N2581 Secondary hyperparathyroidism of renal origin: Secondary | ICD-10-CM | POA: Diagnosis not present

## 2021-07-15 DIAGNOSIS — D631 Anemia in chronic kidney disease: Secondary | ICD-10-CM | POA: Diagnosis not present

## 2021-07-15 DIAGNOSIS — N186 End stage renal disease: Secondary | ICD-10-CM | POA: Diagnosis not present

## 2021-07-16 DIAGNOSIS — N2581 Secondary hyperparathyroidism of renal origin: Secondary | ICD-10-CM | POA: Diagnosis not present

## 2021-07-16 DIAGNOSIS — N186 End stage renal disease: Secondary | ICD-10-CM | POA: Diagnosis not present

## 2021-07-16 DIAGNOSIS — D631 Anemia in chronic kidney disease: Secondary | ICD-10-CM | POA: Diagnosis not present

## 2021-07-17 DIAGNOSIS — N186 End stage renal disease: Secondary | ICD-10-CM | POA: Diagnosis not present

## 2021-07-17 DIAGNOSIS — D631 Anemia in chronic kidney disease: Secondary | ICD-10-CM | POA: Diagnosis not present

## 2021-07-17 DIAGNOSIS — N2581 Secondary hyperparathyroidism of renal origin: Secondary | ICD-10-CM | POA: Diagnosis not present

## 2021-07-18 DIAGNOSIS — N2581 Secondary hyperparathyroidism of renal origin: Secondary | ICD-10-CM | POA: Diagnosis not present

## 2021-07-18 DIAGNOSIS — D631 Anemia in chronic kidney disease: Secondary | ICD-10-CM | POA: Diagnosis not present

## 2021-07-18 DIAGNOSIS — N186 End stage renal disease: Secondary | ICD-10-CM | POA: Diagnosis not present

## 2021-07-19 DIAGNOSIS — D631 Anemia in chronic kidney disease: Secondary | ICD-10-CM | POA: Diagnosis not present

## 2021-07-19 DIAGNOSIS — N2581 Secondary hyperparathyroidism of renal origin: Secondary | ICD-10-CM | POA: Diagnosis not present

## 2021-07-19 DIAGNOSIS — N186 End stage renal disease: Secondary | ICD-10-CM | POA: Diagnosis not present

## 2021-07-20 DIAGNOSIS — N2581 Secondary hyperparathyroidism of renal origin: Secondary | ICD-10-CM | POA: Diagnosis not present

## 2021-07-20 DIAGNOSIS — N186 End stage renal disease: Secondary | ICD-10-CM | POA: Diagnosis not present

## 2021-07-20 DIAGNOSIS — D631 Anemia in chronic kidney disease: Secondary | ICD-10-CM | POA: Diagnosis not present

## 2021-07-21 DIAGNOSIS — D631 Anemia in chronic kidney disease: Secondary | ICD-10-CM | POA: Diagnosis not present

## 2021-07-21 DIAGNOSIS — N2581 Secondary hyperparathyroidism of renal origin: Secondary | ICD-10-CM | POA: Diagnosis not present

## 2021-07-21 DIAGNOSIS — N186 End stage renal disease: Secondary | ICD-10-CM | POA: Diagnosis not present

## 2021-07-22 DIAGNOSIS — N2581 Secondary hyperparathyroidism of renal origin: Secondary | ICD-10-CM | POA: Diagnosis not present

## 2021-07-22 DIAGNOSIS — D631 Anemia in chronic kidney disease: Secondary | ICD-10-CM | POA: Diagnosis not present

## 2021-07-22 DIAGNOSIS — N186 End stage renal disease: Secondary | ICD-10-CM | POA: Diagnosis not present

## 2021-07-23 DIAGNOSIS — D631 Anemia in chronic kidney disease: Secondary | ICD-10-CM | POA: Diagnosis not present

## 2021-07-23 DIAGNOSIS — Z992 Dependence on renal dialysis: Secondary | ICD-10-CM | POA: Diagnosis not present

## 2021-07-23 DIAGNOSIS — N186 End stage renal disease: Secondary | ICD-10-CM | POA: Diagnosis not present

## 2021-07-23 DIAGNOSIS — N2581 Secondary hyperparathyroidism of renal origin: Secondary | ICD-10-CM | POA: Diagnosis not present

## 2021-07-24 DIAGNOSIS — N186 End stage renal disease: Secondary | ICD-10-CM | POA: Diagnosis not present

## 2021-07-24 DIAGNOSIS — D631 Anemia in chronic kidney disease: Secondary | ICD-10-CM | POA: Diagnosis not present

## 2021-07-24 DIAGNOSIS — N2581 Secondary hyperparathyroidism of renal origin: Secondary | ICD-10-CM | POA: Diagnosis not present

## 2021-07-25 DIAGNOSIS — N186 End stage renal disease: Secondary | ICD-10-CM | POA: Diagnosis not present

## 2021-07-25 DIAGNOSIS — N2581 Secondary hyperparathyroidism of renal origin: Secondary | ICD-10-CM | POA: Diagnosis not present

## 2021-07-25 DIAGNOSIS — D631 Anemia in chronic kidney disease: Secondary | ICD-10-CM | POA: Diagnosis not present

## 2021-07-26 DIAGNOSIS — N2581 Secondary hyperparathyroidism of renal origin: Secondary | ICD-10-CM | POA: Diagnosis not present

## 2021-07-26 DIAGNOSIS — D631 Anemia in chronic kidney disease: Secondary | ICD-10-CM | POA: Diagnosis not present

## 2021-07-26 DIAGNOSIS — N186 End stage renal disease: Secondary | ICD-10-CM | POA: Diagnosis not present

## 2021-07-27 DIAGNOSIS — N2581 Secondary hyperparathyroidism of renal origin: Secondary | ICD-10-CM | POA: Diagnosis not present

## 2021-07-27 DIAGNOSIS — D631 Anemia in chronic kidney disease: Secondary | ICD-10-CM | POA: Diagnosis not present

## 2021-07-27 DIAGNOSIS — N186 End stage renal disease: Secondary | ICD-10-CM | POA: Diagnosis not present

## 2021-07-28 DIAGNOSIS — N186 End stage renal disease: Secondary | ICD-10-CM | POA: Diagnosis not present

## 2021-07-28 DIAGNOSIS — E119 Type 2 diabetes mellitus without complications: Secondary | ICD-10-CM | POA: Diagnosis not present

## 2021-07-28 DIAGNOSIS — D631 Anemia in chronic kidney disease: Secondary | ICD-10-CM | POA: Diagnosis not present

## 2021-07-28 DIAGNOSIS — N2581 Secondary hyperparathyroidism of renal origin: Secondary | ICD-10-CM | POA: Diagnosis not present

## 2021-07-29 DIAGNOSIS — N186 End stage renal disease: Secondary | ICD-10-CM | POA: Diagnosis not present

## 2021-07-29 DIAGNOSIS — N2581 Secondary hyperparathyroidism of renal origin: Secondary | ICD-10-CM | POA: Diagnosis not present

## 2021-07-29 DIAGNOSIS — D631 Anemia in chronic kidney disease: Secondary | ICD-10-CM | POA: Diagnosis not present

## 2021-07-30 DIAGNOSIS — D631 Anemia in chronic kidney disease: Secondary | ICD-10-CM | POA: Diagnosis not present

## 2021-07-30 DIAGNOSIS — N186 End stage renal disease: Secondary | ICD-10-CM | POA: Diagnosis not present

## 2021-07-30 DIAGNOSIS — N2581 Secondary hyperparathyroidism of renal origin: Secondary | ICD-10-CM | POA: Diagnosis not present

## 2021-07-31 DIAGNOSIS — N2581 Secondary hyperparathyroidism of renal origin: Secondary | ICD-10-CM | POA: Diagnosis not present

## 2021-07-31 DIAGNOSIS — D631 Anemia in chronic kidney disease: Secondary | ICD-10-CM | POA: Diagnosis not present

## 2021-07-31 DIAGNOSIS — N186 End stage renal disease: Secondary | ICD-10-CM | POA: Diagnosis not present

## 2021-08-01 DIAGNOSIS — N186 End stage renal disease: Secondary | ICD-10-CM | POA: Diagnosis not present

## 2021-08-01 DIAGNOSIS — N2581 Secondary hyperparathyroidism of renal origin: Secondary | ICD-10-CM | POA: Diagnosis not present

## 2021-08-01 DIAGNOSIS — D631 Anemia in chronic kidney disease: Secondary | ICD-10-CM | POA: Diagnosis not present

## 2021-08-02 DIAGNOSIS — N2581 Secondary hyperparathyroidism of renal origin: Secondary | ICD-10-CM | POA: Diagnosis not present

## 2021-08-02 DIAGNOSIS — N186 End stage renal disease: Secondary | ICD-10-CM | POA: Diagnosis not present

## 2021-08-02 DIAGNOSIS — D631 Anemia in chronic kidney disease: Secondary | ICD-10-CM | POA: Diagnosis not present

## 2021-08-03 DIAGNOSIS — N2581 Secondary hyperparathyroidism of renal origin: Secondary | ICD-10-CM | POA: Diagnosis not present

## 2021-08-03 DIAGNOSIS — N186 End stage renal disease: Secondary | ICD-10-CM | POA: Diagnosis not present

## 2021-08-03 DIAGNOSIS — D631 Anemia in chronic kidney disease: Secondary | ICD-10-CM | POA: Diagnosis not present

## 2021-08-04 DIAGNOSIS — N2581 Secondary hyperparathyroidism of renal origin: Secondary | ICD-10-CM | POA: Diagnosis not present

## 2021-08-04 DIAGNOSIS — N186 End stage renal disease: Secondary | ICD-10-CM | POA: Diagnosis not present

## 2021-08-04 DIAGNOSIS — D631 Anemia in chronic kidney disease: Secondary | ICD-10-CM | POA: Diagnosis not present

## 2021-08-05 DIAGNOSIS — N2581 Secondary hyperparathyroidism of renal origin: Secondary | ICD-10-CM | POA: Diagnosis not present

## 2021-08-05 DIAGNOSIS — N186 End stage renal disease: Secondary | ICD-10-CM | POA: Diagnosis not present

## 2021-08-05 DIAGNOSIS — D631 Anemia in chronic kidney disease: Secondary | ICD-10-CM | POA: Diagnosis not present

## 2021-08-06 DIAGNOSIS — D631 Anemia in chronic kidney disease: Secondary | ICD-10-CM | POA: Diagnosis not present

## 2021-08-06 DIAGNOSIS — N2581 Secondary hyperparathyroidism of renal origin: Secondary | ICD-10-CM | POA: Diagnosis not present

## 2021-08-06 DIAGNOSIS — N186 End stage renal disease: Secondary | ICD-10-CM | POA: Diagnosis not present

## 2021-08-07 DIAGNOSIS — N186 End stage renal disease: Secondary | ICD-10-CM | POA: Diagnosis not present

## 2021-08-07 DIAGNOSIS — N2581 Secondary hyperparathyroidism of renal origin: Secondary | ICD-10-CM | POA: Diagnosis not present

## 2021-08-07 DIAGNOSIS — D631 Anemia in chronic kidney disease: Secondary | ICD-10-CM | POA: Diagnosis not present

## 2021-08-08 DIAGNOSIS — D631 Anemia in chronic kidney disease: Secondary | ICD-10-CM | POA: Diagnosis not present

## 2021-08-08 DIAGNOSIS — N186 End stage renal disease: Secondary | ICD-10-CM | POA: Diagnosis not present

## 2021-08-08 DIAGNOSIS — N2581 Secondary hyperparathyroidism of renal origin: Secondary | ICD-10-CM | POA: Diagnosis not present

## 2021-08-09 DIAGNOSIS — D631 Anemia in chronic kidney disease: Secondary | ICD-10-CM | POA: Diagnosis not present

## 2021-08-09 DIAGNOSIS — N2581 Secondary hyperparathyroidism of renal origin: Secondary | ICD-10-CM | POA: Diagnosis not present

## 2021-08-09 DIAGNOSIS — N186 End stage renal disease: Secondary | ICD-10-CM | POA: Diagnosis not present

## 2021-08-10 DIAGNOSIS — D631 Anemia in chronic kidney disease: Secondary | ICD-10-CM | POA: Diagnosis not present

## 2021-08-10 DIAGNOSIS — N2581 Secondary hyperparathyroidism of renal origin: Secondary | ICD-10-CM | POA: Diagnosis not present

## 2021-08-10 DIAGNOSIS — N186 End stage renal disease: Secondary | ICD-10-CM | POA: Diagnosis not present

## 2021-08-11 DIAGNOSIS — D631 Anemia in chronic kidney disease: Secondary | ICD-10-CM | POA: Diagnosis not present

## 2021-08-11 DIAGNOSIS — N2581 Secondary hyperparathyroidism of renal origin: Secondary | ICD-10-CM | POA: Diagnosis not present

## 2021-08-11 DIAGNOSIS — N186 End stage renal disease: Secondary | ICD-10-CM | POA: Diagnosis not present

## 2021-08-12 DIAGNOSIS — N186 End stage renal disease: Secondary | ICD-10-CM | POA: Diagnosis not present

## 2021-08-12 DIAGNOSIS — D631 Anemia in chronic kidney disease: Secondary | ICD-10-CM | POA: Diagnosis not present

## 2021-08-12 DIAGNOSIS — N2581 Secondary hyperparathyroidism of renal origin: Secondary | ICD-10-CM | POA: Diagnosis not present

## 2021-08-13 DIAGNOSIS — N2581 Secondary hyperparathyroidism of renal origin: Secondary | ICD-10-CM | POA: Diagnosis not present

## 2021-08-13 DIAGNOSIS — D631 Anemia in chronic kidney disease: Secondary | ICD-10-CM | POA: Diagnosis not present

## 2021-08-13 DIAGNOSIS — N186 End stage renal disease: Secondary | ICD-10-CM | POA: Diagnosis not present

## 2021-08-14 DIAGNOSIS — D631 Anemia in chronic kidney disease: Secondary | ICD-10-CM | POA: Diagnosis not present

## 2021-08-14 DIAGNOSIS — N2581 Secondary hyperparathyroidism of renal origin: Secondary | ICD-10-CM | POA: Diagnosis not present

## 2021-08-14 DIAGNOSIS — N186 End stage renal disease: Secondary | ICD-10-CM | POA: Diagnosis not present

## 2021-08-15 DIAGNOSIS — R748 Abnormal levels of other serum enzymes: Secondary | ICD-10-CM | POA: Diagnosis not present

## 2021-08-15 DIAGNOSIS — D631 Anemia in chronic kidney disease: Secondary | ICD-10-CM | POA: Diagnosis not present

## 2021-08-15 DIAGNOSIS — R197 Diarrhea, unspecified: Secondary | ICD-10-CM | POA: Diagnosis not present

## 2021-08-15 DIAGNOSIS — Z992 Dependence on renal dialysis: Secondary | ICD-10-CM | POA: Diagnosis not present

## 2021-08-15 DIAGNOSIS — N2581 Secondary hyperparathyroidism of renal origin: Secondary | ICD-10-CM | POA: Diagnosis not present

## 2021-08-15 DIAGNOSIS — E878 Other disorders of electrolyte and fluid balance, not elsewhere classified: Secondary | ICD-10-CM | POA: Diagnosis not present

## 2021-08-15 DIAGNOSIS — R7989 Other specified abnormal findings of blood chemistry: Secondary | ICD-10-CM | POA: Diagnosis not present

## 2021-08-15 DIAGNOSIS — E871 Hypo-osmolality and hyponatremia: Secondary | ICD-10-CM | POA: Diagnosis not present

## 2021-08-15 DIAGNOSIS — K6289 Other specified diseases of anus and rectum: Secondary | ICD-10-CM | POA: Diagnosis not present

## 2021-08-15 DIAGNOSIS — N186 End stage renal disease: Secondary | ICD-10-CM | POA: Diagnosis not present

## 2021-08-16 DIAGNOSIS — N186 End stage renal disease: Secondary | ICD-10-CM | POA: Diagnosis not present

## 2021-08-16 DIAGNOSIS — D631 Anemia in chronic kidney disease: Secondary | ICD-10-CM | POA: Diagnosis not present

## 2021-08-16 DIAGNOSIS — N2581 Secondary hyperparathyroidism of renal origin: Secondary | ICD-10-CM | POA: Diagnosis not present

## 2021-08-17 DIAGNOSIS — N2581 Secondary hyperparathyroidism of renal origin: Secondary | ICD-10-CM | POA: Diagnosis not present

## 2021-08-17 DIAGNOSIS — N186 End stage renal disease: Secondary | ICD-10-CM | POA: Diagnosis not present

## 2021-08-17 DIAGNOSIS — D631 Anemia in chronic kidney disease: Secondary | ICD-10-CM | POA: Diagnosis not present

## 2021-08-18 DIAGNOSIS — D631 Anemia in chronic kidney disease: Secondary | ICD-10-CM | POA: Diagnosis not present

## 2021-08-18 DIAGNOSIS — N2581 Secondary hyperparathyroidism of renal origin: Secondary | ICD-10-CM | POA: Diagnosis not present

## 2021-08-18 DIAGNOSIS — N186 End stage renal disease: Secondary | ICD-10-CM | POA: Diagnosis not present

## 2021-08-19 DIAGNOSIS — N186 End stage renal disease: Secondary | ICD-10-CM | POA: Diagnosis not present

## 2021-08-19 DIAGNOSIS — D631 Anemia in chronic kidney disease: Secondary | ICD-10-CM | POA: Diagnosis not present

## 2021-08-19 DIAGNOSIS — N2581 Secondary hyperparathyroidism of renal origin: Secondary | ICD-10-CM | POA: Diagnosis not present

## 2021-08-20 DIAGNOSIS — D631 Anemia in chronic kidney disease: Secondary | ICD-10-CM | POA: Diagnosis not present

## 2021-08-20 DIAGNOSIS — N186 End stage renal disease: Secondary | ICD-10-CM | POA: Diagnosis not present

## 2021-08-20 DIAGNOSIS — N2581 Secondary hyperparathyroidism of renal origin: Secondary | ICD-10-CM | POA: Diagnosis not present

## 2021-08-21 DIAGNOSIS — N2581 Secondary hyperparathyroidism of renal origin: Secondary | ICD-10-CM | POA: Diagnosis not present

## 2021-08-21 DIAGNOSIS — D631 Anemia in chronic kidney disease: Secondary | ICD-10-CM | POA: Diagnosis not present

## 2021-08-21 DIAGNOSIS — N186 End stage renal disease: Secondary | ICD-10-CM | POA: Diagnosis not present

## 2021-08-22 DIAGNOSIS — D631 Anemia in chronic kidney disease: Secondary | ICD-10-CM | POA: Diagnosis not present

## 2021-08-22 DIAGNOSIS — N2581 Secondary hyperparathyroidism of renal origin: Secondary | ICD-10-CM | POA: Diagnosis not present

## 2021-08-22 DIAGNOSIS — N186 End stage renal disease: Secondary | ICD-10-CM | POA: Diagnosis not present

## 2021-08-23 DIAGNOSIS — N186 End stage renal disease: Secondary | ICD-10-CM | POA: Diagnosis not present

## 2021-08-23 DIAGNOSIS — N2581 Secondary hyperparathyroidism of renal origin: Secondary | ICD-10-CM | POA: Diagnosis not present

## 2021-08-23 DIAGNOSIS — Z992 Dependence on renal dialysis: Secondary | ICD-10-CM | POA: Diagnosis not present

## 2021-08-23 DIAGNOSIS — D631 Anemia in chronic kidney disease: Secondary | ICD-10-CM | POA: Diagnosis not present

## 2021-08-24 DIAGNOSIS — N186 End stage renal disease: Secondary | ICD-10-CM | POA: Diagnosis not present

## 2021-08-24 DIAGNOSIS — N2581 Secondary hyperparathyroidism of renal origin: Secondary | ICD-10-CM | POA: Diagnosis not present

## 2021-08-24 DIAGNOSIS — D631 Anemia in chronic kidney disease: Secondary | ICD-10-CM | POA: Diagnosis not present

## 2021-08-25 DIAGNOSIS — D631 Anemia in chronic kidney disease: Secondary | ICD-10-CM | POA: Diagnosis not present

## 2021-08-25 DIAGNOSIS — N2581 Secondary hyperparathyroidism of renal origin: Secondary | ICD-10-CM | POA: Diagnosis not present

## 2021-08-25 DIAGNOSIS — N186 End stage renal disease: Secondary | ICD-10-CM | POA: Diagnosis not present

## 2021-08-26 DIAGNOSIS — D631 Anemia in chronic kidney disease: Secondary | ICD-10-CM | POA: Diagnosis not present

## 2021-08-26 DIAGNOSIS — N2581 Secondary hyperparathyroidism of renal origin: Secondary | ICD-10-CM | POA: Diagnosis not present

## 2021-08-26 DIAGNOSIS — N186 End stage renal disease: Secondary | ICD-10-CM | POA: Diagnosis not present

## 2021-08-27 DIAGNOSIS — D631 Anemia in chronic kidney disease: Secondary | ICD-10-CM | POA: Diagnosis not present

## 2021-08-27 DIAGNOSIS — N2581 Secondary hyperparathyroidism of renal origin: Secondary | ICD-10-CM | POA: Diagnosis not present

## 2021-08-27 DIAGNOSIS — N186 End stage renal disease: Secondary | ICD-10-CM | POA: Diagnosis not present

## 2021-08-28 DIAGNOSIS — N186 End stage renal disease: Secondary | ICD-10-CM | POA: Diagnosis not present

## 2021-08-28 DIAGNOSIS — D631 Anemia in chronic kidney disease: Secondary | ICD-10-CM | POA: Diagnosis not present

## 2021-08-28 DIAGNOSIS — N2581 Secondary hyperparathyroidism of renal origin: Secondary | ICD-10-CM | POA: Diagnosis not present

## 2021-08-28 DIAGNOSIS — E119 Type 2 diabetes mellitus without complications: Secondary | ICD-10-CM | POA: Diagnosis not present

## 2021-08-29 DIAGNOSIS — D631 Anemia in chronic kidney disease: Secondary | ICD-10-CM | POA: Diagnosis not present

## 2021-08-29 DIAGNOSIS — N186 End stage renal disease: Secondary | ICD-10-CM | POA: Diagnosis not present

## 2021-08-29 DIAGNOSIS — N2581 Secondary hyperparathyroidism of renal origin: Secondary | ICD-10-CM | POA: Diagnosis not present

## 2021-08-30 DIAGNOSIS — N2581 Secondary hyperparathyroidism of renal origin: Secondary | ICD-10-CM | POA: Diagnosis not present

## 2021-08-30 DIAGNOSIS — D631 Anemia in chronic kidney disease: Secondary | ICD-10-CM | POA: Diagnosis not present

## 2021-08-30 DIAGNOSIS — N186 End stage renal disease: Secondary | ICD-10-CM | POA: Diagnosis not present

## 2021-08-31 DIAGNOSIS — N2581 Secondary hyperparathyroidism of renal origin: Secondary | ICD-10-CM | POA: Diagnosis not present

## 2021-08-31 DIAGNOSIS — D631 Anemia in chronic kidney disease: Secondary | ICD-10-CM | POA: Diagnosis not present

## 2021-08-31 DIAGNOSIS — N186 End stage renal disease: Secondary | ICD-10-CM | POA: Diagnosis not present

## 2021-09-01 DIAGNOSIS — D7589 Other specified diseases of blood and blood-forming organs: Secondary | ICD-10-CM | POA: Diagnosis not present

## 2021-09-01 DIAGNOSIS — K7460 Unspecified cirrhosis of liver: Secondary | ICD-10-CM | POA: Diagnosis not present

## 2021-09-01 DIAGNOSIS — K6289 Other specified diseases of anus and rectum: Secondary | ICD-10-CM | POA: Diagnosis not present

## 2021-09-01 DIAGNOSIS — N2581 Secondary hyperparathyroidism of renal origin: Secondary | ICD-10-CM | POA: Diagnosis not present

## 2021-09-01 DIAGNOSIS — D631 Anemia in chronic kidney disease: Secondary | ICD-10-CM | POA: Diagnosis not present

## 2021-09-01 DIAGNOSIS — R160 Hepatomegaly, not elsewhere classified: Secondary | ICD-10-CM | POA: Diagnosis not present

## 2021-09-01 DIAGNOSIS — N186 End stage renal disease: Secondary | ICD-10-CM | POA: Diagnosis not present

## 2021-09-02 DIAGNOSIS — N186 End stage renal disease: Secondary | ICD-10-CM | POA: Diagnosis not present

## 2021-09-02 DIAGNOSIS — N2581 Secondary hyperparathyroidism of renal origin: Secondary | ICD-10-CM | POA: Diagnosis not present

## 2021-09-02 DIAGNOSIS — D631 Anemia in chronic kidney disease: Secondary | ICD-10-CM | POA: Diagnosis not present

## 2021-09-03 DIAGNOSIS — N186 End stage renal disease: Secondary | ICD-10-CM | POA: Diagnosis not present

## 2021-09-03 DIAGNOSIS — N2581 Secondary hyperparathyroidism of renal origin: Secondary | ICD-10-CM | POA: Diagnosis not present

## 2021-09-03 DIAGNOSIS — D631 Anemia in chronic kidney disease: Secondary | ICD-10-CM | POA: Diagnosis not present

## 2021-09-04 DIAGNOSIS — N2581 Secondary hyperparathyroidism of renal origin: Secondary | ICD-10-CM | POA: Diagnosis not present

## 2021-09-04 DIAGNOSIS — D631 Anemia in chronic kidney disease: Secondary | ICD-10-CM | POA: Diagnosis not present

## 2021-09-04 DIAGNOSIS — N186 End stage renal disease: Secondary | ICD-10-CM | POA: Diagnosis not present

## 2021-09-05 DIAGNOSIS — N2581 Secondary hyperparathyroidism of renal origin: Secondary | ICD-10-CM | POA: Diagnosis not present

## 2021-09-05 DIAGNOSIS — D631 Anemia in chronic kidney disease: Secondary | ICD-10-CM | POA: Diagnosis not present

## 2021-09-05 DIAGNOSIS — N186 End stage renal disease: Secondary | ICD-10-CM | POA: Diagnosis not present

## 2021-09-06 DIAGNOSIS — D631 Anemia in chronic kidney disease: Secondary | ICD-10-CM | POA: Diagnosis not present

## 2021-09-06 DIAGNOSIS — N2581 Secondary hyperparathyroidism of renal origin: Secondary | ICD-10-CM | POA: Diagnosis not present

## 2021-09-06 DIAGNOSIS — N186 End stage renal disease: Secondary | ICD-10-CM | POA: Diagnosis not present

## 2021-09-07 DIAGNOSIS — N2581 Secondary hyperparathyroidism of renal origin: Secondary | ICD-10-CM | POA: Diagnosis not present

## 2021-09-07 DIAGNOSIS — D631 Anemia in chronic kidney disease: Secondary | ICD-10-CM | POA: Diagnosis not present

## 2021-09-07 DIAGNOSIS — N186 End stage renal disease: Secondary | ICD-10-CM | POA: Diagnosis not present

## 2021-09-08 DIAGNOSIS — D631 Anemia in chronic kidney disease: Secondary | ICD-10-CM | POA: Diagnosis not present

## 2021-09-08 DIAGNOSIS — N186 End stage renal disease: Secondary | ICD-10-CM | POA: Diagnosis not present

## 2021-09-08 DIAGNOSIS — N2581 Secondary hyperparathyroidism of renal origin: Secondary | ICD-10-CM | POA: Diagnosis not present

## 2021-09-09 DIAGNOSIS — N2581 Secondary hyperparathyroidism of renal origin: Secondary | ICD-10-CM | POA: Diagnosis not present

## 2021-09-09 DIAGNOSIS — D631 Anemia in chronic kidney disease: Secondary | ICD-10-CM | POA: Diagnosis not present

## 2021-09-09 DIAGNOSIS — N186 End stage renal disease: Secondary | ICD-10-CM | POA: Diagnosis not present

## 2021-09-10 DIAGNOSIS — N186 End stage renal disease: Secondary | ICD-10-CM | POA: Diagnosis not present

## 2021-09-10 DIAGNOSIS — D631 Anemia in chronic kidney disease: Secondary | ICD-10-CM | POA: Diagnosis not present

## 2021-09-10 DIAGNOSIS — N2581 Secondary hyperparathyroidism of renal origin: Secondary | ICD-10-CM | POA: Diagnosis not present

## 2021-09-11 DIAGNOSIS — N2581 Secondary hyperparathyroidism of renal origin: Secondary | ICD-10-CM | POA: Diagnosis not present

## 2021-09-11 DIAGNOSIS — N186 End stage renal disease: Secondary | ICD-10-CM | POA: Diagnosis not present

## 2021-09-11 DIAGNOSIS — D631 Anemia in chronic kidney disease: Secondary | ICD-10-CM | POA: Diagnosis not present

## 2021-09-12 DIAGNOSIS — N186 End stage renal disease: Secondary | ICD-10-CM | POA: Diagnosis not present

## 2021-09-12 DIAGNOSIS — N2581 Secondary hyperparathyroidism of renal origin: Secondary | ICD-10-CM | POA: Diagnosis not present

## 2021-09-12 DIAGNOSIS — D631 Anemia in chronic kidney disease: Secondary | ICD-10-CM | POA: Diagnosis not present

## 2021-09-13 DIAGNOSIS — N2581 Secondary hyperparathyroidism of renal origin: Secondary | ICD-10-CM | POA: Diagnosis not present

## 2021-09-13 DIAGNOSIS — N186 End stage renal disease: Secondary | ICD-10-CM | POA: Diagnosis not present

## 2021-09-13 DIAGNOSIS — D631 Anemia in chronic kidney disease: Secondary | ICD-10-CM | POA: Diagnosis not present

## 2021-09-14 DIAGNOSIS — N2581 Secondary hyperparathyroidism of renal origin: Secondary | ICD-10-CM | POA: Diagnosis not present

## 2021-09-14 DIAGNOSIS — D631 Anemia in chronic kidney disease: Secondary | ICD-10-CM | POA: Diagnosis not present

## 2021-09-14 DIAGNOSIS — N186 End stage renal disease: Secondary | ICD-10-CM | POA: Diagnosis not present

## 2021-09-15 DIAGNOSIS — D631 Anemia in chronic kidney disease: Secondary | ICD-10-CM | POA: Diagnosis not present

## 2021-09-15 DIAGNOSIS — N186 End stage renal disease: Secondary | ICD-10-CM | POA: Diagnosis not present

## 2021-09-15 DIAGNOSIS — N2581 Secondary hyperparathyroidism of renal origin: Secondary | ICD-10-CM | POA: Diagnosis not present

## 2021-09-16 DIAGNOSIS — D631 Anemia in chronic kidney disease: Secondary | ICD-10-CM | POA: Diagnosis not present

## 2021-09-16 DIAGNOSIS — N2581 Secondary hyperparathyroidism of renal origin: Secondary | ICD-10-CM | POA: Diagnosis not present

## 2021-09-16 DIAGNOSIS — N186 End stage renal disease: Secondary | ICD-10-CM | POA: Diagnosis not present

## 2021-09-17 DIAGNOSIS — N186 End stage renal disease: Secondary | ICD-10-CM | POA: Diagnosis not present

## 2021-09-17 DIAGNOSIS — N2581 Secondary hyperparathyroidism of renal origin: Secondary | ICD-10-CM | POA: Diagnosis not present

## 2021-09-17 DIAGNOSIS — D631 Anemia in chronic kidney disease: Secondary | ICD-10-CM | POA: Diagnosis not present

## 2021-09-18 DIAGNOSIS — N186 End stage renal disease: Secondary | ICD-10-CM | POA: Diagnosis not present

## 2021-09-18 DIAGNOSIS — N2581 Secondary hyperparathyroidism of renal origin: Secondary | ICD-10-CM | POA: Diagnosis not present

## 2021-09-18 DIAGNOSIS — D631 Anemia in chronic kidney disease: Secondary | ICD-10-CM | POA: Diagnosis not present

## 2021-09-19 DIAGNOSIS — N2581 Secondary hyperparathyroidism of renal origin: Secondary | ICD-10-CM | POA: Diagnosis not present

## 2021-09-19 DIAGNOSIS — N186 End stage renal disease: Secondary | ICD-10-CM | POA: Diagnosis not present

## 2021-09-19 DIAGNOSIS — D631 Anemia in chronic kidney disease: Secondary | ICD-10-CM | POA: Diagnosis not present

## 2021-09-20 DIAGNOSIS — D631 Anemia in chronic kidney disease: Secondary | ICD-10-CM | POA: Diagnosis not present

## 2021-09-20 DIAGNOSIS — N2581 Secondary hyperparathyroidism of renal origin: Secondary | ICD-10-CM | POA: Diagnosis not present

## 2021-09-20 DIAGNOSIS — N186 End stage renal disease: Secondary | ICD-10-CM | POA: Diagnosis not present

## 2021-09-21 DIAGNOSIS — N2581 Secondary hyperparathyroidism of renal origin: Secondary | ICD-10-CM | POA: Diagnosis not present

## 2021-09-21 DIAGNOSIS — N186 End stage renal disease: Secondary | ICD-10-CM | POA: Diagnosis not present

## 2021-09-21 DIAGNOSIS — D631 Anemia in chronic kidney disease: Secondary | ICD-10-CM | POA: Diagnosis not present

## 2021-09-22 DIAGNOSIS — Z992 Dependence on renal dialysis: Secondary | ICD-10-CM | POA: Diagnosis not present

## 2021-09-22 DIAGNOSIS — N186 End stage renal disease: Secondary | ICD-10-CM | POA: Diagnosis not present

## 2021-09-22 DIAGNOSIS — D631 Anemia in chronic kidney disease: Secondary | ICD-10-CM | POA: Diagnosis not present

## 2021-09-22 DIAGNOSIS — N2581 Secondary hyperparathyroidism of renal origin: Secondary | ICD-10-CM | POA: Diagnosis not present

## 2021-09-23 DIAGNOSIS — N2581 Secondary hyperparathyroidism of renal origin: Secondary | ICD-10-CM | POA: Diagnosis not present

## 2021-09-23 DIAGNOSIS — D631 Anemia in chronic kidney disease: Secondary | ICD-10-CM | POA: Diagnosis not present

## 2021-09-23 DIAGNOSIS — N186 End stage renal disease: Secondary | ICD-10-CM | POA: Diagnosis not present

## 2021-09-24 DIAGNOSIS — N2581 Secondary hyperparathyroidism of renal origin: Secondary | ICD-10-CM | POA: Diagnosis not present

## 2021-09-24 DIAGNOSIS — N186 End stage renal disease: Secondary | ICD-10-CM | POA: Diagnosis not present

## 2021-09-24 DIAGNOSIS — D631 Anemia in chronic kidney disease: Secondary | ICD-10-CM | POA: Diagnosis not present

## 2021-09-25 DIAGNOSIS — N186 End stage renal disease: Secondary | ICD-10-CM | POA: Diagnosis not present

## 2021-09-25 DIAGNOSIS — D631 Anemia in chronic kidney disease: Secondary | ICD-10-CM | POA: Diagnosis not present

## 2021-09-25 DIAGNOSIS — N2581 Secondary hyperparathyroidism of renal origin: Secondary | ICD-10-CM | POA: Diagnosis not present

## 2021-09-26 DIAGNOSIS — N186 End stage renal disease: Secondary | ICD-10-CM | POA: Diagnosis not present

## 2021-09-26 DIAGNOSIS — D631 Anemia in chronic kidney disease: Secondary | ICD-10-CM | POA: Diagnosis not present

## 2021-09-26 DIAGNOSIS — N2581 Secondary hyperparathyroidism of renal origin: Secondary | ICD-10-CM | POA: Diagnosis not present

## 2021-09-27 DIAGNOSIS — N186 End stage renal disease: Secondary | ICD-10-CM | POA: Diagnosis not present

## 2021-09-27 DIAGNOSIS — D631 Anemia in chronic kidney disease: Secondary | ICD-10-CM | POA: Diagnosis not present

## 2021-09-27 DIAGNOSIS — N2581 Secondary hyperparathyroidism of renal origin: Secondary | ICD-10-CM | POA: Diagnosis not present

## 2021-09-28 DIAGNOSIS — N2581 Secondary hyperparathyroidism of renal origin: Secondary | ICD-10-CM | POA: Diagnosis not present

## 2021-09-28 DIAGNOSIS — D631 Anemia in chronic kidney disease: Secondary | ICD-10-CM | POA: Diagnosis not present

## 2021-09-28 DIAGNOSIS — N186 End stage renal disease: Secondary | ICD-10-CM | POA: Diagnosis not present

## 2021-09-29 DIAGNOSIS — D631 Anemia in chronic kidney disease: Secondary | ICD-10-CM | POA: Diagnosis not present

## 2021-09-29 DIAGNOSIS — N186 End stage renal disease: Secondary | ICD-10-CM | POA: Diagnosis not present

## 2021-09-29 DIAGNOSIS — N2581 Secondary hyperparathyroidism of renal origin: Secondary | ICD-10-CM | POA: Diagnosis not present

## 2021-09-30 DIAGNOSIS — N2581 Secondary hyperparathyroidism of renal origin: Secondary | ICD-10-CM | POA: Diagnosis not present

## 2021-09-30 DIAGNOSIS — D631 Anemia in chronic kidney disease: Secondary | ICD-10-CM | POA: Diagnosis not present

## 2021-09-30 DIAGNOSIS — N186 End stage renal disease: Secondary | ICD-10-CM | POA: Diagnosis not present

## 2021-10-01 DIAGNOSIS — D631 Anemia in chronic kidney disease: Secondary | ICD-10-CM | POA: Diagnosis not present

## 2021-10-01 DIAGNOSIS — N186 End stage renal disease: Secondary | ICD-10-CM | POA: Diagnosis not present

## 2021-10-01 DIAGNOSIS — N2581 Secondary hyperparathyroidism of renal origin: Secondary | ICD-10-CM | POA: Diagnosis not present

## 2021-10-02 DIAGNOSIS — D631 Anemia in chronic kidney disease: Secondary | ICD-10-CM | POA: Diagnosis not present

## 2021-10-02 DIAGNOSIS — N186 End stage renal disease: Secondary | ICD-10-CM | POA: Diagnosis not present

## 2021-10-02 DIAGNOSIS — N2581 Secondary hyperparathyroidism of renal origin: Secondary | ICD-10-CM | POA: Diagnosis not present

## 2021-10-03 DIAGNOSIS — D631 Anemia in chronic kidney disease: Secondary | ICD-10-CM | POA: Diagnosis not present

## 2021-10-03 DIAGNOSIS — N2581 Secondary hyperparathyroidism of renal origin: Secondary | ICD-10-CM | POA: Diagnosis not present

## 2021-10-03 DIAGNOSIS — N186 End stage renal disease: Secondary | ICD-10-CM | POA: Diagnosis not present

## 2021-10-04 DIAGNOSIS — N2581 Secondary hyperparathyroidism of renal origin: Secondary | ICD-10-CM | POA: Diagnosis not present

## 2021-10-04 DIAGNOSIS — D631 Anemia in chronic kidney disease: Secondary | ICD-10-CM | POA: Diagnosis not present

## 2021-10-04 DIAGNOSIS — N186 End stage renal disease: Secondary | ICD-10-CM | POA: Diagnosis not present

## 2021-10-05 DIAGNOSIS — N2581 Secondary hyperparathyroidism of renal origin: Secondary | ICD-10-CM | POA: Diagnosis not present

## 2021-10-05 DIAGNOSIS — N186 End stage renal disease: Secondary | ICD-10-CM | POA: Diagnosis not present

## 2021-10-05 DIAGNOSIS — D631 Anemia in chronic kidney disease: Secondary | ICD-10-CM | POA: Diagnosis not present

## 2021-10-06 DIAGNOSIS — N2581 Secondary hyperparathyroidism of renal origin: Secondary | ICD-10-CM | POA: Diagnosis not present

## 2021-10-06 DIAGNOSIS — R188 Other ascites: Secondary | ICD-10-CM | POA: Diagnosis not present

## 2021-10-06 DIAGNOSIS — D631 Anemia in chronic kidney disease: Secondary | ICD-10-CM | POA: Diagnosis not present

## 2021-10-06 DIAGNOSIS — N186 End stage renal disease: Secondary | ICD-10-CM | POA: Diagnosis not present

## 2021-10-06 DIAGNOSIS — K7460 Unspecified cirrhosis of liver: Secondary | ICD-10-CM | POA: Diagnosis not present

## 2021-10-06 DIAGNOSIS — K766 Portal hypertension: Secondary | ICD-10-CM | POA: Diagnosis not present

## 2021-10-07 DIAGNOSIS — N186 End stage renal disease: Secondary | ICD-10-CM | POA: Diagnosis not present

## 2021-10-07 DIAGNOSIS — D631 Anemia in chronic kidney disease: Secondary | ICD-10-CM | POA: Diagnosis not present

## 2021-10-07 DIAGNOSIS — N2581 Secondary hyperparathyroidism of renal origin: Secondary | ICD-10-CM | POA: Diagnosis not present

## 2021-10-08 DIAGNOSIS — D631 Anemia in chronic kidney disease: Secondary | ICD-10-CM | POA: Diagnosis not present

## 2021-10-08 DIAGNOSIS — N186 End stage renal disease: Secondary | ICD-10-CM | POA: Diagnosis not present

## 2021-10-08 DIAGNOSIS — N2581 Secondary hyperparathyroidism of renal origin: Secondary | ICD-10-CM | POA: Diagnosis not present

## 2021-10-09 DIAGNOSIS — N186 End stage renal disease: Secondary | ICD-10-CM | POA: Diagnosis not present

## 2021-10-09 DIAGNOSIS — D631 Anemia in chronic kidney disease: Secondary | ICD-10-CM | POA: Diagnosis not present

## 2021-10-09 DIAGNOSIS — N2581 Secondary hyperparathyroidism of renal origin: Secondary | ICD-10-CM | POA: Diagnosis not present

## 2021-10-10 DIAGNOSIS — N2581 Secondary hyperparathyroidism of renal origin: Secondary | ICD-10-CM | POA: Diagnosis not present

## 2021-10-10 DIAGNOSIS — D631 Anemia in chronic kidney disease: Secondary | ICD-10-CM | POA: Diagnosis not present

## 2021-10-10 DIAGNOSIS — N186 End stage renal disease: Secondary | ICD-10-CM | POA: Diagnosis not present

## 2021-10-11 DIAGNOSIS — D631 Anemia in chronic kidney disease: Secondary | ICD-10-CM | POA: Diagnosis not present

## 2021-10-11 DIAGNOSIS — N2581 Secondary hyperparathyroidism of renal origin: Secondary | ICD-10-CM | POA: Diagnosis not present

## 2021-10-11 DIAGNOSIS — N186 End stage renal disease: Secondary | ICD-10-CM | POA: Diagnosis not present

## 2021-10-12 DIAGNOSIS — N186 End stage renal disease: Secondary | ICD-10-CM | POA: Diagnosis not present

## 2021-10-12 DIAGNOSIS — D631 Anemia in chronic kidney disease: Secondary | ICD-10-CM | POA: Diagnosis not present

## 2021-10-12 DIAGNOSIS — N2581 Secondary hyperparathyroidism of renal origin: Secondary | ICD-10-CM | POA: Diagnosis not present

## 2021-10-13 DIAGNOSIS — N186 End stage renal disease: Secondary | ICD-10-CM | POA: Diagnosis not present

## 2021-10-13 DIAGNOSIS — N2581 Secondary hyperparathyroidism of renal origin: Secondary | ICD-10-CM | POA: Diagnosis not present

## 2021-10-13 DIAGNOSIS — D631 Anemia in chronic kidney disease: Secondary | ICD-10-CM | POA: Diagnosis not present

## 2021-10-14 DIAGNOSIS — N186 End stage renal disease: Secondary | ICD-10-CM | POA: Diagnosis not present

## 2021-10-14 DIAGNOSIS — N2581 Secondary hyperparathyroidism of renal origin: Secondary | ICD-10-CM | POA: Diagnosis not present

## 2021-10-14 DIAGNOSIS — D631 Anemia in chronic kidney disease: Secondary | ICD-10-CM | POA: Diagnosis not present

## 2021-10-15 DIAGNOSIS — N186 End stage renal disease: Secondary | ICD-10-CM | POA: Diagnosis not present

## 2021-10-15 DIAGNOSIS — N2581 Secondary hyperparathyroidism of renal origin: Secondary | ICD-10-CM | POA: Diagnosis not present

## 2021-10-15 DIAGNOSIS — D631 Anemia in chronic kidney disease: Secondary | ICD-10-CM | POA: Diagnosis not present

## 2021-10-16 DIAGNOSIS — N186 End stage renal disease: Secondary | ICD-10-CM | POA: Diagnosis not present

## 2021-10-16 DIAGNOSIS — D631 Anemia in chronic kidney disease: Secondary | ICD-10-CM | POA: Diagnosis not present

## 2021-10-16 DIAGNOSIS — N2581 Secondary hyperparathyroidism of renal origin: Secondary | ICD-10-CM | POA: Diagnosis not present

## 2021-10-17 DIAGNOSIS — N2581 Secondary hyperparathyroidism of renal origin: Secondary | ICD-10-CM | POA: Diagnosis not present

## 2021-10-17 DIAGNOSIS — D631 Anemia in chronic kidney disease: Secondary | ICD-10-CM | POA: Diagnosis not present

## 2021-10-17 DIAGNOSIS — N186 End stage renal disease: Secondary | ICD-10-CM | POA: Diagnosis not present

## 2021-10-18 DIAGNOSIS — N2581 Secondary hyperparathyroidism of renal origin: Secondary | ICD-10-CM | POA: Diagnosis not present

## 2021-10-18 DIAGNOSIS — D631 Anemia in chronic kidney disease: Secondary | ICD-10-CM | POA: Diagnosis not present

## 2021-10-18 DIAGNOSIS — N186 End stage renal disease: Secondary | ICD-10-CM | POA: Diagnosis not present

## 2021-10-19 DIAGNOSIS — N2581 Secondary hyperparathyroidism of renal origin: Secondary | ICD-10-CM | POA: Diagnosis not present

## 2021-10-19 DIAGNOSIS — D631 Anemia in chronic kidney disease: Secondary | ICD-10-CM | POA: Diagnosis not present

## 2021-10-19 DIAGNOSIS — N186 End stage renal disease: Secondary | ICD-10-CM | POA: Diagnosis not present

## 2021-10-20 DIAGNOSIS — N2581 Secondary hyperparathyroidism of renal origin: Secondary | ICD-10-CM | POA: Diagnosis not present

## 2021-10-20 DIAGNOSIS — N186 End stage renal disease: Secondary | ICD-10-CM | POA: Diagnosis not present

## 2021-10-20 DIAGNOSIS — D631 Anemia in chronic kidney disease: Secondary | ICD-10-CM | POA: Diagnosis not present

## 2021-10-21 DIAGNOSIS — D631 Anemia in chronic kidney disease: Secondary | ICD-10-CM | POA: Diagnosis not present

## 2021-10-21 DIAGNOSIS — N2581 Secondary hyperparathyroidism of renal origin: Secondary | ICD-10-CM | POA: Diagnosis not present

## 2021-10-21 DIAGNOSIS — N186 End stage renal disease: Secondary | ICD-10-CM | POA: Diagnosis not present

## 2021-10-22 DIAGNOSIS — N186 End stage renal disease: Secondary | ICD-10-CM | POA: Diagnosis not present

## 2021-10-22 DIAGNOSIS — D631 Anemia in chronic kidney disease: Secondary | ICD-10-CM | POA: Diagnosis not present

## 2021-10-22 DIAGNOSIS — N2581 Secondary hyperparathyroidism of renal origin: Secondary | ICD-10-CM | POA: Diagnosis not present

## 2021-10-23 DIAGNOSIS — Z992 Dependence on renal dialysis: Secondary | ICD-10-CM | POA: Diagnosis not present

## 2021-10-23 DIAGNOSIS — D631 Anemia in chronic kidney disease: Secondary | ICD-10-CM | POA: Diagnosis not present

## 2021-10-23 DIAGNOSIS — N186 End stage renal disease: Secondary | ICD-10-CM | POA: Diagnosis not present

## 2021-10-23 DIAGNOSIS — N2581 Secondary hyperparathyroidism of renal origin: Secondary | ICD-10-CM | POA: Diagnosis not present

## 2021-10-24 DIAGNOSIS — N2581 Secondary hyperparathyroidism of renal origin: Secondary | ICD-10-CM | POA: Diagnosis not present

## 2021-10-24 DIAGNOSIS — N186 End stage renal disease: Secondary | ICD-10-CM | POA: Diagnosis not present

## 2021-10-24 DIAGNOSIS — D631 Anemia in chronic kidney disease: Secondary | ICD-10-CM | POA: Diagnosis not present

## 2021-10-25 DIAGNOSIS — N2581 Secondary hyperparathyroidism of renal origin: Secondary | ICD-10-CM | POA: Diagnosis not present

## 2021-10-25 DIAGNOSIS — N186 End stage renal disease: Secondary | ICD-10-CM | POA: Diagnosis not present

## 2021-10-25 DIAGNOSIS — D631 Anemia in chronic kidney disease: Secondary | ICD-10-CM | POA: Diagnosis not present

## 2021-10-26 DIAGNOSIS — N186 End stage renal disease: Secondary | ICD-10-CM | POA: Diagnosis not present

## 2021-10-26 DIAGNOSIS — D631 Anemia in chronic kidney disease: Secondary | ICD-10-CM | POA: Diagnosis not present

## 2021-10-26 DIAGNOSIS — N2581 Secondary hyperparathyroidism of renal origin: Secondary | ICD-10-CM | POA: Diagnosis not present

## 2021-10-27 DIAGNOSIS — N186 End stage renal disease: Secondary | ICD-10-CM | POA: Diagnosis not present

## 2021-10-27 DIAGNOSIS — E119 Type 2 diabetes mellitus without complications: Secondary | ICD-10-CM | POA: Diagnosis not present

## 2021-10-27 DIAGNOSIS — D631 Anemia in chronic kidney disease: Secondary | ICD-10-CM | POA: Diagnosis not present

## 2021-10-27 DIAGNOSIS — N2581 Secondary hyperparathyroidism of renal origin: Secondary | ICD-10-CM | POA: Diagnosis not present

## 2021-10-28 DIAGNOSIS — N186 End stage renal disease: Secondary | ICD-10-CM | POA: Diagnosis not present

## 2021-10-28 DIAGNOSIS — N2581 Secondary hyperparathyroidism of renal origin: Secondary | ICD-10-CM | POA: Diagnosis not present

## 2021-10-28 DIAGNOSIS — D631 Anemia in chronic kidney disease: Secondary | ICD-10-CM | POA: Diagnosis not present

## 2021-10-29 DIAGNOSIS — N186 End stage renal disease: Secondary | ICD-10-CM | POA: Diagnosis not present

## 2021-10-29 DIAGNOSIS — N2581 Secondary hyperparathyroidism of renal origin: Secondary | ICD-10-CM | POA: Diagnosis not present

## 2021-10-29 DIAGNOSIS — D631 Anemia in chronic kidney disease: Secondary | ICD-10-CM | POA: Diagnosis not present

## 2021-10-30 DIAGNOSIS — N2581 Secondary hyperparathyroidism of renal origin: Secondary | ICD-10-CM | POA: Diagnosis not present

## 2021-10-30 DIAGNOSIS — D631 Anemia in chronic kidney disease: Secondary | ICD-10-CM | POA: Diagnosis not present

## 2021-10-30 DIAGNOSIS — N186 End stage renal disease: Secondary | ICD-10-CM | POA: Diagnosis not present

## 2021-10-31 DIAGNOSIS — N186 End stage renal disease: Secondary | ICD-10-CM | POA: Diagnosis not present

## 2021-10-31 DIAGNOSIS — N2581 Secondary hyperparathyroidism of renal origin: Secondary | ICD-10-CM | POA: Diagnosis not present

## 2021-10-31 DIAGNOSIS — D631 Anemia in chronic kidney disease: Secondary | ICD-10-CM | POA: Diagnosis not present

## 2021-11-01 DIAGNOSIS — N2581 Secondary hyperparathyroidism of renal origin: Secondary | ICD-10-CM | POA: Diagnosis not present

## 2021-11-01 DIAGNOSIS — N186 End stage renal disease: Secondary | ICD-10-CM | POA: Diagnosis not present

## 2021-11-01 DIAGNOSIS — D631 Anemia in chronic kidney disease: Secondary | ICD-10-CM | POA: Diagnosis not present

## 2021-11-02 DIAGNOSIS — N2581 Secondary hyperparathyroidism of renal origin: Secondary | ICD-10-CM | POA: Diagnosis not present

## 2021-11-02 DIAGNOSIS — D631 Anemia in chronic kidney disease: Secondary | ICD-10-CM | POA: Diagnosis not present

## 2021-11-02 DIAGNOSIS — N186 End stage renal disease: Secondary | ICD-10-CM | POA: Diagnosis not present

## 2021-11-03 DIAGNOSIS — D631 Anemia in chronic kidney disease: Secondary | ICD-10-CM | POA: Diagnosis not present

## 2021-11-03 DIAGNOSIS — N186 End stage renal disease: Secondary | ICD-10-CM | POA: Diagnosis not present

## 2021-11-03 DIAGNOSIS — N2581 Secondary hyperparathyroidism of renal origin: Secondary | ICD-10-CM | POA: Diagnosis not present

## 2021-11-04 DIAGNOSIS — D631 Anemia in chronic kidney disease: Secondary | ICD-10-CM | POA: Diagnosis not present

## 2021-11-04 DIAGNOSIS — N186 End stage renal disease: Secondary | ICD-10-CM | POA: Diagnosis not present

## 2021-11-04 DIAGNOSIS — N2581 Secondary hyperparathyroidism of renal origin: Secondary | ICD-10-CM | POA: Diagnosis not present

## 2021-11-05 DIAGNOSIS — D631 Anemia in chronic kidney disease: Secondary | ICD-10-CM | POA: Diagnosis not present

## 2021-11-05 DIAGNOSIS — N186 End stage renal disease: Secondary | ICD-10-CM | POA: Diagnosis not present

## 2021-11-05 DIAGNOSIS — N2581 Secondary hyperparathyroidism of renal origin: Secondary | ICD-10-CM | POA: Diagnosis not present

## 2021-11-06 DIAGNOSIS — N186 End stage renal disease: Secondary | ICD-10-CM | POA: Diagnosis not present

## 2021-11-06 DIAGNOSIS — D631 Anemia in chronic kidney disease: Secondary | ICD-10-CM | POA: Diagnosis not present

## 2021-11-06 DIAGNOSIS — N2581 Secondary hyperparathyroidism of renal origin: Secondary | ICD-10-CM | POA: Diagnosis not present

## 2021-11-07 DIAGNOSIS — D631 Anemia in chronic kidney disease: Secondary | ICD-10-CM | POA: Diagnosis not present

## 2021-11-07 DIAGNOSIS — N2581 Secondary hyperparathyroidism of renal origin: Secondary | ICD-10-CM | POA: Diagnosis not present

## 2021-11-07 DIAGNOSIS — N186 End stage renal disease: Secondary | ICD-10-CM | POA: Diagnosis not present

## 2021-11-08 DIAGNOSIS — D631 Anemia in chronic kidney disease: Secondary | ICD-10-CM | POA: Diagnosis not present

## 2021-11-08 DIAGNOSIS — N186 End stage renal disease: Secondary | ICD-10-CM | POA: Diagnosis not present

## 2021-11-08 DIAGNOSIS — N2581 Secondary hyperparathyroidism of renal origin: Secondary | ICD-10-CM | POA: Diagnosis not present

## 2021-11-09 DIAGNOSIS — N186 End stage renal disease: Secondary | ICD-10-CM | POA: Diagnosis not present

## 2021-11-09 DIAGNOSIS — D631 Anemia in chronic kidney disease: Secondary | ICD-10-CM | POA: Diagnosis not present

## 2021-11-09 DIAGNOSIS — N2581 Secondary hyperparathyroidism of renal origin: Secondary | ICD-10-CM | POA: Diagnosis not present

## 2021-11-10 DIAGNOSIS — N186 End stage renal disease: Secondary | ICD-10-CM | POA: Diagnosis not present

## 2021-11-10 DIAGNOSIS — N2581 Secondary hyperparathyroidism of renal origin: Secondary | ICD-10-CM | POA: Diagnosis not present

## 2021-11-10 DIAGNOSIS — D631 Anemia in chronic kidney disease: Secondary | ICD-10-CM | POA: Diagnosis not present

## 2021-11-11 DIAGNOSIS — N186 End stage renal disease: Secondary | ICD-10-CM | POA: Diagnosis not present

## 2021-11-11 DIAGNOSIS — N2581 Secondary hyperparathyroidism of renal origin: Secondary | ICD-10-CM | POA: Diagnosis not present

## 2021-11-11 DIAGNOSIS — D631 Anemia in chronic kidney disease: Secondary | ICD-10-CM | POA: Diagnosis not present

## 2021-11-12 DIAGNOSIS — N186 End stage renal disease: Secondary | ICD-10-CM | POA: Diagnosis not present

## 2021-11-12 DIAGNOSIS — N2581 Secondary hyperparathyroidism of renal origin: Secondary | ICD-10-CM | POA: Diagnosis not present

## 2021-11-12 DIAGNOSIS — D631 Anemia in chronic kidney disease: Secondary | ICD-10-CM | POA: Diagnosis not present

## 2021-11-13 DIAGNOSIS — D631 Anemia in chronic kidney disease: Secondary | ICD-10-CM | POA: Diagnosis not present

## 2021-11-13 DIAGNOSIS — N2581 Secondary hyperparathyroidism of renal origin: Secondary | ICD-10-CM | POA: Diagnosis not present

## 2021-11-13 DIAGNOSIS — N186 End stage renal disease: Secondary | ICD-10-CM | POA: Diagnosis not present

## 2021-11-14 DIAGNOSIS — D631 Anemia in chronic kidney disease: Secondary | ICD-10-CM | POA: Diagnosis not present

## 2021-11-14 DIAGNOSIS — N2581 Secondary hyperparathyroidism of renal origin: Secondary | ICD-10-CM | POA: Diagnosis not present

## 2021-11-14 DIAGNOSIS — N186 End stage renal disease: Secondary | ICD-10-CM | POA: Diagnosis not present

## 2021-11-15 DIAGNOSIS — N2581 Secondary hyperparathyroidism of renal origin: Secondary | ICD-10-CM | POA: Diagnosis not present

## 2021-11-15 DIAGNOSIS — D631 Anemia in chronic kidney disease: Secondary | ICD-10-CM | POA: Diagnosis not present

## 2021-11-15 DIAGNOSIS — N186 End stage renal disease: Secondary | ICD-10-CM | POA: Diagnosis not present

## 2021-11-16 DIAGNOSIS — N186 End stage renal disease: Secondary | ICD-10-CM | POA: Diagnosis not present

## 2021-11-16 DIAGNOSIS — I10 Essential (primary) hypertension: Secondary | ICD-10-CM | POA: Diagnosis not present

## 2021-11-16 DIAGNOSIS — D631 Anemia in chronic kidney disease: Secondary | ICD-10-CM | POA: Diagnosis not present

## 2021-11-16 DIAGNOSIS — N2581 Secondary hyperparathyroidism of renal origin: Secondary | ICD-10-CM | POA: Diagnosis not present

## 2021-11-16 DIAGNOSIS — E559 Vitamin D deficiency, unspecified: Secondary | ICD-10-CM | POA: Diagnosis not present

## 2021-11-16 DIAGNOSIS — E1165 Type 2 diabetes mellitus with hyperglycemia: Secondary | ICD-10-CM | POA: Diagnosis not present

## 2021-11-16 DIAGNOSIS — E785 Hyperlipidemia, unspecified: Secondary | ICD-10-CM | POA: Diagnosis not present

## 2021-11-17 DIAGNOSIS — D631 Anemia in chronic kidney disease: Secondary | ICD-10-CM | POA: Diagnosis not present

## 2021-11-17 DIAGNOSIS — N2581 Secondary hyperparathyroidism of renal origin: Secondary | ICD-10-CM | POA: Diagnosis not present

## 2021-11-17 DIAGNOSIS — N186 End stage renal disease: Secondary | ICD-10-CM | POA: Diagnosis not present

## 2021-11-18 DIAGNOSIS — N186 End stage renal disease: Secondary | ICD-10-CM | POA: Diagnosis not present

## 2021-11-18 DIAGNOSIS — D631 Anemia in chronic kidney disease: Secondary | ICD-10-CM | POA: Diagnosis not present

## 2021-11-18 DIAGNOSIS — N2581 Secondary hyperparathyroidism of renal origin: Secondary | ICD-10-CM | POA: Diagnosis not present

## 2021-11-19 DIAGNOSIS — N186 End stage renal disease: Secondary | ICD-10-CM | POA: Diagnosis not present

## 2021-11-19 DIAGNOSIS — N2581 Secondary hyperparathyroidism of renal origin: Secondary | ICD-10-CM | POA: Diagnosis not present

## 2021-11-19 DIAGNOSIS — D631 Anemia in chronic kidney disease: Secondary | ICD-10-CM | POA: Diagnosis not present

## 2021-11-20 DIAGNOSIS — N186 End stage renal disease: Secondary | ICD-10-CM | POA: Diagnosis not present

## 2021-11-20 DIAGNOSIS — N2581 Secondary hyperparathyroidism of renal origin: Secondary | ICD-10-CM | POA: Diagnosis not present

## 2021-11-20 DIAGNOSIS — D631 Anemia in chronic kidney disease: Secondary | ICD-10-CM | POA: Diagnosis not present

## 2021-11-21 DIAGNOSIS — N2581 Secondary hyperparathyroidism of renal origin: Secondary | ICD-10-CM | POA: Diagnosis not present

## 2021-11-21 DIAGNOSIS — N186 End stage renal disease: Secondary | ICD-10-CM | POA: Diagnosis not present

## 2021-11-21 DIAGNOSIS — D631 Anemia in chronic kidney disease: Secondary | ICD-10-CM | POA: Diagnosis not present

## 2021-11-22 DIAGNOSIS — N186 End stage renal disease: Secondary | ICD-10-CM | POA: Diagnosis not present

## 2021-11-22 DIAGNOSIS — D631 Anemia in chronic kidney disease: Secondary | ICD-10-CM | POA: Diagnosis not present

## 2021-11-22 DIAGNOSIS — N2581 Secondary hyperparathyroidism of renal origin: Secondary | ICD-10-CM | POA: Diagnosis not present

## 2021-11-23 DIAGNOSIS — K769 Liver disease, unspecified: Secondary | ICD-10-CM | POA: Diagnosis not present

## 2021-11-23 DIAGNOSIS — Z6832 Body mass index (BMI) 32.0-32.9, adult: Secondary | ICD-10-CM | POA: Diagnosis not present

## 2021-11-23 DIAGNOSIS — Z992 Dependence on renal dialysis: Secondary | ICD-10-CM | POA: Diagnosis not present

## 2021-11-23 DIAGNOSIS — R188 Other ascites: Secondary | ICD-10-CM | POA: Diagnosis not present

## 2021-11-23 DIAGNOSIS — N186 End stage renal disease: Secondary | ICD-10-CM | POA: Diagnosis not present

## 2021-11-23 DIAGNOSIS — N2581 Secondary hyperparathyroidism of renal origin: Secondary | ICD-10-CM | POA: Diagnosis not present

## 2021-11-23 DIAGNOSIS — K7469 Other cirrhosis of liver: Secondary | ICD-10-CM | POA: Diagnosis not present

## 2021-11-23 DIAGNOSIS — D631 Anemia in chronic kidney disease: Secondary | ICD-10-CM | POA: Diagnosis not present

## 2021-11-24 DIAGNOSIS — Z880 Allergy status to penicillin: Secondary | ICD-10-CM | POA: Diagnosis not present

## 2021-11-24 DIAGNOSIS — N2581 Secondary hyperparathyroidism of renal origin: Secondary | ICD-10-CM | POA: Diagnosis not present

## 2021-11-24 DIAGNOSIS — Z01818 Encounter for other preprocedural examination: Secondary | ICD-10-CM | POA: Diagnosis not present

## 2021-11-24 DIAGNOSIS — D631 Anemia in chronic kidney disease: Secondary | ICD-10-CM | POA: Diagnosis not present

## 2021-11-24 DIAGNOSIS — K769 Liver disease, unspecified: Secondary | ICD-10-CM | POA: Insufficient documentation

## 2021-11-24 DIAGNOSIS — R188 Other ascites: Secondary | ICD-10-CM | POA: Insufficient documentation

## 2021-11-24 DIAGNOSIS — Z992 Dependence on renal dialysis: Secondary | ICD-10-CM | POA: Diagnosis not present

## 2021-11-24 DIAGNOSIS — N186 End stage renal disease: Secondary | ICD-10-CM | POA: Diagnosis not present

## 2021-11-25 DIAGNOSIS — N186 End stage renal disease: Secondary | ICD-10-CM | POA: Diagnosis not present

## 2021-11-25 DIAGNOSIS — N2581 Secondary hyperparathyroidism of renal origin: Secondary | ICD-10-CM | POA: Diagnosis not present

## 2021-11-25 DIAGNOSIS — D631 Anemia in chronic kidney disease: Secondary | ICD-10-CM | POA: Diagnosis not present

## 2021-11-26 DIAGNOSIS — D631 Anemia in chronic kidney disease: Secondary | ICD-10-CM | POA: Diagnosis not present

## 2021-11-26 DIAGNOSIS — N2581 Secondary hyperparathyroidism of renal origin: Secondary | ICD-10-CM | POA: Diagnosis not present

## 2021-11-26 DIAGNOSIS — N186 End stage renal disease: Secondary | ICD-10-CM | POA: Diagnosis not present

## 2021-11-27 DIAGNOSIS — E119 Type 2 diabetes mellitus without complications: Secondary | ICD-10-CM | POA: Diagnosis not present

## 2021-11-27 DIAGNOSIS — N2581 Secondary hyperparathyroidism of renal origin: Secondary | ICD-10-CM | POA: Diagnosis not present

## 2021-11-27 DIAGNOSIS — N186 End stage renal disease: Secondary | ICD-10-CM | POA: Diagnosis not present

## 2021-11-27 DIAGNOSIS — D631 Anemia in chronic kidney disease: Secondary | ICD-10-CM | POA: Diagnosis not present

## 2021-11-28 DIAGNOSIS — N2581 Secondary hyperparathyroidism of renal origin: Secondary | ICD-10-CM | POA: Diagnosis not present

## 2021-11-28 DIAGNOSIS — D631 Anemia in chronic kidney disease: Secondary | ICD-10-CM | POA: Diagnosis not present

## 2021-11-28 DIAGNOSIS — N186 End stage renal disease: Secondary | ICD-10-CM | POA: Diagnosis not present

## 2021-11-29 DIAGNOSIS — N186 End stage renal disease: Secondary | ICD-10-CM | POA: Diagnosis not present

## 2021-11-29 DIAGNOSIS — D631 Anemia in chronic kidney disease: Secondary | ICD-10-CM | POA: Diagnosis not present

## 2021-11-29 DIAGNOSIS — N2581 Secondary hyperparathyroidism of renal origin: Secondary | ICD-10-CM | POA: Diagnosis not present

## 2021-11-30 DIAGNOSIS — N2581 Secondary hyperparathyroidism of renal origin: Secondary | ICD-10-CM | POA: Diagnosis not present

## 2021-11-30 DIAGNOSIS — D631 Anemia in chronic kidney disease: Secondary | ICD-10-CM | POA: Diagnosis not present

## 2021-11-30 DIAGNOSIS — N186 End stage renal disease: Secondary | ICD-10-CM | POA: Diagnosis not present

## 2021-12-01 DIAGNOSIS — N2581 Secondary hyperparathyroidism of renal origin: Secondary | ICD-10-CM | POA: Diagnosis not present

## 2021-12-01 DIAGNOSIS — D631 Anemia in chronic kidney disease: Secondary | ICD-10-CM | POA: Diagnosis not present

## 2021-12-01 DIAGNOSIS — N186 End stage renal disease: Secondary | ICD-10-CM | POA: Diagnosis not present

## 2021-12-02 DIAGNOSIS — N186 End stage renal disease: Secondary | ICD-10-CM | POA: Diagnosis not present

## 2021-12-02 DIAGNOSIS — N2581 Secondary hyperparathyroidism of renal origin: Secondary | ICD-10-CM | POA: Diagnosis not present

## 2021-12-02 DIAGNOSIS — D631 Anemia in chronic kidney disease: Secondary | ICD-10-CM | POA: Diagnosis not present

## 2021-12-03 DIAGNOSIS — N2581 Secondary hyperparathyroidism of renal origin: Secondary | ICD-10-CM | POA: Diagnosis not present

## 2021-12-03 DIAGNOSIS — N186 End stage renal disease: Secondary | ICD-10-CM | POA: Diagnosis not present

## 2021-12-03 DIAGNOSIS — D631 Anemia in chronic kidney disease: Secondary | ICD-10-CM | POA: Diagnosis not present

## 2021-12-04 DIAGNOSIS — N186 End stage renal disease: Secondary | ICD-10-CM | POA: Diagnosis not present

## 2021-12-04 DIAGNOSIS — N2581 Secondary hyperparathyroidism of renal origin: Secondary | ICD-10-CM | POA: Diagnosis not present

## 2021-12-04 DIAGNOSIS — D631 Anemia in chronic kidney disease: Secondary | ICD-10-CM | POA: Diagnosis not present

## 2021-12-05 DIAGNOSIS — N186 End stage renal disease: Secondary | ICD-10-CM | POA: Diagnosis not present

## 2021-12-05 DIAGNOSIS — N2581 Secondary hyperparathyroidism of renal origin: Secondary | ICD-10-CM | POA: Diagnosis not present

## 2021-12-05 DIAGNOSIS — D631 Anemia in chronic kidney disease: Secondary | ICD-10-CM | POA: Diagnosis not present

## 2021-12-06 DIAGNOSIS — D631 Anemia in chronic kidney disease: Secondary | ICD-10-CM | POA: Diagnosis not present

## 2021-12-06 DIAGNOSIS — N2581 Secondary hyperparathyroidism of renal origin: Secondary | ICD-10-CM | POA: Diagnosis not present

## 2021-12-06 DIAGNOSIS — N186 End stage renal disease: Secondary | ICD-10-CM | POA: Diagnosis not present

## 2021-12-07 DIAGNOSIS — N2581 Secondary hyperparathyroidism of renal origin: Secondary | ICD-10-CM | POA: Diagnosis not present

## 2021-12-07 DIAGNOSIS — N186 End stage renal disease: Secondary | ICD-10-CM | POA: Diagnosis not present

## 2021-12-07 DIAGNOSIS — D631 Anemia in chronic kidney disease: Secondary | ICD-10-CM | POA: Diagnosis not present

## 2021-12-08 DIAGNOSIS — N2581 Secondary hyperparathyroidism of renal origin: Secondary | ICD-10-CM | POA: Diagnosis not present

## 2021-12-08 DIAGNOSIS — D631 Anemia in chronic kidney disease: Secondary | ICD-10-CM | POA: Diagnosis not present

## 2021-12-08 DIAGNOSIS — N186 End stage renal disease: Secondary | ICD-10-CM | POA: Diagnosis not present

## 2021-12-09 DIAGNOSIS — N186 End stage renal disease: Secondary | ICD-10-CM | POA: Diagnosis not present

## 2021-12-09 DIAGNOSIS — N2581 Secondary hyperparathyroidism of renal origin: Secondary | ICD-10-CM | POA: Diagnosis not present

## 2021-12-09 DIAGNOSIS — D631 Anemia in chronic kidney disease: Secondary | ICD-10-CM | POA: Diagnosis not present

## 2021-12-10 DIAGNOSIS — D631 Anemia in chronic kidney disease: Secondary | ICD-10-CM | POA: Diagnosis not present

## 2021-12-10 DIAGNOSIS — N186 End stage renal disease: Secondary | ICD-10-CM | POA: Diagnosis not present

## 2021-12-10 DIAGNOSIS — N2581 Secondary hyperparathyroidism of renal origin: Secondary | ICD-10-CM | POA: Diagnosis not present

## 2021-12-11 DIAGNOSIS — Z880 Allergy status to penicillin: Secondary | ICD-10-CM | POA: Diagnosis not present

## 2021-12-11 DIAGNOSIS — E859 Amyloidosis, unspecified: Secondary | ICD-10-CM | POA: Diagnosis not present

## 2021-12-11 DIAGNOSIS — K77 Liver disorders in diseases classified elsewhere: Secondary | ICD-10-CM | POA: Diagnosis not present

## 2021-12-11 DIAGNOSIS — Z7682 Awaiting organ transplant status: Secondary | ICD-10-CM | POA: Diagnosis not present

## 2021-12-11 DIAGNOSIS — N186 End stage renal disease: Secondary | ICD-10-CM | POA: Diagnosis not present

## 2021-12-11 DIAGNOSIS — E854 Organ-limited amyloidosis: Secondary | ICD-10-CM | POA: Diagnosis not present

## 2021-12-11 DIAGNOSIS — Z79899 Other long term (current) drug therapy: Secondary | ICD-10-CM | POA: Diagnosis not present

## 2021-12-11 DIAGNOSIS — Z992 Dependence on renal dialysis: Secondary | ICD-10-CM | POA: Diagnosis not present

## 2021-12-11 DIAGNOSIS — K7469 Other cirrhosis of liver: Secondary | ICD-10-CM | POA: Diagnosis not present

## 2021-12-11 DIAGNOSIS — E7800 Pure hypercholesterolemia, unspecified: Secondary | ICD-10-CM | POA: Diagnosis not present

## 2021-12-11 DIAGNOSIS — N2581 Secondary hyperparathyroidism of renal origin: Secondary | ICD-10-CM | POA: Diagnosis not present

## 2021-12-11 DIAGNOSIS — R188 Other ascites: Secondary | ICD-10-CM | POA: Diagnosis not present

## 2021-12-11 DIAGNOSIS — I120 Hypertensive chronic kidney disease with stage 5 chronic kidney disease or end stage renal disease: Secondary | ICD-10-CM | POA: Diagnosis not present

## 2021-12-11 DIAGNOSIS — E1122 Type 2 diabetes mellitus with diabetic chronic kidney disease: Secondary | ICD-10-CM | POA: Diagnosis not present

## 2021-12-11 DIAGNOSIS — D631 Anemia in chronic kidney disease: Secondary | ICD-10-CM | POA: Diagnosis not present

## 2021-12-12 DIAGNOSIS — D631 Anemia in chronic kidney disease: Secondary | ICD-10-CM | POA: Diagnosis not present

## 2021-12-12 DIAGNOSIS — N2581 Secondary hyperparathyroidism of renal origin: Secondary | ICD-10-CM | POA: Diagnosis not present

## 2021-12-12 DIAGNOSIS — N186 End stage renal disease: Secondary | ICD-10-CM | POA: Diagnosis not present

## 2021-12-13 DIAGNOSIS — N2581 Secondary hyperparathyroidism of renal origin: Secondary | ICD-10-CM | POA: Diagnosis not present

## 2021-12-13 DIAGNOSIS — N186 End stage renal disease: Secondary | ICD-10-CM | POA: Diagnosis not present

## 2021-12-13 DIAGNOSIS — D631 Anemia in chronic kidney disease: Secondary | ICD-10-CM | POA: Diagnosis not present

## 2021-12-14 DIAGNOSIS — N186 End stage renal disease: Secondary | ICD-10-CM | POA: Diagnosis not present

## 2021-12-14 DIAGNOSIS — D631 Anemia in chronic kidney disease: Secondary | ICD-10-CM | POA: Diagnosis not present

## 2021-12-14 DIAGNOSIS — N2581 Secondary hyperparathyroidism of renal origin: Secondary | ICD-10-CM | POA: Diagnosis not present

## 2021-12-15 DIAGNOSIS — D631 Anemia in chronic kidney disease: Secondary | ICD-10-CM | POA: Diagnosis not present

## 2021-12-15 DIAGNOSIS — N2581 Secondary hyperparathyroidism of renal origin: Secondary | ICD-10-CM | POA: Diagnosis not present

## 2021-12-15 DIAGNOSIS — N186 End stage renal disease: Secondary | ICD-10-CM | POA: Diagnosis not present

## 2021-12-16 DIAGNOSIS — N2581 Secondary hyperparathyroidism of renal origin: Secondary | ICD-10-CM | POA: Diagnosis not present

## 2021-12-16 DIAGNOSIS — D631 Anemia in chronic kidney disease: Secondary | ICD-10-CM | POA: Diagnosis not present

## 2021-12-16 DIAGNOSIS — N186 End stage renal disease: Secondary | ICD-10-CM | POA: Diagnosis not present

## 2021-12-17 DIAGNOSIS — D631 Anemia in chronic kidney disease: Secondary | ICD-10-CM | POA: Diagnosis not present

## 2021-12-17 DIAGNOSIS — N186 End stage renal disease: Secondary | ICD-10-CM | POA: Diagnosis not present

## 2021-12-17 DIAGNOSIS — N2581 Secondary hyperparathyroidism of renal origin: Secondary | ICD-10-CM | POA: Diagnosis not present

## 2021-12-18 DIAGNOSIS — N2581 Secondary hyperparathyroidism of renal origin: Secondary | ICD-10-CM | POA: Diagnosis not present

## 2021-12-18 DIAGNOSIS — D631 Anemia in chronic kidney disease: Secondary | ICD-10-CM | POA: Diagnosis not present

## 2021-12-18 DIAGNOSIS — N186 End stage renal disease: Secondary | ICD-10-CM | POA: Diagnosis not present

## 2021-12-19 DIAGNOSIS — D631 Anemia in chronic kidney disease: Secondary | ICD-10-CM | POA: Diagnosis not present

## 2021-12-19 DIAGNOSIS — N186 End stage renal disease: Secondary | ICD-10-CM | POA: Diagnosis not present

## 2021-12-19 DIAGNOSIS — N2581 Secondary hyperparathyroidism of renal origin: Secondary | ICD-10-CM | POA: Diagnosis not present

## 2021-12-20 DIAGNOSIS — N2581 Secondary hyperparathyroidism of renal origin: Secondary | ICD-10-CM | POA: Diagnosis not present

## 2021-12-20 DIAGNOSIS — N186 End stage renal disease: Secondary | ICD-10-CM | POA: Diagnosis not present

## 2021-12-20 DIAGNOSIS — D631 Anemia in chronic kidney disease: Secondary | ICD-10-CM | POA: Diagnosis not present

## 2021-12-21 DIAGNOSIS — D631 Anemia in chronic kidney disease: Secondary | ICD-10-CM | POA: Diagnosis not present

## 2021-12-21 DIAGNOSIS — N186 End stage renal disease: Secondary | ICD-10-CM | POA: Diagnosis not present

## 2021-12-21 DIAGNOSIS — N2581 Secondary hyperparathyroidism of renal origin: Secondary | ICD-10-CM | POA: Diagnosis not present

## 2021-12-22 DIAGNOSIS — N2581 Secondary hyperparathyroidism of renal origin: Secondary | ICD-10-CM | POA: Diagnosis not present

## 2021-12-22 DIAGNOSIS — N186 End stage renal disease: Secondary | ICD-10-CM | POA: Diagnosis not present

## 2021-12-22 DIAGNOSIS — D631 Anemia in chronic kidney disease: Secondary | ICD-10-CM | POA: Diagnosis not present

## 2021-12-23 DIAGNOSIS — N186 End stage renal disease: Secondary | ICD-10-CM | POA: Diagnosis not present

## 2021-12-23 DIAGNOSIS — Z992 Dependence on renal dialysis: Secondary | ICD-10-CM | POA: Diagnosis not present

## 2021-12-23 DIAGNOSIS — D631 Anemia in chronic kidney disease: Secondary | ICD-10-CM | POA: Diagnosis not present

## 2021-12-23 DIAGNOSIS — N2581 Secondary hyperparathyroidism of renal origin: Secondary | ICD-10-CM | POA: Diagnosis not present

## 2021-12-24 DIAGNOSIS — D631 Anemia in chronic kidney disease: Secondary | ICD-10-CM | POA: Diagnosis not present

## 2021-12-24 DIAGNOSIS — Z23 Encounter for immunization: Secondary | ICD-10-CM | POA: Diagnosis not present

## 2021-12-24 DIAGNOSIS — N2581 Secondary hyperparathyroidism of renal origin: Secondary | ICD-10-CM | POA: Diagnosis not present

## 2021-12-24 DIAGNOSIS — N186 End stage renal disease: Secondary | ICD-10-CM | POA: Diagnosis not present

## 2021-12-25 DIAGNOSIS — Z23 Encounter for immunization: Secondary | ICD-10-CM | POA: Diagnosis not present

## 2021-12-25 DIAGNOSIS — N186 End stage renal disease: Secondary | ICD-10-CM | POA: Diagnosis not present

## 2021-12-25 DIAGNOSIS — D631 Anemia in chronic kidney disease: Secondary | ICD-10-CM | POA: Diagnosis not present

## 2021-12-25 DIAGNOSIS — N2581 Secondary hyperparathyroidism of renal origin: Secondary | ICD-10-CM | POA: Diagnosis not present

## 2021-12-26 DIAGNOSIS — N2581 Secondary hyperparathyroidism of renal origin: Secondary | ICD-10-CM | POA: Diagnosis not present

## 2021-12-26 DIAGNOSIS — N186 End stage renal disease: Secondary | ICD-10-CM | POA: Diagnosis not present

## 2021-12-26 DIAGNOSIS — Z23 Encounter for immunization: Secondary | ICD-10-CM | POA: Diagnosis not present

## 2021-12-26 DIAGNOSIS — D631 Anemia in chronic kidney disease: Secondary | ICD-10-CM | POA: Diagnosis not present

## 2021-12-27 DIAGNOSIS — N2581 Secondary hyperparathyroidism of renal origin: Secondary | ICD-10-CM | POA: Diagnosis not present

## 2021-12-27 DIAGNOSIS — N186 End stage renal disease: Secondary | ICD-10-CM | POA: Diagnosis not present

## 2021-12-27 DIAGNOSIS — Z23 Encounter for immunization: Secondary | ICD-10-CM | POA: Diagnosis not present

## 2021-12-27 DIAGNOSIS — D631 Anemia in chronic kidney disease: Secondary | ICD-10-CM | POA: Diagnosis not present

## 2021-12-28 DIAGNOSIS — N186 End stage renal disease: Secondary | ICD-10-CM | POA: Diagnosis not present

## 2021-12-28 DIAGNOSIS — Z23 Encounter for immunization: Secondary | ICD-10-CM | POA: Diagnosis not present

## 2021-12-28 DIAGNOSIS — D631 Anemia in chronic kidney disease: Secondary | ICD-10-CM | POA: Diagnosis not present

## 2021-12-28 DIAGNOSIS — N2581 Secondary hyperparathyroidism of renal origin: Secondary | ICD-10-CM | POA: Diagnosis not present

## 2021-12-29 DIAGNOSIS — N2581 Secondary hyperparathyroidism of renal origin: Secondary | ICD-10-CM | POA: Diagnosis not present

## 2021-12-29 DIAGNOSIS — D631 Anemia in chronic kidney disease: Secondary | ICD-10-CM | POA: Diagnosis not present

## 2021-12-29 DIAGNOSIS — Z23 Encounter for immunization: Secondary | ICD-10-CM | POA: Diagnosis not present

## 2021-12-29 DIAGNOSIS — N186 End stage renal disease: Secondary | ICD-10-CM | POA: Diagnosis not present

## 2021-12-29 DIAGNOSIS — E119 Type 2 diabetes mellitus without complications: Secondary | ICD-10-CM | POA: Diagnosis not present

## 2021-12-30 DIAGNOSIS — N2581 Secondary hyperparathyroidism of renal origin: Secondary | ICD-10-CM | POA: Diagnosis not present

## 2021-12-30 DIAGNOSIS — N186 End stage renal disease: Secondary | ICD-10-CM | POA: Diagnosis not present

## 2021-12-30 DIAGNOSIS — D631 Anemia in chronic kidney disease: Secondary | ICD-10-CM | POA: Diagnosis not present

## 2021-12-30 DIAGNOSIS — Z23 Encounter for immunization: Secondary | ICD-10-CM | POA: Diagnosis not present

## 2021-12-31 DIAGNOSIS — D631 Anemia in chronic kidney disease: Secondary | ICD-10-CM | POA: Diagnosis not present

## 2021-12-31 DIAGNOSIS — N186 End stage renal disease: Secondary | ICD-10-CM | POA: Diagnosis not present

## 2021-12-31 DIAGNOSIS — Z23 Encounter for immunization: Secondary | ICD-10-CM | POA: Diagnosis not present

## 2021-12-31 DIAGNOSIS — N2581 Secondary hyperparathyroidism of renal origin: Secondary | ICD-10-CM | POA: Diagnosis not present

## 2022-01-01 DIAGNOSIS — Z23 Encounter for immunization: Secondary | ICD-10-CM | POA: Diagnosis not present

## 2022-01-01 DIAGNOSIS — D631 Anemia in chronic kidney disease: Secondary | ICD-10-CM | POA: Diagnosis not present

## 2022-01-01 DIAGNOSIS — N2581 Secondary hyperparathyroidism of renal origin: Secondary | ICD-10-CM | POA: Diagnosis not present

## 2022-01-01 DIAGNOSIS — N186 End stage renal disease: Secondary | ICD-10-CM | POA: Diagnosis not present

## 2022-01-02 DIAGNOSIS — Z23 Encounter for immunization: Secondary | ICD-10-CM | POA: Diagnosis not present

## 2022-01-02 DIAGNOSIS — N186 End stage renal disease: Secondary | ICD-10-CM | POA: Diagnosis not present

## 2022-01-02 DIAGNOSIS — D631 Anemia in chronic kidney disease: Secondary | ICD-10-CM | POA: Diagnosis not present

## 2022-01-02 DIAGNOSIS — N2581 Secondary hyperparathyroidism of renal origin: Secondary | ICD-10-CM | POA: Diagnosis not present

## 2022-01-03 DIAGNOSIS — Z23 Encounter for immunization: Secondary | ICD-10-CM | POA: Diagnosis not present

## 2022-01-03 DIAGNOSIS — N2581 Secondary hyperparathyroidism of renal origin: Secondary | ICD-10-CM | POA: Diagnosis not present

## 2022-01-03 DIAGNOSIS — D631 Anemia in chronic kidney disease: Secondary | ICD-10-CM | POA: Diagnosis not present

## 2022-01-03 DIAGNOSIS — N186 End stage renal disease: Secondary | ICD-10-CM | POA: Diagnosis not present

## 2022-01-04 DIAGNOSIS — T8522XA Displacement of intraocular lens, initial encounter: Secondary | ICD-10-CM | POA: Diagnosis not present

## 2022-01-04 DIAGNOSIS — Z23 Encounter for immunization: Secondary | ICD-10-CM | POA: Diagnosis not present

## 2022-01-04 DIAGNOSIS — N186 End stage renal disease: Secondary | ICD-10-CM | POA: Diagnosis not present

## 2022-01-04 DIAGNOSIS — N2581 Secondary hyperparathyroidism of renal origin: Secondary | ICD-10-CM | POA: Diagnosis not present

## 2022-01-04 DIAGNOSIS — D631 Anemia in chronic kidney disease: Secondary | ICD-10-CM | POA: Diagnosis not present

## 2022-01-05 DIAGNOSIS — Z23 Encounter for immunization: Secondary | ICD-10-CM | POA: Diagnosis not present

## 2022-01-05 DIAGNOSIS — N186 End stage renal disease: Secondary | ICD-10-CM | POA: Diagnosis not present

## 2022-01-05 DIAGNOSIS — N2581 Secondary hyperparathyroidism of renal origin: Secondary | ICD-10-CM | POA: Diagnosis not present

## 2022-01-05 DIAGNOSIS — D631 Anemia in chronic kidney disease: Secondary | ICD-10-CM | POA: Diagnosis not present

## 2022-01-06 DIAGNOSIS — D631 Anemia in chronic kidney disease: Secondary | ICD-10-CM | POA: Diagnosis not present

## 2022-01-06 DIAGNOSIS — N186 End stage renal disease: Secondary | ICD-10-CM | POA: Diagnosis not present

## 2022-01-06 DIAGNOSIS — N2581 Secondary hyperparathyroidism of renal origin: Secondary | ICD-10-CM | POA: Diagnosis not present

## 2022-01-06 DIAGNOSIS — Z23 Encounter for immunization: Secondary | ICD-10-CM | POA: Diagnosis not present

## 2022-01-07 DIAGNOSIS — N186 End stage renal disease: Secondary | ICD-10-CM | POA: Diagnosis not present

## 2022-01-07 DIAGNOSIS — D631 Anemia in chronic kidney disease: Secondary | ICD-10-CM | POA: Diagnosis not present

## 2022-01-07 DIAGNOSIS — N2581 Secondary hyperparathyroidism of renal origin: Secondary | ICD-10-CM | POA: Diagnosis not present

## 2022-01-07 DIAGNOSIS — Z23 Encounter for immunization: Secondary | ICD-10-CM | POA: Diagnosis not present

## 2022-01-08 DIAGNOSIS — N2581 Secondary hyperparathyroidism of renal origin: Secondary | ICD-10-CM | POA: Diagnosis not present

## 2022-01-08 DIAGNOSIS — D631 Anemia in chronic kidney disease: Secondary | ICD-10-CM | POA: Diagnosis not present

## 2022-01-08 DIAGNOSIS — Z23 Encounter for immunization: Secondary | ICD-10-CM | POA: Diagnosis not present

## 2022-01-08 DIAGNOSIS — N186 End stage renal disease: Secondary | ICD-10-CM | POA: Diagnosis not present

## 2022-01-09 DIAGNOSIS — Z23 Encounter for immunization: Secondary | ICD-10-CM | POA: Diagnosis not present

## 2022-01-09 DIAGNOSIS — N2581 Secondary hyperparathyroidism of renal origin: Secondary | ICD-10-CM | POA: Diagnosis not present

## 2022-01-09 DIAGNOSIS — D631 Anemia in chronic kidney disease: Secondary | ICD-10-CM | POA: Diagnosis not present

## 2022-01-09 DIAGNOSIS — N186 End stage renal disease: Secondary | ICD-10-CM | POA: Diagnosis not present

## 2022-01-10 DIAGNOSIS — Z23 Encounter for immunization: Secondary | ICD-10-CM | POA: Diagnosis not present

## 2022-01-10 DIAGNOSIS — N186 End stage renal disease: Secondary | ICD-10-CM | POA: Diagnosis not present

## 2022-01-10 DIAGNOSIS — D631 Anemia in chronic kidney disease: Secondary | ICD-10-CM | POA: Diagnosis not present

## 2022-01-10 DIAGNOSIS — N2581 Secondary hyperparathyroidism of renal origin: Secondary | ICD-10-CM | POA: Diagnosis not present

## 2022-01-11 DIAGNOSIS — N2581 Secondary hyperparathyroidism of renal origin: Secondary | ICD-10-CM | POA: Diagnosis not present

## 2022-01-11 DIAGNOSIS — D631 Anemia in chronic kidney disease: Secondary | ICD-10-CM | POA: Diagnosis not present

## 2022-01-11 DIAGNOSIS — Z23 Encounter for immunization: Secondary | ICD-10-CM | POA: Diagnosis not present

## 2022-01-11 DIAGNOSIS — N186 End stage renal disease: Secondary | ICD-10-CM | POA: Diagnosis not present

## 2022-01-12 DIAGNOSIS — Z23 Encounter for immunization: Secondary | ICD-10-CM | POA: Diagnosis not present

## 2022-01-12 DIAGNOSIS — E113542 Type 2 diabetes mellitus with proliferative diabetic retinopathy with combined traction retinal detachment and rhegmatogenous retinal detachment, left eye: Secondary | ICD-10-CM | POA: Diagnosis not present

## 2022-01-12 DIAGNOSIS — D631 Anemia in chronic kidney disease: Secondary | ICD-10-CM | POA: Diagnosis not present

## 2022-01-12 DIAGNOSIS — N186 End stage renal disease: Secondary | ICD-10-CM | POA: Diagnosis not present

## 2022-01-12 DIAGNOSIS — T8522XA Displacement of intraocular lens, initial encounter: Secondary | ICD-10-CM | POA: Diagnosis not present

## 2022-01-12 DIAGNOSIS — E113591 Type 2 diabetes mellitus with proliferative diabetic retinopathy without macular edema, right eye: Secondary | ICD-10-CM | POA: Diagnosis not present

## 2022-01-12 DIAGNOSIS — H2522 Age-related cataract, morgagnian type, left eye: Secondary | ICD-10-CM | POA: Diagnosis not present

## 2022-01-12 DIAGNOSIS — H31091 Other chorioretinal scars, right eye: Secondary | ICD-10-CM | POA: Diagnosis not present

## 2022-01-12 DIAGNOSIS — N2581 Secondary hyperparathyroidism of renal origin: Secondary | ICD-10-CM | POA: Diagnosis not present

## 2022-01-13 DIAGNOSIS — Z23 Encounter for immunization: Secondary | ICD-10-CM | POA: Diagnosis not present

## 2022-01-13 DIAGNOSIS — N2581 Secondary hyperparathyroidism of renal origin: Secondary | ICD-10-CM | POA: Diagnosis not present

## 2022-01-13 DIAGNOSIS — D631 Anemia in chronic kidney disease: Secondary | ICD-10-CM | POA: Diagnosis not present

## 2022-01-13 DIAGNOSIS — N186 End stage renal disease: Secondary | ICD-10-CM | POA: Diagnosis not present

## 2022-01-14 DIAGNOSIS — D631 Anemia in chronic kidney disease: Secondary | ICD-10-CM | POA: Diagnosis not present

## 2022-01-14 DIAGNOSIS — Z23 Encounter for immunization: Secondary | ICD-10-CM | POA: Diagnosis not present

## 2022-01-14 DIAGNOSIS — N186 End stage renal disease: Secondary | ICD-10-CM | POA: Diagnosis not present

## 2022-01-14 DIAGNOSIS — N2581 Secondary hyperparathyroidism of renal origin: Secondary | ICD-10-CM | POA: Diagnosis not present

## 2022-01-15 DIAGNOSIS — N186 End stage renal disease: Secondary | ICD-10-CM | POA: Diagnosis not present

## 2022-01-15 DIAGNOSIS — Z23 Encounter for immunization: Secondary | ICD-10-CM | POA: Diagnosis not present

## 2022-01-15 DIAGNOSIS — N2581 Secondary hyperparathyroidism of renal origin: Secondary | ICD-10-CM | POA: Diagnosis not present

## 2022-01-15 DIAGNOSIS — D631 Anemia in chronic kidney disease: Secondary | ICD-10-CM | POA: Diagnosis not present

## 2022-01-16 DIAGNOSIS — N186 End stage renal disease: Secondary | ICD-10-CM | POA: Diagnosis not present

## 2022-01-16 DIAGNOSIS — Z23 Encounter for immunization: Secondary | ICD-10-CM | POA: Diagnosis not present

## 2022-01-16 DIAGNOSIS — N2581 Secondary hyperparathyroidism of renal origin: Secondary | ICD-10-CM | POA: Diagnosis not present

## 2022-01-16 DIAGNOSIS — D631 Anemia in chronic kidney disease: Secondary | ICD-10-CM | POA: Diagnosis not present

## 2022-01-17 DIAGNOSIS — Z23 Encounter for immunization: Secondary | ICD-10-CM | POA: Diagnosis not present

## 2022-01-17 DIAGNOSIS — D631 Anemia in chronic kidney disease: Secondary | ICD-10-CM | POA: Diagnosis not present

## 2022-01-17 DIAGNOSIS — N2581 Secondary hyperparathyroidism of renal origin: Secondary | ICD-10-CM | POA: Diagnosis not present

## 2022-01-17 DIAGNOSIS — N186 End stage renal disease: Secondary | ICD-10-CM | POA: Diagnosis not present

## 2022-01-18 DIAGNOSIS — Z23 Encounter for immunization: Secondary | ICD-10-CM | POA: Diagnosis not present

## 2022-01-18 DIAGNOSIS — D631 Anemia in chronic kidney disease: Secondary | ICD-10-CM | POA: Diagnosis not present

## 2022-01-18 DIAGNOSIS — N186 End stage renal disease: Secondary | ICD-10-CM | POA: Diagnosis not present

## 2022-01-18 DIAGNOSIS — N2581 Secondary hyperparathyroidism of renal origin: Secondary | ICD-10-CM | POA: Diagnosis not present

## 2022-01-19 DIAGNOSIS — D631 Anemia in chronic kidney disease: Secondary | ICD-10-CM | POA: Diagnosis not present

## 2022-01-19 DIAGNOSIS — N2581 Secondary hyperparathyroidism of renal origin: Secondary | ICD-10-CM | POA: Diagnosis not present

## 2022-01-19 DIAGNOSIS — N186 End stage renal disease: Secondary | ICD-10-CM | POA: Diagnosis not present

## 2022-01-19 DIAGNOSIS — Z23 Encounter for immunization: Secondary | ICD-10-CM | POA: Diagnosis not present

## 2022-01-20 DIAGNOSIS — Z23 Encounter for immunization: Secondary | ICD-10-CM | POA: Diagnosis not present

## 2022-01-20 DIAGNOSIS — N186 End stage renal disease: Secondary | ICD-10-CM | POA: Diagnosis not present

## 2022-01-20 DIAGNOSIS — D631 Anemia in chronic kidney disease: Secondary | ICD-10-CM | POA: Diagnosis not present

## 2022-01-20 DIAGNOSIS — N2581 Secondary hyperparathyroidism of renal origin: Secondary | ICD-10-CM | POA: Diagnosis not present

## 2022-01-21 DIAGNOSIS — N186 End stage renal disease: Secondary | ICD-10-CM | POA: Diagnosis not present

## 2022-01-21 DIAGNOSIS — N2581 Secondary hyperparathyroidism of renal origin: Secondary | ICD-10-CM | POA: Diagnosis not present

## 2022-01-21 DIAGNOSIS — Z23 Encounter for immunization: Secondary | ICD-10-CM | POA: Diagnosis not present

## 2022-01-21 DIAGNOSIS — D631 Anemia in chronic kidney disease: Secondary | ICD-10-CM | POA: Diagnosis not present

## 2022-01-22 DIAGNOSIS — N186 End stage renal disease: Secondary | ICD-10-CM | POA: Diagnosis not present

## 2022-01-22 DIAGNOSIS — D631 Anemia in chronic kidney disease: Secondary | ICD-10-CM | POA: Diagnosis not present

## 2022-01-22 DIAGNOSIS — N2581 Secondary hyperparathyroidism of renal origin: Secondary | ICD-10-CM | POA: Diagnosis not present

## 2022-01-22 DIAGNOSIS — Z23 Encounter for immunization: Secondary | ICD-10-CM | POA: Diagnosis not present

## 2022-01-23 DIAGNOSIS — Z23 Encounter for immunization: Secondary | ICD-10-CM | POA: Diagnosis not present

## 2022-01-23 DIAGNOSIS — D631 Anemia in chronic kidney disease: Secondary | ICD-10-CM | POA: Diagnosis not present

## 2022-01-23 DIAGNOSIS — N2581 Secondary hyperparathyroidism of renal origin: Secondary | ICD-10-CM | POA: Diagnosis not present

## 2022-01-23 DIAGNOSIS — N186 End stage renal disease: Secondary | ICD-10-CM | POA: Diagnosis not present

## 2022-01-24 DIAGNOSIS — D631 Anemia in chronic kidney disease: Secondary | ICD-10-CM | POA: Diagnosis not present

## 2022-01-24 DIAGNOSIS — N2581 Secondary hyperparathyroidism of renal origin: Secondary | ICD-10-CM | POA: Diagnosis not present

## 2022-01-24 DIAGNOSIS — N186 End stage renal disease: Secondary | ICD-10-CM | POA: Diagnosis not present

## 2022-01-25 DIAGNOSIS — D631 Anemia in chronic kidney disease: Secondary | ICD-10-CM | POA: Diagnosis not present

## 2022-01-25 DIAGNOSIS — N2581 Secondary hyperparathyroidism of renal origin: Secondary | ICD-10-CM | POA: Diagnosis not present

## 2022-01-25 DIAGNOSIS — N186 End stage renal disease: Secondary | ICD-10-CM | POA: Diagnosis not present

## 2022-01-26 DIAGNOSIS — D631 Anemia in chronic kidney disease: Secondary | ICD-10-CM | POA: Diagnosis not present

## 2022-01-26 DIAGNOSIS — N186 End stage renal disease: Secondary | ICD-10-CM | POA: Diagnosis not present

## 2022-01-26 DIAGNOSIS — N2581 Secondary hyperparathyroidism of renal origin: Secondary | ICD-10-CM | POA: Diagnosis not present

## 2022-01-27 DIAGNOSIS — D631 Anemia in chronic kidney disease: Secondary | ICD-10-CM | POA: Diagnosis not present

## 2022-01-27 DIAGNOSIS — N186 End stage renal disease: Secondary | ICD-10-CM | POA: Diagnosis not present

## 2022-01-27 DIAGNOSIS — N2581 Secondary hyperparathyroidism of renal origin: Secondary | ICD-10-CM | POA: Diagnosis not present

## 2022-01-28 DIAGNOSIS — N186 End stage renal disease: Secondary | ICD-10-CM | POA: Diagnosis not present

## 2022-01-28 DIAGNOSIS — N2581 Secondary hyperparathyroidism of renal origin: Secondary | ICD-10-CM | POA: Diagnosis not present

## 2022-01-28 DIAGNOSIS — D631 Anemia in chronic kidney disease: Secondary | ICD-10-CM | POA: Diagnosis not present

## 2022-01-29 DIAGNOSIS — D631 Anemia in chronic kidney disease: Secondary | ICD-10-CM | POA: Diagnosis not present

## 2022-01-29 DIAGNOSIS — N2581 Secondary hyperparathyroidism of renal origin: Secondary | ICD-10-CM | POA: Diagnosis not present

## 2022-01-29 DIAGNOSIS — N186 End stage renal disease: Secondary | ICD-10-CM | POA: Diagnosis not present

## 2022-01-30 DIAGNOSIS — N2581 Secondary hyperparathyroidism of renal origin: Secondary | ICD-10-CM | POA: Diagnosis not present

## 2022-01-30 DIAGNOSIS — N186 End stage renal disease: Secondary | ICD-10-CM | POA: Diagnosis not present

## 2022-01-30 DIAGNOSIS — D631 Anemia in chronic kidney disease: Secondary | ICD-10-CM | POA: Diagnosis not present

## 2022-01-31 DIAGNOSIS — D631 Anemia in chronic kidney disease: Secondary | ICD-10-CM | POA: Diagnosis not present

## 2022-01-31 DIAGNOSIS — N2581 Secondary hyperparathyroidism of renal origin: Secondary | ICD-10-CM | POA: Diagnosis not present

## 2022-01-31 DIAGNOSIS — N186 End stage renal disease: Secondary | ICD-10-CM | POA: Diagnosis not present

## 2022-02-01 DIAGNOSIS — N186 End stage renal disease: Secondary | ICD-10-CM | POA: Diagnosis not present

## 2022-02-01 DIAGNOSIS — N2581 Secondary hyperparathyroidism of renal origin: Secondary | ICD-10-CM | POA: Diagnosis not present

## 2022-02-01 DIAGNOSIS — D631 Anemia in chronic kidney disease: Secondary | ICD-10-CM | POA: Diagnosis not present

## 2022-02-02 DIAGNOSIS — N2581 Secondary hyperparathyroidism of renal origin: Secondary | ICD-10-CM | POA: Diagnosis not present

## 2022-02-02 DIAGNOSIS — N186 End stage renal disease: Secondary | ICD-10-CM | POA: Diagnosis not present

## 2022-02-02 DIAGNOSIS — E119 Type 2 diabetes mellitus without complications: Secondary | ICD-10-CM | POA: Diagnosis not present

## 2022-02-02 DIAGNOSIS — D631 Anemia in chronic kidney disease: Secondary | ICD-10-CM | POA: Diagnosis not present

## 2022-02-03 DIAGNOSIS — N2581 Secondary hyperparathyroidism of renal origin: Secondary | ICD-10-CM | POA: Diagnosis not present

## 2022-02-03 DIAGNOSIS — N186 End stage renal disease: Secondary | ICD-10-CM | POA: Diagnosis not present

## 2022-02-03 DIAGNOSIS — D631 Anemia in chronic kidney disease: Secondary | ICD-10-CM | POA: Diagnosis not present

## 2022-02-04 DIAGNOSIS — D631 Anemia in chronic kidney disease: Secondary | ICD-10-CM | POA: Diagnosis not present

## 2022-02-04 DIAGNOSIS — N186 End stage renal disease: Secondary | ICD-10-CM | POA: Diagnosis not present

## 2022-02-04 DIAGNOSIS — N2581 Secondary hyperparathyroidism of renal origin: Secondary | ICD-10-CM | POA: Diagnosis not present

## 2022-02-05 DIAGNOSIS — D631 Anemia in chronic kidney disease: Secondary | ICD-10-CM | POA: Diagnosis not present

## 2022-02-05 DIAGNOSIS — N186 End stage renal disease: Secondary | ICD-10-CM | POA: Diagnosis not present

## 2022-02-05 DIAGNOSIS — N2581 Secondary hyperparathyroidism of renal origin: Secondary | ICD-10-CM | POA: Diagnosis not present

## 2022-02-06 DIAGNOSIS — N186 End stage renal disease: Secondary | ICD-10-CM | POA: Diagnosis not present

## 2022-02-06 DIAGNOSIS — N2581 Secondary hyperparathyroidism of renal origin: Secondary | ICD-10-CM | POA: Diagnosis not present

## 2022-02-06 DIAGNOSIS — D631 Anemia in chronic kidney disease: Secondary | ICD-10-CM | POA: Diagnosis not present

## 2022-02-07 DIAGNOSIS — N2581 Secondary hyperparathyroidism of renal origin: Secondary | ICD-10-CM | POA: Diagnosis not present

## 2022-02-07 DIAGNOSIS — D631 Anemia in chronic kidney disease: Secondary | ICD-10-CM | POA: Diagnosis not present

## 2022-02-07 DIAGNOSIS — N186 End stage renal disease: Secondary | ICD-10-CM | POA: Diagnosis not present

## 2022-02-08 DIAGNOSIS — N2581 Secondary hyperparathyroidism of renal origin: Secondary | ICD-10-CM | POA: Diagnosis not present

## 2022-02-08 DIAGNOSIS — D631 Anemia in chronic kidney disease: Secondary | ICD-10-CM | POA: Diagnosis not present

## 2022-02-08 DIAGNOSIS — N186 End stage renal disease: Secondary | ICD-10-CM | POA: Diagnosis not present

## 2022-02-09 DIAGNOSIS — N186 End stage renal disease: Secondary | ICD-10-CM | POA: Diagnosis not present

## 2022-02-09 DIAGNOSIS — H43391 Other vitreous opacities, right eye: Secondary | ICD-10-CM | POA: Diagnosis not present

## 2022-02-09 DIAGNOSIS — D631 Anemia in chronic kidney disease: Secondary | ICD-10-CM | POA: Diagnosis not present

## 2022-02-09 DIAGNOSIS — N2581 Secondary hyperparathyroidism of renal origin: Secondary | ICD-10-CM | POA: Diagnosis not present

## 2022-02-09 DIAGNOSIS — E113591 Type 2 diabetes mellitus with proliferative diabetic retinopathy without macular edema, right eye: Secondary | ICD-10-CM | POA: Diagnosis not present

## 2022-02-09 DIAGNOSIS — T8522XA Displacement of intraocular lens, initial encounter: Secondary | ICD-10-CM | POA: Diagnosis not present

## 2022-02-09 DIAGNOSIS — H2522 Age-related cataract, morgagnian type, left eye: Secondary | ICD-10-CM | POA: Diagnosis not present

## 2022-02-10 DIAGNOSIS — N2581 Secondary hyperparathyroidism of renal origin: Secondary | ICD-10-CM | POA: Diagnosis not present

## 2022-02-10 DIAGNOSIS — N186 End stage renal disease: Secondary | ICD-10-CM | POA: Diagnosis not present

## 2022-02-10 DIAGNOSIS — D631 Anemia in chronic kidney disease: Secondary | ICD-10-CM | POA: Diagnosis not present

## 2022-02-11 DIAGNOSIS — N186 End stage renal disease: Secondary | ICD-10-CM | POA: Diagnosis not present

## 2022-02-11 DIAGNOSIS — N2581 Secondary hyperparathyroidism of renal origin: Secondary | ICD-10-CM | POA: Diagnosis not present

## 2022-02-11 DIAGNOSIS — D631 Anemia in chronic kidney disease: Secondary | ICD-10-CM | POA: Diagnosis not present

## 2022-02-12 DIAGNOSIS — N186 End stage renal disease: Secondary | ICD-10-CM | POA: Diagnosis not present

## 2022-02-12 DIAGNOSIS — N2581 Secondary hyperparathyroidism of renal origin: Secondary | ICD-10-CM | POA: Diagnosis not present

## 2022-02-12 DIAGNOSIS — D631 Anemia in chronic kidney disease: Secondary | ICD-10-CM | POA: Diagnosis not present

## 2022-02-13 DIAGNOSIS — N2581 Secondary hyperparathyroidism of renal origin: Secondary | ICD-10-CM | POA: Diagnosis not present

## 2022-02-13 DIAGNOSIS — D631 Anemia in chronic kidney disease: Secondary | ICD-10-CM | POA: Diagnosis not present

## 2022-02-13 DIAGNOSIS — N186 End stage renal disease: Secondary | ICD-10-CM | POA: Diagnosis not present

## 2022-02-14 DIAGNOSIS — N2581 Secondary hyperparathyroidism of renal origin: Secondary | ICD-10-CM | POA: Diagnosis not present

## 2022-02-14 DIAGNOSIS — D631 Anemia in chronic kidney disease: Secondary | ICD-10-CM | POA: Diagnosis not present

## 2022-02-14 DIAGNOSIS — N186 End stage renal disease: Secondary | ICD-10-CM | POA: Diagnosis not present

## 2022-02-15 DIAGNOSIS — N2581 Secondary hyperparathyroidism of renal origin: Secondary | ICD-10-CM | POA: Diagnosis not present

## 2022-02-15 DIAGNOSIS — N186 End stage renal disease: Secondary | ICD-10-CM | POA: Diagnosis not present

## 2022-02-15 DIAGNOSIS — D631 Anemia in chronic kidney disease: Secondary | ICD-10-CM | POA: Diagnosis not present

## 2022-02-16 DIAGNOSIS — N2581 Secondary hyperparathyroidism of renal origin: Secondary | ICD-10-CM | POA: Diagnosis not present

## 2022-02-16 DIAGNOSIS — N186 End stage renal disease: Secondary | ICD-10-CM | POA: Diagnosis not present

## 2022-02-16 DIAGNOSIS — D631 Anemia in chronic kidney disease: Secondary | ICD-10-CM | POA: Diagnosis not present

## 2022-02-17 DIAGNOSIS — N186 End stage renal disease: Secondary | ICD-10-CM | POA: Diagnosis not present

## 2022-02-17 DIAGNOSIS — D631 Anemia in chronic kidney disease: Secondary | ICD-10-CM | POA: Diagnosis not present

## 2022-02-17 DIAGNOSIS — N2581 Secondary hyperparathyroidism of renal origin: Secondary | ICD-10-CM | POA: Diagnosis not present

## 2022-02-18 DIAGNOSIS — N2581 Secondary hyperparathyroidism of renal origin: Secondary | ICD-10-CM | POA: Diagnosis not present

## 2022-02-18 DIAGNOSIS — D631 Anemia in chronic kidney disease: Secondary | ICD-10-CM | POA: Diagnosis not present

## 2022-02-18 DIAGNOSIS — N186 End stage renal disease: Secondary | ICD-10-CM | POA: Diagnosis not present

## 2022-02-19 DIAGNOSIS — N2581 Secondary hyperparathyroidism of renal origin: Secondary | ICD-10-CM | POA: Diagnosis not present

## 2022-02-19 DIAGNOSIS — D631 Anemia in chronic kidney disease: Secondary | ICD-10-CM | POA: Diagnosis not present

## 2022-02-19 DIAGNOSIS — N186 End stage renal disease: Secondary | ICD-10-CM | POA: Diagnosis not present

## 2022-02-20 DIAGNOSIS — N186 End stage renal disease: Secondary | ICD-10-CM | POA: Diagnosis not present

## 2022-02-20 DIAGNOSIS — D631 Anemia in chronic kidney disease: Secondary | ICD-10-CM | POA: Diagnosis not present

## 2022-02-20 DIAGNOSIS — N2581 Secondary hyperparathyroidism of renal origin: Secondary | ICD-10-CM | POA: Diagnosis not present

## 2022-02-21 DIAGNOSIS — N186 End stage renal disease: Secondary | ICD-10-CM | POA: Diagnosis not present

## 2022-02-21 DIAGNOSIS — N2581 Secondary hyperparathyroidism of renal origin: Secondary | ICD-10-CM | POA: Diagnosis not present

## 2022-02-21 DIAGNOSIS — D631 Anemia in chronic kidney disease: Secondary | ICD-10-CM | POA: Diagnosis not present

## 2022-02-22 DIAGNOSIS — R932 Abnormal findings on diagnostic imaging of liver and biliary tract: Secondary | ICD-10-CM | POA: Diagnosis not present

## 2022-02-22 DIAGNOSIS — Z992 Dependence on renal dialysis: Secondary | ICD-10-CM | POA: Diagnosis not present

## 2022-02-22 DIAGNOSIS — N186 End stage renal disease: Secondary | ICD-10-CM | POA: Diagnosis not present

## 2022-02-22 DIAGNOSIS — D631 Anemia in chronic kidney disease: Secondary | ICD-10-CM | POA: Diagnosis not present

## 2022-02-22 DIAGNOSIS — N2581 Secondary hyperparathyroidism of renal origin: Secondary | ICD-10-CM | POA: Diagnosis not present

## 2022-02-23 DIAGNOSIS — D631 Anemia in chronic kidney disease: Secondary | ICD-10-CM | POA: Diagnosis not present

## 2022-02-23 DIAGNOSIS — N186 End stage renal disease: Secondary | ICD-10-CM | POA: Diagnosis not present

## 2022-02-23 DIAGNOSIS — N2581 Secondary hyperparathyroidism of renal origin: Secondary | ICD-10-CM | POA: Diagnosis not present

## 2022-02-24 DIAGNOSIS — D631 Anemia in chronic kidney disease: Secondary | ICD-10-CM | POA: Diagnosis not present

## 2022-02-24 DIAGNOSIS — N186 End stage renal disease: Secondary | ICD-10-CM | POA: Diagnosis not present

## 2022-02-24 DIAGNOSIS — N2581 Secondary hyperparathyroidism of renal origin: Secondary | ICD-10-CM | POA: Diagnosis not present

## 2022-02-25 DIAGNOSIS — D631 Anemia in chronic kidney disease: Secondary | ICD-10-CM | POA: Diagnosis not present

## 2022-02-25 DIAGNOSIS — N2581 Secondary hyperparathyroidism of renal origin: Secondary | ICD-10-CM | POA: Diagnosis not present

## 2022-02-25 DIAGNOSIS — N186 End stage renal disease: Secondary | ICD-10-CM | POA: Diagnosis not present

## 2022-02-26 DIAGNOSIS — N186 End stage renal disease: Secondary | ICD-10-CM | POA: Diagnosis not present

## 2022-02-26 DIAGNOSIS — D631 Anemia in chronic kidney disease: Secondary | ICD-10-CM | POA: Diagnosis not present

## 2022-02-26 DIAGNOSIS — N2581 Secondary hyperparathyroidism of renal origin: Secondary | ICD-10-CM | POA: Diagnosis not present

## 2022-02-27 DIAGNOSIS — N186 End stage renal disease: Secondary | ICD-10-CM | POA: Diagnosis not present

## 2022-02-27 DIAGNOSIS — N2581 Secondary hyperparathyroidism of renal origin: Secondary | ICD-10-CM | POA: Diagnosis not present

## 2022-02-27 DIAGNOSIS — D631 Anemia in chronic kidney disease: Secondary | ICD-10-CM | POA: Diagnosis not present

## 2022-02-28 DIAGNOSIS — N186 End stage renal disease: Secondary | ICD-10-CM | POA: Diagnosis not present

## 2022-02-28 DIAGNOSIS — N2581 Secondary hyperparathyroidism of renal origin: Secondary | ICD-10-CM | POA: Diagnosis not present

## 2022-02-28 DIAGNOSIS — D631 Anemia in chronic kidney disease: Secondary | ICD-10-CM | POA: Diagnosis not present

## 2022-03-01 DIAGNOSIS — D631 Anemia in chronic kidney disease: Secondary | ICD-10-CM | POA: Diagnosis not present

## 2022-03-01 DIAGNOSIS — N186 End stage renal disease: Secondary | ICD-10-CM | POA: Diagnosis not present

## 2022-03-01 DIAGNOSIS — N2581 Secondary hyperparathyroidism of renal origin: Secondary | ICD-10-CM | POA: Diagnosis not present

## 2022-03-02 DIAGNOSIS — N186 End stage renal disease: Secondary | ICD-10-CM | POA: Diagnosis not present

## 2022-03-02 DIAGNOSIS — N2581 Secondary hyperparathyroidism of renal origin: Secondary | ICD-10-CM | POA: Diagnosis not present

## 2022-03-02 DIAGNOSIS — D631 Anemia in chronic kidney disease: Secondary | ICD-10-CM | POA: Diagnosis not present

## 2022-03-03 DIAGNOSIS — D631 Anemia in chronic kidney disease: Secondary | ICD-10-CM | POA: Diagnosis not present

## 2022-03-03 DIAGNOSIS — N2581 Secondary hyperparathyroidism of renal origin: Secondary | ICD-10-CM | POA: Diagnosis not present

## 2022-03-03 DIAGNOSIS — N186 End stage renal disease: Secondary | ICD-10-CM | POA: Diagnosis not present

## 2022-03-04 DIAGNOSIS — D631 Anemia in chronic kidney disease: Secondary | ICD-10-CM | POA: Diagnosis not present

## 2022-03-04 DIAGNOSIS — N186 End stage renal disease: Secondary | ICD-10-CM | POA: Diagnosis not present

## 2022-03-04 DIAGNOSIS — N2581 Secondary hyperparathyroidism of renal origin: Secondary | ICD-10-CM | POA: Diagnosis not present

## 2022-03-05 DIAGNOSIS — N186 End stage renal disease: Secondary | ICD-10-CM | POA: Diagnosis not present

## 2022-03-05 DIAGNOSIS — D631 Anemia in chronic kidney disease: Secondary | ICD-10-CM | POA: Diagnosis not present

## 2022-03-05 DIAGNOSIS — N2581 Secondary hyperparathyroidism of renal origin: Secondary | ICD-10-CM | POA: Diagnosis not present

## 2022-03-06 DIAGNOSIS — N186 End stage renal disease: Secondary | ICD-10-CM | POA: Diagnosis not present

## 2022-03-06 DIAGNOSIS — D631 Anemia in chronic kidney disease: Secondary | ICD-10-CM | POA: Diagnosis not present

## 2022-03-06 DIAGNOSIS — N2581 Secondary hyperparathyroidism of renal origin: Secondary | ICD-10-CM | POA: Diagnosis not present

## 2022-03-07 DIAGNOSIS — N186 End stage renal disease: Secondary | ICD-10-CM | POA: Diagnosis not present

## 2022-03-07 DIAGNOSIS — N2581 Secondary hyperparathyroidism of renal origin: Secondary | ICD-10-CM | POA: Diagnosis not present

## 2022-03-07 DIAGNOSIS — E119 Type 2 diabetes mellitus without complications: Secondary | ICD-10-CM | POA: Diagnosis not present

## 2022-03-07 DIAGNOSIS — D631 Anemia in chronic kidney disease: Secondary | ICD-10-CM | POA: Diagnosis not present

## 2022-03-08 DIAGNOSIS — N2581 Secondary hyperparathyroidism of renal origin: Secondary | ICD-10-CM | POA: Diagnosis not present

## 2022-03-08 DIAGNOSIS — D631 Anemia in chronic kidney disease: Secondary | ICD-10-CM | POA: Diagnosis not present

## 2022-03-08 DIAGNOSIS — N186 End stage renal disease: Secondary | ICD-10-CM | POA: Diagnosis not present

## 2022-03-09 DIAGNOSIS — D631 Anemia in chronic kidney disease: Secondary | ICD-10-CM | POA: Diagnosis not present

## 2022-03-09 DIAGNOSIS — N2581 Secondary hyperparathyroidism of renal origin: Secondary | ICD-10-CM | POA: Diagnosis not present

## 2022-03-09 DIAGNOSIS — N186 End stage renal disease: Secondary | ICD-10-CM | POA: Diagnosis not present

## 2022-03-10 DIAGNOSIS — D631 Anemia in chronic kidney disease: Secondary | ICD-10-CM | POA: Diagnosis not present

## 2022-03-10 DIAGNOSIS — N2581 Secondary hyperparathyroidism of renal origin: Secondary | ICD-10-CM | POA: Diagnosis not present

## 2022-03-10 DIAGNOSIS — N186 End stage renal disease: Secondary | ICD-10-CM | POA: Diagnosis not present

## 2022-03-11 DIAGNOSIS — N186 End stage renal disease: Secondary | ICD-10-CM | POA: Diagnosis not present

## 2022-03-11 DIAGNOSIS — N2581 Secondary hyperparathyroidism of renal origin: Secondary | ICD-10-CM | POA: Diagnosis not present

## 2022-03-11 DIAGNOSIS — D631 Anemia in chronic kidney disease: Secondary | ICD-10-CM | POA: Diagnosis not present

## 2022-03-12 DIAGNOSIS — N186 End stage renal disease: Secondary | ICD-10-CM | POA: Diagnosis not present

## 2022-03-12 DIAGNOSIS — N2581 Secondary hyperparathyroidism of renal origin: Secondary | ICD-10-CM | POA: Diagnosis not present

## 2022-03-12 DIAGNOSIS — D631 Anemia in chronic kidney disease: Secondary | ICD-10-CM | POA: Diagnosis not present

## 2022-03-13 DIAGNOSIS — N2581 Secondary hyperparathyroidism of renal origin: Secondary | ICD-10-CM | POA: Diagnosis not present

## 2022-03-13 DIAGNOSIS — D631 Anemia in chronic kidney disease: Secondary | ICD-10-CM | POA: Diagnosis not present

## 2022-03-13 DIAGNOSIS — N186 End stage renal disease: Secondary | ICD-10-CM | POA: Diagnosis not present

## 2022-03-14 DIAGNOSIS — N2581 Secondary hyperparathyroidism of renal origin: Secondary | ICD-10-CM | POA: Diagnosis not present

## 2022-03-14 DIAGNOSIS — N186 End stage renal disease: Secondary | ICD-10-CM | POA: Diagnosis not present

## 2022-03-14 DIAGNOSIS — D631 Anemia in chronic kidney disease: Secondary | ICD-10-CM | POA: Diagnosis not present

## 2022-03-15 DIAGNOSIS — D631 Anemia in chronic kidney disease: Secondary | ICD-10-CM | POA: Diagnosis not present

## 2022-03-15 DIAGNOSIS — N2581 Secondary hyperparathyroidism of renal origin: Secondary | ICD-10-CM | POA: Diagnosis not present

## 2022-03-15 DIAGNOSIS — N186 End stage renal disease: Secondary | ICD-10-CM | POA: Diagnosis not present

## 2022-03-16 DIAGNOSIS — N186 End stage renal disease: Secondary | ICD-10-CM | POA: Diagnosis not present

## 2022-03-16 DIAGNOSIS — D631 Anemia in chronic kidney disease: Secondary | ICD-10-CM | POA: Diagnosis not present

## 2022-03-16 DIAGNOSIS — N2581 Secondary hyperparathyroidism of renal origin: Secondary | ICD-10-CM | POA: Diagnosis not present

## 2022-03-17 DIAGNOSIS — N2581 Secondary hyperparathyroidism of renal origin: Secondary | ICD-10-CM | POA: Diagnosis not present

## 2022-03-17 DIAGNOSIS — D631 Anemia in chronic kidney disease: Secondary | ICD-10-CM | POA: Diagnosis not present

## 2022-03-17 DIAGNOSIS — N186 End stage renal disease: Secondary | ICD-10-CM | POA: Diagnosis not present

## 2022-03-18 DIAGNOSIS — N2581 Secondary hyperparathyroidism of renal origin: Secondary | ICD-10-CM | POA: Diagnosis not present

## 2022-03-18 DIAGNOSIS — N186 End stage renal disease: Secondary | ICD-10-CM | POA: Diagnosis not present

## 2022-03-18 DIAGNOSIS — D631 Anemia in chronic kidney disease: Secondary | ICD-10-CM | POA: Diagnosis not present

## 2022-03-19 DIAGNOSIS — N186 End stage renal disease: Secondary | ICD-10-CM | POA: Diagnosis not present

## 2022-03-19 DIAGNOSIS — N2581 Secondary hyperparathyroidism of renal origin: Secondary | ICD-10-CM | POA: Diagnosis not present

## 2022-03-19 DIAGNOSIS — D631 Anemia in chronic kidney disease: Secondary | ICD-10-CM | POA: Diagnosis not present

## 2022-03-20 DIAGNOSIS — N186 End stage renal disease: Secondary | ICD-10-CM | POA: Diagnosis not present

## 2022-03-20 DIAGNOSIS — D631 Anemia in chronic kidney disease: Secondary | ICD-10-CM | POA: Diagnosis not present

## 2022-03-20 DIAGNOSIS — N2581 Secondary hyperparathyroidism of renal origin: Secondary | ICD-10-CM | POA: Diagnosis not present

## 2022-03-21 DIAGNOSIS — N186 End stage renal disease: Secondary | ICD-10-CM | POA: Diagnosis not present

## 2022-03-21 DIAGNOSIS — D631 Anemia in chronic kidney disease: Secondary | ICD-10-CM | POA: Diagnosis not present

## 2022-03-21 DIAGNOSIS — N2581 Secondary hyperparathyroidism of renal origin: Secondary | ICD-10-CM | POA: Diagnosis not present

## 2022-03-22 DIAGNOSIS — D631 Anemia in chronic kidney disease: Secondary | ICD-10-CM | POA: Diagnosis not present

## 2022-03-22 DIAGNOSIS — N186 End stage renal disease: Secondary | ICD-10-CM | POA: Diagnosis not present

## 2022-03-22 DIAGNOSIS — N2581 Secondary hyperparathyroidism of renal origin: Secondary | ICD-10-CM | POA: Diagnosis not present

## 2022-03-23 DIAGNOSIS — D631 Anemia in chronic kidney disease: Secondary | ICD-10-CM | POA: Diagnosis not present

## 2022-03-23 DIAGNOSIS — N2581 Secondary hyperparathyroidism of renal origin: Secondary | ICD-10-CM | POA: Diagnosis not present

## 2022-03-23 DIAGNOSIS — N186 End stage renal disease: Secondary | ICD-10-CM | POA: Diagnosis not present

## 2022-03-24 DIAGNOSIS — N2581 Secondary hyperparathyroidism of renal origin: Secondary | ICD-10-CM | POA: Diagnosis not present

## 2022-03-24 DIAGNOSIS — N186 End stage renal disease: Secondary | ICD-10-CM | POA: Diagnosis not present

## 2022-03-24 DIAGNOSIS — D631 Anemia in chronic kidney disease: Secondary | ICD-10-CM | POA: Diagnosis not present

## 2022-03-25 DIAGNOSIS — N2581 Secondary hyperparathyroidism of renal origin: Secondary | ICD-10-CM | POA: Diagnosis not present

## 2022-03-25 DIAGNOSIS — N186 End stage renal disease: Secondary | ICD-10-CM | POA: Diagnosis not present

## 2022-03-25 DIAGNOSIS — D631 Anemia in chronic kidney disease: Secondary | ICD-10-CM | POA: Diagnosis not present

## 2022-03-25 DIAGNOSIS — Z992 Dependence on renal dialysis: Secondary | ICD-10-CM | POA: Diagnosis not present

## 2022-03-26 DIAGNOSIS — N2581 Secondary hyperparathyroidism of renal origin: Secondary | ICD-10-CM | POA: Diagnosis not present

## 2022-03-26 DIAGNOSIS — N186 End stage renal disease: Secondary | ICD-10-CM | POA: Diagnosis not present

## 2022-03-26 DIAGNOSIS — D631 Anemia in chronic kidney disease: Secondary | ICD-10-CM | POA: Diagnosis not present

## 2022-03-26 HISTORY — PX: EXPLORATORY LAPAROTOMY: SUR591

## 2022-03-27 DIAGNOSIS — N186 End stage renal disease: Secondary | ICD-10-CM | POA: Diagnosis not present

## 2022-03-27 DIAGNOSIS — N2581 Secondary hyperparathyroidism of renal origin: Secondary | ICD-10-CM | POA: Diagnosis not present

## 2022-03-27 DIAGNOSIS — D631 Anemia in chronic kidney disease: Secondary | ICD-10-CM | POA: Diagnosis not present

## 2022-03-28 DIAGNOSIS — N2581 Secondary hyperparathyroidism of renal origin: Secondary | ICD-10-CM | POA: Diagnosis not present

## 2022-03-28 DIAGNOSIS — D631 Anemia in chronic kidney disease: Secondary | ICD-10-CM | POA: Diagnosis not present

## 2022-03-28 DIAGNOSIS — N186 End stage renal disease: Secondary | ICD-10-CM | POA: Diagnosis not present

## 2022-03-29 DIAGNOSIS — N2581 Secondary hyperparathyroidism of renal origin: Secondary | ICD-10-CM | POA: Diagnosis not present

## 2022-03-29 DIAGNOSIS — D631 Anemia in chronic kidney disease: Secondary | ICD-10-CM | POA: Diagnosis not present

## 2022-03-29 DIAGNOSIS — N186 End stage renal disease: Secondary | ICD-10-CM | POA: Diagnosis not present

## 2022-03-30 DIAGNOSIS — N186 End stage renal disease: Secondary | ICD-10-CM | POA: Diagnosis not present

## 2022-03-30 DIAGNOSIS — D631 Anemia in chronic kidney disease: Secondary | ICD-10-CM | POA: Diagnosis not present

## 2022-03-30 DIAGNOSIS — E119 Type 2 diabetes mellitus without complications: Secondary | ICD-10-CM | POA: Diagnosis not present

## 2022-03-30 DIAGNOSIS — N2581 Secondary hyperparathyroidism of renal origin: Secondary | ICD-10-CM | POA: Diagnosis not present

## 2022-03-31 DIAGNOSIS — N186 End stage renal disease: Secondary | ICD-10-CM | POA: Diagnosis not present

## 2022-03-31 DIAGNOSIS — N2581 Secondary hyperparathyroidism of renal origin: Secondary | ICD-10-CM | POA: Diagnosis not present

## 2022-03-31 DIAGNOSIS — D631 Anemia in chronic kidney disease: Secondary | ICD-10-CM | POA: Diagnosis not present

## 2022-04-01 DIAGNOSIS — N186 End stage renal disease: Secondary | ICD-10-CM | POA: Diagnosis not present

## 2022-04-01 DIAGNOSIS — N2581 Secondary hyperparathyroidism of renal origin: Secondary | ICD-10-CM | POA: Diagnosis not present

## 2022-04-01 DIAGNOSIS — D631 Anemia in chronic kidney disease: Secondary | ICD-10-CM | POA: Diagnosis not present

## 2022-04-02 DIAGNOSIS — N2581 Secondary hyperparathyroidism of renal origin: Secondary | ICD-10-CM | POA: Diagnosis not present

## 2022-04-02 DIAGNOSIS — D631 Anemia in chronic kidney disease: Secondary | ICD-10-CM | POA: Diagnosis not present

## 2022-04-02 DIAGNOSIS — N186 End stage renal disease: Secondary | ICD-10-CM | POA: Diagnosis not present

## 2022-04-03 DIAGNOSIS — N2581 Secondary hyperparathyroidism of renal origin: Secondary | ICD-10-CM | POA: Diagnosis not present

## 2022-04-03 DIAGNOSIS — N186 End stage renal disease: Secondary | ICD-10-CM | POA: Diagnosis not present

## 2022-04-03 DIAGNOSIS — D631 Anemia in chronic kidney disease: Secondary | ICD-10-CM | POA: Diagnosis not present

## 2022-04-04 DIAGNOSIS — D631 Anemia in chronic kidney disease: Secondary | ICD-10-CM | POA: Diagnosis not present

## 2022-04-04 DIAGNOSIS — N186 End stage renal disease: Secondary | ICD-10-CM | POA: Diagnosis not present

## 2022-04-04 DIAGNOSIS — N2581 Secondary hyperparathyroidism of renal origin: Secondary | ICD-10-CM | POA: Diagnosis not present

## 2022-04-05 DIAGNOSIS — D631 Anemia in chronic kidney disease: Secondary | ICD-10-CM | POA: Diagnosis not present

## 2022-04-05 DIAGNOSIS — N186 End stage renal disease: Secondary | ICD-10-CM | POA: Diagnosis not present

## 2022-04-05 DIAGNOSIS — N2581 Secondary hyperparathyroidism of renal origin: Secondary | ICD-10-CM | POA: Diagnosis not present

## 2022-04-06 DIAGNOSIS — N2581 Secondary hyperparathyroidism of renal origin: Secondary | ICD-10-CM | POA: Diagnosis not present

## 2022-04-06 DIAGNOSIS — D631 Anemia in chronic kidney disease: Secondary | ICD-10-CM | POA: Diagnosis not present

## 2022-04-06 DIAGNOSIS — N186 End stage renal disease: Secondary | ICD-10-CM | POA: Diagnosis not present

## 2022-04-07 DIAGNOSIS — N186 End stage renal disease: Secondary | ICD-10-CM | POA: Diagnosis not present

## 2022-04-07 DIAGNOSIS — N2581 Secondary hyperparathyroidism of renal origin: Secondary | ICD-10-CM | POA: Diagnosis not present

## 2022-04-07 DIAGNOSIS — D631 Anemia in chronic kidney disease: Secondary | ICD-10-CM | POA: Diagnosis not present

## 2022-04-08 DIAGNOSIS — D631 Anemia in chronic kidney disease: Secondary | ICD-10-CM | POA: Diagnosis not present

## 2022-04-08 DIAGNOSIS — N2581 Secondary hyperparathyroidism of renal origin: Secondary | ICD-10-CM | POA: Diagnosis not present

## 2022-04-08 DIAGNOSIS — N186 End stage renal disease: Secondary | ICD-10-CM | POA: Diagnosis not present

## 2022-04-09 DIAGNOSIS — N186 End stage renal disease: Secondary | ICD-10-CM | POA: Diagnosis not present

## 2022-04-09 DIAGNOSIS — D631 Anemia in chronic kidney disease: Secondary | ICD-10-CM | POA: Diagnosis not present

## 2022-04-09 DIAGNOSIS — N2581 Secondary hyperparathyroidism of renal origin: Secondary | ICD-10-CM | POA: Diagnosis not present

## 2022-04-10 DIAGNOSIS — N186 End stage renal disease: Secondary | ICD-10-CM | POA: Diagnosis not present

## 2022-04-10 DIAGNOSIS — D631 Anemia in chronic kidney disease: Secondary | ICD-10-CM | POA: Diagnosis not present

## 2022-04-10 DIAGNOSIS — N2581 Secondary hyperparathyroidism of renal origin: Secondary | ICD-10-CM | POA: Diagnosis not present

## 2022-04-11 DIAGNOSIS — N186 End stage renal disease: Secondary | ICD-10-CM | POA: Diagnosis not present

## 2022-04-11 DIAGNOSIS — N2581 Secondary hyperparathyroidism of renal origin: Secondary | ICD-10-CM | POA: Diagnosis not present

## 2022-04-11 DIAGNOSIS — D631 Anemia in chronic kidney disease: Secondary | ICD-10-CM | POA: Diagnosis not present

## 2022-04-12 DIAGNOSIS — N186 End stage renal disease: Secondary | ICD-10-CM | POA: Diagnosis not present

## 2022-04-12 DIAGNOSIS — N2581 Secondary hyperparathyroidism of renal origin: Secondary | ICD-10-CM | POA: Diagnosis not present

## 2022-04-12 DIAGNOSIS — D631 Anemia in chronic kidney disease: Secondary | ICD-10-CM | POA: Diagnosis not present

## 2022-04-13 DIAGNOSIS — D631 Anemia in chronic kidney disease: Secondary | ICD-10-CM | POA: Diagnosis not present

## 2022-04-13 DIAGNOSIS — N2581 Secondary hyperparathyroidism of renal origin: Secondary | ICD-10-CM | POA: Diagnosis not present

## 2022-04-13 DIAGNOSIS — N186 End stage renal disease: Secondary | ICD-10-CM | POA: Diagnosis not present

## 2022-04-14 DIAGNOSIS — N186 End stage renal disease: Secondary | ICD-10-CM | POA: Diagnosis not present

## 2022-04-14 DIAGNOSIS — N2581 Secondary hyperparathyroidism of renal origin: Secondary | ICD-10-CM | POA: Diagnosis not present

## 2022-04-14 DIAGNOSIS — D631 Anemia in chronic kidney disease: Secondary | ICD-10-CM | POA: Diagnosis not present

## 2022-04-15 DIAGNOSIS — N186 End stage renal disease: Secondary | ICD-10-CM | POA: Diagnosis not present

## 2022-04-15 DIAGNOSIS — N2581 Secondary hyperparathyroidism of renal origin: Secondary | ICD-10-CM | POA: Diagnosis not present

## 2022-04-15 DIAGNOSIS — D631 Anemia in chronic kidney disease: Secondary | ICD-10-CM | POA: Diagnosis not present

## 2022-04-16 DIAGNOSIS — D631 Anemia in chronic kidney disease: Secondary | ICD-10-CM | POA: Diagnosis not present

## 2022-04-16 DIAGNOSIS — N186 End stage renal disease: Secondary | ICD-10-CM | POA: Diagnosis not present

## 2022-04-16 DIAGNOSIS — N2581 Secondary hyperparathyroidism of renal origin: Secondary | ICD-10-CM | POA: Diagnosis not present

## 2022-04-17 DIAGNOSIS — D631 Anemia in chronic kidney disease: Secondary | ICD-10-CM | POA: Diagnosis not present

## 2022-04-17 DIAGNOSIS — N2581 Secondary hyperparathyroidism of renal origin: Secondary | ICD-10-CM | POA: Diagnosis not present

## 2022-04-17 DIAGNOSIS — N186 End stage renal disease: Secondary | ICD-10-CM | POA: Diagnosis not present

## 2022-04-18 DIAGNOSIS — N2581 Secondary hyperparathyroidism of renal origin: Secondary | ICD-10-CM | POA: Diagnosis not present

## 2022-04-18 DIAGNOSIS — D631 Anemia in chronic kidney disease: Secondary | ICD-10-CM | POA: Diagnosis not present

## 2022-04-18 DIAGNOSIS — N186 End stage renal disease: Secondary | ICD-10-CM | POA: Diagnosis not present

## 2022-04-19 DIAGNOSIS — N2581 Secondary hyperparathyroidism of renal origin: Secondary | ICD-10-CM | POA: Diagnosis not present

## 2022-04-19 DIAGNOSIS — N186 End stage renal disease: Secondary | ICD-10-CM | POA: Diagnosis not present

## 2022-04-19 DIAGNOSIS — D631 Anemia in chronic kidney disease: Secondary | ICD-10-CM | POA: Diagnosis not present

## 2022-04-20 DIAGNOSIS — N186 End stage renal disease: Secondary | ICD-10-CM | POA: Diagnosis not present

## 2022-04-20 DIAGNOSIS — N2581 Secondary hyperparathyroidism of renal origin: Secondary | ICD-10-CM | POA: Diagnosis not present

## 2022-04-20 DIAGNOSIS — D631 Anemia in chronic kidney disease: Secondary | ICD-10-CM | POA: Diagnosis not present

## 2022-04-21 DIAGNOSIS — N186 End stage renal disease: Secondary | ICD-10-CM | POA: Diagnosis not present

## 2022-04-21 DIAGNOSIS — N2581 Secondary hyperparathyroidism of renal origin: Secondary | ICD-10-CM | POA: Diagnosis not present

## 2022-04-21 DIAGNOSIS — D631 Anemia in chronic kidney disease: Secondary | ICD-10-CM | POA: Diagnosis not present

## 2022-04-22 DIAGNOSIS — N2581 Secondary hyperparathyroidism of renal origin: Secondary | ICD-10-CM | POA: Diagnosis not present

## 2022-04-22 DIAGNOSIS — D631 Anemia in chronic kidney disease: Secondary | ICD-10-CM | POA: Diagnosis not present

## 2022-04-22 DIAGNOSIS — N186 End stage renal disease: Secondary | ICD-10-CM | POA: Diagnosis not present

## 2022-04-23 DIAGNOSIS — N2581 Secondary hyperparathyroidism of renal origin: Secondary | ICD-10-CM | POA: Diagnosis not present

## 2022-04-23 DIAGNOSIS — N186 End stage renal disease: Secondary | ICD-10-CM | POA: Diagnosis not present

## 2022-04-23 DIAGNOSIS — D631 Anemia in chronic kidney disease: Secondary | ICD-10-CM | POA: Diagnosis not present

## 2022-04-24 DIAGNOSIS — D631 Anemia in chronic kidney disease: Secondary | ICD-10-CM | POA: Diagnosis not present

## 2022-04-24 DIAGNOSIS — N186 End stage renal disease: Secondary | ICD-10-CM | POA: Diagnosis not present

## 2022-04-24 DIAGNOSIS — N2581 Secondary hyperparathyroidism of renal origin: Secondary | ICD-10-CM | POA: Diagnosis not present

## 2022-04-25 DIAGNOSIS — D631 Anemia in chronic kidney disease: Secondary | ICD-10-CM | POA: Diagnosis not present

## 2022-04-25 DIAGNOSIS — N186 End stage renal disease: Secondary | ICD-10-CM | POA: Diagnosis not present

## 2022-04-25 DIAGNOSIS — N2581 Secondary hyperparathyroidism of renal origin: Secondary | ICD-10-CM | POA: Diagnosis not present

## 2022-04-25 DIAGNOSIS — Z992 Dependence on renal dialysis: Secondary | ICD-10-CM | POA: Diagnosis not present

## 2022-04-26 DIAGNOSIS — D631 Anemia in chronic kidney disease: Secondary | ICD-10-CM | POA: Diagnosis not present

## 2022-04-26 DIAGNOSIS — N186 End stage renal disease: Secondary | ICD-10-CM | POA: Diagnosis not present

## 2022-04-26 DIAGNOSIS — N2581 Secondary hyperparathyroidism of renal origin: Secondary | ICD-10-CM | POA: Diagnosis not present

## 2022-04-27 DIAGNOSIS — N186 End stage renal disease: Secondary | ICD-10-CM | POA: Diagnosis not present

## 2022-04-27 DIAGNOSIS — N2581 Secondary hyperparathyroidism of renal origin: Secondary | ICD-10-CM | POA: Diagnosis not present

## 2022-04-27 DIAGNOSIS — D631 Anemia in chronic kidney disease: Secondary | ICD-10-CM | POA: Diagnosis not present

## 2022-04-27 DIAGNOSIS — E119 Type 2 diabetes mellitus without complications: Secondary | ICD-10-CM | POA: Diagnosis not present

## 2022-04-28 DIAGNOSIS — N2581 Secondary hyperparathyroidism of renal origin: Secondary | ICD-10-CM | POA: Diagnosis not present

## 2022-04-28 DIAGNOSIS — D631 Anemia in chronic kidney disease: Secondary | ICD-10-CM | POA: Diagnosis not present

## 2022-04-28 DIAGNOSIS — N186 End stage renal disease: Secondary | ICD-10-CM | POA: Diagnosis not present

## 2022-04-29 DIAGNOSIS — D631 Anemia in chronic kidney disease: Secondary | ICD-10-CM | POA: Diagnosis not present

## 2022-04-29 DIAGNOSIS — N186 End stage renal disease: Secondary | ICD-10-CM | POA: Diagnosis not present

## 2022-04-29 DIAGNOSIS — N2581 Secondary hyperparathyroidism of renal origin: Secondary | ICD-10-CM | POA: Diagnosis not present

## 2022-04-30 DIAGNOSIS — D631 Anemia in chronic kidney disease: Secondary | ICD-10-CM | POA: Diagnosis not present

## 2022-04-30 DIAGNOSIS — N2581 Secondary hyperparathyroidism of renal origin: Secondary | ICD-10-CM | POA: Diagnosis not present

## 2022-04-30 DIAGNOSIS — N186 End stage renal disease: Secondary | ICD-10-CM | POA: Diagnosis not present

## 2022-05-01 DIAGNOSIS — N186 End stage renal disease: Secondary | ICD-10-CM | POA: Diagnosis not present

## 2022-05-01 DIAGNOSIS — N2581 Secondary hyperparathyroidism of renal origin: Secondary | ICD-10-CM | POA: Diagnosis not present

## 2022-05-01 DIAGNOSIS — D631 Anemia in chronic kidney disease: Secondary | ICD-10-CM | POA: Diagnosis not present

## 2022-05-02 DIAGNOSIS — J3489 Other specified disorders of nose and nasal sinuses: Secondary | ICD-10-CM | POA: Diagnosis not present

## 2022-05-02 DIAGNOSIS — T8144XA Sepsis following a procedure, initial encounter: Secondary | ICD-10-CM | POA: Diagnosis not present

## 2022-05-02 DIAGNOSIS — I959 Hypotension, unspecified: Secondary | ICD-10-CM | POA: Diagnosis not present

## 2022-05-02 DIAGNOSIS — I10 Essential (primary) hypertension: Secondary | ICD-10-CM | POA: Diagnosis not present

## 2022-05-02 DIAGNOSIS — T8571XA Infection and inflammatory reaction due to peritoneal dialysis catheter, initial encounter: Secondary | ICD-10-CM | POA: Insufficient documentation

## 2022-05-02 DIAGNOSIS — D631 Anemia in chronic kidney disease: Secondary | ICD-10-CM | POA: Diagnosis not present

## 2022-05-02 DIAGNOSIS — I82542 Chronic embolism and thrombosis of left tibial vein: Secondary | ICD-10-CM | POA: Diagnosis not present

## 2022-05-02 DIAGNOSIS — K658 Other peritonitis: Secondary | ICD-10-CM | POA: Diagnosis not present

## 2022-05-02 DIAGNOSIS — J9601 Acute respiratory failure with hypoxia: Secondary | ICD-10-CM | POA: Diagnosis not present

## 2022-05-02 DIAGNOSIS — N138 Other obstructive and reflux uropathy: Secondary | ICD-10-CM | POA: Diagnosis present

## 2022-05-02 DIAGNOSIS — A4189 Other specified sepsis: Secondary | ICD-10-CM | POA: Diagnosis not present

## 2022-05-02 DIAGNOSIS — R579 Shock, unspecified: Secondary | ICD-10-CM | POA: Diagnosis not present

## 2022-05-02 DIAGNOSIS — J9811 Atelectasis: Secondary | ICD-10-CM | POA: Diagnosis not present

## 2022-05-02 DIAGNOSIS — A419 Sepsis, unspecified organism: Secondary | ICD-10-CM | POA: Diagnosis not present

## 2022-05-02 DIAGNOSIS — R109 Unspecified abdominal pain: Secondary | ICD-10-CM | POA: Diagnosis not present

## 2022-05-02 DIAGNOSIS — I1311 Hypertensive heart and chronic kidney disease without heart failure, with stage 5 chronic kidney disease, or end stage renal disease: Secondary | ICD-10-CM | POA: Diagnosis not present

## 2022-05-02 DIAGNOSIS — R6520 Severe sepsis without septic shock: Secondary | ICD-10-CM | POA: Diagnosis not present

## 2022-05-02 DIAGNOSIS — T8112XA Postprocedural septic shock, initial encounter: Secondary | ICD-10-CM | POA: Diagnosis not present

## 2022-05-02 DIAGNOSIS — G9341 Metabolic encephalopathy: Secondary | ICD-10-CM | POA: Diagnosis not present

## 2022-05-02 DIAGNOSIS — I4510 Unspecified right bundle-branch block: Secondary | ICD-10-CM | POA: Diagnosis not present

## 2022-05-02 DIAGNOSIS — K631 Perforation of intestine (nontraumatic): Secondary | ICD-10-CM | POA: Diagnosis not present

## 2022-05-02 DIAGNOSIS — Z20822 Contact with and (suspected) exposure to covid-19: Secondary | ICD-10-CM | POA: Diagnosis present

## 2022-05-02 DIAGNOSIS — N2581 Secondary hyperparathyroidism of renal origin: Secondary | ICD-10-CM | POA: Diagnosis not present

## 2022-05-02 DIAGNOSIS — I1310 Hypertensive heart and chronic kidney disease without heart failure, with stage 1 through stage 4 chronic kidney disease, or unspecified chronic kidney disease: Secondary | ICD-10-CM | POA: Diagnosis not present

## 2022-05-02 DIAGNOSIS — I120 Hypertensive chronic kidney disease with stage 5 chronic kidney disease or end stage renal disease: Secondary | ICD-10-CM | POA: Diagnosis not present

## 2022-05-02 DIAGNOSIS — E1169 Type 2 diabetes mellitus with other specified complication: Secondary | ICD-10-CM | POA: Diagnosis not present

## 2022-05-02 DIAGNOSIS — R509 Fever, unspecified: Secondary | ICD-10-CM | POA: Diagnosis not present

## 2022-05-02 DIAGNOSIS — Q2112 Patent foramen ovale: Secondary | ICD-10-CM | POA: Diagnosis not present

## 2022-05-02 DIAGNOSIS — R9401 Abnormal electroencephalogram [EEG]: Secondary | ICD-10-CM | POA: Diagnosis not present

## 2022-05-02 DIAGNOSIS — Z992 Dependence on renal dialysis: Secondary | ICD-10-CM | POA: Diagnosis not present

## 2022-05-02 DIAGNOSIS — A4159 Other Gram-negative sepsis: Secondary | ICD-10-CM | POA: Diagnosis not present

## 2022-05-02 DIAGNOSIS — R918 Other nonspecific abnormal finding of lung field: Secondary | ICD-10-CM | POA: Diagnosis not present

## 2022-05-02 DIAGNOSIS — I452 Bifascicular block: Secondary | ICD-10-CM | POA: Diagnosis not present

## 2022-05-02 DIAGNOSIS — E861 Hypovolemia: Secondary | ICD-10-CM | POA: Diagnosis not present

## 2022-05-02 DIAGNOSIS — N189 Chronic kidney disease, unspecified: Secondary | ICD-10-CM | POA: Diagnosis not present

## 2022-05-02 DIAGNOSIS — R2232 Localized swelling, mass and lump, left upper limb: Secondary | ICD-10-CM | POA: Diagnosis not present

## 2022-05-02 DIAGNOSIS — I82442 Acute embolism and thrombosis of left tibial vein: Secondary | ICD-10-CM | POA: Diagnosis not present

## 2022-05-02 DIAGNOSIS — T8140XA Infection following a procedure, unspecified, initial encounter: Secondary | ICD-10-CM | POA: Diagnosis not present

## 2022-05-02 DIAGNOSIS — I82402 Acute embolism and thrombosis of unspecified deep veins of left lower extremity: Secondary | ICD-10-CM | POA: Diagnosis not present

## 2022-05-02 DIAGNOSIS — M79606 Pain in leg, unspecified: Secondary | ICD-10-CM | POA: Diagnosis not present

## 2022-05-02 DIAGNOSIS — K650 Generalized (acute) peritonitis: Secondary | ICD-10-CM | POA: Diagnosis not present

## 2022-05-02 DIAGNOSIS — R188 Other ascites: Secondary | ICD-10-CM | POA: Diagnosis not present

## 2022-05-02 DIAGNOSIS — I2510 Atherosclerotic heart disease of native coronary artery without angina pectoris: Secondary | ICD-10-CM | POA: Diagnosis not present

## 2022-05-02 DIAGNOSIS — E1122 Type 2 diabetes mellitus with diabetic chronic kidney disease: Secondary | ICD-10-CM | POA: Diagnosis present

## 2022-05-02 DIAGNOSIS — Z87891 Personal history of nicotine dependence: Secondary | ICD-10-CM | POA: Diagnosis not present

## 2022-05-02 DIAGNOSIS — B9620 Unspecified Escherichia coli [E. coli] as the cause of diseases classified elsewhere: Secondary | ICD-10-CM | POA: Diagnosis not present

## 2022-05-02 DIAGNOSIS — Z4682 Encounter for fitting and adjustment of non-vascular catheter: Secondary | ICD-10-CM | POA: Diagnosis not present

## 2022-05-02 DIAGNOSIS — E875 Hyperkalemia: Secondary | ICD-10-CM | POA: Diagnosis not present

## 2022-05-02 DIAGNOSIS — I471 Supraventricular tachycardia, unspecified: Secondary | ICD-10-CM | POA: Diagnosis not present

## 2022-05-02 DIAGNOSIS — E871 Hypo-osmolality and hyponatremia: Secondary | ICD-10-CM | POA: Diagnosis not present

## 2022-05-02 DIAGNOSIS — Z4659 Encounter for fitting and adjustment of other gastrointestinal appliance and device: Secondary | ICD-10-CM | POA: Diagnosis not present

## 2022-05-02 DIAGNOSIS — Z9889 Other specified postprocedural states: Secondary | ICD-10-CM | POA: Diagnosis not present

## 2022-05-02 DIAGNOSIS — I445 Left posterior fascicular block: Secondary | ICD-10-CM | POA: Diagnosis not present

## 2022-05-02 DIAGNOSIS — I5020 Unspecified systolic (congestive) heart failure: Secondary | ICD-10-CM | POA: Diagnosis not present

## 2022-05-02 DIAGNOSIS — Z794 Long term (current) use of insulin: Secondary | ICD-10-CM | POA: Diagnosis not present

## 2022-05-02 DIAGNOSIS — R4182 Altered mental status, unspecified: Secondary | ICD-10-CM | POA: Diagnosis not present

## 2022-05-02 DIAGNOSIS — I6389 Other cerebral infarction: Secondary | ICD-10-CM | POA: Diagnosis not present

## 2022-05-02 DIAGNOSIS — E119 Type 2 diabetes mellitus without complications: Secondary | ICD-10-CM | POA: Diagnosis not present

## 2022-05-02 DIAGNOSIS — R1110 Vomiting, unspecified: Secondary | ICD-10-CM | POA: Diagnosis not present

## 2022-05-02 DIAGNOSIS — K652 Spontaneous bacterial peritonitis: Secondary | ICD-10-CM | POA: Diagnosis present

## 2022-05-02 DIAGNOSIS — D72829 Elevated white blood cell count, unspecified: Secondary | ICD-10-CM | POA: Diagnosis not present

## 2022-05-02 DIAGNOSIS — R0902 Hypoxemia: Secondary | ICD-10-CM | POA: Diagnosis not present

## 2022-05-02 DIAGNOSIS — I82469 Acute embolism and thrombosis of unspecified calf muscular vein: Secondary | ICD-10-CM | POA: Diagnosis not present

## 2022-05-02 DIAGNOSIS — I491 Atrial premature depolarization: Secondary | ICD-10-CM | POA: Diagnosis not present

## 2022-05-02 DIAGNOSIS — R000 Tachycardia, unspecified: Secondary | ICD-10-CM | POA: Diagnosis not present

## 2022-05-02 DIAGNOSIS — I700 Atherosclerosis of aorta: Secondary | ICD-10-CM | POA: Diagnosis not present

## 2022-05-02 DIAGNOSIS — D649 Anemia, unspecified: Secondary | ICD-10-CM | POA: Diagnosis not present

## 2022-05-02 DIAGNOSIS — K5730 Diverticulosis of large intestine without perforation or abscess without bleeding: Secondary | ICD-10-CM | POA: Diagnosis not present

## 2022-05-02 DIAGNOSIS — K5720 Diverticulitis of large intestine with perforation and abscess without bleeding: Secondary | ICD-10-CM | POA: Diagnosis not present

## 2022-05-02 DIAGNOSIS — K659 Peritonitis, unspecified: Secondary | ICD-10-CM | POA: Diagnosis not present

## 2022-05-02 DIAGNOSIS — R1084 Generalized abdominal pain: Secondary | ICD-10-CM | POA: Diagnosis not present

## 2022-05-02 DIAGNOSIS — I953 Hypotension of hemodialysis: Secondary | ICD-10-CM | POA: Diagnosis not present

## 2022-05-02 DIAGNOSIS — J9602 Acute respiratory failure with hypercapnia: Secondary | ICD-10-CM | POA: Diagnosis not present

## 2022-05-02 DIAGNOSIS — E782 Mixed hyperlipidemia: Secondary | ICD-10-CM | POA: Diagnosis present

## 2022-05-02 DIAGNOSIS — Z9911 Dependence on respirator [ventilator] status: Secondary | ICD-10-CM | POA: Diagnosis not present

## 2022-05-02 DIAGNOSIS — I129 Hypertensive chronic kidney disease with stage 1 through stage 4 chronic kidney disease, or unspecified chronic kidney disease: Secondary | ICD-10-CM | POA: Diagnosis not present

## 2022-05-02 DIAGNOSIS — I639 Cerebral infarction, unspecified: Secondary | ICD-10-CM | POA: Diagnosis not present

## 2022-05-02 DIAGNOSIS — B9689 Other specified bacterial agents as the cause of diseases classified elsewhere: Secondary | ICD-10-CM | POA: Diagnosis not present

## 2022-05-02 DIAGNOSIS — J90 Pleural effusion, not elsewhere classified: Secondary | ICD-10-CM | POA: Diagnosis not present

## 2022-05-02 DIAGNOSIS — R6521 Severe sepsis with septic shock: Secondary | ICD-10-CM | POA: Diagnosis not present

## 2022-05-02 DIAGNOSIS — G928 Other toxic encephalopathy: Secondary | ICD-10-CM | POA: Diagnosis not present

## 2022-05-02 DIAGNOSIS — G9340 Encephalopathy, unspecified: Secondary | ICD-10-CM | POA: Diagnosis not present

## 2022-05-02 DIAGNOSIS — Z452 Encounter for adjustment and management of vascular access device: Secondary | ICD-10-CM | POA: Diagnosis not present

## 2022-05-02 DIAGNOSIS — K668 Other specified disorders of peritoneum: Secondary | ICD-10-CM | POA: Diagnosis not present

## 2022-05-02 DIAGNOSIS — K7460 Unspecified cirrhosis of liver: Secondary | ICD-10-CM | POA: Diagnosis present

## 2022-05-02 DIAGNOSIS — K5289 Other specified noninfective gastroenteritis and colitis: Secondary | ICD-10-CM | POA: Diagnosis not present

## 2022-05-02 DIAGNOSIS — Z781 Physical restraint status: Secondary | ICD-10-CM | POA: Diagnosis not present

## 2022-05-02 DIAGNOSIS — R0689 Other abnormalities of breathing: Secondary | ICD-10-CM | POA: Diagnosis not present

## 2022-05-02 DIAGNOSIS — N186 End stage renal disease: Secondary | ICD-10-CM | POA: Diagnosis present

## 2022-05-02 DIAGNOSIS — I132 Hypertensive heart and chronic kidney disease with heart failure and with stage 5 chronic kidney disease, or end stage renal disease: Secondary | ICD-10-CM | POA: Diagnosis not present

## 2022-05-02 DIAGNOSIS — Z4901 Encounter for fitting and adjustment of extracorporeal dialysis catheter: Secondary | ICD-10-CM | POA: Diagnosis not present

## 2022-05-02 DIAGNOSIS — N400 Enlarged prostate without lower urinary tract symptoms: Secondary | ICD-10-CM | POA: Diagnosis not present

## 2022-05-02 DIAGNOSIS — A4151 Sepsis due to Escherichia coli [E. coli]: Secondary | ICD-10-CM | POA: Diagnosis not present

## 2022-05-03 DIAGNOSIS — K668 Other specified disorders of peritoneum: Secondary | ICD-10-CM | POA: Diagnosis not present

## 2022-05-03 DIAGNOSIS — I445 Left posterior fascicular block: Secondary | ICD-10-CM | POA: Diagnosis not present

## 2022-05-03 DIAGNOSIS — J9811 Atelectasis: Secondary | ICD-10-CM | POA: Diagnosis not present

## 2022-05-03 DIAGNOSIS — K658 Other peritonitis: Secondary | ICD-10-CM | POA: Diagnosis not present

## 2022-05-03 DIAGNOSIS — I6389 Other cerebral infarction: Secondary | ICD-10-CM | POA: Diagnosis not present

## 2022-05-03 DIAGNOSIS — I639 Cerebral infarction, unspecified: Secondary | ICD-10-CM | POA: Diagnosis not present

## 2022-05-03 DIAGNOSIS — J9601 Acute respiratory failure with hypoxia: Secondary | ICD-10-CM | POA: Diagnosis not present

## 2022-05-03 DIAGNOSIS — J9602 Acute respiratory failure with hypercapnia: Secondary | ICD-10-CM | POA: Diagnosis not present

## 2022-05-03 DIAGNOSIS — K631 Perforation of intestine (nontraumatic): Secondary | ICD-10-CM | POA: Diagnosis not present

## 2022-05-03 DIAGNOSIS — R4182 Altered mental status, unspecified: Secondary | ICD-10-CM | POA: Diagnosis not present

## 2022-05-03 DIAGNOSIS — N138 Other obstructive and reflux uropathy: Secondary | ICD-10-CM | POA: Diagnosis present

## 2022-05-03 DIAGNOSIS — J690 Pneumonitis due to inhalation of food and vomit: Secondary | ICD-10-CM | POA: Diagnosis not present

## 2022-05-03 DIAGNOSIS — R0689 Other abnormalities of breathing: Secondary | ICD-10-CM | POA: Diagnosis not present

## 2022-05-03 DIAGNOSIS — Z4901 Encounter for fitting and adjustment of extracorporeal dialysis catheter: Secondary | ICD-10-CM | POA: Diagnosis not present

## 2022-05-03 DIAGNOSIS — N179 Acute kidney failure, unspecified: Secondary | ICD-10-CM | POA: Diagnosis not present

## 2022-05-03 DIAGNOSIS — K659 Peritonitis, unspecified: Secondary | ICD-10-CM | POA: Diagnosis not present

## 2022-05-03 DIAGNOSIS — R5381 Other malaise: Secondary | ICD-10-CM | POA: Diagnosis not present

## 2022-05-03 DIAGNOSIS — R410 Disorientation, unspecified: Secondary | ICD-10-CM | POA: Diagnosis not present

## 2022-05-03 DIAGNOSIS — Q2112 Patent foramen ovale: Secondary | ICD-10-CM | POA: Diagnosis not present

## 2022-05-03 DIAGNOSIS — A4159 Other Gram-negative sepsis: Secondary | ICD-10-CM | POA: Diagnosis not present

## 2022-05-03 DIAGNOSIS — R6520 Severe sepsis without septic shock: Secondary | ICD-10-CM | POA: Diagnosis not present

## 2022-05-03 DIAGNOSIS — T8571XA Infection and inflammatory reaction due to peritoneal dialysis catheter, initial encounter: Secondary | ICD-10-CM | POA: Diagnosis present

## 2022-05-03 DIAGNOSIS — N400 Enlarged prostate without lower urinary tract symptoms: Secondary | ICD-10-CM | POA: Diagnosis not present

## 2022-05-03 DIAGNOSIS — E119 Type 2 diabetes mellitus without complications: Secondary | ICD-10-CM | POA: Diagnosis not present

## 2022-05-03 DIAGNOSIS — B9689 Other specified bacterial agents as the cause of diseases classified elsewhere: Secondary | ICD-10-CM | POA: Diagnosis not present

## 2022-05-03 DIAGNOSIS — R109 Unspecified abdominal pain: Secondary | ICD-10-CM | POA: Diagnosis not present

## 2022-05-03 DIAGNOSIS — Z4682 Encounter for fitting and adjustment of non-vascular catheter: Secondary | ICD-10-CM | POA: Diagnosis not present

## 2022-05-03 DIAGNOSIS — A419 Sepsis, unspecified organism: Secondary | ICD-10-CM | POA: Diagnosis not present

## 2022-05-03 DIAGNOSIS — I82442 Acute embolism and thrombosis of left tibial vein: Secondary | ICD-10-CM | POA: Diagnosis not present

## 2022-05-03 DIAGNOSIS — Z4659 Encounter for fitting and adjustment of other gastrointestinal appliance and device: Secondary | ICD-10-CM | POA: Diagnosis not present

## 2022-05-03 DIAGNOSIS — E861 Hypovolemia: Secondary | ICD-10-CM | POA: Diagnosis not present

## 2022-05-03 DIAGNOSIS — N189 Chronic kidney disease, unspecified: Secondary | ICD-10-CM | POA: Diagnosis not present

## 2022-05-03 DIAGNOSIS — Z781 Physical restraint status: Secondary | ICD-10-CM | POA: Diagnosis not present

## 2022-05-03 DIAGNOSIS — I4510 Unspecified right bundle-branch block: Secondary | ICD-10-CM | POA: Diagnosis not present

## 2022-05-03 DIAGNOSIS — R509 Fever, unspecified: Secondary | ICD-10-CM | POA: Diagnosis not present

## 2022-05-03 DIAGNOSIS — N186 End stage renal disease: Secondary | ICD-10-CM | POA: Diagnosis not present

## 2022-05-03 DIAGNOSIS — G9341 Metabolic encephalopathy: Secondary | ICD-10-CM | POA: Diagnosis not present

## 2022-05-03 DIAGNOSIS — K7460 Unspecified cirrhosis of liver: Secondary | ICD-10-CM | POA: Diagnosis present

## 2022-05-03 DIAGNOSIS — D72829 Elevated white blood cell count, unspecified: Secondary | ICD-10-CM | POA: Diagnosis not present

## 2022-05-03 DIAGNOSIS — J3489 Other specified disorders of nose and nasal sinuses: Secondary | ICD-10-CM | POA: Diagnosis not present

## 2022-05-03 DIAGNOSIS — D649 Anemia, unspecified: Secondary | ICD-10-CM | POA: Diagnosis not present

## 2022-05-03 DIAGNOSIS — K5720 Diverticulitis of large intestine with perforation and abscess without bleeding: Secondary | ICD-10-CM | POA: Diagnosis not present

## 2022-05-03 DIAGNOSIS — E1122 Type 2 diabetes mellitus with diabetic chronic kidney disease: Secondary | ICD-10-CM | POA: Diagnosis present

## 2022-05-03 DIAGNOSIS — K650 Generalized (acute) peritonitis: Secondary | ICD-10-CM | POA: Diagnosis not present

## 2022-05-03 DIAGNOSIS — T8140XA Infection following a procedure, unspecified, initial encounter: Secondary | ICD-10-CM | POA: Diagnosis not present

## 2022-05-03 DIAGNOSIS — I1310 Hypertensive heart and chronic kidney disease without heart failure, with stage 1 through stage 4 chronic kidney disease, or unspecified chronic kidney disease: Secondary | ICD-10-CM | POA: Diagnosis not present

## 2022-05-03 DIAGNOSIS — Z794 Long term (current) use of insulin: Secondary | ICD-10-CM | POA: Diagnosis not present

## 2022-05-03 DIAGNOSIS — T8144XA Sepsis following a procedure, initial encounter: Secondary | ICD-10-CM | POA: Diagnosis not present

## 2022-05-03 DIAGNOSIS — R9401 Abnormal electroencephalogram [EEG]: Secondary | ICD-10-CM | POA: Diagnosis not present

## 2022-05-03 DIAGNOSIS — I120 Hypertensive chronic kidney disease with stage 5 chronic kidney disease or end stage renal disease: Secondary | ICD-10-CM | POA: Diagnosis present

## 2022-05-03 DIAGNOSIS — J90 Pleural effusion, not elsewhere classified: Secondary | ICD-10-CM | POA: Diagnosis not present

## 2022-05-03 DIAGNOSIS — R2232 Localized swelling, mass and lump, left upper limb: Secondary | ICD-10-CM | POA: Diagnosis not present

## 2022-05-03 DIAGNOSIS — Z8673 Personal history of transient ischemic attack (TIA), and cerebral infarction without residual deficits: Secondary | ICD-10-CM | POA: Diagnosis not present

## 2022-05-03 DIAGNOSIS — K5289 Other specified noninfective gastroenteritis and colitis: Secondary | ICD-10-CM | POA: Diagnosis not present

## 2022-05-03 DIAGNOSIS — R918 Other nonspecific abnormal finding of lung field: Secondary | ICD-10-CM | POA: Diagnosis not present

## 2022-05-03 DIAGNOSIS — I82469 Acute embolism and thrombosis of unspecified calf muscular vein: Secondary | ICD-10-CM | POA: Diagnosis not present

## 2022-05-03 DIAGNOSIS — R579 Shock, unspecified: Secondary | ICD-10-CM | POA: Diagnosis not present

## 2022-05-03 DIAGNOSIS — A4189 Other specified sepsis: Secondary | ICD-10-CM | POA: Diagnosis not present

## 2022-05-03 DIAGNOSIS — D631 Anemia in chronic kidney disease: Secondary | ICD-10-CM | POA: Diagnosis not present

## 2022-05-03 DIAGNOSIS — E1169 Type 2 diabetes mellitus with other specified complication: Secondary | ICD-10-CM | POA: Diagnosis not present

## 2022-05-03 DIAGNOSIS — G9340 Encephalopathy, unspecified: Secondary | ICD-10-CM | POA: Diagnosis not present

## 2022-05-03 DIAGNOSIS — N19 Unspecified kidney failure: Secondary | ICD-10-CM | POA: Diagnosis not present

## 2022-05-03 DIAGNOSIS — I491 Atrial premature depolarization: Secondary | ICD-10-CM | POA: Diagnosis not present

## 2022-05-03 DIAGNOSIS — K652 Spontaneous bacterial peritonitis: Secondary | ICD-10-CM | POA: Diagnosis present

## 2022-05-03 DIAGNOSIS — R0902 Hypoxemia: Secondary | ICD-10-CM | POA: Diagnosis not present

## 2022-05-03 DIAGNOSIS — T8112XA Postprocedural septic shock, initial encounter: Secondary | ICD-10-CM | POA: Diagnosis not present

## 2022-05-03 DIAGNOSIS — R1084 Generalized abdominal pain: Secondary | ICD-10-CM | POA: Diagnosis not present

## 2022-05-03 DIAGNOSIS — R188 Other ascites: Secondary | ICD-10-CM | POA: Diagnosis not present

## 2022-05-03 DIAGNOSIS — E871 Hypo-osmolality and hyponatremia: Secondary | ICD-10-CM | POA: Diagnosis not present

## 2022-05-03 DIAGNOSIS — J9621 Acute and chronic respiratory failure with hypoxia: Secondary | ICD-10-CM | POA: Diagnosis not present

## 2022-05-03 DIAGNOSIS — I471 Supraventricular tachycardia, unspecified: Secondary | ICD-10-CM | POA: Diagnosis not present

## 2022-05-03 DIAGNOSIS — R6521 Severe sepsis with septic shock: Secondary | ICD-10-CM | POA: Diagnosis not present

## 2022-05-03 DIAGNOSIS — K5730 Diverticulosis of large intestine without perforation or abscess without bleeding: Secondary | ICD-10-CM | POA: Diagnosis not present

## 2022-05-03 DIAGNOSIS — I2510 Atherosclerotic heart disease of native coronary artery without angina pectoris: Secondary | ICD-10-CM | POA: Diagnosis not present

## 2022-05-03 DIAGNOSIS — E875 Hyperkalemia: Secondary | ICD-10-CM | POA: Diagnosis not present

## 2022-05-03 DIAGNOSIS — I5020 Unspecified systolic (congestive) heart failure: Secondary | ICD-10-CM | POA: Diagnosis not present

## 2022-05-03 DIAGNOSIS — I82402 Acute embolism and thrombosis of unspecified deep veins of left lower extremity: Secondary | ICD-10-CM | POA: Diagnosis not present

## 2022-05-03 DIAGNOSIS — I82542 Chronic embolism and thrombosis of left tibial vein: Secondary | ICD-10-CM | POA: Diagnosis not present

## 2022-05-03 DIAGNOSIS — I700 Atherosclerosis of aorta: Secondary | ICD-10-CM | POA: Diagnosis not present

## 2022-05-03 DIAGNOSIS — G928 Other toxic encephalopathy: Secondary | ICD-10-CM | POA: Diagnosis not present

## 2022-05-03 DIAGNOSIS — R1110 Vomiting, unspecified: Secondary | ICD-10-CM | POA: Diagnosis not present

## 2022-05-03 DIAGNOSIS — I132 Hypertensive heart and chronic kidney disease with heart failure and with stage 5 chronic kidney disease, or end stage renal disease: Secondary | ICD-10-CM | POA: Diagnosis not present

## 2022-05-03 DIAGNOSIS — Z9911 Dependence on respirator [ventilator] status: Secondary | ICD-10-CM | POA: Diagnosis not present

## 2022-05-03 DIAGNOSIS — I959 Hypotension, unspecified: Secondary | ICD-10-CM | POA: Diagnosis not present

## 2022-05-03 DIAGNOSIS — Z87891 Personal history of nicotine dependence: Secondary | ICD-10-CM | POA: Diagnosis not present

## 2022-05-03 DIAGNOSIS — Z743 Need for continuous supervision: Secondary | ICD-10-CM | POA: Diagnosis not present

## 2022-05-03 DIAGNOSIS — Z9889 Other specified postprocedural states: Secondary | ICD-10-CM | POA: Diagnosis not present

## 2022-05-03 DIAGNOSIS — Z992 Dependence on renal dialysis: Secondary | ICD-10-CM | POA: Diagnosis not present

## 2022-05-03 DIAGNOSIS — M79606 Pain in leg, unspecified: Secondary | ICD-10-CM | POA: Diagnosis not present

## 2022-05-03 DIAGNOSIS — R000 Tachycardia, unspecified: Secondary | ICD-10-CM | POA: Diagnosis not present

## 2022-05-03 DIAGNOSIS — A4151 Sepsis due to Escherichia coli [E. coli]: Secondary | ICD-10-CM | POA: Diagnosis not present

## 2022-05-03 DIAGNOSIS — I953 Hypotension of hemodialysis: Secondary | ICD-10-CM | POA: Diagnosis not present

## 2022-05-03 DIAGNOSIS — Z452 Encounter for adjustment and management of vascular access device: Secondary | ICD-10-CM | POA: Diagnosis not present

## 2022-05-03 DIAGNOSIS — R404 Transient alteration of awareness: Secondary | ICD-10-CM | POA: Diagnosis not present

## 2022-05-03 DIAGNOSIS — E782 Mixed hyperlipidemia: Secondary | ICD-10-CM | POA: Diagnosis present

## 2022-05-03 DIAGNOSIS — I1311 Hypertensive heart and chronic kidney disease without heart failure, with stage 5 chronic kidney disease, or end stage renal disease: Secondary | ICD-10-CM | POA: Diagnosis not present

## 2022-05-03 DIAGNOSIS — Z20822 Contact with and (suspected) exposure to covid-19: Secondary | ICD-10-CM | POA: Diagnosis present

## 2022-05-03 DIAGNOSIS — I129 Hypertensive chronic kidney disease with stage 1 through stage 4 chronic kidney disease, or unspecified chronic kidney disease: Secondary | ICD-10-CM | POA: Diagnosis not present

## 2022-05-03 DIAGNOSIS — I452 Bifascicular block: Secondary | ICD-10-CM | POA: Diagnosis not present

## 2022-05-03 DIAGNOSIS — B9620 Unspecified Escherichia coli [E. coli] as the cause of diseases classified elsewhere: Secondary | ICD-10-CM | POA: Diagnosis not present

## 2022-05-03 DIAGNOSIS — I10 Essential (primary) hypertension: Secondary | ICD-10-CM | POA: Diagnosis not present

## 2022-05-04 DIAGNOSIS — E875 Hyperkalemia: Secondary | ICD-10-CM | POA: Diagnosis not present

## 2022-05-04 DIAGNOSIS — N186 End stage renal disease: Secondary | ICD-10-CM | POA: Diagnosis not present

## 2022-05-04 DIAGNOSIS — K650 Generalized (acute) peritonitis: Secondary | ICD-10-CM | POA: Diagnosis not present

## 2022-05-04 DIAGNOSIS — Z4682 Encounter for fitting and adjustment of non-vascular catheter: Secondary | ICD-10-CM | POA: Diagnosis not present

## 2022-05-04 DIAGNOSIS — B9689 Other specified bacterial agents as the cause of diseases classified elsewhere: Secondary | ICD-10-CM | POA: Diagnosis not present

## 2022-05-04 DIAGNOSIS — R6521 Severe sepsis with septic shock: Secondary | ICD-10-CM | POA: Insufficient documentation

## 2022-05-04 DIAGNOSIS — R1084 Generalized abdominal pain: Secondary | ICD-10-CM | POA: Diagnosis not present

## 2022-05-04 DIAGNOSIS — E1122 Type 2 diabetes mellitus with diabetic chronic kidney disease: Secondary | ICD-10-CM | POA: Diagnosis not present

## 2022-05-04 DIAGNOSIS — K658 Other peritonitis: Secondary | ICD-10-CM | POA: Diagnosis not present

## 2022-05-04 DIAGNOSIS — I4510 Unspecified right bundle-branch block: Secondary | ICD-10-CM | POA: Diagnosis not present

## 2022-05-04 DIAGNOSIS — I120 Hypertensive chronic kidney disease with stage 5 chronic kidney disease or end stage renal disease: Secondary | ICD-10-CM | POA: Diagnosis not present

## 2022-05-04 DIAGNOSIS — E871 Hypo-osmolality and hyponatremia: Secondary | ICD-10-CM | POA: Diagnosis not present

## 2022-05-04 DIAGNOSIS — I491 Atrial premature depolarization: Secondary | ICD-10-CM | POA: Diagnosis not present

## 2022-05-04 DIAGNOSIS — I10 Essential (primary) hypertension: Secondary | ICD-10-CM | POA: Diagnosis not present

## 2022-05-04 DIAGNOSIS — D72829 Elevated white blood cell count, unspecified: Secondary | ICD-10-CM | POA: Diagnosis not present

## 2022-05-04 DIAGNOSIS — Z992 Dependence on renal dialysis: Secondary | ICD-10-CM | POA: Diagnosis not present

## 2022-05-04 DIAGNOSIS — K659 Peritonitis, unspecified: Secondary | ICD-10-CM | POA: Diagnosis not present

## 2022-05-04 DIAGNOSIS — R918 Other nonspecific abnormal finding of lung field: Secondary | ICD-10-CM | POA: Diagnosis not present

## 2022-05-04 DIAGNOSIS — R109 Unspecified abdominal pain: Secondary | ICD-10-CM | POA: Diagnosis not present

## 2022-05-05 DIAGNOSIS — G9341 Metabolic encephalopathy: Secondary | ICD-10-CM | POA: Diagnosis not present

## 2022-05-05 DIAGNOSIS — K659 Peritonitis, unspecified: Secondary | ICD-10-CM | POA: Diagnosis not present

## 2022-05-05 DIAGNOSIS — I120 Hypertensive chronic kidney disease with stage 5 chronic kidney disease or end stage renal disease: Secondary | ICD-10-CM | POA: Diagnosis not present

## 2022-05-05 DIAGNOSIS — J9602 Acute respiratory failure with hypercapnia: Secondary | ICD-10-CM | POA: Diagnosis not present

## 2022-05-05 DIAGNOSIS — E1122 Type 2 diabetes mellitus with diabetic chronic kidney disease: Secondary | ICD-10-CM | POA: Diagnosis not present

## 2022-05-05 DIAGNOSIS — J9601 Acute respiratory failure with hypoxia: Secondary | ICD-10-CM | POA: Diagnosis not present

## 2022-05-05 DIAGNOSIS — B9689 Other specified bacterial agents as the cause of diseases classified elsewhere: Secondary | ICD-10-CM | POA: Diagnosis not present

## 2022-05-05 DIAGNOSIS — A4159 Other Gram-negative sepsis: Secondary | ICD-10-CM | POA: Diagnosis not present

## 2022-05-05 DIAGNOSIS — N186 End stage renal disease: Secondary | ICD-10-CM | POA: Diagnosis not present

## 2022-05-05 DIAGNOSIS — Z992 Dependence on renal dialysis: Secondary | ICD-10-CM | POA: Diagnosis not present

## 2022-05-05 DIAGNOSIS — R6521 Severe sepsis with septic shock: Secondary | ICD-10-CM | POA: Diagnosis not present

## 2022-05-05 DIAGNOSIS — T8571XA Infection and inflammatory reaction due to peritoneal dialysis catheter, initial encounter: Secondary | ICD-10-CM | POA: Diagnosis not present

## 2022-05-06 DIAGNOSIS — A4189 Other specified sepsis: Secondary | ICD-10-CM | POA: Diagnosis not present

## 2022-05-06 DIAGNOSIS — K659 Peritonitis, unspecified: Secondary | ICD-10-CM | POA: Diagnosis not present

## 2022-05-06 DIAGNOSIS — N186 End stage renal disease: Secondary | ICD-10-CM | POA: Diagnosis not present

## 2022-05-06 DIAGNOSIS — J9602 Acute respiratory failure with hypercapnia: Secondary | ICD-10-CM | POA: Diagnosis not present

## 2022-05-06 DIAGNOSIS — R6521 Severe sepsis with septic shock: Secondary | ICD-10-CM | POA: Diagnosis not present

## 2022-05-06 DIAGNOSIS — I120 Hypertensive chronic kidney disease with stage 5 chronic kidney disease or end stage renal disease: Secondary | ICD-10-CM | POA: Diagnosis not present

## 2022-05-06 DIAGNOSIS — E1122 Type 2 diabetes mellitus with diabetic chronic kidney disease: Secondary | ICD-10-CM | POA: Diagnosis not present

## 2022-05-06 DIAGNOSIS — Z992 Dependence on renal dialysis: Secondary | ICD-10-CM | POA: Diagnosis not present

## 2022-05-06 DIAGNOSIS — B9620 Unspecified Escherichia coli [E. coli] as the cause of diseases classified elsewhere: Secondary | ICD-10-CM | POA: Diagnosis not present

## 2022-05-06 DIAGNOSIS — G9341 Metabolic encephalopathy: Secondary | ICD-10-CM | POA: Diagnosis not present

## 2022-05-06 DIAGNOSIS — J9601 Acute respiratory failure with hypoxia: Secondary | ICD-10-CM | POA: Diagnosis not present

## 2022-05-07 DIAGNOSIS — T8571XA Infection and inflammatory reaction due to peritoneal dialysis catheter, initial encounter: Secondary | ICD-10-CM | POA: Diagnosis not present

## 2022-05-07 DIAGNOSIS — K658 Other peritonitis: Secondary | ICD-10-CM | POA: Diagnosis not present

## 2022-05-07 DIAGNOSIS — J9601 Acute respiratory failure with hypoxia: Secondary | ICD-10-CM | POA: Diagnosis not present

## 2022-05-07 DIAGNOSIS — A4151 Sepsis due to Escherichia coli [E. coli]: Secondary | ICD-10-CM | POA: Diagnosis not present

## 2022-05-07 DIAGNOSIS — R6521 Severe sepsis with septic shock: Secondary | ICD-10-CM | POA: Diagnosis not present

## 2022-05-07 DIAGNOSIS — Z992 Dependence on renal dialysis: Secondary | ICD-10-CM | POA: Diagnosis not present

## 2022-05-07 DIAGNOSIS — E1122 Type 2 diabetes mellitus with diabetic chronic kidney disease: Secondary | ICD-10-CM | POA: Diagnosis not present

## 2022-05-07 DIAGNOSIS — Z794 Long term (current) use of insulin: Secondary | ICD-10-CM | POA: Diagnosis not present

## 2022-05-07 DIAGNOSIS — I5020 Unspecified systolic (congestive) heart failure: Secondary | ICD-10-CM | POA: Diagnosis not present

## 2022-05-07 DIAGNOSIS — N186 End stage renal disease: Secondary | ICD-10-CM | POA: Diagnosis not present

## 2022-05-07 DIAGNOSIS — J9602 Acute respiratory failure with hypercapnia: Secondary | ICD-10-CM | POA: Diagnosis not present

## 2022-05-07 DIAGNOSIS — I132 Hypertensive heart and chronic kidney disease with heart failure and with stage 5 chronic kidney disease, or end stage renal disease: Secondary | ICD-10-CM | POA: Diagnosis not present

## 2022-05-08 DIAGNOSIS — J9601 Acute respiratory failure with hypoxia: Secondary | ICD-10-CM | POA: Diagnosis not present

## 2022-05-08 DIAGNOSIS — R6521 Severe sepsis with septic shock: Secondary | ICD-10-CM | POA: Diagnosis not present

## 2022-05-08 DIAGNOSIS — I5020 Unspecified systolic (congestive) heart failure: Secondary | ICD-10-CM | POA: Diagnosis not present

## 2022-05-08 DIAGNOSIS — K658 Other peritonitis: Secondary | ICD-10-CM | POA: Diagnosis not present

## 2022-05-08 DIAGNOSIS — Z992 Dependence on renal dialysis: Secondary | ICD-10-CM | POA: Diagnosis not present

## 2022-05-08 DIAGNOSIS — N186 End stage renal disease: Secondary | ICD-10-CM | POA: Diagnosis not present

## 2022-05-08 DIAGNOSIS — I132 Hypertensive heart and chronic kidney disease with heart failure and with stage 5 chronic kidney disease, or end stage renal disease: Secondary | ICD-10-CM | POA: Diagnosis not present

## 2022-05-08 DIAGNOSIS — J9602 Acute respiratory failure with hypercapnia: Secondary | ICD-10-CM | POA: Diagnosis not present

## 2022-05-08 DIAGNOSIS — T8571XA Infection and inflammatory reaction due to peritoneal dialysis catheter, initial encounter: Secondary | ICD-10-CM | POA: Diagnosis not present

## 2022-05-08 DIAGNOSIS — Z794 Long term (current) use of insulin: Secondary | ICD-10-CM | POA: Diagnosis not present

## 2022-05-08 DIAGNOSIS — E1122 Type 2 diabetes mellitus with diabetic chronic kidney disease: Secondary | ICD-10-CM | POA: Diagnosis not present

## 2022-05-08 DIAGNOSIS — A4151 Sepsis due to Escherichia coli [E. coli]: Secondary | ICD-10-CM | POA: Diagnosis not present

## 2022-05-09 DIAGNOSIS — E1122 Type 2 diabetes mellitus with diabetic chronic kidney disease: Secondary | ICD-10-CM | POA: Diagnosis not present

## 2022-05-09 DIAGNOSIS — A4151 Sepsis due to Escherichia coli [E. coli]: Secondary | ICD-10-CM | POA: Diagnosis not present

## 2022-05-09 DIAGNOSIS — I5020 Unspecified systolic (congestive) heart failure: Secondary | ICD-10-CM | POA: Diagnosis not present

## 2022-05-09 DIAGNOSIS — R6521 Severe sepsis with septic shock: Secondary | ICD-10-CM | POA: Diagnosis not present

## 2022-05-09 DIAGNOSIS — T8571XA Infection and inflammatory reaction due to peritoneal dialysis catheter, initial encounter: Secondary | ICD-10-CM | POA: Diagnosis not present

## 2022-05-09 DIAGNOSIS — D631 Anemia in chronic kidney disease: Secondary | ICD-10-CM | POA: Diagnosis not present

## 2022-05-09 DIAGNOSIS — Z992 Dependence on renal dialysis: Secondary | ICD-10-CM | POA: Diagnosis not present

## 2022-05-09 DIAGNOSIS — N186 End stage renal disease: Secondary | ICD-10-CM | POA: Diagnosis not present

## 2022-05-09 DIAGNOSIS — I132 Hypertensive heart and chronic kidney disease with heart failure and with stage 5 chronic kidney disease, or end stage renal disease: Secondary | ICD-10-CM | POA: Diagnosis not present

## 2022-05-09 DIAGNOSIS — K658 Other peritonitis: Secondary | ICD-10-CM | POA: Diagnosis not present

## 2022-05-09 DIAGNOSIS — J9602 Acute respiratory failure with hypercapnia: Secondary | ICD-10-CM | POA: Diagnosis not present

## 2022-05-09 DIAGNOSIS — J9601 Acute respiratory failure with hypoxia: Secondary | ICD-10-CM | POA: Diagnosis not present

## 2022-05-10 DIAGNOSIS — R2232 Localized swelling, mass and lump, left upper limb: Secondary | ICD-10-CM | POA: Diagnosis not present

## 2022-05-10 DIAGNOSIS — J9601 Acute respiratory failure with hypoxia: Secondary | ICD-10-CM | POA: Diagnosis not present

## 2022-05-21 DIAGNOSIS — I639 Cerebral infarction, unspecified: Secondary | ICD-10-CM | POA: Insufficient documentation

## 2022-05-25 DIAGNOSIS — I959 Hypotension, unspecified: Secondary | ICD-10-CM | POA: Diagnosis not present

## 2022-05-25 DIAGNOSIS — Z95828 Presence of other vascular implants and grafts: Secondary | ICD-10-CM | POA: Diagnosis not present

## 2022-05-25 DIAGNOSIS — E119 Type 2 diabetes mellitus without complications: Secondary | ICD-10-CM | POA: Diagnosis not present

## 2022-05-25 DIAGNOSIS — Z741 Need for assistance with personal care: Secondary | ICD-10-CM | POA: Diagnosis not present

## 2022-05-25 DIAGNOSIS — F05 Delirium due to known physiological condition: Secondary | ICD-10-CM | POA: Diagnosis not present

## 2022-05-25 DIAGNOSIS — I10 Essential (primary) hypertension: Secondary | ICD-10-CM | POA: Diagnosis not present

## 2022-05-25 DIAGNOSIS — K631 Perforation of intestine (nontraumatic): Secondary | ICD-10-CM | POA: Diagnosis not present

## 2022-05-25 DIAGNOSIS — J988 Other specified respiratory disorders: Secondary | ICD-10-CM | POA: Diagnosis not present

## 2022-05-25 DIAGNOSIS — N19 Unspecified kidney failure: Secondary | ICD-10-CM | POA: Diagnosis not present

## 2022-05-25 DIAGNOSIS — R5381 Other malaise: Secondary | ICD-10-CM | POA: Diagnosis not present

## 2022-05-25 DIAGNOSIS — K658 Other peritonitis: Secondary | ICD-10-CM | POA: Diagnosis not present

## 2022-05-25 DIAGNOSIS — Z7409 Other reduced mobility: Secondary | ICD-10-CM | POA: Diagnosis not present

## 2022-05-25 DIAGNOSIS — G4733 Obstructive sleep apnea (adult) (pediatric): Secondary | ICD-10-CM | POA: Diagnosis not present

## 2022-05-25 DIAGNOSIS — R0689 Other abnormalities of breathing: Secondary | ICD-10-CM | POA: Diagnosis not present

## 2022-05-25 DIAGNOSIS — R2681 Unsteadiness on feet: Secondary | ICD-10-CM | POA: Diagnosis not present

## 2022-05-25 DIAGNOSIS — Z743 Need for continuous supervision: Secondary | ICD-10-CM | POA: Diagnosis not present

## 2022-05-25 DIAGNOSIS — J398 Other specified diseases of upper respiratory tract: Secondary | ICD-10-CM | POA: Diagnosis not present

## 2022-05-25 DIAGNOSIS — Z8673 Personal history of transient ischemic attack (TIA), and cerebral infarction without residual deficits: Secondary | ICD-10-CM | POA: Diagnosis not present

## 2022-05-25 DIAGNOSIS — J690 Pneumonitis due to inhalation of food and vomit: Secondary | ICD-10-CM | POA: Diagnosis not present

## 2022-05-25 DIAGNOSIS — R061 Stridor: Secondary | ICD-10-CM | POA: Diagnosis not present

## 2022-05-25 DIAGNOSIS — Z9981 Dependence on supplemental oxygen: Secondary | ICD-10-CM | POA: Diagnosis not present

## 2022-05-25 DIAGNOSIS — R109 Unspecified abdominal pain: Secondary | ICD-10-CM | POA: Diagnosis not present

## 2022-05-25 DIAGNOSIS — F419 Anxiety disorder, unspecified: Secondary | ICD-10-CM | POA: Diagnosis not present

## 2022-05-25 DIAGNOSIS — G9341 Metabolic encephalopathy: Secondary | ICD-10-CM | POA: Diagnosis not present

## 2022-05-25 DIAGNOSIS — J9811 Atelectasis: Secondary | ICD-10-CM | POA: Diagnosis not present

## 2022-05-25 DIAGNOSIS — R1312 Dysphagia, oropharyngeal phase: Secondary | ICD-10-CM | POA: Diagnosis not present

## 2022-05-25 DIAGNOSIS — R404 Transient alteration of awareness: Secondary | ICD-10-CM | POA: Diagnosis not present

## 2022-05-25 DIAGNOSIS — R911 Solitary pulmonary nodule: Secondary | ICD-10-CM | POA: Diagnosis not present

## 2022-05-25 DIAGNOSIS — K5792 Diverticulitis of intestine, part unspecified, without perforation or abscess without bleeding: Secondary | ICD-10-CM | POA: Diagnosis not present

## 2022-05-25 DIAGNOSIS — Z86718 Personal history of other venous thrombosis and embolism: Secondary | ICD-10-CM | POA: Diagnosis not present

## 2022-05-25 DIAGNOSIS — R410 Disorientation, unspecified: Secondary | ICD-10-CM | POA: Diagnosis not present

## 2022-05-25 DIAGNOSIS — R2689 Other abnormalities of gait and mobility: Secondary | ICD-10-CM | POA: Diagnosis not present

## 2022-05-25 DIAGNOSIS — E042 Nontoxic multinodular goiter: Secondary | ICD-10-CM | POA: Diagnosis not present

## 2022-05-25 DIAGNOSIS — J9601 Acute respiratory failure with hypoxia: Secondary | ICD-10-CM | POA: Diagnosis not present

## 2022-05-25 DIAGNOSIS — G9340 Encephalopathy, unspecified: Secondary | ICD-10-CM | POA: Diagnosis not present

## 2022-05-25 DIAGNOSIS — I69398 Other sequelae of cerebral infarction: Secondary | ICD-10-CM | POA: Diagnosis not present

## 2022-05-25 DIAGNOSIS — J9602 Acute respiratory failure with hypercapnia: Secondary | ICD-10-CM | POA: Diagnosis not present

## 2022-05-25 DIAGNOSIS — Z4901 Encounter for fitting and adjustment of extracorporeal dialysis catheter: Secondary | ICD-10-CM | POA: Diagnosis not present

## 2022-05-25 DIAGNOSIS — J9600 Acute respiratory failure, unspecified whether with hypoxia or hypercapnia: Secondary | ICD-10-CM | POA: Diagnosis not present

## 2022-05-25 DIAGNOSIS — D638 Anemia in other chronic diseases classified elsewhere: Secondary | ICD-10-CM | POA: Diagnosis not present

## 2022-05-25 DIAGNOSIS — M6281 Muscle weakness (generalized): Secondary | ICD-10-CM | POA: Diagnosis not present

## 2022-05-25 DIAGNOSIS — Z7401 Bed confinement status: Secondary | ICD-10-CM | POA: Diagnosis not present

## 2022-05-25 DIAGNOSIS — R0603 Acute respiratory distress: Secondary | ICD-10-CM | POA: Diagnosis not present

## 2022-05-25 DIAGNOSIS — J9611 Chronic respiratory failure with hypoxia: Secondary | ICD-10-CM | POA: Diagnosis not present

## 2022-05-25 DIAGNOSIS — E1122 Type 2 diabetes mellitus with diabetic chronic kidney disease: Secondary | ICD-10-CM | POA: Diagnosis not present

## 2022-05-25 DIAGNOSIS — J9621 Acute and chronic respiratory failure with hypoxia: Secondary | ICD-10-CM | POA: Diagnosis not present

## 2022-05-25 DIAGNOSIS — R4189 Other symptoms and signs involving cognitive functions and awareness: Secondary | ICD-10-CM | POA: Diagnosis not present

## 2022-05-25 DIAGNOSIS — N179 Acute kidney failure, unspecified: Secondary | ICD-10-CM | POA: Diagnosis not present

## 2022-05-25 DIAGNOSIS — R0989 Other specified symptoms and signs involving the circulatory and respiratory systems: Secondary | ICD-10-CM | POA: Diagnosis not present

## 2022-05-25 DIAGNOSIS — Z932 Ileostomy status: Secondary | ICD-10-CM | POA: Diagnosis not present

## 2022-05-25 DIAGNOSIS — Z432 Encounter for attention to ileostomy: Secondary | ICD-10-CM | POA: Diagnosis not present

## 2022-05-25 DIAGNOSIS — G894 Chronic pain syndrome: Secondary | ICD-10-CM | POA: Diagnosis not present

## 2022-05-25 DIAGNOSIS — M5416 Radiculopathy, lumbar region: Secondary | ICD-10-CM | POA: Diagnosis not present

## 2022-05-25 DIAGNOSIS — E785 Hyperlipidemia, unspecified: Secondary | ICD-10-CM | POA: Diagnosis not present

## 2022-05-25 DIAGNOSIS — I6389 Other cerebral infarction: Secondary | ICD-10-CM | POA: Diagnosis not present

## 2022-05-25 DIAGNOSIS — A4151 Sepsis due to Escherichia coli [E. coli]: Secondary | ICD-10-CM | POA: Diagnosis not present

## 2022-05-25 DIAGNOSIS — Z992 Dependence on renal dialysis: Secondary | ICD-10-CM | POA: Diagnosis not present

## 2022-05-25 DIAGNOSIS — N186 End stage renal disease: Secondary | ICD-10-CM | POA: Diagnosis not present

## 2022-05-25 DIAGNOSIS — R1310 Dysphagia, unspecified: Secondary | ICD-10-CM | POA: Diagnosis not present

## 2022-05-25 DIAGNOSIS — I639 Cerebral infarction, unspecified: Secondary | ICD-10-CM | POA: Diagnosis not present

## 2022-05-25 DIAGNOSIS — F32A Depression, unspecified: Secondary | ICD-10-CM | POA: Diagnosis not present

## 2022-05-25 DIAGNOSIS — E1140 Type 2 diabetes mellitus with diabetic neuropathy, unspecified: Secondary | ICD-10-CM | POA: Diagnosis not present

## 2022-05-25 DIAGNOSIS — I69391 Dysphagia following cerebral infarction: Secondary | ICD-10-CM | POA: Diagnosis not present

## 2022-05-25 DIAGNOSIS — R6520 Severe sepsis without septic shock: Secondary | ICD-10-CM | POA: Diagnosis not present

## 2022-05-25 DIAGNOSIS — Z48815 Encounter for surgical aftercare following surgery on the digestive system: Secondary | ICD-10-CM | POA: Diagnosis not present

## 2022-05-25 DIAGNOSIS — D631 Anemia in chronic kidney disease: Secondary | ICD-10-CM | POA: Diagnosis not present

## 2022-05-25 DIAGNOSIS — I120 Hypertensive chronic kidney disease with stage 5 chronic kidney disease or end stage renal disease: Secondary | ICD-10-CM | POA: Diagnosis not present

## 2022-05-25 DIAGNOSIS — D649 Anemia, unspecified: Secondary | ICD-10-CM | POA: Diagnosis not present

## 2022-05-25 DIAGNOSIS — Z9911 Dependence on respirator [ventilator] status: Secondary | ICD-10-CM | POA: Diagnosis not present

## 2022-05-25 DIAGNOSIS — Z9889 Other specified postprocedural states: Secondary | ICD-10-CM | POA: Diagnosis not present

## 2022-05-25 DIAGNOSIS — R1311 Dysphagia, oral phase: Secondary | ICD-10-CM | POA: Diagnosis not present

## 2022-05-25 DIAGNOSIS — R042 Hemoptysis: Secondary | ICD-10-CM | POA: Diagnosis not present

## 2022-05-25 DIAGNOSIS — R000 Tachycardia, unspecified: Secondary | ICD-10-CM | POA: Diagnosis not present

## 2022-05-25 DIAGNOSIS — F411 Generalized anxiety disorder: Secondary | ICD-10-CM | POA: Diagnosis not present

## 2022-05-25 DIAGNOSIS — E871 Hypo-osmolality and hyponatremia: Secondary | ICD-10-CM | POA: Diagnosis not present

## 2022-05-25 DIAGNOSIS — Z4659 Encounter for fitting and adjustment of other gastrointestinal appliance and device: Secondary | ICD-10-CM | POA: Diagnosis not present

## 2022-05-26 DIAGNOSIS — J9621 Acute and chronic respiratory failure with hypoxia: Secondary | ICD-10-CM | POA: Diagnosis not present

## 2022-05-26 DIAGNOSIS — R911 Solitary pulmonary nodule: Secondary | ICD-10-CM | POA: Diagnosis not present

## 2022-05-26 DIAGNOSIS — Z8673 Personal history of transient ischemic attack (TIA), and cerebral infarction without residual deficits: Secondary | ICD-10-CM

## 2022-05-26 DIAGNOSIS — K631 Perforation of intestine (nontraumatic): Secondary | ICD-10-CM

## 2022-05-26 DIAGNOSIS — N186 End stage renal disease: Secondary | ICD-10-CM

## 2022-05-26 DIAGNOSIS — J9602 Acute respiratory failure with hypercapnia: Secondary | ICD-10-CM

## 2022-05-26 DIAGNOSIS — G9341 Metabolic encephalopathy: Secondary | ICD-10-CM

## 2022-05-26 DIAGNOSIS — R5381 Other malaise: Secondary | ICD-10-CM

## 2022-05-26 DIAGNOSIS — Z9911 Dependence on respirator [ventilator] status: Secondary | ICD-10-CM | POA: Diagnosis not present

## 2022-05-27 DIAGNOSIS — K631 Perforation of intestine (nontraumatic): Secondary | ICD-10-CM | POA: Diagnosis not present

## 2022-05-27 DIAGNOSIS — N186 End stage renal disease: Secondary | ICD-10-CM | POA: Diagnosis not present

## 2022-05-27 DIAGNOSIS — G9341 Metabolic encephalopathy: Secondary | ICD-10-CM | POA: Diagnosis not present

## 2022-05-27 DIAGNOSIS — J9602 Acute respiratory failure with hypercapnia: Secondary | ICD-10-CM | POA: Diagnosis not present

## 2022-05-28 DIAGNOSIS — Z992 Dependence on renal dialysis: Secondary | ICD-10-CM | POA: Diagnosis not present

## 2022-05-28 DIAGNOSIS — E042 Nontoxic multinodular goiter: Secondary | ICD-10-CM | POA: Diagnosis not present

## 2022-05-28 DIAGNOSIS — I120 Hypertensive chronic kidney disease with stage 5 chronic kidney disease or end stage renal disease: Secondary | ICD-10-CM | POA: Diagnosis not present

## 2022-05-28 DIAGNOSIS — J9602 Acute respiratory failure with hypercapnia: Secondary | ICD-10-CM

## 2022-05-28 DIAGNOSIS — J9611 Chronic respiratory failure with hypoxia: Secondary | ICD-10-CM | POA: Diagnosis not present

## 2022-05-28 DIAGNOSIS — D631 Anemia in chronic kidney disease: Secondary | ICD-10-CM | POA: Diagnosis not present

## 2022-05-28 DIAGNOSIS — I639 Cerebral infarction, unspecified: Secondary | ICD-10-CM | POA: Diagnosis not present

## 2022-05-28 DIAGNOSIS — R1311 Dysphagia, oral phase: Secondary | ICD-10-CM | POA: Diagnosis not present

## 2022-05-28 DIAGNOSIS — N186 End stage renal disease: Secondary | ICD-10-CM | POA: Diagnosis not present

## 2022-05-28 DIAGNOSIS — J9601 Acute respiratory failure with hypoxia: Secondary | ICD-10-CM | POA: Diagnosis not present

## 2022-05-28 DIAGNOSIS — R5381 Other malaise: Secondary | ICD-10-CM

## 2022-05-28 DIAGNOSIS — R0989 Other specified symptoms and signs involving the circulatory and respiratory systems: Secondary | ICD-10-CM | POA: Diagnosis not present

## 2022-05-28 DIAGNOSIS — Z8673 Personal history of transient ischemic attack (TIA), and cerebral infarction without residual deficits: Secondary | ICD-10-CM

## 2022-05-28 DIAGNOSIS — J9621 Acute and chronic respiratory failure with hypoxia: Secondary | ICD-10-CM | POA: Diagnosis not present

## 2022-05-28 DIAGNOSIS — R911 Solitary pulmonary nodule: Secondary | ICD-10-CM | POA: Diagnosis not present

## 2022-05-28 DIAGNOSIS — Z9911 Dependence on respirator [ventilator] status: Secondary | ICD-10-CM | POA: Diagnosis not present

## 2022-05-28 DIAGNOSIS — G9341 Metabolic encephalopathy: Secondary | ICD-10-CM

## 2022-05-28 DIAGNOSIS — K631 Perforation of intestine (nontraumatic): Secondary | ICD-10-CM

## 2022-05-29 DIAGNOSIS — I639 Cerebral infarction, unspecified: Secondary | ICD-10-CM | POA: Diagnosis not present

## 2022-05-29 DIAGNOSIS — R2689 Other abnormalities of gait and mobility: Secondary | ICD-10-CM | POA: Diagnosis not present

## 2022-05-29 DIAGNOSIS — R1311 Dysphagia, oral phase: Secondary | ICD-10-CM | POA: Diagnosis not present

## 2022-05-29 DIAGNOSIS — I120 Hypertensive chronic kidney disease with stage 5 chronic kidney disease or end stage renal disease: Secondary | ICD-10-CM | POA: Diagnosis not present

## 2022-05-29 DIAGNOSIS — R911 Solitary pulmonary nodule: Secondary | ICD-10-CM | POA: Diagnosis not present

## 2022-05-29 DIAGNOSIS — R5381 Other malaise: Secondary | ICD-10-CM | POA: Diagnosis not present

## 2022-05-29 DIAGNOSIS — E042 Nontoxic multinodular goiter: Secondary | ICD-10-CM | POA: Diagnosis not present

## 2022-05-29 DIAGNOSIS — N186 End stage renal disease: Secondary | ICD-10-CM | POA: Diagnosis not present

## 2022-05-29 DIAGNOSIS — I69398 Other sequelae of cerebral infarction: Secondary | ICD-10-CM | POA: Diagnosis not present

## 2022-05-29 DIAGNOSIS — D631 Anemia in chronic kidney disease: Secondary | ICD-10-CM | POA: Diagnosis not present

## 2022-05-29 DIAGNOSIS — J9621 Acute and chronic respiratory failure with hypoxia: Secondary | ICD-10-CM | POA: Diagnosis not present

## 2022-05-29 DIAGNOSIS — R4189 Other symptoms and signs involving cognitive functions and awareness: Secondary | ICD-10-CM | POA: Diagnosis not present

## 2022-05-29 DIAGNOSIS — Z9911 Dependence on respirator [ventilator] status: Secondary | ICD-10-CM | POA: Diagnosis not present

## 2022-05-29 DIAGNOSIS — Z992 Dependence on renal dialysis: Secondary | ICD-10-CM | POA: Diagnosis not present

## 2022-05-29 DIAGNOSIS — J9611 Chronic respiratory failure with hypoxia: Secondary | ICD-10-CM | POA: Diagnosis not present

## 2022-05-29 DIAGNOSIS — J9601 Acute respiratory failure with hypoxia: Secondary | ICD-10-CM | POA: Diagnosis not present

## 2022-05-29 DIAGNOSIS — G9341 Metabolic encephalopathy: Secondary | ICD-10-CM | POA: Diagnosis not present

## 2022-05-29 DIAGNOSIS — J9602 Acute respiratory failure with hypercapnia: Secondary | ICD-10-CM | POA: Diagnosis not present

## 2022-05-29 DIAGNOSIS — K631 Perforation of intestine (nontraumatic): Secondary | ICD-10-CM | POA: Diagnosis not present

## 2022-05-30 DIAGNOSIS — J9621 Acute and chronic respiratory failure with hypoxia: Secondary | ICD-10-CM | POA: Diagnosis not present

## 2022-05-30 DIAGNOSIS — N186 End stage renal disease: Secondary | ICD-10-CM | POA: Diagnosis not present

## 2022-05-30 DIAGNOSIS — D631 Anemia in chronic kidney disease: Secondary | ICD-10-CM | POA: Diagnosis not present

## 2022-05-30 DIAGNOSIS — J9601 Acute respiratory failure with hypoxia: Secondary | ICD-10-CM | POA: Diagnosis not present

## 2022-05-30 DIAGNOSIS — Z9911 Dependence on respirator [ventilator] status: Secondary | ICD-10-CM | POA: Diagnosis not present

## 2022-05-30 DIAGNOSIS — I120 Hypertensive chronic kidney disease with stage 5 chronic kidney disease or end stage renal disease: Secondary | ICD-10-CM | POA: Diagnosis not present

## 2022-05-30 DIAGNOSIS — R911 Solitary pulmonary nodule: Secondary | ICD-10-CM | POA: Diagnosis not present

## 2022-05-30 DIAGNOSIS — Z992 Dependence on renal dialysis: Secondary | ICD-10-CM | POA: Diagnosis not present

## 2022-05-30 DIAGNOSIS — I639 Cerebral infarction, unspecified: Secondary | ICD-10-CM | POA: Diagnosis not present

## 2022-05-30 DIAGNOSIS — G9341 Metabolic encephalopathy: Secondary | ICD-10-CM | POA: Diagnosis not present

## 2022-05-30 DIAGNOSIS — J9602 Acute respiratory failure with hypercapnia: Secondary | ICD-10-CM | POA: Diagnosis not present

## 2022-05-30 DIAGNOSIS — K631 Perforation of intestine (nontraumatic): Secondary | ICD-10-CM | POA: Diagnosis not present

## 2022-05-31 DIAGNOSIS — D631 Anemia in chronic kidney disease: Secondary | ICD-10-CM | POA: Diagnosis not present

## 2022-05-31 DIAGNOSIS — R1311 Dysphagia, oral phase: Secondary | ICD-10-CM | POA: Diagnosis not present

## 2022-05-31 DIAGNOSIS — I639 Cerebral infarction, unspecified: Secondary | ICD-10-CM | POA: Diagnosis not present

## 2022-05-31 DIAGNOSIS — I120 Hypertensive chronic kidney disease with stage 5 chronic kidney disease or end stage renal disease: Secondary | ICD-10-CM | POA: Diagnosis not present

## 2022-05-31 DIAGNOSIS — J9601 Acute respiratory failure with hypoxia: Secondary | ICD-10-CM | POA: Diagnosis not present

## 2022-05-31 DIAGNOSIS — J9621 Acute and chronic respiratory failure with hypoxia: Secondary | ICD-10-CM | POA: Diagnosis not present

## 2022-05-31 DIAGNOSIS — J9611 Chronic respiratory failure with hypoxia: Secondary | ICD-10-CM | POA: Diagnosis not present

## 2022-05-31 DIAGNOSIS — E042 Nontoxic multinodular goiter: Secondary | ICD-10-CM | POA: Diagnosis not present

## 2022-05-31 DIAGNOSIS — N186 End stage renal disease: Secondary | ICD-10-CM | POA: Diagnosis not present

## 2022-05-31 DIAGNOSIS — J690 Pneumonitis due to inhalation of food and vomit: Secondary | ICD-10-CM | POA: Diagnosis not present

## 2022-05-31 DIAGNOSIS — R6520 Severe sepsis without septic shock: Secondary | ICD-10-CM | POA: Diagnosis not present

## 2022-05-31 DIAGNOSIS — Z992 Dependence on renal dialysis: Secondary | ICD-10-CM | POA: Diagnosis not present

## 2022-05-31 DIAGNOSIS — G9341 Metabolic encephalopathy: Secondary | ICD-10-CM | POA: Diagnosis not present

## 2022-05-31 DIAGNOSIS — J9602 Acute respiratory failure with hypercapnia: Secondary | ICD-10-CM | POA: Diagnosis not present

## 2022-05-31 DIAGNOSIS — K631 Perforation of intestine (nontraumatic): Secondary | ICD-10-CM | POA: Diagnosis not present

## 2022-06-01 DIAGNOSIS — Z992 Dependence on renal dialysis: Secondary | ICD-10-CM | POA: Diagnosis not present

## 2022-06-01 DIAGNOSIS — R6520 Severe sepsis without septic shock: Secondary | ICD-10-CM | POA: Diagnosis not present

## 2022-06-01 DIAGNOSIS — J9621 Acute and chronic respiratory failure with hypoxia: Secondary | ICD-10-CM | POA: Diagnosis not present

## 2022-06-01 DIAGNOSIS — I639 Cerebral infarction, unspecified: Secondary | ICD-10-CM | POA: Diagnosis not present

## 2022-06-01 DIAGNOSIS — D631 Anemia in chronic kidney disease: Secondary | ICD-10-CM | POA: Diagnosis not present

## 2022-06-01 DIAGNOSIS — J690 Pneumonitis due to inhalation of food and vomit: Secondary | ICD-10-CM | POA: Diagnosis not present

## 2022-06-01 DIAGNOSIS — N186 End stage renal disease: Secondary | ICD-10-CM | POA: Diagnosis not present

## 2022-06-01 DIAGNOSIS — I120 Hypertensive chronic kidney disease with stage 5 chronic kidney disease or end stage renal disease: Secondary | ICD-10-CM | POA: Diagnosis not present

## 2022-06-01 DIAGNOSIS — J9601 Acute respiratory failure with hypoxia: Secondary | ICD-10-CM | POA: Diagnosis not present

## 2022-06-01 DIAGNOSIS — J9602 Acute respiratory failure with hypercapnia: Secondary | ICD-10-CM | POA: Diagnosis not present

## 2022-06-01 DIAGNOSIS — G9341 Metabolic encephalopathy: Secondary | ICD-10-CM | POA: Diagnosis not present

## 2022-06-01 DIAGNOSIS — K631 Perforation of intestine (nontraumatic): Secondary | ICD-10-CM | POA: Diagnosis not present

## 2022-06-02 DIAGNOSIS — N186 End stage renal disease: Secondary | ICD-10-CM | POA: Diagnosis not present

## 2022-06-02 DIAGNOSIS — J9621 Acute and chronic respiratory failure with hypoxia: Secondary | ICD-10-CM | POA: Diagnosis not present

## 2022-06-02 DIAGNOSIS — J690 Pneumonitis due to inhalation of food and vomit: Secondary | ICD-10-CM | POA: Diagnosis not present

## 2022-06-02 DIAGNOSIS — R6520 Severe sepsis without septic shock: Secondary | ICD-10-CM | POA: Diagnosis not present

## 2022-06-02 DIAGNOSIS — K631 Perforation of intestine (nontraumatic): Secondary | ICD-10-CM | POA: Diagnosis not present

## 2022-06-02 DIAGNOSIS — J9602 Acute respiratory failure with hypercapnia: Secondary | ICD-10-CM | POA: Diagnosis not present

## 2022-06-02 DIAGNOSIS — G9341 Metabolic encephalopathy: Secondary | ICD-10-CM | POA: Diagnosis not present

## 2022-06-03 DIAGNOSIS — J9621 Acute and chronic respiratory failure with hypoxia: Secondary | ICD-10-CM | POA: Diagnosis not present

## 2022-06-03 DIAGNOSIS — J690 Pneumonitis due to inhalation of food and vomit: Secondary | ICD-10-CM | POA: Diagnosis not present

## 2022-06-03 DIAGNOSIS — R6520 Severe sepsis without septic shock: Secondary | ICD-10-CM | POA: Diagnosis not present

## 2022-06-03 DIAGNOSIS — N186 End stage renal disease: Secondary | ICD-10-CM | POA: Diagnosis not present

## 2022-06-03 DIAGNOSIS — G9341 Metabolic encephalopathy: Secondary | ICD-10-CM | POA: Diagnosis not present

## 2022-06-03 DIAGNOSIS — J9602 Acute respiratory failure with hypercapnia: Secondary | ICD-10-CM | POA: Diagnosis not present

## 2022-06-03 DIAGNOSIS — K631 Perforation of intestine (nontraumatic): Secondary | ICD-10-CM | POA: Diagnosis not present

## 2022-06-04 DIAGNOSIS — R6520 Severe sepsis without septic shock: Secondary | ICD-10-CM | POA: Diagnosis not present

## 2022-06-04 DIAGNOSIS — D631 Anemia in chronic kidney disease: Secondary | ICD-10-CM | POA: Diagnosis not present

## 2022-06-04 DIAGNOSIS — R1311 Dysphagia, oral phase: Secondary | ICD-10-CM | POA: Diagnosis not present

## 2022-06-04 DIAGNOSIS — J9621 Acute and chronic respiratory failure with hypoxia: Secondary | ICD-10-CM | POA: Diagnosis not present

## 2022-06-04 DIAGNOSIS — I639 Cerebral infarction, unspecified: Secondary | ICD-10-CM | POA: Diagnosis not present

## 2022-06-04 DIAGNOSIS — E042 Nontoxic multinodular goiter: Secondary | ICD-10-CM | POA: Diagnosis not present

## 2022-06-04 DIAGNOSIS — Z992 Dependence on renal dialysis: Secondary | ICD-10-CM | POA: Diagnosis not present

## 2022-06-04 DIAGNOSIS — J690 Pneumonitis due to inhalation of food and vomit: Secondary | ICD-10-CM | POA: Diagnosis not present

## 2022-06-04 DIAGNOSIS — I120 Hypertensive chronic kidney disease with stage 5 chronic kidney disease or end stage renal disease: Secondary | ICD-10-CM | POA: Diagnosis not present

## 2022-06-04 DIAGNOSIS — J9602 Acute respiratory failure with hypercapnia: Secondary | ICD-10-CM

## 2022-06-04 DIAGNOSIS — J9611 Chronic respiratory failure with hypoxia: Secondary | ICD-10-CM | POA: Diagnosis not present

## 2022-06-04 DIAGNOSIS — K631 Perforation of intestine (nontraumatic): Secondary | ICD-10-CM

## 2022-06-04 DIAGNOSIS — G9341 Metabolic encephalopathy: Secondary | ICD-10-CM

## 2022-06-04 DIAGNOSIS — N186 End stage renal disease: Secondary | ICD-10-CM | POA: Diagnosis not present

## 2022-06-04 DIAGNOSIS — J9601 Acute respiratory failure with hypoxia: Secondary | ICD-10-CM | POA: Diagnosis not present

## 2022-06-04 DIAGNOSIS — Z8673 Personal history of transient ischemic attack (TIA), and cerebral infarction without residual deficits: Secondary | ICD-10-CM

## 2022-06-05 DIAGNOSIS — I120 Hypertensive chronic kidney disease with stage 5 chronic kidney disease or end stage renal disease: Secondary | ICD-10-CM | POA: Diagnosis not present

## 2022-06-05 DIAGNOSIS — J9601 Acute respiratory failure with hypoxia: Secondary | ICD-10-CM | POA: Diagnosis not present

## 2022-06-05 DIAGNOSIS — Z992 Dependence on renal dialysis: Secondary | ICD-10-CM | POA: Diagnosis not present

## 2022-06-05 DIAGNOSIS — N186 End stage renal disease: Secondary | ICD-10-CM | POA: Diagnosis not present

## 2022-06-05 DIAGNOSIS — D631 Anemia in chronic kidney disease: Secondary | ICD-10-CM | POA: Diagnosis not present

## 2022-06-05 DIAGNOSIS — R4189 Other symptoms and signs involving cognitive functions and awareness: Secondary | ICD-10-CM | POA: Diagnosis not present

## 2022-06-05 DIAGNOSIS — R2689 Other abnormalities of gait and mobility: Secondary | ICD-10-CM | POA: Diagnosis not present

## 2022-06-05 DIAGNOSIS — R5381 Other malaise: Secondary | ICD-10-CM | POA: Diagnosis not present

## 2022-06-05 DIAGNOSIS — I639 Cerebral infarction, unspecified: Secondary | ICD-10-CM | POA: Diagnosis not present

## 2022-06-05 DIAGNOSIS — I69398 Other sequelae of cerebral infarction: Secondary | ICD-10-CM | POA: Diagnosis not present

## 2022-06-05 DIAGNOSIS — G9341 Metabolic encephalopathy: Secondary | ICD-10-CM | POA: Diagnosis not present

## 2022-06-05 DIAGNOSIS — K631 Perforation of intestine (nontraumatic): Secondary | ICD-10-CM | POA: Diagnosis not present

## 2022-06-05 DIAGNOSIS — J9602 Acute respiratory failure with hypercapnia: Secondary | ICD-10-CM | POA: Diagnosis not present

## 2022-06-06 DIAGNOSIS — R6520 Severe sepsis without septic shock: Secondary | ICD-10-CM | POA: Diagnosis not present

## 2022-06-06 DIAGNOSIS — J690 Pneumonitis due to inhalation of food and vomit: Secondary | ICD-10-CM | POA: Diagnosis not present

## 2022-06-06 DIAGNOSIS — I120 Hypertensive chronic kidney disease with stage 5 chronic kidney disease or end stage renal disease: Secondary | ICD-10-CM | POA: Diagnosis not present

## 2022-06-06 DIAGNOSIS — J9621 Acute and chronic respiratory failure with hypoxia: Secondary | ICD-10-CM | POA: Diagnosis not present

## 2022-06-06 DIAGNOSIS — D631 Anemia in chronic kidney disease: Secondary | ICD-10-CM | POA: Diagnosis not present

## 2022-06-06 DIAGNOSIS — N186 End stage renal disease: Secondary | ICD-10-CM | POA: Diagnosis not present

## 2022-06-06 DIAGNOSIS — J9601 Acute respiratory failure with hypoxia: Secondary | ICD-10-CM | POA: Diagnosis not present

## 2022-06-06 DIAGNOSIS — I639 Cerebral infarction, unspecified: Secondary | ICD-10-CM | POA: Diagnosis not present

## 2022-06-06 DIAGNOSIS — Z992 Dependence on renal dialysis: Secondary | ICD-10-CM | POA: Diagnosis not present

## 2022-06-06 DIAGNOSIS — G9341 Metabolic encephalopathy: Secondary | ICD-10-CM | POA: Diagnosis not present

## 2022-06-06 DIAGNOSIS — J9602 Acute respiratory failure with hypercapnia: Secondary | ICD-10-CM | POA: Diagnosis not present

## 2022-06-06 DIAGNOSIS — K631 Perforation of intestine (nontraumatic): Secondary | ICD-10-CM | POA: Diagnosis not present

## 2022-06-07 DIAGNOSIS — J9601 Acute respiratory failure with hypoxia: Secondary | ICD-10-CM | POA: Diagnosis not present

## 2022-06-07 DIAGNOSIS — N186 End stage renal disease: Secondary | ICD-10-CM | POA: Diagnosis not present

## 2022-06-07 DIAGNOSIS — D631 Anemia in chronic kidney disease: Secondary | ICD-10-CM | POA: Diagnosis not present

## 2022-06-07 DIAGNOSIS — I639 Cerebral infarction, unspecified: Secondary | ICD-10-CM | POA: Diagnosis not present

## 2022-06-07 DIAGNOSIS — Z992 Dependence on renal dialysis: Secondary | ICD-10-CM | POA: Diagnosis not present

## 2022-06-07 DIAGNOSIS — J9621 Acute and chronic respiratory failure with hypoxia: Secondary | ICD-10-CM | POA: Diagnosis not present

## 2022-06-07 DIAGNOSIS — I120 Hypertensive chronic kidney disease with stage 5 chronic kidney disease or end stage renal disease: Secondary | ICD-10-CM | POA: Diagnosis not present

## 2022-06-07 DIAGNOSIS — Z9981 Dependence on supplemental oxygen: Secondary | ICD-10-CM | POA: Diagnosis not present

## 2022-06-07 DIAGNOSIS — J9602 Acute respiratory failure with hypercapnia: Secondary | ICD-10-CM | POA: Diagnosis not present

## 2022-06-07 DIAGNOSIS — G9341 Metabolic encephalopathy: Secondary | ICD-10-CM | POA: Diagnosis not present

## 2022-06-07 DIAGNOSIS — K631 Perforation of intestine (nontraumatic): Secondary | ICD-10-CM | POA: Diagnosis not present

## 2022-06-08 DIAGNOSIS — I120 Hypertensive chronic kidney disease with stage 5 chronic kidney disease or end stage renal disease: Secondary | ICD-10-CM | POA: Diagnosis not present

## 2022-06-08 DIAGNOSIS — Z992 Dependence on renal dialysis: Secondary | ICD-10-CM | POA: Diagnosis not present

## 2022-06-08 DIAGNOSIS — N186 End stage renal disease: Secondary | ICD-10-CM | POA: Diagnosis not present

## 2022-06-08 DIAGNOSIS — Z9981 Dependence on supplemental oxygen: Secondary | ICD-10-CM | POA: Diagnosis not present

## 2022-06-08 DIAGNOSIS — I639 Cerebral infarction, unspecified: Secondary | ICD-10-CM | POA: Diagnosis not present

## 2022-06-08 DIAGNOSIS — D631 Anemia in chronic kidney disease: Secondary | ICD-10-CM | POA: Diagnosis not present

## 2022-06-08 DIAGNOSIS — J9601 Acute respiratory failure with hypoxia: Secondary | ICD-10-CM | POA: Diagnosis not present

## 2022-06-08 DIAGNOSIS — J9621 Acute and chronic respiratory failure with hypoxia: Secondary | ICD-10-CM | POA: Diagnosis not present

## 2022-06-08 DIAGNOSIS — J9602 Acute respiratory failure with hypercapnia: Secondary | ICD-10-CM | POA: Diagnosis not present

## 2022-06-08 DIAGNOSIS — G9341 Metabolic encephalopathy: Secondary | ICD-10-CM | POA: Diagnosis not present

## 2022-06-08 DIAGNOSIS — K631 Perforation of intestine (nontraumatic): Secondary | ICD-10-CM | POA: Diagnosis not present

## 2022-06-09 DIAGNOSIS — G9341 Metabolic encephalopathy: Secondary | ICD-10-CM

## 2022-06-09 DIAGNOSIS — R5381 Other malaise: Secondary | ICD-10-CM

## 2022-06-09 DIAGNOSIS — Z9981 Dependence on supplemental oxygen: Secondary | ICD-10-CM | POA: Diagnosis not present

## 2022-06-09 DIAGNOSIS — J9621 Acute and chronic respiratory failure with hypoxia: Secondary | ICD-10-CM | POA: Diagnosis not present

## 2022-06-09 DIAGNOSIS — N186 End stage renal disease: Secondary | ICD-10-CM

## 2022-06-09 DIAGNOSIS — J9602 Acute respiratory failure with hypercapnia: Secondary | ICD-10-CM

## 2022-06-09 DIAGNOSIS — Z8673 Personal history of transient ischemic attack (TIA), and cerebral infarction without residual deficits: Secondary | ICD-10-CM

## 2022-06-09 DIAGNOSIS — K631 Perforation of intestine (nontraumatic): Secondary | ICD-10-CM

## 2022-06-10 DIAGNOSIS — Z9981 Dependence on supplemental oxygen: Secondary | ICD-10-CM | POA: Diagnosis not present

## 2022-06-10 DIAGNOSIS — R0989 Other specified symptoms and signs involving the circulatory and respiratory systems: Secondary | ICD-10-CM | POA: Diagnosis not present

## 2022-06-10 DIAGNOSIS — J9621 Acute and chronic respiratory failure with hypoxia: Secondary | ICD-10-CM | POA: Diagnosis not present

## 2022-06-10 DIAGNOSIS — K631 Perforation of intestine (nontraumatic): Secondary | ICD-10-CM | POA: Diagnosis not present

## 2022-06-10 DIAGNOSIS — G9341 Metabolic encephalopathy: Secondary | ICD-10-CM | POA: Diagnosis not present

## 2022-06-10 DIAGNOSIS — J9602 Acute respiratory failure with hypercapnia: Secondary | ICD-10-CM | POA: Diagnosis not present

## 2022-06-10 DIAGNOSIS — N186 End stage renal disease: Secondary | ICD-10-CM | POA: Diagnosis not present

## 2022-06-11 DIAGNOSIS — R1312 Dysphagia, oropharyngeal phase: Secondary | ICD-10-CM | POA: Diagnosis not present

## 2022-06-11 DIAGNOSIS — Z992 Dependence on renal dialysis: Secondary | ICD-10-CM | POA: Diagnosis not present

## 2022-06-11 DIAGNOSIS — K631 Perforation of intestine (nontraumatic): Secondary | ICD-10-CM

## 2022-06-11 DIAGNOSIS — Z8673 Personal history of transient ischemic attack (TIA), and cerebral infarction without residual deficits: Secondary | ICD-10-CM

## 2022-06-11 DIAGNOSIS — Z9981 Dependence on supplemental oxygen: Secondary | ICD-10-CM | POA: Diagnosis not present

## 2022-06-11 DIAGNOSIS — D631 Anemia in chronic kidney disease: Secondary | ICD-10-CM | POA: Diagnosis not present

## 2022-06-11 DIAGNOSIS — J9602 Acute respiratory failure with hypercapnia: Secondary | ICD-10-CM

## 2022-06-11 DIAGNOSIS — J9621 Acute and chronic respiratory failure with hypoxia: Secondary | ICD-10-CM | POA: Diagnosis not present

## 2022-06-11 DIAGNOSIS — I120 Hypertensive chronic kidney disease with stage 5 chronic kidney disease or end stage renal disease: Secondary | ICD-10-CM | POA: Diagnosis not present

## 2022-06-11 DIAGNOSIS — G9341 Metabolic encephalopathy: Secondary | ICD-10-CM

## 2022-06-11 DIAGNOSIS — J9601 Acute respiratory failure with hypoxia: Secondary | ICD-10-CM | POA: Diagnosis not present

## 2022-06-11 DIAGNOSIS — R5381 Other malaise: Secondary | ICD-10-CM

## 2022-06-11 DIAGNOSIS — I639 Cerebral infarction, unspecified: Secondary | ICD-10-CM | POA: Diagnosis not present

## 2022-06-11 DIAGNOSIS — J398 Other specified diseases of upper respiratory tract: Secondary | ICD-10-CM | POA: Diagnosis not present

## 2022-06-11 DIAGNOSIS — N186 End stage renal disease: Secondary | ICD-10-CM | POA: Diagnosis not present

## 2022-06-12 DIAGNOSIS — Z9981 Dependence on supplemental oxygen: Secondary | ICD-10-CM | POA: Diagnosis not present

## 2022-06-12 DIAGNOSIS — F411 Generalized anxiety disorder: Secondary | ICD-10-CM | POA: Diagnosis not present

## 2022-06-12 DIAGNOSIS — N186 End stage renal disease: Secondary | ICD-10-CM | POA: Diagnosis not present

## 2022-06-12 DIAGNOSIS — J9601 Acute respiratory failure with hypoxia: Secondary | ICD-10-CM | POA: Diagnosis not present

## 2022-06-12 DIAGNOSIS — Z992 Dependence on renal dialysis: Secondary | ICD-10-CM | POA: Diagnosis not present

## 2022-06-12 DIAGNOSIS — F05 Delirium due to known physiological condition: Secondary | ICD-10-CM | POA: Diagnosis not present

## 2022-06-12 DIAGNOSIS — I639 Cerebral infarction, unspecified: Secondary | ICD-10-CM | POA: Diagnosis not present

## 2022-06-12 DIAGNOSIS — I120 Hypertensive chronic kidney disease with stage 5 chronic kidney disease or end stage renal disease: Secondary | ICD-10-CM | POA: Diagnosis not present

## 2022-06-12 DIAGNOSIS — D631 Anemia in chronic kidney disease: Secondary | ICD-10-CM | POA: Diagnosis not present

## 2022-06-12 DIAGNOSIS — J9621 Acute and chronic respiratory failure with hypoxia: Secondary | ICD-10-CM | POA: Diagnosis not present

## 2022-06-12 DIAGNOSIS — J9602 Acute respiratory failure with hypercapnia: Secondary | ICD-10-CM | POA: Diagnosis not present

## 2022-06-12 DIAGNOSIS — G9341 Metabolic encephalopathy: Secondary | ICD-10-CM | POA: Diagnosis not present

## 2022-06-12 DIAGNOSIS — K631 Perforation of intestine (nontraumatic): Secondary | ICD-10-CM | POA: Diagnosis not present

## 2022-06-13 DIAGNOSIS — Z992 Dependence on renal dialysis: Secondary | ICD-10-CM | POA: Diagnosis not present

## 2022-06-13 DIAGNOSIS — Z9981 Dependence on supplemental oxygen: Secondary | ICD-10-CM | POA: Diagnosis not present

## 2022-06-13 DIAGNOSIS — I639 Cerebral infarction, unspecified: Secondary | ICD-10-CM | POA: Diagnosis not present

## 2022-06-13 DIAGNOSIS — D631 Anemia in chronic kidney disease: Secondary | ICD-10-CM | POA: Diagnosis not present

## 2022-06-13 DIAGNOSIS — J9601 Acute respiratory failure with hypoxia: Secondary | ICD-10-CM | POA: Diagnosis not present

## 2022-06-13 DIAGNOSIS — J398 Other specified diseases of upper respiratory tract: Secondary | ICD-10-CM | POA: Diagnosis not present

## 2022-06-13 DIAGNOSIS — I120 Hypertensive chronic kidney disease with stage 5 chronic kidney disease or end stage renal disease: Secondary | ICD-10-CM | POA: Diagnosis not present

## 2022-06-13 DIAGNOSIS — N186 End stage renal disease: Secondary | ICD-10-CM | POA: Diagnosis not present

## 2022-06-13 DIAGNOSIS — J9621 Acute and chronic respiratory failure with hypoxia: Secondary | ICD-10-CM | POA: Diagnosis not present

## 2022-06-13 DIAGNOSIS — J9602 Acute respiratory failure with hypercapnia: Secondary | ICD-10-CM | POA: Diagnosis not present

## 2022-06-13 DIAGNOSIS — K631 Perforation of intestine (nontraumatic): Secondary | ICD-10-CM | POA: Diagnosis not present

## 2022-06-13 DIAGNOSIS — G9341 Metabolic encephalopathy: Secondary | ICD-10-CM | POA: Diagnosis not present

## 2022-06-14 DIAGNOSIS — Z992 Dependence on renal dialysis: Secondary | ICD-10-CM | POA: Diagnosis not present

## 2022-06-14 DIAGNOSIS — N186 End stage renal disease: Secondary | ICD-10-CM | POA: Diagnosis not present

## 2022-06-14 DIAGNOSIS — D631 Anemia in chronic kidney disease: Secondary | ICD-10-CM | POA: Diagnosis not present

## 2022-06-14 DIAGNOSIS — I639 Cerebral infarction, unspecified: Secondary | ICD-10-CM | POA: Diagnosis not present

## 2022-06-14 DIAGNOSIS — J9601 Acute respiratory failure with hypoxia: Secondary | ICD-10-CM | POA: Diagnosis not present

## 2022-06-14 DIAGNOSIS — J690 Pneumonitis due to inhalation of food and vomit: Secondary | ICD-10-CM | POA: Diagnosis not present

## 2022-06-14 DIAGNOSIS — I120 Hypertensive chronic kidney disease with stage 5 chronic kidney disease or end stage renal disease: Secondary | ICD-10-CM | POA: Diagnosis not present

## 2022-06-14 DIAGNOSIS — J9621 Acute and chronic respiratory failure with hypoxia: Secondary | ICD-10-CM | POA: Diagnosis not present

## 2022-06-14 DIAGNOSIS — R6520 Severe sepsis without septic shock: Secondary | ICD-10-CM | POA: Diagnosis not present

## 2022-06-14 DIAGNOSIS — J9602 Acute respiratory failure with hypercapnia: Secondary | ICD-10-CM | POA: Diagnosis not present

## 2022-06-14 DIAGNOSIS — K631 Perforation of intestine (nontraumatic): Secondary | ICD-10-CM | POA: Diagnosis not present

## 2022-06-14 DIAGNOSIS — G9341 Metabolic encephalopathy: Secondary | ICD-10-CM | POA: Diagnosis not present

## 2022-06-15 DIAGNOSIS — I639 Cerebral infarction, unspecified: Secondary | ICD-10-CM | POA: Diagnosis not present

## 2022-06-15 DIAGNOSIS — J9601 Acute respiratory failure with hypoxia: Secondary | ICD-10-CM | POA: Diagnosis not present

## 2022-06-15 DIAGNOSIS — Z992 Dependence on renal dialysis: Secondary | ICD-10-CM | POA: Diagnosis not present

## 2022-06-15 DIAGNOSIS — D631 Anemia in chronic kidney disease: Secondary | ICD-10-CM | POA: Diagnosis not present

## 2022-06-15 DIAGNOSIS — I120 Hypertensive chronic kidney disease with stage 5 chronic kidney disease or end stage renal disease: Secondary | ICD-10-CM | POA: Diagnosis not present

## 2022-06-15 DIAGNOSIS — N186 End stage renal disease: Secondary | ICD-10-CM

## 2022-06-15 DIAGNOSIS — J9621 Acute and chronic respiratory failure with hypoxia: Secondary | ICD-10-CM

## 2022-06-15 DIAGNOSIS — J690 Pneumonitis due to inhalation of food and vomit: Secondary | ICD-10-CM

## 2022-06-15 DIAGNOSIS — R6520 Severe sepsis without septic shock: Secondary | ICD-10-CM

## 2022-06-15 DIAGNOSIS — G9341 Metabolic encephalopathy: Secondary | ICD-10-CM | POA: Diagnosis not present

## 2022-06-15 DIAGNOSIS — K631 Perforation of intestine (nontraumatic): Secondary | ICD-10-CM | POA: Diagnosis not present

## 2022-06-15 DIAGNOSIS — J9602 Acute respiratory failure with hypercapnia: Secondary | ICD-10-CM | POA: Diagnosis not present

## 2022-06-16 DIAGNOSIS — N186 End stage renal disease: Secondary | ICD-10-CM

## 2022-06-16 DIAGNOSIS — J9621 Acute and chronic respiratory failure with hypoxia: Secondary | ICD-10-CM

## 2022-06-16 DIAGNOSIS — R6520 Severe sepsis without septic shock: Secondary | ICD-10-CM

## 2022-06-16 DIAGNOSIS — J690 Pneumonitis due to inhalation of food and vomit: Secondary | ICD-10-CM

## 2022-06-16 DIAGNOSIS — K631 Perforation of intestine (nontraumatic): Secondary | ICD-10-CM | POA: Diagnosis not present

## 2022-06-16 DIAGNOSIS — G9341 Metabolic encephalopathy: Secondary | ICD-10-CM | POA: Diagnosis not present

## 2022-06-16 DIAGNOSIS — J9602 Acute respiratory failure with hypercapnia: Secondary | ICD-10-CM | POA: Diagnosis not present

## 2022-06-17 DIAGNOSIS — J9621 Acute and chronic respiratory failure with hypoxia: Secondary | ICD-10-CM

## 2022-06-17 DIAGNOSIS — N186 End stage renal disease: Secondary | ICD-10-CM

## 2022-06-17 DIAGNOSIS — R6520 Severe sepsis without septic shock: Secondary | ICD-10-CM

## 2022-06-17 DIAGNOSIS — J690 Pneumonitis due to inhalation of food and vomit: Secondary | ICD-10-CM

## 2022-06-17 DIAGNOSIS — K631 Perforation of intestine (nontraumatic): Secondary | ICD-10-CM | POA: Diagnosis not present

## 2022-06-17 DIAGNOSIS — G9341 Metabolic encephalopathy: Secondary | ICD-10-CM | POA: Diagnosis not present

## 2022-06-17 DIAGNOSIS — J9602 Acute respiratory failure with hypercapnia: Secondary | ICD-10-CM | POA: Diagnosis not present

## 2022-06-18 DIAGNOSIS — R1312 Dysphagia, oropharyngeal phase: Secondary | ICD-10-CM | POA: Diagnosis not present

## 2022-06-18 DIAGNOSIS — J9602 Acute respiratory failure with hypercapnia: Secondary | ICD-10-CM

## 2022-06-18 DIAGNOSIS — Z992 Dependence on renal dialysis: Secondary | ICD-10-CM | POA: Diagnosis not present

## 2022-06-18 DIAGNOSIS — R6520 Severe sepsis without septic shock: Secondary | ICD-10-CM

## 2022-06-18 DIAGNOSIS — J398 Other specified diseases of upper respiratory tract: Secondary | ICD-10-CM | POA: Diagnosis not present

## 2022-06-18 DIAGNOSIS — I120 Hypertensive chronic kidney disease with stage 5 chronic kidney disease or end stage renal disease: Secondary | ICD-10-CM | POA: Diagnosis not present

## 2022-06-18 DIAGNOSIS — J9621 Acute and chronic respiratory failure with hypoxia: Secondary | ICD-10-CM

## 2022-06-18 DIAGNOSIS — J9601 Acute respiratory failure with hypoxia: Secondary | ICD-10-CM | POA: Diagnosis not present

## 2022-06-18 DIAGNOSIS — R5381 Other malaise: Secondary | ICD-10-CM

## 2022-06-18 DIAGNOSIS — N186 End stage renal disease: Secondary | ICD-10-CM

## 2022-06-18 DIAGNOSIS — J690 Pneumonitis due to inhalation of food and vomit: Secondary | ICD-10-CM

## 2022-06-18 DIAGNOSIS — K631 Perforation of intestine (nontraumatic): Secondary | ICD-10-CM

## 2022-06-18 DIAGNOSIS — D631 Anemia in chronic kidney disease: Secondary | ICD-10-CM | POA: Diagnosis not present

## 2022-06-18 DIAGNOSIS — I639 Cerebral infarction, unspecified: Secondary | ICD-10-CM | POA: Diagnosis not present

## 2022-06-18 DIAGNOSIS — Z8673 Personal history of transient ischemic attack (TIA), and cerebral infarction without residual deficits: Secondary | ICD-10-CM

## 2022-06-18 DIAGNOSIS — G9341 Metabolic encephalopathy: Secondary | ICD-10-CM

## 2022-06-19 DIAGNOSIS — D631 Anemia in chronic kidney disease: Secondary | ICD-10-CM | POA: Diagnosis not present

## 2022-06-19 DIAGNOSIS — I639 Cerebral infarction, unspecified: Secondary | ICD-10-CM | POA: Diagnosis not present

## 2022-06-19 DIAGNOSIS — J690 Pneumonitis due to inhalation of food and vomit: Secondary | ICD-10-CM

## 2022-06-19 DIAGNOSIS — I120 Hypertensive chronic kidney disease with stage 5 chronic kidney disease or end stage renal disease: Secondary | ICD-10-CM | POA: Diagnosis not present

## 2022-06-19 DIAGNOSIS — Z992 Dependence on renal dialysis: Secondary | ICD-10-CM | POA: Diagnosis not present

## 2022-06-19 DIAGNOSIS — J9621 Acute and chronic respiratory failure with hypoxia: Secondary | ICD-10-CM

## 2022-06-19 DIAGNOSIS — J9601 Acute respiratory failure with hypoxia: Secondary | ICD-10-CM | POA: Diagnosis not present

## 2022-06-19 DIAGNOSIS — N186 End stage renal disease: Secondary | ICD-10-CM

## 2022-06-19 DIAGNOSIS — R6520 Severe sepsis without septic shock: Secondary | ICD-10-CM

## 2022-06-19 DIAGNOSIS — G9341 Metabolic encephalopathy: Secondary | ICD-10-CM | POA: Diagnosis not present

## 2022-06-19 DIAGNOSIS — J9602 Acute respiratory failure with hypercapnia: Secondary | ICD-10-CM | POA: Diagnosis not present

## 2022-06-19 DIAGNOSIS — K631 Perforation of intestine (nontraumatic): Secondary | ICD-10-CM | POA: Diagnosis not present

## 2022-06-20 DIAGNOSIS — J9601 Acute respiratory failure with hypoxia: Secondary | ICD-10-CM | POA: Diagnosis not present

## 2022-06-20 DIAGNOSIS — I639 Cerebral infarction, unspecified: Secondary | ICD-10-CM | POA: Diagnosis not present

## 2022-06-20 DIAGNOSIS — N186 End stage renal disease: Secondary | ICD-10-CM | POA: Diagnosis not present

## 2022-06-20 DIAGNOSIS — I120 Hypertensive chronic kidney disease with stage 5 chronic kidney disease or end stage renal disease: Secondary | ICD-10-CM | POA: Diagnosis not present

## 2022-06-20 DIAGNOSIS — D631 Anemia in chronic kidney disease: Secondary | ICD-10-CM | POA: Diagnosis not present

## 2022-06-20 DIAGNOSIS — Z992 Dependence on renal dialysis: Secondary | ICD-10-CM | POA: Diagnosis not present

## 2022-06-20 DIAGNOSIS — K631 Perforation of intestine (nontraumatic): Secondary | ICD-10-CM | POA: Diagnosis not present

## 2022-06-20 DIAGNOSIS — J9602 Acute respiratory failure with hypercapnia: Secondary | ICD-10-CM | POA: Diagnosis not present

## 2022-06-20 DIAGNOSIS — G9341 Metabolic encephalopathy: Secondary | ICD-10-CM | POA: Diagnosis not present

## 2022-06-21 DIAGNOSIS — I120 Hypertensive chronic kidney disease with stage 5 chronic kidney disease or end stage renal disease: Secondary | ICD-10-CM | POA: Diagnosis not present

## 2022-06-21 DIAGNOSIS — J9601 Acute respiratory failure with hypoxia: Secondary | ICD-10-CM | POA: Diagnosis not present

## 2022-06-21 DIAGNOSIS — Z992 Dependence on renal dialysis: Secondary | ICD-10-CM | POA: Diagnosis not present

## 2022-06-21 DIAGNOSIS — N186 End stage renal disease: Secondary | ICD-10-CM | POA: Diagnosis not present

## 2022-06-21 DIAGNOSIS — D631 Anemia in chronic kidney disease: Secondary | ICD-10-CM | POA: Diagnosis not present

## 2022-06-21 DIAGNOSIS — I639 Cerebral infarction, unspecified: Secondary | ICD-10-CM | POA: Diagnosis not present

## 2022-06-21 DIAGNOSIS — F411 Generalized anxiety disorder: Secondary | ICD-10-CM | POA: Diagnosis not present

## 2022-06-21 DIAGNOSIS — F05 Delirium due to known physiological condition: Secondary | ICD-10-CM | POA: Diagnosis not present

## 2022-06-21 DIAGNOSIS — J9602 Acute respiratory failure with hypercapnia: Secondary | ICD-10-CM | POA: Diagnosis not present

## 2022-06-21 DIAGNOSIS — G9341 Metabolic encephalopathy: Secondary | ICD-10-CM | POA: Diagnosis not present

## 2022-06-21 DIAGNOSIS — K631 Perforation of intestine (nontraumatic): Secondary | ICD-10-CM | POA: Diagnosis not present

## 2022-06-22 DIAGNOSIS — N186 End stage renal disease: Secondary | ICD-10-CM | POA: Diagnosis not present

## 2022-06-22 DIAGNOSIS — Z992 Dependence on renal dialysis: Secondary | ICD-10-CM | POA: Diagnosis not present

## 2022-06-22 DIAGNOSIS — I120 Hypertensive chronic kidney disease with stage 5 chronic kidney disease or end stage renal disease: Secondary | ICD-10-CM | POA: Diagnosis not present

## 2022-06-22 DIAGNOSIS — I639 Cerebral infarction, unspecified: Secondary | ICD-10-CM | POA: Diagnosis not present

## 2022-06-22 DIAGNOSIS — J9601 Acute respiratory failure with hypoxia: Secondary | ICD-10-CM | POA: Diagnosis not present

## 2022-06-22 DIAGNOSIS — D631 Anemia in chronic kidney disease: Secondary | ICD-10-CM | POA: Diagnosis not present

## 2022-06-22 DIAGNOSIS — K631 Perforation of intestine (nontraumatic): Secondary | ICD-10-CM | POA: Diagnosis not present

## 2022-06-22 DIAGNOSIS — J9602 Acute respiratory failure with hypercapnia: Secondary | ICD-10-CM | POA: Diagnosis not present

## 2022-06-22 DIAGNOSIS — G9341 Metabolic encephalopathy: Secondary | ICD-10-CM | POA: Diagnosis not present

## 2022-06-23 DIAGNOSIS — N186 End stage renal disease: Secondary | ICD-10-CM

## 2022-06-23 DIAGNOSIS — Z8673 Personal history of transient ischemic attack (TIA), and cerebral infarction without residual deficits: Secondary | ICD-10-CM

## 2022-06-23 DIAGNOSIS — R5381 Other malaise: Secondary | ICD-10-CM

## 2022-06-23 DIAGNOSIS — G9341 Metabolic encephalopathy: Secondary | ICD-10-CM

## 2022-06-23 DIAGNOSIS — J9602 Acute respiratory failure with hypercapnia: Secondary | ICD-10-CM

## 2022-06-23 DIAGNOSIS — K631 Perforation of intestine (nontraumatic): Secondary | ICD-10-CM

## 2022-06-24 DIAGNOSIS — R0989 Other specified symptoms and signs involving the circulatory and respiratory systems: Secondary | ICD-10-CM | POA: Diagnosis not present

## 2022-06-24 DIAGNOSIS — J9602 Acute respiratory failure with hypercapnia: Secondary | ICD-10-CM | POA: Diagnosis not present

## 2022-06-24 DIAGNOSIS — K631 Perforation of intestine (nontraumatic): Secondary | ICD-10-CM | POA: Diagnosis not present

## 2022-06-24 DIAGNOSIS — G9341 Metabolic encephalopathy: Secondary | ICD-10-CM | POA: Diagnosis not present

## 2022-06-24 DIAGNOSIS — N186 End stage renal disease: Secondary | ICD-10-CM | POA: Diagnosis not present

## 2022-06-25 DIAGNOSIS — J9601 Acute respiratory failure with hypoxia: Secondary | ICD-10-CM | POA: Diagnosis not present

## 2022-06-25 DIAGNOSIS — Z9981 Dependence on supplemental oxygen: Secondary | ICD-10-CM | POA: Diagnosis not present

## 2022-06-25 DIAGNOSIS — N186 End stage renal disease: Secondary | ICD-10-CM | POA: Diagnosis not present

## 2022-06-25 DIAGNOSIS — Z8673 Personal history of transient ischemic attack (TIA), and cerebral infarction without residual deficits: Secondary | ICD-10-CM

## 2022-06-25 DIAGNOSIS — J9602 Acute respiratory failure with hypercapnia: Secondary | ICD-10-CM

## 2022-06-25 DIAGNOSIS — D631 Anemia in chronic kidney disease: Secondary | ICD-10-CM | POA: Diagnosis not present

## 2022-06-25 DIAGNOSIS — J9621 Acute and chronic respiratory failure with hypoxia: Secondary | ICD-10-CM | POA: Diagnosis not present

## 2022-06-25 DIAGNOSIS — R5381 Other malaise: Secondary | ICD-10-CM

## 2022-06-25 DIAGNOSIS — G9341 Metabolic encephalopathy: Secondary | ICD-10-CM

## 2022-06-25 DIAGNOSIS — K631 Perforation of intestine (nontraumatic): Secondary | ICD-10-CM

## 2022-06-25 DIAGNOSIS — I120 Hypertensive chronic kidney disease with stage 5 chronic kidney disease or end stage renal disease: Secondary | ICD-10-CM | POA: Diagnosis not present

## 2022-06-25 DIAGNOSIS — Z992 Dependence on renal dialysis: Secondary | ICD-10-CM | POA: Diagnosis not present

## 2022-06-25 DIAGNOSIS — I639 Cerebral infarction, unspecified: Secondary | ICD-10-CM | POA: Diagnosis not present

## 2022-06-26 DIAGNOSIS — F411 Generalized anxiety disorder: Secondary | ICD-10-CM | POA: Diagnosis not present

## 2022-06-26 DIAGNOSIS — I120 Hypertensive chronic kidney disease with stage 5 chronic kidney disease or end stage renal disease: Secondary | ICD-10-CM | POA: Diagnosis not present

## 2022-06-26 DIAGNOSIS — D631 Anemia in chronic kidney disease: Secondary | ICD-10-CM | POA: Diagnosis not present

## 2022-06-26 DIAGNOSIS — I639 Cerebral infarction, unspecified: Secondary | ICD-10-CM | POA: Diagnosis not present

## 2022-06-26 DIAGNOSIS — Z992 Dependence on renal dialysis: Secondary | ICD-10-CM | POA: Diagnosis not present

## 2022-06-26 DIAGNOSIS — J9621 Acute and chronic respiratory failure with hypoxia: Secondary | ICD-10-CM | POA: Diagnosis not present

## 2022-06-26 DIAGNOSIS — F05 Delirium due to known physiological condition: Secondary | ICD-10-CM | POA: Diagnosis not present

## 2022-06-26 DIAGNOSIS — N186 End stage renal disease: Secondary | ICD-10-CM | POA: Diagnosis not present

## 2022-06-26 DIAGNOSIS — J9601 Acute respiratory failure with hypoxia: Secondary | ICD-10-CM | POA: Diagnosis not present

## 2022-06-26 DIAGNOSIS — Z9981 Dependence on supplemental oxygen: Secondary | ICD-10-CM | POA: Diagnosis not present

## 2022-06-26 DIAGNOSIS — K631 Perforation of intestine (nontraumatic): Secondary | ICD-10-CM | POA: Diagnosis not present

## 2022-06-26 DIAGNOSIS — J9602 Acute respiratory failure with hypercapnia: Secondary | ICD-10-CM | POA: Diagnosis not present

## 2022-06-26 DIAGNOSIS — G9341 Metabolic encephalopathy: Secondary | ICD-10-CM | POA: Diagnosis not present

## 2022-06-27 DIAGNOSIS — D631 Anemia in chronic kidney disease: Secondary | ICD-10-CM | POA: Diagnosis not present

## 2022-06-27 DIAGNOSIS — G4733 Obstructive sleep apnea (adult) (pediatric): Secondary | ICD-10-CM | POA: Diagnosis not present

## 2022-06-27 DIAGNOSIS — Z9981 Dependence on supplemental oxygen: Secondary | ICD-10-CM | POA: Diagnosis not present

## 2022-06-27 DIAGNOSIS — N186 End stage renal disease: Secondary | ICD-10-CM | POA: Diagnosis not present

## 2022-06-27 DIAGNOSIS — I120 Hypertensive chronic kidney disease with stage 5 chronic kidney disease or end stage renal disease: Secondary | ICD-10-CM | POA: Diagnosis not present

## 2022-06-27 DIAGNOSIS — J9621 Acute and chronic respiratory failure with hypoxia: Secondary | ICD-10-CM | POA: Diagnosis not present

## 2022-06-27 DIAGNOSIS — I639 Cerebral infarction, unspecified: Secondary | ICD-10-CM | POA: Diagnosis not present

## 2022-06-27 DIAGNOSIS — R1312 Dysphagia, oropharyngeal phase: Secondary | ICD-10-CM | POA: Diagnosis not present

## 2022-06-27 DIAGNOSIS — J9601 Acute respiratory failure with hypoxia: Secondary | ICD-10-CM | POA: Diagnosis not present

## 2022-06-27 DIAGNOSIS — Z992 Dependence on renal dialysis: Secondary | ICD-10-CM | POA: Diagnosis not present

## 2022-06-27 DIAGNOSIS — G9341 Metabolic encephalopathy: Secondary | ICD-10-CM | POA: Diagnosis not present

## 2022-06-27 DIAGNOSIS — J9602 Acute respiratory failure with hypercapnia: Secondary | ICD-10-CM | POA: Diagnosis not present

## 2022-06-27 DIAGNOSIS — K631 Perforation of intestine (nontraumatic): Secondary | ICD-10-CM | POA: Diagnosis not present

## 2022-06-28 DIAGNOSIS — D631 Anemia in chronic kidney disease: Secondary | ICD-10-CM | POA: Diagnosis not present

## 2022-06-28 DIAGNOSIS — J9601 Acute respiratory failure with hypoxia: Secondary | ICD-10-CM | POA: Diagnosis not present

## 2022-06-28 DIAGNOSIS — Z992 Dependence on renal dialysis: Secondary | ICD-10-CM | POA: Diagnosis not present

## 2022-06-28 DIAGNOSIS — I639 Cerebral infarction, unspecified: Secondary | ICD-10-CM | POA: Diagnosis not present

## 2022-06-28 DIAGNOSIS — I120 Hypertensive chronic kidney disease with stage 5 chronic kidney disease or end stage renal disease: Secondary | ICD-10-CM | POA: Diagnosis not present

## 2022-06-28 DIAGNOSIS — N186 End stage renal disease: Secondary | ICD-10-CM | POA: Diagnosis not present

## 2022-06-28 DIAGNOSIS — K631 Perforation of intestine (nontraumatic): Secondary | ICD-10-CM | POA: Diagnosis not present

## 2022-06-28 DIAGNOSIS — G9341 Metabolic encephalopathy: Secondary | ICD-10-CM | POA: Diagnosis not present

## 2022-06-28 DIAGNOSIS — J9602 Acute respiratory failure with hypercapnia: Secondary | ICD-10-CM | POA: Diagnosis not present

## 2022-06-29 DIAGNOSIS — I639 Cerebral infarction, unspecified: Secondary | ICD-10-CM | POA: Diagnosis not present

## 2022-06-29 DIAGNOSIS — I120 Hypertensive chronic kidney disease with stage 5 chronic kidney disease or end stage renal disease: Secondary | ICD-10-CM | POA: Diagnosis not present

## 2022-06-29 DIAGNOSIS — D631 Anemia in chronic kidney disease: Secondary | ICD-10-CM | POA: Diagnosis not present

## 2022-06-29 DIAGNOSIS — Z992 Dependence on renal dialysis: Secondary | ICD-10-CM | POA: Diagnosis not present

## 2022-06-29 DIAGNOSIS — N186 End stage renal disease: Secondary | ICD-10-CM | POA: Diagnosis not present

## 2022-06-29 DIAGNOSIS — J9601 Acute respiratory failure with hypoxia: Secondary | ICD-10-CM | POA: Diagnosis not present

## 2022-06-29 DIAGNOSIS — J9602 Acute respiratory failure with hypercapnia: Secondary | ICD-10-CM | POA: Diagnosis not present

## 2022-06-29 DIAGNOSIS — K631 Perforation of intestine (nontraumatic): Secondary | ICD-10-CM | POA: Diagnosis not present

## 2022-06-29 DIAGNOSIS — G9341 Metabolic encephalopathy: Secondary | ICD-10-CM | POA: Diagnosis not present

## 2022-06-30 DIAGNOSIS — K631 Perforation of intestine (nontraumatic): Secondary | ICD-10-CM | POA: Diagnosis not present

## 2022-06-30 DIAGNOSIS — J9602 Acute respiratory failure with hypercapnia: Secondary | ICD-10-CM | POA: Diagnosis not present

## 2022-06-30 DIAGNOSIS — N186 End stage renal disease: Secondary | ICD-10-CM | POA: Diagnosis not present

## 2022-06-30 DIAGNOSIS — G9341 Metabolic encephalopathy: Secondary | ICD-10-CM | POA: Diagnosis not present

## 2022-07-01 DIAGNOSIS — K631 Perforation of intestine (nontraumatic): Secondary | ICD-10-CM | POA: Diagnosis not present

## 2022-07-01 DIAGNOSIS — G9341 Metabolic encephalopathy: Secondary | ICD-10-CM | POA: Diagnosis not present

## 2022-07-01 DIAGNOSIS — N186 End stage renal disease: Secondary | ICD-10-CM | POA: Diagnosis not present

## 2022-07-01 DIAGNOSIS — J9602 Acute respiratory failure with hypercapnia: Secondary | ICD-10-CM | POA: Diagnosis not present

## 2022-07-02 DIAGNOSIS — G9341 Metabolic encephalopathy: Secondary | ICD-10-CM

## 2022-07-02 DIAGNOSIS — R5381 Other malaise: Secondary | ICD-10-CM

## 2022-07-02 DIAGNOSIS — Z992 Dependence on renal dialysis: Secondary | ICD-10-CM | POA: Diagnosis not present

## 2022-07-02 DIAGNOSIS — I120 Hypertensive chronic kidney disease with stage 5 chronic kidney disease or end stage renal disease: Secondary | ICD-10-CM | POA: Diagnosis not present

## 2022-07-02 DIAGNOSIS — K631 Perforation of intestine (nontraumatic): Secondary | ICD-10-CM

## 2022-07-02 DIAGNOSIS — D631 Anemia in chronic kidney disease: Secondary | ICD-10-CM | POA: Diagnosis not present

## 2022-07-02 DIAGNOSIS — Z8673 Personal history of transient ischemic attack (TIA), and cerebral infarction without residual deficits: Secondary | ICD-10-CM

## 2022-07-02 DIAGNOSIS — I639 Cerebral infarction, unspecified: Secondary | ICD-10-CM | POA: Diagnosis not present

## 2022-07-02 DIAGNOSIS — N186 End stage renal disease: Secondary | ICD-10-CM | POA: Diagnosis not present

## 2022-07-02 DIAGNOSIS — J9601 Acute respiratory failure with hypoxia: Secondary | ICD-10-CM | POA: Diagnosis not present

## 2022-07-02 DIAGNOSIS — J9602 Acute respiratory failure with hypercapnia: Secondary | ICD-10-CM

## 2022-07-03 DIAGNOSIS — N186 End stage renal disease: Secondary | ICD-10-CM | POA: Diagnosis not present

## 2022-07-03 DIAGNOSIS — I639 Cerebral infarction, unspecified: Secondary | ICD-10-CM | POA: Diagnosis not present

## 2022-07-03 DIAGNOSIS — Z992 Dependence on renal dialysis: Secondary | ICD-10-CM | POA: Diagnosis not present

## 2022-07-03 DIAGNOSIS — F411 Generalized anxiety disorder: Secondary | ICD-10-CM | POA: Diagnosis not present

## 2022-07-03 DIAGNOSIS — D631 Anemia in chronic kidney disease: Secondary | ICD-10-CM | POA: Diagnosis not present

## 2022-07-03 DIAGNOSIS — I120 Hypertensive chronic kidney disease with stage 5 chronic kidney disease or end stage renal disease: Secondary | ICD-10-CM | POA: Diagnosis not present

## 2022-07-03 DIAGNOSIS — J9601 Acute respiratory failure with hypoxia: Secondary | ICD-10-CM | POA: Diagnosis not present

## 2022-07-03 DIAGNOSIS — F05 Delirium due to known physiological condition: Secondary | ICD-10-CM | POA: Diagnosis not present

## 2022-07-03 DIAGNOSIS — J9602 Acute respiratory failure with hypercapnia: Secondary | ICD-10-CM | POA: Diagnosis not present

## 2022-07-03 DIAGNOSIS — K631 Perforation of intestine (nontraumatic): Secondary | ICD-10-CM | POA: Diagnosis not present

## 2022-07-03 DIAGNOSIS — G9341 Metabolic encephalopathy: Secondary | ICD-10-CM | POA: Diagnosis not present

## 2022-07-04 DIAGNOSIS — Z992 Dependence on renal dialysis: Secondary | ICD-10-CM | POA: Diagnosis not present

## 2022-07-04 DIAGNOSIS — R1312 Dysphagia, oropharyngeal phase: Secondary | ICD-10-CM | POA: Diagnosis not present

## 2022-07-04 DIAGNOSIS — J9601 Acute respiratory failure with hypoxia: Secondary | ICD-10-CM | POA: Diagnosis not present

## 2022-07-04 DIAGNOSIS — I120 Hypertensive chronic kidney disease with stage 5 chronic kidney disease or end stage renal disease: Secondary | ICD-10-CM | POA: Diagnosis not present

## 2022-07-04 DIAGNOSIS — D631 Anemia in chronic kidney disease: Secondary | ICD-10-CM | POA: Diagnosis not present

## 2022-07-04 DIAGNOSIS — I639 Cerebral infarction, unspecified: Secondary | ICD-10-CM | POA: Diagnosis not present

## 2022-07-04 DIAGNOSIS — N186 End stage renal disease: Secondary | ICD-10-CM | POA: Diagnosis not present

## 2022-07-04 DIAGNOSIS — J9602 Acute respiratory failure with hypercapnia: Secondary | ICD-10-CM | POA: Diagnosis not present

## 2022-07-04 DIAGNOSIS — G9341 Metabolic encephalopathy: Secondary | ICD-10-CM | POA: Diagnosis not present

## 2022-07-04 DIAGNOSIS — K631 Perforation of intestine (nontraumatic): Secondary | ICD-10-CM | POA: Diagnosis not present

## 2022-07-05 DIAGNOSIS — J9601 Acute respiratory failure with hypoxia: Secondary | ICD-10-CM | POA: Diagnosis not present

## 2022-07-05 DIAGNOSIS — Z992 Dependence on renal dialysis: Secondary | ICD-10-CM | POA: Diagnosis not present

## 2022-07-05 DIAGNOSIS — I120 Hypertensive chronic kidney disease with stage 5 chronic kidney disease or end stage renal disease: Secondary | ICD-10-CM | POA: Diagnosis not present

## 2022-07-05 DIAGNOSIS — I639 Cerebral infarction, unspecified: Secondary | ICD-10-CM | POA: Diagnosis not present

## 2022-07-05 DIAGNOSIS — D631 Anemia in chronic kidney disease: Secondary | ICD-10-CM | POA: Diagnosis not present

## 2022-07-05 DIAGNOSIS — N186 End stage renal disease: Secondary | ICD-10-CM | POA: Diagnosis not present

## 2022-07-05 DIAGNOSIS — G9341 Metabolic encephalopathy: Secondary | ICD-10-CM | POA: Diagnosis not present

## 2022-07-05 DIAGNOSIS — K631 Perforation of intestine (nontraumatic): Secondary | ICD-10-CM | POA: Diagnosis not present

## 2022-07-05 DIAGNOSIS — J9602 Acute respiratory failure with hypercapnia: Secondary | ICD-10-CM | POA: Diagnosis not present

## 2022-07-06 DIAGNOSIS — Z992 Dependence on renal dialysis: Secondary | ICD-10-CM | POA: Diagnosis not present

## 2022-07-06 DIAGNOSIS — I639 Cerebral infarction, unspecified: Secondary | ICD-10-CM | POA: Diagnosis not present

## 2022-07-06 DIAGNOSIS — N186 End stage renal disease: Secondary | ICD-10-CM | POA: Diagnosis not present

## 2022-07-06 DIAGNOSIS — D631 Anemia in chronic kidney disease: Secondary | ICD-10-CM | POA: Diagnosis not present

## 2022-07-06 DIAGNOSIS — I120 Hypertensive chronic kidney disease with stage 5 chronic kidney disease or end stage renal disease: Secondary | ICD-10-CM | POA: Diagnosis not present

## 2022-07-06 DIAGNOSIS — J9601 Acute respiratory failure with hypoxia: Secondary | ICD-10-CM | POA: Diagnosis not present

## 2022-07-06 DIAGNOSIS — K631 Perforation of intestine (nontraumatic): Secondary | ICD-10-CM | POA: Diagnosis not present

## 2022-07-06 DIAGNOSIS — G9341 Metabolic encephalopathy: Secondary | ICD-10-CM | POA: Diagnosis not present

## 2022-07-06 DIAGNOSIS — J9602 Acute respiratory failure with hypercapnia: Secondary | ICD-10-CM | POA: Diagnosis not present

## 2022-07-07 DIAGNOSIS — K631 Perforation of intestine (nontraumatic): Secondary | ICD-10-CM

## 2022-07-07 DIAGNOSIS — R5381 Other malaise: Secondary | ICD-10-CM

## 2022-07-07 DIAGNOSIS — J9602 Acute respiratory failure with hypercapnia: Secondary | ICD-10-CM

## 2022-07-07 DIAGNOSIS — G9341 Metabolic encephalopathy: Secondary | ICD-10-CM

## 2022-07-07 DIAGNOSIS — N186 End stage renal disease: Secondary | ICD-10-CM

## 2022-07-08 DIAGNOSIS — J9602 Acute respiratory failure with hypercapnia: Secondary | ICD-10-CM | POA: Diagnosis not present

## 2022-07-08 DIAGNOSIS — G9341 Metabolic encephalopathy: Secondary | ICD-10-CM | POA: Diagnosis not present

## 2022-07-08 DIAGNOSIS — N186 End stage renal disease: Secondary | ICD-10-CM | POA: Diagnosis not present

## 2022-07-08 DIAGNOSIS — K631 Perforation of intestine (nontraumatic): Secondary | ICD-10-CM | POA: Diagnosis not present

## 2022-07-09 DIAGNOSIS — Z95828 Presence of other vascular implants and grafts: Secondary | ICD-10-CM | POA: Diagnosis not present

## 2022-07-09 DIAGNOSIS — R404 Transient alteration of awareness: Secondary | ICD-10-CM | POA: Diagnosis not present

## 2022-07-09 DIAGNOSIS — I491 Atrial premature depolarization: Secondary | ICD-10-CM | POA: Diagnosis not present

## 2022-07-09 DIAGNOSIS — Z7409 Other reduced mobility: Secondary | ICD-10-CM | POA: Diagnosis not present

## 2022-07-09 DIAGNOSIS — M5416 Radiculopathy, lumbar region: Secondary | ICD-10-CM | POA: Diagnosis not present

## 2022-07-09 DIAGNOSIS — R9082 White matter disease, unspecified: Secondary | ICD-10-CM | POA: Diagnosis not present

## 2022-07-09 DIAGNOSIS — J988 Other specified respiratory disorders: Secondary | ICD-10-CM | POA: Diagnosis not present

## 2022-07-09 DIAGNOSIS — K5730 Diverticulosis of large intestine without perforation or abscess without bleeding: Secondary | ICD-10-CM | POA: Diagnosis not present

## 2022-07-09 DIAGNOSIS — I953 Hypotension of hemodialysis: Secondary | ICD-10-CM | POA: Diagnosis not present

## 2022-07-09 DIAGNOSIS — R2681 Unsteadiness on feet: Secondary | ICD-10-CM | POA: Diagnosis not present

## 2022-07-09 DIAGNOSIS — G9341 Metabolic encephalopathy: Secondary | ICD-10-CM | POA: Diagnosis not present

## 2022-07-09 DIAGNOSIS — I6389 Other cerebral infarction: Secondary | ICD-10-CM | POA: Diagnosis not present

## 2022-07-09 DIAGNOSIS — Z23 Encounter for immunization: Secondary | ICD-10-CM | POA: Diagnosis not present

## 2022-07-09 DIAGNOSIS — I120 Hypertensive chronic kidney disease with stage 5 chronic kidney disease or end stage renal disease: Secondary | ICD-10-CM | POA: Diagnosis not present

## 2022-07-09 DIAGNOSIS — K529 Noninfective gastroenteritis and colitis, unspecified: Secondary | ICD-10-CM | POA: Diagnosis not present

## 2022-07-09 DIAGNOSIS — L89153 Pressure ulcer of sacral region, stage 3: Secondary | ICD-10-CM | POA: Diagnosis not present

## 2022-07-09 DIAGNOSIS — E860 Dehydration: Secondary | ICD-10-CM | POA: Diagnosis not present

## 2022-07-09 DIAGNOSIS — R1312 Dysphagia, oropharyngeal phase: Secondary | ICD-10-CM | POA: Diagnosis not present

## 2022-07-09 DIAGNOSIS — L89616 Pressure-induced deep tissue damage of right heel: Secondary | ICD-10-CM | POA: Diagnosis not present

## 2022-07-09 DIAGNOSIS — R4182 Altered mental status, unspecified: Secondary | ICD-10-CM | POA: Diagnosis not present

## 2022-07-09 DIAGNOSIS — R112 Nausea with vomiting, unspecified: Secondary | ICD-10-CM | POA: Diagnosis not present

## 2022-07-09 DIAGNOSIS — J9602 Acute respiratory failure with hypercapnia: Secondary | ICD-10-CM | POA: Diagnosis not present

## 2022-07-09 DIAGNOSIS — H538 Other visual disturbances: Secondary | ICD-10-CM | POA: Diagnosis not present

## 2022-07-09 DIAGNOSIS — Z5181 Encounter for therapeutic drug level monitoring: Secondary | ICD-10-CM | POA: Diagnosis not present

## 2022-07-09 DIAGNOSIS — Z7189 Other specified counseling: Secondary | ICD-10-CM | POA: Diagnosis not present

## 2022-07-09 DIAGNOSIS — M2550 Pain in unspecified joint: Secondary | ICD-10-CM | POA: Diagnosis not present

## 2022-07-09 DIAGNOSIS — E271 Primary adrenocortical insufficiency: Secondary | ICD-10-CM | POA: Diagnosis not present

## 2022-07-09 DIAGNOSIS — E785 Hyperlipidemia, unspecified: Secondary | ICD-10-CM | POA: Diagnosis not present

## 2022-07-09 DIAGNOSIS — Z794 Long term (current) use of insulin: Secondary | ICD-10-CM | POA: Diagnosis not present

## 2022-07-09 DIAGNOSIS — Z8673 Personal history of transient ischemic attack (TIA), and cerebral infarction without residual deficits: Secondary | ICD-10-CM | POA: Diagnosis not present

## 2022-07-09 DIAGNOSIS — R4189 Other symptoms and signs involving cognitive functions and awareness: Secondary | ICD-10-CM | POA: Diagnosis not present

## 2022-07-09 DIAGNOSIS — M5410 Radiculopathy, site unspecified: Secondary | ICD-10-CM | POA: Diagnosis not present

## 2022-07-09 DIAGNOSIS — Z7901 Long term (current) use of anticoagulants: Secondary | ICD-10-CM | POA: Diagnosis not present

## 2022-07-09 DIAGNOSIS — N2581 Secondary hyperparathyroidism of renal origin: Secondary | ICD-10-CM | POA: Diagnosis not present

## 2022-07-09 DIAGNOSIS — R061 Stridor: Secondary | ICD-10-CM | POA: Diagnosis not present

## 2022-07-09 DIAGNOSIS — Z7952 Long term (current) use of systemic steroids: Secondary | ICD-10-CM | POA: Diagnosis not present

## 2022-07-09 DIAGNOSIS — Z86718 Personal history of other venous thrombosis and embolism: Secondary | ICD-10-CM | POA: Diagnosis not present

## 2022-07-09 DIAGNOSIS — R531 Weakness: Secondary | ICD-10-CM | POA: Diagnosis not present

## 2022-07-09 DIAGNOSIS — D638 Anemia in other chronic diseases classified elsewhere: Secondary | ICD-10-CM | POA: Diagnosis not present

## 2022-07-09 DIAGNOSIS — T829XXA Unspecified complication of cardiac and vascular prosthetic device, implant and graft, initial encounter: Secondary | ICD-10-CM | POA: Diagnosis not present

## 2022-07-09 DIAGNOSIS — T8249XA Other complication of vascular dialysis catheter, initial encounter: Secondary | ICD-10-CM | POA: Diagnosis not present

## 2022-07-09 DIAGNOSIS — L89323 Pressure ulcer of left buttock, stage 3: Secondary | ICD-10-CM | POA: Diagnosis not present

## 2022-07-09 DIAGNOSIS — E782 Mixed hyperlipidemia: Secondary | ICD-10-CM | POA: Diagnosis not present

## 2022-07-09 DIAGNOSIS — R5381 Other malaise: Secondary | ICD-10-CM | POA: Diagnosis not present

## 2022-07-09 DIAGNOSIS — K219 Gastro-esophageal reflux disease without esophagitis: Secondary | ICD-10-CM | POA: Diagnosis not present

## 2022-07-09 DIAGNOSIS — E039 Hypothyroidism, unspecified: Secondary | ICD-10-CM | POA: Diagnosis not present

## 2022-07-09 DIAGNOSIS — Z992 Dependence on renal dialysis: Secondary | ICD-10-CM | POA: Diagnosis not present

## 2022-07-09 DIAGNOSIS — R918 Other nonspecific abnormal finding of lung field: Secondary | ICD-10-CM | POA: Diagnosis not present

## 2022-07-09 DIAGNOSIS — J9601 Acute respiratory failure with hypoxia: Secondary | ICD-10-CM | POA: Diagnosis not present

## 2022-07-09 DIAGNOSIS — K6289 Other specified diseases of anus and rectum: Secondary | ICD-10-CM | POA: Diagnosis not present

## 2022-07-09 DIAGNOSIS — M6281 Muscle weakness (generalized): Secondary | ICD-10-CM | POA: Diagnosis not present

## 2022-07-09 DIAGNOSIS — L89610 Pressure ulcer of right heel, unstageable: Secondary | ICD-10-CM | POA: Diagnosis not present

## 2022-07-09 DIAGNOSIS — N186 End stage renal disease: Secondary | ICD-10-CM | POA: Diagnosis present

## 2022-07-09 DIAGNOSIS — F419 Anxiety disorder, unspecified: Secondary | ICD-10-CM | POA: Diagnosis not present

## 2022-07-09 DIAGNOSIS — K7460 Unspecified cirrhosis of liver: Secondary | ICD-10-CM | POA: Diagnosis not present

## 2022-07-09 DIAGNOSIS — T380X5A Adverse effect of glucocorticoids and synthetic analogues, initial encounter: Secondary | ICD-10-CM | POA: Diagnosis not present

## 2022-07-09 DIAGNOSIS — I639 Cerebral infarction, unspecified: Secondary | ICD-10-CM | POA: Diagnosis not present

## 2022-07-09 DIAGNOSIS — I10 Essential (primary) hypertension: Secondary | ICD-10-CM | POA: Diagnosis present

## 2022-07-09 DIAGNOSIS — R29898 Other symptoms and signs involving the musculoskeletal system: Secondary | ICD-10-CM | POA: Diagnosis not present

## 2022-07-09 DIAGNOSIS — J9811 Atelectasis: Secondary | ICD-10-CM | POA: Diagnosis not present

## 2022-07-09 DIAGNOSIS — Z79891 Long term (current) use of opiate analgesic: Secondary | ICD-10-CM | POA: Diagnosis not present

## 2022-07-09 DIAGNOSIS — D631 Anemia in chronic kidney disease: Secondary | ICD-10-CM | POA: Diagnosis not present

## 2022-07-09 DIAGNOSIS — R1310 Dysphagia, unspecified: Secondary | ICD-10-CM | POA: Diagnosis not present

## 2022-07-09 DIAGNOSIS — R9431 Abnormal electrocardiogram [ECG] [EKG]: Secondary | ICD-10-CM | POA: Diagnosis not present

## 2022-07-09 DIAGNOSIS — F4320 Adjustment disorder, unspecified: Secondary | ICD-10-CM | POA: Diagnosis not present

## 2022-07-09 DIAGNOSIS — R2689 Other abnormalities of gait and mobility: Secondary | ICD-10-CM | POA: Diagnosis not present

## 2022-07-09 DIAGNOSIS — E875 Hyperkalemia: Secondary | ICD-10-CM | POA: Diagnosis not present

## 2022-07-09 DIAGNOSIS — Z0389 Encounter for observation for other suspected diseases and conditions ruled out: Secondary | ICD-10-CM | POA: Diagnosis not present

## 2022-07-09 DIAGNOSIS — R1110 Vomiting, unspecified: Secondary | ICD-10-CM | POA: Diagnosis not present

## 2022-07-09 DIAGNOSIS — Z743 Need for continuous supervision: Secondary | ICD-10-CM | POA: Diagnosis not present

## 2022-07-09 DIAGNOSIS — K5792 Diverticulitis of intestine, part unspecified, without perforation or abscess without bleeding: Secondary | ICD-10-CM | POA: Diagnosis not present

## 2022-07-09 DIAGNOSIS — F32A Depression, unspecified: Secondary | ICD-10-CM | POA: Diagnosis not present

## 2022-07-09 DIAGNOSIS — N189 Chronic kidney disease, unspecified: Secondary | ICD-10-CM | POA: Diagnosis not present

## 2022-07-09 DIAGNOSIS — R079 Chest pain, unspecified: Secondary | ICD-10-CM | POA: Diagnosis not present

## 2022-07-09 DIAGNOSIS — I959 Hypotension, unspecified: Secondary | ICD-10-CM | POA: Diagnosis not present

## 2022-07-09 DIAGNOSIS — Z933 Colostomy status: Secondary | ICD-10-CM | POA: Diagnosis not present

## 2022-07-09 DIAGNOSIS — I4510 Unspecified right bundle-branch block: Secondary | ICD-10-CM | POA: Diagnosis not present

## 2022-07-09 DIAGNOSIS — E1122 Type 2 diabetes mellitus with diabetic chronic kidney disease: Secondary | ICD-10-CM | POA: Diagnosis not present

## 2022-07-09 DIAGNOSIS — J9621 Acute and chronic respiratory failure with hypoxia: Secondary | ICD-10-CM | POA: Diagnosis not present

## 2022-07-09 DIAGNOSIS — R110 Nausea: Secondary | ICD-10-CM | POA: Diagnosis not present

## 2022-07-09 DIAGNOSIS — E119 Type 2 diabetes mellitus without complications: Secondary | ICD-10-CM | POA: Diagnosis not present

## 2022-07-09 DIAGNOSIS — Z7401 Bed confinement status: Secondary | ICD-10-CM | POA: Diagnosis not present

## 2022-07-09 DIAGNOSIS — I69398 Other sequelae of cerebral infarction: Secondary | ICD-10-CM | POA: Diagnosis not present

## 2022-07-09 DIAGNOSIS — Z880 Allergy status to penicillin: Secondary | ICD-10-CM | POA: Diagnosis not present

## 2022-07-09 DIAGNOSIS — N390 Urinary tract infection, site not specified: Secondary | ICD-10-CM | POA: Diagnosis not present

## 2022-07-09 DIAGNOSIS — Z792 Long term (current) use of antibiotics: Secondary | ICD-10-CM | POA: Diagnosis not present

## 2022-07-09 DIAGNOSIS — E273 Drug-induced adrenocortical insufficiency: Secondary | ICD-10-CM | POA: Diagnosis not present

## 2022-07-09 DIAGNOSIS — J690 Pneumonitis due to inhalation of food and vomit: Secondary | ICD-10-CM | POA: Diagnosis not present

## 2022-07-09 DIAGNOSIS — D509 Iron deficiency anemia, unspecified: Secondary | ICD-10-CM | POA: Diagnosis not present

## 2022-07-09 DIAGNOSIS — E871 Hypo-osmolality and hyponatremia: Secondary | ICD-10-CM | POA: Diagnosis not present

## 2022-07-09 DIAGNOSIS — Z741 Need for assistance with personal care: Secondary | ICD-10-CM | POA: Diagnosis not present

## 2022-07-09 DIAGNOSIS — Z932 Ileostomy status: Secondary | ICD-10-CM | POA: Diagnosis not present

## 2022-07-09 DIAGNOSIS — K631 Perforation of intestine (nontraumatic): Secondary | ICD-10-CM | POA: Diagnosis not present

## 2022-07-09 DIAGNOSIS — N3289 Other specified disorders of bladder: Secondary | ICD-10-CM | POA: Diagnosis not present

## 2022-07-09 DIAGNOSIS — I509 Heart failure, unspecified: Secondary | ICD-10-CM | POA: Diagnosis not present

## 2022-07-09 DIAGNOSIS — I69391 Dysphagia following cerebral infarction: Secondary | ICD-10-CM | POA: Diagnosis not present

## 2022-07-09 DIAGNOSIS — R5383 Other fatigue: Secondary | ICD-10-CM | POA: Diagnosis not present

## 2022-07-09 DIAGNOSIS — Z931 Gastrostomy status: Secondary | ICD-10-CM | POA: Diagnosis not present

## 2022-07-09 DIAGNOSIS — R34 Anuria and oliguria: Secondary | ICD-10-CM | POA: Diagnosis not present

## 2022-07-10 DIAGNOSIS — E119 Type 2 diabetes mellitus without complications: Secondary | ICD-10-CM | POA: Diagnosis not present

## 2022-07-10 DIAGNOSIS — G9341 Metabolic encephalopathy: Secondary | ICD-10-CM | POA: Diagnosis not present

## 2022-07-10 DIAGNOSIS — N186 End stage renal disease: Secondary | ICD-10-CM | POA: Diagnosis not present

## 2022-07-10 DIAGNOSIS — M5416 Radiculopathy, lumbar region: Secondary | ICD-10-CM | POA: Diagnosis not present

## 2022-07-11 DIAGNOSIS — N2581 Secondary hyperparathyroidism of renal origin: Secondary | ICD-10-CM | POA: Diagnosis not present

## 2022-07-11 DIAGNOSIS — D509 Iron deficiency anemia, unspecified: Secondary | ICD-10-CM | POA: Diagnosis not present

## 2022-07-11 DIAGNOSIS — T8249XA Other complication of vascular dialysis catheter, initial encounter: Secondary | ICD-10-CM | POA: Diagnosis not present

## 2022-07-11 DIAGNOSIS — N186 End stage renal disease: Secondary | ICD-10-CM | POA: Diagnosis not present

## 2022-07-11 DIAGNOSIS — Z23 Encounter for immunization: Secondary | ICD-10-CM | POA: Diagnosis not present

## 2022-07-13 DIAGNOSIS — L89616 Pressure-induced deep tissue damage of right heel: Secondary | ICD-10-CM | POA: Diagnosis not present

## 2022-07-13 DIAGNOSIS — L89153 Pressure ulcer of sacral region, stage 3: Secondary | ICD-10-CM | POA: Diagnosis not present

## 2022-07-13 DIAGNOSIS — D509 Iron deficiency anemia, unspecified: Secondary | ICD-10-CM | POA: Diagnosis not present

## 2022-07-13 DIAGNOSIS — N186 End stage renal disease: Secondary | ICD-10-CM | POA: Diagnosis not present

## 2022-07-13 DIAGNOSIS — T8249XA Other complication of vascular dialysis catheter, initial encounter: Secondary | ICD-10-CM | POA: Diagnosis not present

## 2022-07-13 DIAGNOSIS — Z23 Encounter for immunization: Secondary | ICD-10-CM | POA: Diagnosis not present

## 2022-07-13 DIAGNOSIS — N2581 Secondary hyperparathyroidism of renal origin: Secondary | ICD-10-CM | POA: Diagnosis not present

## 2022-07-13 DIAGNOSIS — E119 Type 2 diabetes mellitus without complications: Secondary | ICD-10-CM | POA: Diagnosis not present

## 2022-07-16 DIAGNOSIS — D509 Iron deficiency anemia, unspecified: Secondary | ICD-10-CM | POA: Diagnosis not present

## 2022-07-16 DIAGNOSIS — N189 Chronic kidney disease, unspecified: Secondary | ICD-10-CM | POA: Diagnosis not present

## 2022-07-16 DIAGNOSIS — J9601 Acute respiratory failure with hypoxia: Secondary | ICD-10-CM | POA: Diagnosis not present

## 2022-07-16 DIAGNOSIS — Z23 Encounter for immunization: Secondary | ICD-10-CM | POA: Diagnosis not present

## 2022-07-16 DIAGNOSIS — E119 Type 2 diabetes mellitus without complications: Secondary | ICD-10-CM | POA: Diagnosis not present

## 2022-07-16 DIAGNOSIS — N186 End stage renal disease: Secondary | ICD-10-CM | POA: Diagnosis not present

## 2022-07-16 DIAGNOSIS — T8249XA Other complication of vascular dialysis catheter, initial encounter: Secondary | ICD-10-CM | POA: Diagnosis not present

## 2022-07-16 DIAGNOSIS — N2581 Secondary hyperparathyroidism of renal origin: Secondary | ICD-10-CM | POA: Diagnosis not present

## 2022-07-16 DIAGNOSIS — M5416 Radiculopathy, lumbar region: Secondary | ICD-10-CM | POA: Diagnosis not present

## 2022-07-17 DIAGNOSIS — R1310 Dysphagia, unspecified: Secondary | ICD-10-CM | POA: Diagnosis not present

## 2022-07-18 DIAGNOSIS — Z23 Encounter for immunization: Secondary | ICD-10-CM | POA: Diagnosis not present

## 2022-07-18 DIAGNOSIS — N2581 Secondary hyperparathyroidism of renal origin: Secondary | ICD-10-CM | POA: Diagnosis not present

## 2022-07-18 DIAGNOSIS — T8249XA Other complication of vascular dialysis catheter, initial encounter: Secondary | ICD-10-CM | POA: Diagnosis not present

## 2022-07-18 DIAGNOSIS — D509 Iron deficiency anemia, unspecified: Secondary | ICD-10-CM | POA: Diagnosis not present

## 2022-07-18 DIAGNOSIS — N186 End stage renal disease: Secondary | ICD-10-CM | POA: Diagnosis not present

## 2022-07-19 DIAGNOSIS — R1310 Dysphagia, unspecified: Secondary | ICD-10-CM | POA: Diagnosis not present

## 2022-07-20 DIAGNOSIS — N2581 Secondary hyperparathyroidism of renal origin: Secondary | ICD-10-CM | POA: Diagnosis not present

## 2022-07-20 DIAGNOSIS — T8249XA Other complication of vascular dialysis catheter, initial encounter: Secondary | ICD-10-CM | POA: Diagnosis not present

## 2022-07-20 DIAGNOSIS — Z23 Encounter for immunization: Secondary | ICD-10-CM | POA: Diagnosis not present

## 2022-07-20 DIAGNOSIS — D509 Iron deficiency anemia, unspecified: Secondary | ICD-10-CM | POA: Diagnosis not present

## 2022-07-20 DIAGNOSIS — N186 End stage renal disease: Secondary | ICD-10-CM | POA: Diagnosis not present

## 2022-07-23 DIAGNOSIS — I959 Hypotension, unspecified: Secondary | ICD-10-CM | POA: Diagnosis not present

## 2022-07-23 DIAGNOSIS — T829XXA Unspecified complication of cardiac and vascular prosthetic device, implant and graft, initial encounter: Secondary | ICD-10-CM | POA: Diagnosis not present

## 2022-07-23 DIAGNOSIS — T8249XA Other complication of vascular dialysis catheter, initial encounter: Secondary | ICD-10-CM | POA: Diagnosis not present

## 2022-07-23 DIAGNOSIS — N186 End stage renal disease: Secondary | ICD-10-CM | POA: Diagnosis not present

## 2022-07-23 DIAGNOSIS — I491 Atrial premature depolarization: Secondary | ICD-10-CM | POA: Diagnosis not present

## 2022-07-23 DIAGNOSIS — N2581 Secondary hyperparathyroidism of renal origin: Secondary | ICD-10-CM | POA: Diagnosis not present

## 2022-07-23 DIAGNOSIS — I4510 Unspecified right bundle-branch block: Secondary | ICD-10-CM | POA: Diagnosis not present

## 2022-07-23 DIAGNOSIS — D509 Iron deficiency anemia, unspecified: Secondary | ICD-10-CM | POA: Diagnosis not present

## 2022-07-23 DIAGNOSIS — Z23 Encounter for immunization: Secondary | ICD-10-CM | POA: Diagnosis not present

## 2022-07-23 DIAGNOSIS — Z992 Dependence on renal dialysis: Secondary | ICD-10-CM | POA: Diagnosis not present

## 2022-07-24 DIAGNOSIS — R112 Nausea with vomiting, unspecified: Secondary | ICD-10-CM | POA: Diagnosis not present

## 2022-07-24 DIAGNOSIS — Z992 Dependence on renal dialysis: Secondary | ICD-10-CM | POA: Diagnosis not present

## 2022-07-24 DIAGNOSIS — N186 End stage renal disease: Secondary | ICD-10-CM | POA: Diagnosis not present

## 2022-07-25 DIAGNOSIS — Z992 Dependence on renal dialysis: Secondary | ICD-10-CM | POA: Diagnosis not present

## 2022-07-25 DIAGNOSIS — Z0389 Encounter for observation for other suspected diseases and conditions ruled out: Secondary | ICD-10-CM | POA: Diagnosis not present

## 2022-07-25 DIAGNOSIS — R9431 Abnormal electrocardiogram [ECG] [EKG]: Secondary | ICD-10-CM | POA: Diagnosis not present

## 2022-07-25 DIAGNOSIS — M5416 Radiculopathy, lumbar region: Secondary | ICD-10-CM | POA: Diagnosis not present

## 2022-07-25 DIAGNOSIS — Z743 Need for continuous supervision: Secondary | ICD-10-CM | POA: Diagnosis not present

## 2022-07-25 DIAGNOSIS — I491 Atrial premature depolarization: Secondary | ICD-10-CM | POA: Diagnosis not present

## 2022-07-25 DIAGNOSIS — M6281 Muscle weakness (generalized): Secondary | ICD-10-CM | POA: Diagnosis not present

## 2022-07-25 DIAGNOSIS — T380X5A Adverse effect of glucocorticoids and synthetic analogues, initial encounter: Secondary | ICD-10-CM | POA: Diagnosis not present

## 2022-07-25 DIAGNOSIS — Z741 Need for assistance with personal care: Secondary | ICD-10-CM | POA: Diagnosis not present

## 2022-07-25 DIAGNOSIS — Z8673 Personal history of transient ischemic attack (TIA), and cerebral infarction without residual deficits: Secondary | ICD-10-CM | POA: Diagnosis not present

## 2022-07-25 DIAGNOSIS — N390 Urinary tract infection, site not specified: Secondary | ICD-10-CM | POA: Diagnosis not present

## 2022-07-25 DIAGNOSIS — Z932 Ileostomy status: Secondary | ICD-10-CM | POA: Diagnosis not present

## 2022-07-25 DIAGNOSIS — E1122 Type 2 diabetes mellitus with diabetic chronic kidney disease: Secondary | ICD-10-CM | POA: Diagnosis not present

## 2022-07-25 DIAGNOSIS — L89323 Pressure ulcer of left buttock, stage 3: Secondary | ICD-10-CM | POA: Diagnosis not present

## 2022-07-25 DIAGNOSIS — Z931 Gastrostomy status: Secondary | ICD-10-CM | POA: Diagnosis not present

## 2022-07-25 DIAGNOSIS — N186 End stage renal disease: Secondary | ICD-10-CM | POA: Diagnosis not present

## 2022-07-25 DIAGNOSIS — Z7409 Other reduced mobility: Secondary | ICD-10-CM | POA: Diagnosis not present

## 2022-07-25 DIAGNOSIS — I69391 Dysphagia following cerebral infarction: Secondary | ICD-10-CM | POA: Diagnosis not present

## 2022-07-25 DIAGNOSIS — K5792 Diverticulitis of intestine, part unspecified, without perforation or abscess without bleeding: Secondary | ICD-10-CM | POA: Diagnosis not present

## 2022-07-25 DIAGNOSIS — J9602 Acute respiratory failure with hypercapnia: Secondary | ICD-10-CM | POA: Diagnosis not present

## 2022-07-25 DIAGNOSIS — R1110 Vomiting, unspecified: Secondary | ICD-10-CM | POA: Diagnosis not present

## 2022-07-25 DIAGNOSIS — I6389 Other cerebral infarction: Secondary | ICD-10-CM | POA: Diagnosis not present

## 2022-07-25 DIAGNOSIS — L89610 Pressure ulcer of right heel, unstageable: Secondary | ICD-10-CM | POA: Diagnosis not present

## 2022-07-25 DIAGNOSIS — Z7952 Long term (current) use of systemic steroids: Secondary | ICD-10-CM | POA: Diagnosis not present

## 2022-07-25 DIAGNOSIS — R531 Weakness: Secondary | ICD-10-CM | POA: Diagnosis not present

## 2022-07-25 DIAGNOSIS — R2681 Unsteadiness on feet: Secondary | ICD-10-CM | POA: Diagnosis not present

## 2022-07-25 DIAGNOSIS — E119 Type 2 diabetes mellitus without complications: Secondary | ICD-10-CM | POA: Diagnosis not present

## 2022-07-25 DIAGNOSIS — Z79891 Long term (current) use of opiate analgesic: Secondary | ICD-10-CM | POA: Diagnosis not present

## 2022-07-25 DIAGNOSIS — K5730 Diverticulosis of large intestine without perforation or abscess without bleeding: Secondary | ICD-10-CM | POA: Diagnosis not present

## 2022-07-25 DIAGNOSIS — L89153 Pressure ulcer of sacral region, stage 3: Secondary | ICD-10-CM | POA: Diagnosis not present

## 2022-07-25 DIAGNOSIS — E875 Hyperkalemia: Secondary | ICD-10-CM | POA: Diagnosis not present

## 2022-07-25 DIAGNOSIS — R29898 Other symptoms and signs involving the musculoskeletal system: Secondary | ICD-10-CM | POA: Diagnosis not present

## 2022-07-25 DIAGNOSIS — Z933 Colostomy status: Secondary | ICD-10-CM | POA: Diagnosis not present

## 2022-07-25 DIAGNOSIS — Z794 Long term (current) use of insulin: Secondary | ICD-10-CM | POA: Diagnosis not present

## 2022-07-25 DIAGNOSIS — K6289 Other specified diseases of anus and rectum: Secondary | ICD-10-CM | POA: Diagnosis not present

## 2022-07-25 DIAGNOSIS — K529 Noninfective gastroenteritis and colitis, unspecified: Secondary | ICD-10-CM | POA: Diagnosis not present

## 2022-07-25 DIAGNOSIS — I959 Hypotension, unspecified: Secondary | ICD-10-CM | POA: Diagnosis not present

## 2022-07-25 DIAGNOSIS — G9341 Metabolic encephalopathy: Secondary | ICD-10-CM | POA: Diagnosis not present

## 2022-07-25 DIAGNOSIS — R918 Other nonspecific abnormal finding of lung field: Secondary | ICD-10-CM | POA: Diagnosis not present

## 2022-07-25 DIAGNOSIS — E039 Hypothyroidism, unspecified: Secondary | ICD-10-CM | POA: Diagnosis not present

## 2022-07-25 DIAGNOSIS — E871 Hypo-osmolality and hyponatremia: Secondary | ICD-10-CM | POA: Diagnosis not present

## 2022-07-25 DIAGNOSIS — R9082 White matter disease, unspecified: Secondary | ICD-10-CM | POA: Diagnosis not present

## 2022-07-25 DIAGNOSIS — E273 Drug-induced adrenocortical insufficiency: Secondary | ICD-10-CM | POA: Diagnosis not present

## 2022-07-25 DIAGNOSIS — D631 Anemia in chronic kidney disease: Secondary | ICD-10-CM | POA: Diagnosis not present

## 2022-07-25 DIAGNOSIS — Z7901 Long term (current) use of anticoagulants: Secondary | ICD-10-CM | POA: Diagnosis not present

## 2022-07-25 DIAGNOSIS — Z880 Allergy status to penicillin: Secondary | ICD-10-CM | POA: Diagnosis not present

## 2022-07-25 DIAGNOSIS — I120 Hypertensive chronic kidney disease with stage 5 chronic kidney disease or end stage renal disease: Secondary | ICD-10-CM | POA: Diagnosis not present

## 2022-07-25 DIAGNOSIS — E785 Hyperlipidemia, unspecified: Secondary | ICD-10-CM | POA: Diagnosis not present

## 2022-07-25 DIAGNOSIS — Z792 Long term (current) use of antibiotics: Secondary | ICD-10-CM | POA: Diagnosis not present

## 2022-07-30 DIAGNOSIS — E860 Dehydration: Secondary | ICD-10-CM | POA: Diagnosis not present

## 2022-07-30 DIAGNOSIS — N186 End stage renal disease: Secondary | ICD-10-CM | POA: Diagnosis not present

## 2022-07-30 DIAGNOSIS — R9431 Abnormal electrocardiogram [ECG] [EKG]: Secondary | ICD-10-CM | POA: Diagnosis not present

## 2022-07-30 DIAGNOSIS — I959 Hypotension, unspecified: Secondary | ICD-10-CM | POA: Diagnosis not present

## 2022-07-30 DIAGNOSIS — H538 Other visual disturbances: Secondary | ICD-10-CM | POA: Diagnosis not present

## 2022-07-30 DIAGNOSIS — E1122 Type 2 diabetes mellitus with diabetic chronic kidney disease: Secondary | ICD-10-CM | POA: Diagnosis not present

## 2022-07-30 DIAGNOSIS — I4510 Unspecified right bundle-branch block: Secondary | ICD-10-CM | POA: Diagnosis not present

## 2022-07-30 DIAGNOSIS — R110 Nausea: Secondary | ICD-10-CM | POA: Diagnosis not present

## 2022-07-30 DIAGNOSIS — E875 Hyperkalemia: Secondary | ICD-10-CM | POA: Diagnosis not present

## 2022-07-30 DIAGNOSIS — J9811 Atelectasis: Secondary | ICD-10-CM | POA: Diagnosis not present

## 2022-07-30 DIAGNOSIS — Z992 Dependence on renal dialysis: Secondary | ICD-10-CM | POA: Diagnosis not present

## 2022-07-30 DIAGNOSIS — R531 Weakness: Secondary | ICD-10-CM | POA: Diagnosis not present

## 2022-07-31 DIAGNOSIS — R4182 Altered mental status, unspecified: Secondary | ICD-10-CM | POA: Diagnosis not present

## 2022-07-31 DIAGNOSIS — N186 End stage renal disease: Secondary | ICD-10-CM | POA: Diagnosis not present

## 2022-07-31 DIAGNOSIS — E271 Primary adrenocortical insufficiency: Secondary | ICD-10-CM | POA: Diagnosis not present

## 2022-07-31 DIAGNOSIS — D509 Iron deficiency anemia, unspecified: Secondary | ICD-10-CM | POA: Diagnosis not present

## 2022-07-31 DIAGNOSIS — N2581 Secondary hyperparathyroidism of renal origin: Secondary | ICD-10-CM | POA: Diagnosis not present

## 2022-07-31 DIAGNOSIS — I959 Hypotension, unspecified: Secondary | ICD-10-CM | POA: Diagnosis not present

## 2022-07-31 DIAGNOSIS — D631 Anemia in chronic kidney disease: Secondary | ICD-10-CM | POA: Diagnosis not present

## 2022-07-31 DIAGNOSIS — Z992 Dependence on renal dialysis: Secondary | ICD-10-CM | POA: Diagnosis not present

## 2022-08-01 ENCOUNTER — Emergency Department (HOSPITAL_COMMUNITY)
Admission: EM | Admit: 2022-08-01 | Discharge: 2022-08-01 | Disposition: A | Discharge status: Skilled Nursing Facility | Payer: Medicare Other | Attending: Emergency Medicine | Admitting: Emergency Medicine

## 2022-08-01 ENCOUNTER — Encounter (HOSPITAL_COMMUNITY): Payer: Self-pay

## 2022-08-01 ENCOUNTER — Other Ambulatory Visit: Payer: Self-pay

## 2022-08-01 DIAGNOSIS — I120 Hypertensive chronic kidney disease with stage 5 chronic kidney disease or end stage renal disease: Secondary | ICD-10-CM | POA: Insufficient documentation

## 2022-08-01 DIAGNOSIS — N186 End stage renal disease: Secondary | ICD-10-CM | POA: Diagnosis not present

## 2022-08-01 DIAGNOSIS — R531 Weakness: Secondary | ICD-10-CM | POA: Diagnosis not present

## 2022-08-01 DIAGNOSIS — Z992 Dependence on renal dialysis: Secondary | ICD-10-CM | POA: Insufficient documentation

## 2022-08-01 DIAGNOSIS — I959 Hypotension, unspecified: Secondary | ICD-10-CM | POA: Insufficient documentation

## 2022-08-01 HISTORY — DX: Anxiety disorder, unspecified: F41.9

## 2022-08-01 HISTORY — DX: Gastro-esophageal reflux disease without esophagitis: K21.9

## 2022-08-01 HISTORY — DX: Chronic kidney disease, unspecified: N18.9

## 2022-08-01 HISTORY — DX: Type 2 diabetes mellitus without complications: E11.9

## 2022-08-01 HISTORY — DX: Diverticulitis of large intestine without perforation or abscess without bleeding: K57.32

## 2022-08-01 HISTORY — DX: Hyperlipidemia, unspecified: E78.5

## 2022-08-01 HISTORY — DX: Disorder of thyroid, unspecified: E07.9

## 2022-08-01 HISTORY — DX: Depression, unspecified: F32.A

## 2022-08-01 LAB — COMPREHENSIVE METABOLIC PANEL
ALT: 14 U/L (ref 0–44)
AST: 17 U/L (ref 15–41)
Albumin: 2.9 g/dL — ABNORMAL LOW (ref 3.5–5.0)
Alkaline Phosphatase: 96 U/L (ref 38–126)
Anion gap: 10 (ref 5–15)
BUN: 16 mg/dL (ref 8–23)
CO2: 27 mmol/L (ref 22–32)
Calcium: 8.6 mg/dL — ABNORMAL LOW (ref 8.9–10.3)
Chloride: 96 mmol/L — ABNORMAL LOW (ref 98–111)
Creatinine, Ser: 6.73 mg/dL — ABNORMAL HIGH (ref 0.61–1.24)
GFR, Estimated: 8 mL/min — ABNORMAL LOW (ref >60)
Glucose, Bld: 128 mg/dL — ABNORMAL HIGH (ref 70–99)
Potassium: 4.6 mmol/L (ref 3.5–5.1)
Sodium: 133 mmol/L — ABNORMAL LOW (ref 135–145)
Total Bilirubin: 0.7 mg/dL (ref 0.3–1.2)
Total Protein: 7 g/dL (ref 6.5–8.1)

## 2022-08-01 LAB — CBC
HCT: 35.1 % — ABNORMAL LOW (ref 39.0–52.0)
Hemoglobin: 11.3 g/dL — ABNORMAL LOW (ref 13.0–17.0)
MCH: 31.5 pg (ref 26.0–34.0)
MCHC: 32.2 g/dL (ref 30.0–36.0)
MCV: 97.8 fL (ref 80.0–100.0)
Platelets: 296 10*3/uL (ref 150–400)
RBC: 3.59 MIL/uL — ABNORMAL LOW (ref 4.22–5.81)
RDW: 16.5 % — ABNORMAL HIGH (ref 11.5–15.5)
WBC: 9.8 10*3/uL (ref 4.0–10.5)
nRBC: 0 % (ref 0.0–0.2)

## 2022-08-01 LAB — BRAIN NATRIURETIC PEPTIDE: B Natriuretic Peptide: 126.5 pg/mL — ABNORMAL HIGH (ref 0.0–100.0)

## 2022-08-01 LAB — MAGNESIUM: Magnesium: 1.4 mg/dL — ABNORMAL LOW (ref 1.7–2.4)

## 2022-08-01 MED ORDER — MIDODRINE HCL 5 MG PO TABS
5.0000 mg | ORAL_TABLET | Freq: Once | ORAL | Status: AC
  Administered 2022-08-01: 5 mg via ORAL
  Filled 2022-08-01: qty 1

## 2022-08-01 MED ORDER — MIDODRINE HCL 5 MG PO TABS: ORAL_TABLET | ORAL | 0 refills | Status: DC

## 2022-08-01 MED ORDER — MAGNESIUM OXIDE -MG SUPPLEMENT 400 (240 MG) MG PO TABS
800.0000 mg | ORAL_TABLET | Freq: Once | ORAL | Status: AC
  Administered 2022-08-01: 800 mg via ORAL
  Filled 2022-08-01: qty 2

## 2022-08-01 NOTE — ED Notes (Signed)
Ostomy bag replaced at this time.

## 2022-08-01 NOTE — ED Provider Notes (Signed)
[place] EMERGENCY DEPARTMENT AT [facility] Provider Note   CSN: [id number] Arrival date & time: 08/01/22  1235     History  Chief Complaint  Patient presents with   Hypotension    From dialysis center via EMS for hypotension. SBP 60s upon EMS arrival. 110/60 following bolus. Patient denies pain. Patient alert and oriented x 4 at this time.     Francisco Baldwin is a 67 y.o. male w/ hx of ESRD on dialysis presenting from SNF with concern for hypotension.  Per report provided by EMS the patient had a blood pressure of 60s/40s this morning after waking up.  He therefore did not start dialysis.  The patient's son who is present at the bedside reports this is been a recurring issue for the past few months, the patient has had 4 visits to the ER for similar presentation of low blood pressure.  They report the patient blood pressure is always very low in the morning, the patient often complains of lightheadedness and dizziness in the morning.  Pressures tend to improve throughout the daytime.  EMS gave 600 cc IV fluid prior to patient's arrival in the hospital.  My review of medical records patient was seen in the ED 07/23/22 at [facility] for similar concerns for hypotension, and he was not dialyzed at that time.  HPI     Home Medications Prior to Admission medications   Not on File      Allergies    Penicillins    Review of Systems   Review of Systems  Physical Exam Updated Vital Signs BP (!) 139/58   Pulse 69   Temp 98.1 F (36.7 C) (Oral)   Resp 18   Ht 5\' 6"  (1.676 m)   Wt 77.1 kg   SpO2 100%   BMI 27.44 kg/m  Physical Exam Constitutional:      General: He is not in acute distress. HENT:     Head: Normocephalic and atraumatic.  Eyes:     Conjunctiva/sclera: Conjunctivae normal.     Pupils: Pupils are equal, round, and reactive to light.  Cardiovascular:     Rate and Rhythm: Normal rate and regular rhythm.  Pulmonary:      Effort: Pulmonary effort is normal. No respiratory distress.  Abdominal:     General: There is no distension.     Tenderness: There is no abdominal tenderness.  Genitourinary:    Comments: Feeding tube in place Skin:    General: Skin is warm and dry.  Neurological:     General: No focal deficit present.     Mental Status: He is alert. Mental status is at baseline.  Psychiatric:        Mood and Affect: Mood normal.        Behavior: Behavior normal.     ED Results / Procedures / Treatments   Labs (all labs ordered are listed, but only abnormal results are displayed) Labs Reviewed  COMPREHENSIVE METABOLIC PANEL - Abnormal; Notable for the following components:      Result Value   Sodium 133 (*)    Chloride 96 (*)    Glucose, Bld 128 (*)    Creatinine, Ser 6.73 (*)    Calcium 8.6 (*)    Albumin 2.9 (*)    GFR, Estimated 8 (*)    All other components within normal limits  CBC - Abnormal; Notable for the following components:   RBC 3.59 (*)    Hemoglobin 11.3 (*)  HCT 35.1 (*)    RDW 16.5 (*)    All other components within normal limits  MAGNESIUM - Abnormal; Notable for the following components:   Magnesium 1.4 (*)    All other components within normal limits  BRAIN NATRIURETIC PEPTIDE - Abnormal; Notable for the following components:   B Natriuretic Peptide 126.5 (*)    All other components within normal limits    EKG EKG Interpretation  Date/Time:  Wednesday Aug 01 2022 12:57:55 EDT Ventricular Rate:  80 PR Interval:  133 QRS Duration: 135 QT Interval:  403 QTC Calculation: 465 R Axis:   -67 Text Interpretation: Sinus rhythm Right bundle branch block Confirmed by Alvester Chou [phone]) on 08/01/2022 2:49:27 PM  Radiology No results found.  Procedures Procedures    Medications Ordered in ED Medications  midodrine (PROAMATINE) tablet 5 mg (5 mg Oral Given 08/01/22 1347)  magnesium oxide (MAG-OX) tablet 800 mg (800 mg Oral Given 08/01/22 1458)    ED  Course/ Medical Decision Making/ A&P                             Medical Decision Making Amount and/or Complexity of Data Reviewed Labs: ordered.  Risk OTC drugs. Prescription drug management.   This patient presents to the ED with concern for hypotension, now resolved. This involves an extensive number of treatment options, and is a complaint that carries with it a high risk of complications and morbidity.  The differential diagnosis includes dysautonomia most likely, versus infection, versus dehydration, versus poor oral intake, versus other  Co-morbidities that complicate the patient evaluation: ESRD at higher risk of electrolyte derangements  Additional history obtained from son, EMS  External records from outside source obtained and reviewed including [facility] ED records  I ordered and personally interpreted labs.  The pertinent results include: No emergent findings.  Labs are largely at her baseline levels.  There is some mild hypomagnesemia with magnesium 1.4.  No significant acute anemia.  Potassium is 4.6.  There is no emergent indication for dialysis based on this presentation.  The patient is not in any respiratory distress and has no confusion.  The patient was maintained on a cardiac monitor.  I personally viewed and interpreted the cardiac monitored which showed an underlying rhythm of: normal heart rate  Per my interpretation the patient's ECG shows no acute ischemic findings  I ordered medication including midodrine trial for hypotension, oral Mg for hypoMag  I have reviewed the patients home medicines and have made adjustments as needed  Test Considered: A low suspicion for sepsis, intra-abdominal infection, acute PE.  Do not see indication for CT imaging of the chest abdomen pelvis.   After the interventions noted above, I reevaluated the patient and found that they have: improved  Blood pressure was normal and stable since the patient's arrival in the emergency  department.  Orthostatic vital signs are normal.  Please note the patient did receive 600 cc of fluid by EMS prior to his arrival  Dispostion:  Patient signed out to Dr Sharia Reeve Long EDP at 3:30 pm pending reassessment of BP after 3-4 hours observation in the ED.   If the patient remains normotensive anticipate he can be discharged back to his facility.  I would recommend consideration of prescription for midodrine, to be taken either daily, or as needed, particularly on days preceding dialysis, as the patient is having consistent low blood pressure in the morning.  This may  be related to dysautonomia or poor oral intake issues.  I do not see evidence of cardiogenic shock, sepsis, hypovolemic shock, or other indication for hospitalization at this time.         Final Clinical Impression(s) / ED Diagnoses Final diagnoses:  Hypotension, unspecified hypotension type    Rx / DC Orders ED Discharge Orders     None         Delsa Walder, Kermit Balo, MD 08/01/22 [phone]

## 2022-08-01 NOTE — ED Notes (Signed)
[facility]  

## 2022-08-01 NOTE — ED Provider Notes (Signed)
Blood pressure (!) 139/58, pulse 69, temperature 98.1 F (36.7 C), temperature source Oral, resp. rate 18, height 5\' 6"  (1.676 m), weight 77.1 kg, SpO2 100 %.  Assuming care from Dr. Renaye Rakers.  In short, Francisco Baldwin is a 67 y.o. male with a chief complaint of Hypotension (From dialysis center via EMS for hypotension. SBP 60s upon EMS arrival. 110/60 following bolus. Patient denies pain. Patient alert and oriented x 4 at this time. ) .  Refer to the original H&P for additional details.  The current plan of care is to observe in the ED. Consider Midodrine on HD days for improved BP and increase change of patient getting HD. No emergent reason for HD today.  05:10 PM  Spoke with patient and son by phone at bedside.  His blood pressures have remained within normal limits.  He is looking and feeling much better.  He does not have an indication for emergent hemodialysis, as we discussed.  Plan for midodrine to be given on the mornings of his dialysis only. Advised close PCP follow up as well. Considered admission but appears stable for discharge.    Maia Plan, MD 08/01/22 [phone]

## 2022-08-01 NOTE — ED Notes (Signed)
[facility] called. ETA approx 1900.

## 2022-08-01 NOTE — Discharge Instructions (Signed)
Please continue your dialysis as scheduled.  Please take the medication prescribed on dialysis mornings only. Return to the ED with any new or worsening symptoms.

## 2022-08-01 NOTE — ED Notes (Signed)
Unable to assess standing blood pressure due to patient's inability to stand. Per patient and family, patient can't walk or stand at baseline.

## 2022-08-02 DIAGNOSIS — M5416 Radiculopathy, lumbar region: Secondary | ICD-10-CM | POA: Diagnosis not present

## 2022-08-02 DIAGNOSIS — E039 Hypothyroidism, unspecified: Secondary | ICD-10-CM | POA: Diagnosis not present

## 2022-08-02 DIAGNOSIS — E119 Type 2 diabetes mellitus without complications: Secondary | ICD-10-CM | POA: Diagnosis not present

## 2022-08-02 DIAGNOSIS — I639 Cerebral infarction, unspecified: Secondary | ICD-10-CM | POA: Diagnosis not present

## 2022-08-03 DIAGNOSIS — D631 Anemia in chronic kidney disease: Secondary | ICD-10-CM | POA: Diagnosis not present

## 2022-08-03 DIAGNOSIS — N2581 Secondary hyperparathyroidism of renal origin: Secondary | ICD-10-CM | POA: Diagnosis not present

## 2022-08-03 DIAGNOSIS — Z992 Dependence on renal dialysis: Secondary | ICD-10-CM | POA: Diagnosis not present

## 2022-08-03 DIAGNOSIS — D509 Iron deficiency anemia, unspecified: Secondary | ICD-10-CM | POA: Diagnosis not present

## 2022-08-03 DIAGNOSIS — N186 End stage renal disease: Secondary | ICD-10-CM | POA: Diagnosis not present

## 2022-08-06 DIAGNOSIS — D509 Iron deficiency anemia, unspecified: Secondary | ICD-10-CM | POA: Diagnosis not present

## 2022-08-06 DIAGNOSIS — N186 End stage renal disease: Secondary | ICD-10-CM | POA: Diagnosis not present

## 2022-08-06 DIAGNOSIS — N2581 Secondary hyperparathyroidism of renal origin: Secondary | ICD-10-CM | POA: Diagnosis not present

## 2022-08-06 DIAGNOSIS — Z992 Dependence on renal dialysis: Secondary | ICD-10-CM | POA: Diagnosis not present

## 2022-08-06 DIAGNOSIS — D631 Anemia in chronic kidney disease: Secondary | ICD-10-CM | POA: Diagnosis not present

## 2022-08-06 DIAGNOSIS — I639 Cerebral infarction, unspecified: Secondary | ICD-10-CM | POA: Diagnosis not present

## 2022-08-06 DIAGNOSIS — I959 Hypotension, unspecified: Secondary | ICD-10-CM | POA: Diagnosis not present

## 2022-08-07 DIAGNOSIS — I959 Hypotension, unspecified: Secondary | ICD-10-CM | POA: Diagnosis not present

## 2022-08-07 DIAGNOSIS — K219 Gastro-esophageal reflux disease without esophagitis: Secondary | ICD-10-CM | POA: Diagnosis not present

## 2022-08-07 DIAGNOSIS — R9431 Abnormal electrocardiogram [ECG] [EKG]: Secondary | ICD-10-CM | POA: Diagnosis not present

## 2022-08-07 DIAGNOSIS — Z5181 Encounter for therapeutic drug level monitoring: Secondary | ICD-10-CM | POA: Diagnosis not present

## 2022-08-08 DIAGNOSIS — N2581 Secondary hyperparathyroidism of renal origin: Secondary | ICD-10-CM | POA: Diagnosis not present

## 2022-08-08 DIAGNOSIS — F4320 Adjustment disorder, unspecified: Secondary | ICD-10-CM | POA: Diagnosis not present

## 2022-08-08 DIAGNOSIS — N186 End stage renal disease: Secondary | ICD-10-CM | POA: Diagnosis not present

## 2022-08-08 DIAGNOSIS — D631 Anemia in chronic kidney disease: Secondary | ICD-10-CM | POA: Diagnosis not present

## 2022-08-08 DIAGNOSIS — Z992 Dependence on renal dialysis: Secondary | ICD-10-CM | POA: Diagnosis not present

## 2022-08-08 DIAGNOSIS — D509 Iron deficiency anemia, unspecified: Secondary | ICD-10-CM | POA: Diagnosis not present

## 2022-08-09 DIAGNOSIS — I959 Hypotension, unspecified: Secondary | ICD-10-CM | POA: Diagnosis not present

## 2022-08-09 DIAGNOSIS — Z5181 Encounter for therapeutic drug level monitoring: Secondary | ICD-10-CM | POA: Diagnosis not present

## 2022-08-09 DIAGNOSIS — Z7952 Long term (current) use of systemic steroids: Secondary | ICD-10-CM | POA: Diagnosis not present

## 2022-08-10 DIAGNOSIS — N186 End stage renal disease: Secondary | ICD-10-CM | POA: Diagnosis not present

## 2022-08-10 DIAGNOSIS — Z992 Dependence on renal dialysis: Secondary | ICD-10-CM | POA: Diagnosis not present

## 2022-08-10 DIAGNOSIS — D631 Anemia in chronic kidney disease: Secondary | ICD-10-CM | POA: Diagnosis not present

## 2022-08-10 DIAGNOSIS — N2581 Secondary hyperparathyroidism of renal origin: Secondary | ICD-10-CM | POA: Diagnosis not present

## 2022-08-10 DIAGNOSIS — D509 Iron deficiency anemia, unspecified: Secondary | ICD-10-CM | POA: Diagnosis not present

## 2022-08-13 DIAGNOSIS — N186 End stage renal disease: Secondary | ICD-10-CM | POA: Diagnosis not present

## 2022-08-13 DIAGNOSIS — D631 Anemia in chronic kidney disease: Secondary | ICD-10-CM | POA: Diagnosis not present

## 2022-08-13 DIAGNOSIS — D509 Iron deficiency anemia, unspecified: Secondary | ICD-10-CM | POA: Diagnosis not present

## 2022-08-13 DIAGNOSIS — Z992 Dependence on renal dialysis: Secondary | ICD-10-CM | POA: Diagnosis not present

## 2022-08-13 DIAGNOSIS — N2581 Secondary hyperparathyroidism of renal origin: Secondary | ICD-10-CM | POA: Diagnosis not present

## 2022-08-15 DIAGNOSIS — I509 Heart failure, unspecified: Secondary | ICD-10-CM | POA: Diagnosis not present

## 2022-08-15 DIAGNOSIS — N186 End stage renal disease: Secondary | ICD-10-CM | POA: Diagnosis not present

## 2022-08-15 DIAGNOSIS — I959 Hypotension, unspecified: Secondary | ICD-10-CM | POA: Diagnosis not present

## 2022-08-15 DIAGNOSIS — D631 Anemia in chronic kidney disease: Secondary | ICD-10-CM | POA: Diagnosis not present

## 2022-08-15 DIAGNOSIS — Z992 Dependence on renal dialysis: Secondary | ICD-10-CM | POA: Diagnosis not present

## 2022-08-15 DIAGNOSIS — N2581 Secondary hyperparathyroidism of renal origin: Secondary | ICD-10-CM | POA: Diagnosis not present

## 2022-08-15 DIAGNOSIS — D509 Iron deficiency anemia, unspecified: Secondary | ICD-10-CM | POA: Diagnosis not present

## 2022-08-17 DIAGNOSIS — D631 Anemia in chronic kidney disease: Secondary | ICD-10-CM | POA: Diagnosis not present

## 2022-08-17 DIAGNOSIS — N186 End stage renal disease: Secondary | ICD-10-CM | POA: Diagnosis not present

## 2022-08-17 DIAGNOSIS — Z992 Dependence on renal dialysis: Secondary | ICD-10-CM | POA: Diagnosis not present

## 2022-08-17 DIAGNOSIS — D509 Iron deficiency anemia, unspecified: Secondary | ICD-10-CM | POA: Diagnosis not present

## 2022-08-17 DIAGNOSIS — N2581 Secondary hyperparathyroidism of renal origin: Secondary | ICD-10-CM | POA: Diagnosis not present

## 2022-08-20 DIAGNOSIS — D631 Anemia in chronic kidney disease: Secondary | ICD-10-CM | POA: Diagnosis not present

## 2022-08-20 DIAGNOSIS — Z992 Dependence on renal dialysis: Secondary | ICD-10-CM | POA: Diagnosis not present

## 2022-08-20 DIAGNOSIS — D509 Iron deficiency anemia, unspecified: Secondary | ICD-10-CM | POA: Diagnosis not present

## 2022-08-20 DIAGNOSIS — N2581 Secondary hyperparathyroidism of renal origin: Secondary | ICD-10-CM | POA: Diagnosis not present

## 2022-08-20 DIAGNOSIS — N186 End stage renal disease: Secondary | ICD-10-CM | POA: Diagnosis not present

## 2022-08-21 ENCOUNTER — Encounter: Payer: Self-pay | Admitting: Cardiology

## 2022-08-21 DIAGNOSIS — E119 Type 2 diabetes mellitus without complications: Secondary | ICD-10-CM | POA: Insufficient documentation

## 2022-08-21 DIAGNOSIS — E559 Vitamin D deficiency, unspecified: Secondary | ICD-10-CM | POA: Insufficient documentation

## 2022-08-22 DIAGNOSIS — N186 End stage renal disease: Secondary | ICD-10-CM | POA: Diagnosis not present

## 2022-08-22 DIAGNOSIS — D509 Iron deficiency anemia, unspecified: Secondary | ICD-10-CM | POA: Diagnosis not present

## 2022-08-22 DIAGNOSIS — D631 Anemia in chronic kidney disease: Secondary | ICD-10-CM | POA: Diagnosis not present

## 2022-08-22 DIAGNOSIS — R34 Anuria and oliguria: Secondary | ICD-10-CM | POA: Diagnosis not present

## 2022-08-22 DIAGNOSIS — N3289 Other specified disorders of bladder: Secondary | ICD-10-CM | POA: Diagnosis not present

## 2022-08-22 DIAGNOSIS — Z992 Dependence on renal dialysis: Secondary | ICD-10-CM | POA: Diagnosis not present

## 2022-08-22 DIAGNOSIS — N2581 Secondary hyperparathyroidism of renal origin: Secondary | ICD-10-CM | POA: Diagnosis not present

## 2022-08-24 DIAGNOSIS — D509 Iron deficiency anemia, unspecified: Secondary | ICD-10-CM | POA: Diagnosis not present

## 2022-08-24 DIAGNOSIS — N2581 Secondary hyperparathyroidism of renal origin: Secondary | ICD-10-CM | POA: Diagnosis not present

## 2022-08-24 DIAGNOSIS — D631 Anemia in chronic kidney disease: Secondary | ICD-10-CM | POA: Diagnosis not present

## 2022-08-24 DIAGNOSIS — Z992 Dependence on renal dialysis: Secondary | ICD-10-CM | POA: Diagnosis not present

## 2022-08-24 DIAGNOSIS — N186 End stage renal disease: Secondary | ICD-10-CM | POA: Diagnosis not present

## 2022-08-25 DIAGNOSIS — E1122 Type 2 diabetes mellitus with diabetic chronic kidney disease: Secondary | ICD-10-CM | POA: Diagnosis not present

## 2022-08-25 DIAGNOSIS — N186 End stage renal disease: Secondary | ICD-10-CM | POA: Diagnosis not present

## 2022-08-25 DIAGNOSIS — Z992 Dependence on renal dialysis: Secondary | ICD-10-CM | POA: Diagnosis not present

## 2022-08-27 DIAGNOSIS — D631 Anemia in chronic kidney disease: Secondary | ICD-10-CM | POA: Diagnosis not present

## 2022-08-27 DIAGNOSIS — N186 End stage renal disease: Secondary | ICD-10-CM | POA: Diagnosis not present

## 2022-08-27 DIAGNOSIS — Z992 Dependence on renal dialysis: Secondary | ICD-10-CM | POA: Diagnosis not present

## 2022-08-27 DIAGNOSIS — D509 Iron deficiency anemia, unspecified: Secondary | ICD-10-CM | POA: Diagnosis not present

## 2022-08-27 DIAGNOSIS — N2581 Secondary hyperparathyroidism of renal origin: Secondary | ICD-10-CM | POA: Diagnosis not present

## 2022-08-29 DIAGNOSIS — Z992 Dependence on renal dialysis: Secondary | ICD-10-CM | POA: Diagnosis not present

## 2022-08-29 DIAGNOSIS — I639 Cerebral infarction, unspecified: Secondary | ICD-10-CM | POA: Diagnosis not present

## 2022-08-29 DIAGNOSIS — E039 Hypothyroidism, unspecified: Secondary | ICD-10-CM | POA: Diagnosis not present

## 2022-08-29 DIAGNOSIS — E1122 Type 2 diabetes mellitus with diabetic chronic kidney disease: Secondary | ICD-10-CM | POA: Diagnosis not present

## 2022-08-29 DIAGNOSIS — N186 End stage renal disease: Secondary | ICD-10-CM | POA: Diagnosis not present

## 2022-08-29 DIAGNOSIS — E119 Type 2 diabetes mellitus without complications: Secondary | ICD-10-CM | POA: Diagnosis not present

## 2022-08-29 DIAGNOSIS — D509 Iron deficiency anemia, unspecified: Secondary | ICD-10-CM | POA: Diagnosis not present

## 2022-08-29 DIAGNOSIS — N2581 Secondary hyperparathyroidism of renal origin: Secondary | ICD-10-CM | POA: Diagnosis not present

## 2022-08-29 DIAGNOSIS — M5410 Radiculopathy, site unspecified: Secondary | ICD-10-CM | POA: Diagnosis not present

## 2022-08-29 DIAGNOSIS — D631 Anemia in chronic kidney disease: Secondary | ICD-10-CM | POA: Diagnosis not present

## 2022-08-31 DIAGNOSIS — D509 Iron deficiency anemia, unspecified: Secondary | ICD-10-CM | POA: Diagnosis not present

## 2022-08-31 DIAGNOSIS — D631 Anemia in chronic kidney disease: Secondary | ICD-10-CM | POA: Diagnosis not present

## 2022-08-31 DIAGNOSIS — N2581 Secondary hyperparathyroidism of renal origin: Secondary | ICD-10-CM | POA: Diagnosis not present

## 2022-08-31 DIAGNOSIS — N186 End stage renal disease: Secondary | ICD-10-CM | POA: Diagnosis not present

## 2022-08-31 DIAGNOSIS — Z992 Dependence on renal dialysis: Secondary | ICD-10-CM | POA: Diagnosis not present

## 2022-09-03 DIAGNOSIS — N186 End stage renal disease: Secondary | ICD-10-CM | POA: Diagnosis not present

## 2022-09-03 DIAGNOSIS — D509 Iron deficiency anemia, unspecified: Secondary | ICD-10-CM | POA: Diagnosis not present

## 2022-09-03 DIAGNOSIS — D631 Anemia in chronic kidney disease: Secondary | ICD-10-CM | POA: Diagnosis not present

## 2022-09-03 DIAGNOSIS — Z992 Dependence on renal dialysis: Secondary | ICD-10-CM | POA: Diagnosis not present

## 2022-09-03 DIAGNOSIS — N2581 Secondary hyperparathyroidism of renal origin: Secondary | ICD-10-CM | POA: Diagnosis not present

## 2022-09-04 DIAGNOSIS — E039 Hypothyroidism, unspecified: Secondary | ICD-10-CM | POA: Diagnosis not present

## 2022-09-04 DIAGNOSIS — Z5181 Encounter for therapeutic drug level monitoring: Secondary | ICD-10-CM | POA: Diagnosis not present

## 2022-09-05 DIAGNOSIS — N2581 Secondary hyperparathyroidism of renal origin: Secondary | ICD-10-CM | POA: Diagnosis not present

## 2022-09-05 DIAGNOSIS — Z992 Dependence on renal dialysis: Secondary | ICD-10-CM | POA: Diagnosis not present

## 2022-09-05 DIAGNOSIS — D509 Iron deficiency anemia, unspecified: Secondary | ICD-10-CM | POA: Diagnosis not present

## 2022-09-05 DIAGNOSIS — D631 Anemia in chronic kidney disease: Secondary | ICD-10-CM | POA: Diagnosis not present

## 2022-09-05 DIAGNOSIS — N186 End stage renal disease: Secondary | ICD-10-CM | POA: Diagnosis not present

## 2022-09-07 DIAGNOSIS — D631 Anemia in chronic kidney disease: Secondary | ICD-10-CM | POA: Diagnosis not present

## 2022-09-07 DIAGNOSIS — D509 Iron deficiency anemia, unspecified: Secondary | ICD-10-CM | POA: Diagnosis not present

## 2022-09-07 DIAGNOSIS — Z992 Dependence on renal dialysis: Secondary | ICD-10-CM | POA: Diagnosis not present

## 2022-09-07 DIAGNOSIS — N2581 Secondary hyperparathyroidism of renal origin: Secondary | ICD-10-CM | POA: Diagnosis not present

## 2022-09-07 DIAGNOSIS — N186 End stage renal disease: Secondary | ICD-10-CM | POA: Diagnosis not present

## 2022-09-10 DIAGNOSIS — N2581 Secondary hyperparathyroidism of renal origin: Secondary | ICD-10-CM | POA: Diagnosis not present

## 2022-09-10 DIAGNOSIS — D631 Anemia in chronic kidney disease: Secondary | ICD-10-CM | POA: Diagnosis not present

## 2022-09-10 DIAGNOSIS — D509 Iron deficiency anemia, unspecified: Secondary | ICD-10-CM | POA: Diagnosis not present

## 2022-09-10 DIAGNOSIS — N186 End stage renal disease: Secondary | ICD-10-CM | POA: Diagnosis not present

## 2022-09-10 DIAGNOSIS — Z992 Dependence on renal dialysis: Secondary | ICD-10-CM | POA: Diagnosis not present

## 2022-09-12 DIAGNOSIS — D509 Iron deficiency anemia, unspecified: Secondary | ICD-10-CM | POA: Diagnosis not present

## 2022-09-12 DIAGNOSIS — N2581 Secondary hyperparathyroidism of renal origin: Secondary | ICD-10-CM | POA: Diagnosis not present

## 2022-09-12 DIAGNOSIS — D631 Anemia in chronic kidney disease: Secondary | ICD-10-CM | POA: Diagnosis not present

## 2022-09-12 DIAGNOSIS — Z992 Dependence on renal dialysis: Secondary | ICD-10-CM | POA: Diagnosis not present

## 2022-09-12 DIAGNOSIS — N186 End stage renal disease: Secondary | ICD-10-CM | POA: Diagnosis not present

## 2022-09-14 DIAGNOSIS — N2581 Secondary hyperparathyroidism of renal origin: Secondary | ICD-10-CM | POA: Diagnosis not present

## 2022-09-14 DIAGNOSIS — D509 Iron deficiency anemia, unspecified: Secondary | ICD-10-CM | POA: Diagnosis not present

## 2022-09-14 DIAGNOSIS — N186 End stage renal disease: Secondary | ICD-10-CM | POA: Diagnosis not present

## 2022-09-14 DIAGNOSIS — D631 Anemia in chronic kidney disease: Secondary | ICD-10-CM | POA: Diagnosis not present

## 2022-09-14 DIAGNOSIS — Z992 Dependence on renal dialysis: Secondary | ICD-10-CM | POA: Diagnosis not present

## 2022-09-17 DIAGNOSIS — D631 Anemia in chronic kidney disease: Secondary | ICD-10-CM | POA: Diagnosis not present

## 2022-09-17 DIAGNOSIS — Z992 Dependence on renal dialysis: Secondary | ICD-10-CM | POA: Diagnosis not present

## 2022-09-17 DIAGNOSIS — D509 Iron deficiency anemia, unspecified: Secondary | ICD-10-CM | POA: Diagnosis not present

## 2022-09-17 DIAGNOSIS — N186 End stage renal disease: Secondary | ICD-10-CM | POA: Diagnosis not present

## 2022-09-17 DIAGNOSIS — N2581 Secondary hyperparathyroidism of renal origin: Secondary | ICD-10-CM | POA: Diagnosis not present

## 2022-09-19 DIAGNOSIS — N186 End stage renal disease: Secondary | ICD-10-CM | POA: Diagnosis not present

## 2022-09-19 DIAGNOSIS — D509 Iron deficiency anemia, unspecified: Secondary | ICD-10-CM | POA: Diagnosis not present

## 2022-09-19 DIAGNOSIS — Z992 Dependence on renal dialysis: Secondary | ICD-10-CM | POA: Diagnosis not present

## 2022-09-19 DIAGNOSIS — N2581 Secondary hyperparathyroidism of renal origin: Secondary | ICD-10-CM | POA: Diagnosis not present

## 2022-09-19 DIAGNOSIS — D631 Anemia in chronic kidney disease: Secondary | ICD-10-CM | POA: Diagnosis not present

## 2022-09-21 DIAGNOSIS — D509 Iron deficiency anemia, unspecified: Secondary | ICD-10-CM | POA: Diagnosis not present

## 2022-09-21 DIAGNOSIS — D631 Anemia in chronic kidney disease: Secondary | ICD-10-CM | POA: Diagnosis not present

## 2022-09-21 DIAGNOSIS — N2581 Secondary hyperparathyroidism of renal origin: Secondary | ICD-10-CM | POA: Diagnosis not present

## 2022-09-21 DIAGNOSIS — N186 End stage renal disease: Secondary | ICD-10-CM | POA: Diagnosis not present

## 2022-09-21 DIAGNOSIS — Z992 Dependence on renal dialysis: Secondary | ICD-10-CM | POA: Diagnosis not present

## 2022-09-24 DIAGNOSIS — E1122 Type 2 diabetes mellitus with diabetic chronic kidney disease: Secondary | ICD-10-CM | POA: Diagnosis not present

## 2022-09-24 DIAGNOSIS — N186 End stage renal disease: Secondary | ICD-10-CM | POA: Diagnosis not present

## 2022-09-24 DIAGNOSIS — D631 Anemia in chronic kidney disease: Secondary | ICD-10-CM | POA: Diagnosis not present

## 2022-09-24 DIAGNOSIS — Z992 Dependence on renal dialysis: Secondary | ICD-10-CM | POA: Diagnosis not present

## 2022-09-24 DIAGNOSIS — N2581 Secondary hyperparathyroidism of renal origin: Secondary | ICD-10-CM | POA: Diagnosis not present

## 2022-09-24 DIAGNOSIS — D509 Iron deficiency anemia, unspecified: Secondary | ICD-10-CM | POA: Diagnosis not present

## 2022-09-25 ENCOUNTER — Ambulatory Visit: Payer: Medicare Other | Attending: Cardiology | Admitting: Cardiology

## 2022-09-25 ENCOUNTER — Encounter: Payer: Self-pay | Admitting: Cardiology

## 2022-09-25 ENCOUNTER — Telehealth: Payer: Self-pay | Admitting: Cardiology

## 2022-09-25 VITALS — BP 90/58 | HR 85 | Ht 65.0 in | Wt 163.0 lb

## 2022-09-25 DIAGNOSIS — K7460 Unspecified cirrhosis of liver: Secondary | ICD-10-CM | POA: Diagnosis not present

## 2022-09-25 DIAGNOSIS — I959 Hypotension, unspecified: Secondary | ICD-10-CM | POA: Insufficient documentation

## 2022-09-25 DIAGNOSIS — I509 Heart failure, unspecified: Secondary | ICD-10-CM | POA: Diagnosis not present

## 2022-09-25 DIAGNOSIS — R079 Chest pain, unspecified: Secondary | ICD-10-CM

## 2022-09-25 DIAGNOSIS — R5383 Other fatigue: Secondary | ICD-10-CM | POA: Diagnosis not present

## 2022-09-25 DIAGNOSIS — E782 Mixed hyperlipidemia: Secondary | ICD-10-CM

## 2022-09-25 DIAGNOSIS — I953 Hypotension of hemodialysis: Secondary | ICD-10-CM

## 2022-09-25 DIAGNOSIS — N186 End stage renal disease: Secondary | ICD-10-CM

## 2022-09-25 DIAGNOSIS — I10 Essential (primary) hypertension: Secondary | ICD-10-CM

## 2022-09-25 NOTE — Progress Notes (Signed)
Cardiology Consultation:    Date:  09/25/2022   ID:  Francisco Baldwin, DOB Apr 07, 1955, MRN [id number]  PCP:  Oneita Hurt, No  Cardiologist:  Gypsy Balsam, MD   Referring MD: Merrilyn Puma, DO   Chief Complaint  Patient presents with   Hypotension    1 month    History of Present Illness:    Francisco Baldwin is a 67 y.o. male who is being seen today for the evaluation of hypotension at the request of Merrilyn Puma, DO.  Patient with very complex past medical history.  He does have chronic kidney failure he is on dialysis hemodialysis, additional problem include longstanding diabetes, longstanding hypertension, dyslipidemia, he did have peritonitis and ended up having colostomy, does not smoke never did originally from [place], was referred to [place] because he was noted to have low blood pressure.  He said during dialysis sometimes when he came for dialysis dialysis could not be done because of low blood pressure.  He ended up going to the emergency room few times every single time workup has been negative except obviously for low blood pressure finally he was put on midodrine and seems to be doing a little better.  He does have any chest pain tightness squeezing pressure burning chest.  He does feel a little since he is being in the hospital with his abdominal catastrophe required surgery he is doing very poorly in terms of ability to exercise.  He walks around with a walker he use wheelchair.  Described to have weakness fatigue dizziness but not to the point of passing out.  Past Medical History:  Diagnosis Date   Acute respiratory failure with hypercapnia (HCC)    Anemia in other chronic diseases classified elsewhere    Anxiety    Cerebral infarction (HCC)    Chronic kidney disease    Dependence on renal dialysis (HCC)    Depression    Diabetes mellitus without complication (HCC)    Diverticulitis large intestine w/o perforation or abscess w/o bleeding    Dysphagia    End stage  renal disease (HCC)    Essential (primary) hypertension    GERD (gastroesophageal reflux disease)    Hyperlipidemia    Ileostomy status (HCC)    Metabolic encephalopathy    Personal history of other venous thrombosis and embolism    Presence of other vascular implants and grafts    Radiculopathy    Lumbar region   Thyroid disease    Type 2 diabetes mellitus with diabetic chronic kidney disease (HCC)     Past Surgical History:  Procedure Laterality Date   DIALYSIS/PERMA CATHETER REMOVAL     EXPLORATORY LAPAROTOMY     Procedure: LAPAROTOMY EXPLORATORY, SIGMOID BOWEL RESECTION, OPEN ABDOMEN; Surgeon: Leda Min, MD; Location: [facility] MAIN OR; Service: Trauma; Laterality: N/A;   EXPLORATORY LAPAROTOMY     Procedure: RE-EXPLORATION LAPAROTOMY, diverting loop ileostomy, rectosigmoid anastomosis; Surgeon: Leda Min, MD; Location: [facility] MAIN OR; Service: Trauma; Laterality: N/A;   EYE SURGERY Bilateral    Due to diabetes not cataracs   LAPAROSCOPIC INSERTION PERITONEAL CATHETER      Current Medications: Current Meds  Medication Sig   amLODipine (NORVASC) 5 MG tablet Take 5 mg by mouth daily.   apixaban (ELIQUIS) 2.5 MG TABS tablet Take 2.5 mg by mouth 2 (two) times daily.   Ascorbic Acid (VITAMIN C) 500 MG CAPS Take 500 mg by mouth daily.   collagenase (SANTYL) 250 UNIT/GM ointment Apply 1 Application topically daily.  Insulin Glargine Solostar (LANTUS) 100 UNIT/ML Solostar Pen Inject 30 Units into the skin daily. Hold if blood sugar less than 150   insulin lispro (HUMALOG KWIKPEN) 100 UNIT/ML KwikPen Inject 5 Units into the skin 4 (four) times daily - after meals and at bedtime.   ipratropium-albuterol (DUONEB) 0.5-2.5 (3) MG/3ML SOLN Take 3 mLs by nebulization every 4 (four) hours as needed (shortness of breath or wheezing).   levothyroxine (SYNTHROID) 100 MCG tablet Take 50 mcg by mouth daily before breakfast.   midodrine (PROAMATINE) 10 MG tablet Take 10 mg by  mouth 3 (three) times a week. M, W, F   Nutritional Supplements (FEEDING SUPPLEMENT, NEPRO CARB STEADY,) LIQD Take 237,240 mLs by mouth 2 (two) times daily.   ondansetron (ZOFRAN) 4 MG tablet Take 4 mg by mouth every 8 (eight) hours as needed for nausea or vomiting.   OXYBUTYNIN CHLORIDE PO Take 5 mg by mouth in the morning and at bedtime.   pantoprazole (PROTONIX) 40 MG tablet Take 40 mg by mouth every 12 (twelve) hours as needed (GERD).   predniSONE (DELTASONE) 2.5 MG tablet Take 2.5 mg by mouth daily with breakfast.   sevelamer carbonate (RENVELA) 2.4 g PACK Take 2.4 g by mouth 3 (three) times daily with meals.   [DISCONTINUED] SEVELAMER HCL PO Take 2.4 mg by mouth 3 (three) times daily with meals.     Allergies:   Penicillins   Social History   Socioeconomic History   Marital status: Married    Spouse name: Not on file   Number of children: Not on file   Years of education: Not on file   Highest education level: Not on file  Occupational History   Not on file  Tobacco Use   Smoking status: Former    Types: Cigarettes   Smokeless tobacco: Never  Substance and Sexual Activity   Alcohol use: Not Currently   Drug use: Not on file   Sexual activity: Not on file  Other Topics Concern   Not on file  Social History Narrative   Not on file   Social Determinants of Health   Financial Resource Strain: Not on file  Food Insecurity: Not on file  Transportation Needs: Not on file  Physical Activity: Not on file  Stress: Not on file  Social Connections: Not on file     Family History: The patient's family history includes Diabetes in his father and mother; Hypertension in his mother. ROS:   Please see the history of present illness.    All 14 point review of systems negative except as described per history of present illness.  EKGs/Labs/Other Studies Reviewed:    The following studies were reviewed today:   EKG:     Normal sinus rhythm, normal P interval, right bundle  branch block  Recent Labs: 08/01/2022: ALT 14; B Natriuretic Peptide 126.5; BUN 16; Creatinine, Ser 6.73; Hemoglobin 11.3; Magnesium 1.4; Platelets 296; Potassium 4.6; Sodium 133  Recent Lipid Panel No results found for: "CHOL", "TRIG", "HDL", "CHOLHDL", "VLDL", "LDLCALC", "LDLDIRECT"  Physical Exam:    VS:  BP (!) 90/58 (BP Location: Left Arm, Patient Position: Sitting)   Pulse 85   Ht 5\' 5"  (1.651 m)   Wt 163 lb (73.9 kg)   SpO2 92%   BMI 27.12 kg/m     Wt Readings from Last 3 Encounters:  09/25/22 163 lb (73.9 kg)  08/08/22 159 lb (72.1 kg)  08/01/22 170 lb (77.1 kg)     GEN:  Well nourished,  well developed in no acute distress HEENT: Normal NECK: No JVD; No carotid bruits LYMPHATICS: No lymphadenopathy CARDIAC: RRR, no murmurs, no rubs, no gallops RESPIRATORY:  Clear to auscultation without rales, wheezing or rhonchi  ABDOMEN: Soft, non-tender, non-distended MUSCULOSKELETAL:  No edema; No deformity  SKIN: Warm and dry NEUROLOGIC:  Alert and oriented x 3 PSYCHIATRIC:  Normal affect   ASSESSMENT:    1. Chest pain in adult   2. Hemodialysis-associated hypotension   3. Hepatic cirrhosis, unspecified hepatic cirrhosis type, unspecified whether ascites present (HCC)   4. Benign essential hypertension   5. End stage renal disease (HCC)   6. Mixed hyperlipidemia   7. Fatigue, unspecified type    PLAN:    In order of problems listed above:  Hypotension: I will ask him to have TSH checked, will schedule him to have echocardiogram to assess left ventricle ejection fraction.  He is taking midodrine seem to especially by attention to taking midodrine about half an hour before his dialysis. Essential hypertension again we facing opposite problem blood pressure being low. Chronic kidney failure on dialysis apparently he was evaluated for kidney [facility] by [facility] but echocardiogram being done and after that there is no follow-up we will try to investigate what the problem  is. Mixed dyslipidemia I do see his cholesterol from February with LDL 64 HDL only 8.  That happened when patient was very sick.  Will make arrangements in the future to recheck it.   Medication Adjustments/Labs and Tests Ordered: Current medicines are reviewed at length with the patient today.  Concerns regarding medicines are outlined above.  Orders Placed This Encounter  Procedures   Thyroid Panel With TSH   EKG 12-Lead   ECHOCARDIOGRAM COMPLETE   No orders of the defined types were placed in this encounter.   Signed, Georgeanna Lea, MD, [facility]. 09/25/2022 12:12 PM    [facility]

## 2022-09-25 NOTE — Patient Instructions (Signed)
Medication Instructions:  Your physician recommends that you continue on your current medications as directed. Please refer to the Current Medication list given to you today.  *If you need a refill on your cardiac medications before your next appointment, please call your pharmacy*   Lab Work: Thyroid panel, TSH- today If you have labs (blood work) drawn today and your tests are completely normal, you will receive your results only by: MyChart Message (if you have MyChart) OR A paper copy in the mail If you have any lab test that is abnormal or we need to change your treatment, we will call you to review the results.   Testing/Procedures: Your physician has requested that you have an echocardiogram. Echocardiography is a painless test that uses sound waves to create images of your heart. It provides your doctor with information about the size and shape of your heart and how well your heart's chambers and valves are working. This procedure takes approximately one hour. There are no restrictions for this procedure. Please do NOT wear cologne, perfume, aftershave, or lotions (deodorant is allowed). Please arrive 15 minutes prior to your appointment time.    Follow-Up: At [facility], you and your health needs are our priority.  As part of our continuing mission to provide you with exceptional heart care, we have created designated Provider Care Teams.  These Care Teams include your primary Cardiologist (physician) and Advanced Practice Providers (APPs -  Physician Assistants and Nurse Practitioners) who all work together to provide you with the care you need, when you need it.  We recommend signing up for the patient portal called "MyChart".  Sign up information is provided on this After Visit Summary.  MyChart is used to connect with patients for Virtual Visits (Telemedicine).  Patients are able to view lab/test results, encounter notes, upcoming appointments, etc.  Non-urgent messages can be  sent to your provider as well.   To learn more about what you can do with MyChart, go to [email or web address].    Your next appointment:   6 week(s)  The format for your next appointment:   In Person  Provider:   Gypsy Balsam, MD    Other Instructions NA

## 2022-09-25 NOTE — Telephone Encounter (Signed)
Pt's daughter is requesting a callback regarding f/u appt that was made today in the office. She stated she just noticed it happened to fall on a date he has Dialysis so she'd like to know before its canceled, is there a way the ECHO results could be discussed over the phone with MD instead of pt having to come in the office on 12/19/2022. Please advise

## 2022-09-26 DIAGNOSIS — E119 Type 2 diabetes mellitus without complications: Secondary | ICD-10-CM | POA: Diagnosis not present

## 2022-09-26 DIAGNOSIS — E039 Hypothyroidism, unspecified: Secondary | ICD-10-CM | POA: Diagnosis not present

## 2022-09-26 DIAGNOSIS — N2581 Secondary hyperparathyroidism of renal origin: Secondary | ICD-10-CM | POA: Diagnosis not present

## 2022-09-26 DIAGNOSIS — D631 Anemia in chronic kidney disease: Secondary | ICD-10-CM | POA: Diagnosis not present

## 2022-09-26 DIAGNOSIS — M5416 Radiculopathy, lumbar region: Secondary | ICD-10-CM | POA: Diagnosis not present

## 2022-09-26 DIAGNOSIS — D509 Iron deficiency anemia, unspecified: Secondary | ICD-10-CM | POA: Diagnosis not present

## 2022-09-26 DIAGNOSIS — I639 Cerebral infarction, unspecified: Secondary | ICD-10-CM | POA: Diagnosis not present

## 2022-09-26 DIAGNOSIS — Z992 Dependence on renal dialysis: Secondary | ICD-10-CM | POA: Diagnosis not present

## 2022-09-26 DIAGNOSIS — N186 End stage renal disease: Secondary | ICD-10-CM | POA: Diagnosis not present

## 2022-09-26 LAB — THYROID PANEL WITH TSH
Free Thyroxine Index: 2.6 (ref 1.2–4.9)
T3 Uptake Ratio: 34 % (ref 24–39)
T4, Total: 7.5 ug/dL (ref 4.5–12.0)
TSH: 6.24 u[IU]/mL — ABNORMAL HIGH (ref 0.450–4.500)

## 2022-09-28 ENCOUNTER — Telehealth: Payer: Self-pay

## 2022-09-28 DIAGNOSIS — Z992 Dependence on renal dialysis: Secondary | ICD-10-CM | POA: Diagnosis not present

## 2022-09-28 DIAGNOSIS — E039 Hypothyroidism, unspecified: Secondary | ICD-10-CM

## 2022-09-28 DIAGNOSIS — N186 End stage renal disease: Secondary | ICD-10-CM | POA: Diagnosis not present

## 2022-09-28 DIAGNOSIS — D631 Anemia in chronic kidney disease: Secondary | ICD-10-CM | POA: Diagnosis not present

## 2022-09-28 DIAGNOSIS — N2581 Secondary hyperparathyroidism of renal origin: Secondary | ICD-10-CM | POA: Diagnosis not present

## 2022-09-28 DIAGNOSIS — D509 Iron deficiency anemia, unspecified: Secondary | ICD-10-CM | POA: Diagnosis not present

## 2022-09-28 NOTE — Telephone Encounter (Signed)
-----   Message from Georgeanna Lea, MD sent at 09/28/2022 10:41 AM EDT ----- There is evidence of hypothyroidism, please send a copy to primary care physician, look like he takes 50 mcg of Synthroid.  Please increase to 75

## 2022-09-28 NOTE — Telephone Encounter (Signed)
Appointment reschedule with Francisco Baldwin on 11/01/2022. Patient appointments need to be arrange on Tuesday's and Thursday's

## 2022-09-28 NOTE — Telephone Encounter (Signed)
Patient notified of results, states he is on daily. Message sent to Dr. Kirtland Bouchard for further instructions. Results sent to PCP.

## 2022-10-01 ENCOUNTER — Other Ambulatory Visit: Payer: Self-pay | Admitting: *Deleted

## 2022-10-01 DIAGNOSIS — N2581 Secondary hyperparathyroidism of renal origin: Secondary | ICD-10-CM | POA: Diagnosis not present

## 2022-10-01 DIAGNOSIS — Z992 Dependence on renal dialysis: Secondary | ICD-10-CM | POA: Diagnosis not present

## 2022-10-01 DIAGNOSIS — D631 Anemia in chronic kidney disease: Secondary | ICD-10-CM | POA: Diagnosis not present

## 2022-10-01 DIAGNOSIS — D509 Iron deficiency anemia, unspecified: Secondary | ICD-10-CM | POA: Diagnosis not present

## 2022-10-01 DIAGNOSIS — N186 End stage renal disease: Secondary | ICD-10-CM | POA: Diagnosis not present

## 2022-10-02 DIAGNOSIS — I6389 Other cerebral infarction: Secondary | ICD-10-CM | POA: Diagnosis not present

## 2022-10-03 DIAGNOSIS — N2581 Secondary hyperparathyroidism of renal origin: Secondary | ICD-10-CM | POA: Diagnosis not present

## 2022-10-03 DIAGNOSIS — D631 Anemia in chronic kidney disease: Secondary | ICD-10-CM | POA: Diagnosis not present

## 2022-10-03 DIAGNOSIS — D509 Iron deficiency anemia, unspecified: Secondary | ICD-10-CM | POA: Diagnosis not present

## 2022-10-03 DIAGNOSIS — N186 End stage renal disease: Secondary | ICD-10-CM | POA: Diagnosis not present

## 2022-10-03 DIAGNOSIS — Z992 Dependence on renal dialysis: Secondary | ICD-10-CM | POA: Diagnosis not present

## 2022-10-04 DIAGNOSIS — I6389 Other cerebral infarction: Secondary | ICD-10-CM | POA: Diagnosis not present

## 2022-10-05 DIAGNOSIS — N2581 Secondary hyperparathyroidism of renal origin: Secondary | ICD-10-CM | POA: Diagnosis not present

## 2022-10-05 DIAGNOSIS — D509 Iron deficiency anemia, unspecified: Secondary | ICD-10-CM | POA: Diagnosis not present

## 2022-10-05 DIAGNOSIS — Z992 Dependence on renal dialysis: Secondary | ICD-10-CM | POA: Diagnosis not present

## 2022-10-05 DIAGNOSIS — N186 End stage renal disease: Secondary | ICD-10-CM | POA: Diagnosis not present

## 2022-10-05 DIAGNOSIS — D631 Anemia in chronic kidney disease: Secondary | ICD-10-CM | POA: Diagnosis not present

## 2022-10-08 DIAGNOSIS — N186 End stage renal disease: Secondary | ICD-10-CM | POA: Diagnosis not present

## 2022-10-08 DIAGNOSIS — D631 Anemia in chronic kidney disease: Secondary | ICD-10-CM | POA: Diagnosis not present

## 2022-10-08 DIAGNOSIS — N2581 Secondary hyperparathyroidism of renal origin: Secondary | ICD-10-CM | POA: Diagnosis not present

## 2022-10-08 DIAGNOSIS — D509 Iron deficiency anemia, unspecified: Secondary | ICD-10-CM | POA: Diagnosis not present

## 2022-10-08 DIAGNOSIS — Z992 Dependence on renal dialysis: Secondary | ICD-10-CM | POA: Diagnosis not present

## 2022-10-08 NOTE — Addendum Note (Signed)
Addended by: Heywood Bene on: 10/08/2022 01:40 PM   Modules accepted: Orders

## 2022-10-08 NOTE — Telephone Encounter (Signed)
Spoke with Curt Bears, notified of the following per Dr. Othelia Pulling requested for a  referral to PCP. Order on file for [facility] in [place] per Dr. Kirtland Bouchard. Patient has already refilled his current dose of levothyroxine(140mcg).

## 2022-10-09 DIAGNOSIS — E875 Hyperkalemia: Secondary | ICD-10-CM | POA: Diagnosis not present

## 2022-10-09 DIAGNOSIS — R55 Syncope and collapse: Secondary | ICD-10-CM | POA: Diagnosis not present

## 2022-10-09 DIAGNOSIS — I959 Hypotension, unspecified: Secondary | ICD-10-CM | POA: Diagnosis not present

## 2022-10-09 DIAGNOSIS — I700 Atherosclerosis of aorta: Secondary | ICD-10-CM | POA: Diagnosis not present

## 2022-10-09 DIAGNOSIS — R110 Nausea: Secondary | ICD-10-CM | POA: Diagnosis not present

## 2022-10-09 DIAGNOSIS — E860 Dehydration: Secondary | ICD-10-CM | POA: Diagnosis not present

## 2022-10-09 DIAGNOSIS — N186 End stage renal disease: Secondary | ICD-10-CM | POA: Diagnosis not present

## 2022-10-09 DIAGNOSIS — I4510 Unspecified right bundle-branch block: Secondary | ICD-10-CM | POA: Diagnosis not present

## 2022-10-09 DIAGNOSIS — E871 Hypo-osmolality and hyponatremia: Secondary | ICD-10-CM | POA: Diagnosis not present

## 2022-10-09 DIAGNOSIS — E861 Hypovolemia: Secondary | ICD-10-CM | POA: Diagnosis not present

## 2022-10-09 DIAGNOSIS — R404 Transient alteration of awareness: Secondary | ICD-10-CM | POA: Diagnosis not present

## 2022-10-09 DIAGNOSIS — R231 Pallor: Secondary | ICD-10-CM | POA: Diagnosis not present

## 2022-10-09 DIAGNOSIS — R9431 Abnormal electrocardiogram [ECG] [EKG]: Secondary | ICD-10-CM | POA: Diagnosis not present

## 2022-10-09 DIAGNOSIS — E878 Other disorders of electrolyte and fluid balance, not elsewhere classified: Secondary | ICD-10-CM | POA: Diagnosis not present

## 2022-10-09 DIAGNOSIS — K529 Noninfective gastroenteritis and colitis, unspecified: Secondary | ICD-10-CM | POA: Diagnosis not present

## 2022-10-09 DIAGNOSIS — E1143 Type 2 diabetes mellitus with diabetic autonomic (poly)neuropathy: Secondary | ICD-10-CM | POA: Diagnosis not present

## 2022-10-09 DIAGNOSIS — I510 Cardiac septal defect, acquired: Secondary | ICD-10-CM | POA: Diagnosis not present

## 2022-10-10 DIAGNOSIS — R1110 Vomiting, unspecified: Secondary | ICD-10-CM | POA: Diagnosis not present

## 2022-10-10 DIAGNOSIS — Z931 Gastrostomy status: Secondary | ICD-10-CM | POA: Diagnosis not present

## 2022-10-10 DIAGNOSIS — Z880 Allergy status to penicillin: Secondary | ICD-10-CM | POA: Diagnosis not present

## 2022-10-10 DIAGNOSIS — K529 Noninfective gastroenteritis and colitis, unspecified: Secondary | ICD-10-CM | POA: Diagnosis not present

## 2022-10-10 DIAGNOSIS — E875 Hyperkalemia: Secondary | ICD-10-CM | POA: Diagnosis present

## 2022-10-10 DIAGNOSIS — I4510 Unspecified right bundle-branch block: Secondary | ICD-10-CM | POA: Diagnosis not present

## 2022-10-10 DIAGNOSIS — E1122 Type 2 diabetes mellitus with diabetic chronic kidney disease: Secondary | ICD-10-CM | POA: Diagnosis not present

## 2022-10-10 DIAGNOSIS — Z79899 Other long term (current) drug therapy: Secondary | ICD-10-CM | POA: Diagnosis not present

## 2022-10-10 DIAGNOSIS — Z992 Dependence on renal dialysis: Secondary | ICD-10-CM | POA: Diagnosis not present

## 2022-10-10 DIAGNOSIS — I510 Cardiac septal defect, acquired: Secondary | ICD-10-CM | POA: Diagnosis not present

## 2022-10-10 DIAGNOSIS — E860 Dehydration: Secondary | ICD-10-CM | POA: Diagnosis not present

## 2022-10-10 DIAGNOSIS — I700 Atherosclerosis of aorta: Secondary | ICD-10-CM | POA: Diagnosis not present

## 2022-10-10 DIAGNOSIS — Z8673 Personal history of transient ischemic attack (TIA), and cerebral infarction without residual deficits: Secondary | ICD-10-CM | POA: Diagnosis not present

## 2022-10-10 DIAGNOSIS — E871 Hypo-osmolality and hyponatremia: Secondary | ICD-10-CM | POA: Diagnosis not present

## 2022-10-10 DIAGNOSIS — E861 Hypovolemia: Secondary | ICD-10-CM | POA: Diagnosis not present

## 2022-10-10 DIAGNOSIS — Z933 Colostomy status: Secondary | ICD-10-CM | POA: Diagnosis not present

## 2022-10-10 DIAGNOSIS — E878 Other disorders of electrolyte and fluid balance, not elsewhere classified: Secondary | ICD-10-CM | POA: Diagnosis not present

## 2022-10-10 DIAGNOSIS — E039 Hypothyroidism, unspecified: Secondary | ICD-10-CM | POA: Diagnosis not present

## 2022-10-10 DIAGNOSIS — N186 End stage renal disease: Secondary | ICD-10-CM | POA: Diagnosis not present

## 2022-10-10 DIAGNOSIS — K219 Gastro-esophageal reflux disease without esophagitis: Secondary | ICD-10-CM | POA: Diagnosis not present

## 2022-10-10 DIAGNOSIS — Z7901 Long term (current) use of anticoagulants: Secondary | ICD-10-CM | POA: Diagnosis not present

## 2022-10-10 DIAGNOSIS — Z794 Long term (current) use of insulin: Secondary | ICD-10-CM | POA: Diagnosis not present

## 2022-10-10 DIAGNOSIS — R55 Syncope and collapse: Secondary | ICD-10-CM | POA: Diagnosis not present

## 2022-10-10 DIAGNOSIS — R9431 Abnormal electrocardiogram [ECG] [EKG]: Secondary | ICD-10-CM | POA: Diagnosis not present

## 2022-10-10 DIAGNOSIS — I959 Hypotension, unspecified: Secondary | ICD-10-CM | POA: Diagnosis not present

## 2022-10-15 DIAGNOSIS — Z992 Dependence on renal dialysis: Secondary | ICD-10-CM | POA: Diagnosis not present

## 2022-10-15 DIAGNOSIS — N2581 Secondary hyperparathyroidism of renal origin: Secondary | ICD-10-CM | POA: Diagnosis not present

## 2022-10-15 DIAGNOSIS — D509 Iron deficiency anemia, unspecified: Secondary | ICD-10-CM | POA: Diagnosis not present

## 2022-10-15 DIAGNOSIS — D631 Anemia in chronic kidney disease: Secondary | ICD-10-CM | POA: Diagnosis not present

## 2022-10-15 DIAGNOSIS — N186 End stage renal disease: Secondary | ICD-10-CM | POA: Diagnosis not present

## 2022-10-15 NOTE — Progress Notes (Unsigned)
[facility] [place]  ASSESSMENT / PLAN: Francisco Baldwin is a 67 y.o. right handed male in need of permanent dialysis access. I reviewed options for dialysis in detail with the patient, including hemodialysis and peritoneal dialysis. I counseled the patient to ask their nephrologist about their candidacy for renal transplant. I counseled the patient that dialysis access requires surveillance and periodic maintenance. Plan to proceed with left brachiobasilic AVF vs. AVG as soon as his dialysis schedule and OR schedule allows.   CHIEF COMPLAINT: ESRD in need of access  HISTORY OF PRESENT ILLNESS: Francisco Baldwin is a 67 y.o. male with end-stage renal disease dialyzing Monday, Wednesday, and Friday through a right IJ tunneled dialysis catheter.  The patient has had a complicated recent medical history.  He was admitted at [facility] critically ill with PD peritonitis.  He ultimately suffered a sigmoid perforation and required laparotomy.  He had a prolonged ICU and hospital course.  He was ultimately discharged to an LTAC.  He is finally returned home.  Dialyzing through this catheter since his hospitalization.  He presents today to discuss permanent dialysis options.  His son is with him who acts as a Nurse, learning disability.  The patient is right-handed.  He has never had a pacemaker.  He is never had dialysis access surgery before.   Past Medical History:  Diagnosis Date   Acute respiratory failure with hypercapnia (HCC)    Anemia in other chronic diseases classified elsewhere    Anxiety    Cerebral infarction (HCC)    Chronic kidney disease    Dependence on renal dialysis (HCC)    Depression    Diabetes mellitus without complication (HCC)    Diverticulitis large intestine w/o perforation or abscess w/o bleeding    Dysphagia    End stage renal disease (HCC)    Essential (primary) hypertension    GERD (gastroesophageal reflux disease)    Hyperlipidemia     Ileostomy status (HCC)    Metabolic encephalopathy    Personal history of other venous thrombosis and embolism    Presence of other vascular implants and grafts    Radiculopathy    Lumbar region   Thyroid disease    Type 2 diabetes mellitus with diabetic chronic kidney disease (HCC)     Past Surgical History:  Procedure Laterality Date   DIALYSIS/PERMA CATHETER REMOVAL     EXPLORATORY LAPAROTOMY     Procedure: LAPAROTOMY EXPLORATORY, SIGMOID BOWEL RESECTION, OPEN ABDOMEN; Surgeon: Leda Min, MD; Location: [facility] MAIN OR; Service: Trauma; Laterality: N/A;   EXPLORATORY LAPAROTOMY     Procedure: RE-EXPLORATION LAPAROTOMY, diverting loop ileostomy, rectosigmoid anastomosis; Surgeon: Leda Min, MD; Location: [facility] MAIN OR; Service: Trauma; Laterality: N/A;   EYE SURGERY Bilateral    Due to diabetes not cataracs   LAPAROSCOPIC INSERTION PERITONEAL CATHETER      Family History  Problem Relation Age of Onset   Diabetes Mother    Hypertension Mother    Diabetes Father     Social History   Socioeconomic History   Marital status: Married    Spouse name: Not on file   Number of children: Not on file   Years of education: Not on file   Highest education level: Not on file  Occupational History   Not on file  Tobacco Use   Smoking status: Former    Types: Cigarettes   Smokeless tobacco: Never  Substance and Sexual Activity   Alcohol use: Not Currently  Drug use: Not on file   Sexual activity: Not on file  Other Topics Concern   Not on file  Social History Narrative   Not on file   Social Determinants of Health   Financial Resource Strain: Not on file  Food Insecurity: Low Risk  (05/03/2022)   Received from [facility] visits prior to 05/26/2022.   Food    Within the past 12 months, you worried that your food would run out before you got money to buy more food: Never true    Within the past 12 months, the food you bought just  didn't last and you didn't have money to get more: Never true  Transportation Needs: Not on file (05/03/2022)  Physical Activity: Not on file  Stress: Not on file  Social Connections: Not on file  Intimate Partner Violence: Low Risk  (05/03/2022)   Received from [facility] visits prior to 05/26/2022.   Safety    How often does anyone, including family and friends, physically hurt you?: Never    How often does anyone, including family and friends, insult or talk down to you?: Never    How often does anyone, including family and friends, threaten you with harm?: Never    How often does anyone, including family and friends, scream or curse at you?: Never    Allergies  Allergen Reactions   Penicillins Rash    Mild cutaneous reaction without swelling or trouble breathing. Likely reasonable to give cephalexin for pre-transplant prophylaxis.      Current Outpatient Medications  Medication Sig Dispense Refill   amLODipine (NORVASC) 5 MG tablet Take 5 mg by mouth daily.     apixaban (ELIQUIS) 2.5 MG TABS tablet Take 2.5 mg by mouth 2 (two) times daily.     Ascorbic Acid (VITAMIN C) 500 MG CAPS Take 500 mg by mouth daily.     collagenase (SANTYL) 250 UNIT/GM ointment Apply 1 Application topically daily.     Insulin Glargine Solostar (LANTUS) 100 UNIT/ML Solostar Pen Inject 30 Units into the skin daily. Hold if blood sugar less than 150     insulin lispro (HUMALOG KWIKPEN) 100 UNIT/ML KwikPen Inject 5 Units into the skin 4 (four) times daily - after meals and at bedtime.     ipratropium-albuterol (DUONEB) 0.5-2.5 (3) MG/3ML SOLN Take 3 mLs by nebulization every 4 (four) hours as needed (shortness of breath or wheezing).     levothyroxine (SYNTHROID) 100 MCG tablet Take 100 mcg by mouth daily before breakfast.     midodrine (PROAMATINE) 10 MG tablet Take 10 mg by mouth 3 (three) times a week. M, W, F     Nutritional Supplements (FEEDING SUPPLEMENT, NEPRO CARB STEADY,) LIQD Take  237,240 mLs by mouth 2 (two) times daily.     ondansetron (ZOFRAN) 4 MG tablet Take 4 mg by mouth every 8 (eight) hours as needed for nausea or vomiting.     OXYBUTYNIN CHLORIDE PO Take 5 mg by mouth in the morning and at bedtime.     pantoprazole (PROTONIX) 40 MG tablet Take 40 mg by mouth every 12 (twelve) hours as needed (GERD).     predniSONE (DELTASONE) 2.5 MG tablet Take 2.5 mg by mouth daily with breakfast.     sevelamer carbonate (RENVELA) 2.4 g PACK Take 2.4 g by mouth 3 (three) times daily with meals.     No current facility-administered medications for this visit.    PHYSICAL EXAM There were no vitals  filed for this visit.  Elderly gentleman in no acute distress Regular rate and rhythm Unlabored breathing 2+ radial pulses bilaterally TDC in right chest  PERTINENT LABORATORY AND RADIOLOGIC DATA  Most recent CBC    Latest Ref Rng & Units 08/01/2022    1:25 PM  CBC  WBC 4.0 - 10.5 K/uL 9.8   Hemoglobin 13.0 - 17.0 g/dL 09.8   Hematocrit 11.9 - 52.0 % 35.1   Platelets 150 - 400 K/uL 296      Most recent CMP    Latest Ref Rng & Units 08/01/2022    1:25 PM  CMP  Glucose 70 - 99 mg/dL 147   BUN 8 - 23 mg/dL 16   Creatinine 8.29 - 1.24 mg/dL 5.62   Sodium 130 - 865 mmol/L 133   Potassium 3.5 - 5.1 mmol/L 4.6   Chloride 98 - 111 mmol/L 96   CO2 22 - 32 mmol/L 27   Calcium 8.9 - 10.3 mg/dL 8.6   Total Protein 6.5 - 8.1 g/dL 7.0   Total Bilirubin 0.3 - 1.2 mg/dL 0.7   Alkaline Phos 38 - 126 U/L 96   AST 15 - 41 U/L 17   ALT 0 - 44 U/L 14    Left basilic vein appears suitable for AV fistula creation  Rande Brunt. Lenell Antu, MD FACS [facility] Phone Number: [phone] 10/15/2022 2:30 PM   Total time spent on preparing this encounter including chart review, data review, collecting history, examining the patient, coordinating care for this new patient, 60 minutes.  Portions of this report may have been transcribed using voice  recognition software.  Every effort has been made to ensure accuracy; however, inadvertent computerized transcription errors may still be present.

## 2022-10-15 NOTE — H&P (View-Only) (Signed)
[facility] OF [place]  ASSESSMENT / PLAN: Francisco Baldwin is a 67 y.o. right handed male in need of permanent dialysis access. I reviewed options for dialysis in detail with the patient, including hemodialysis and peritoneal dialysis. I counseled the patient to ask their nephrologist about their candidacy for renal transplant. I counseled the patient that dialysis access requires surveillance and periodic maintenance. Plan to proceed with left brachiobasilic AVF vs. AVG as soon as his dialysis schedule and OR schedule allows.   CHIEF COMPLAINT: ESRD in need of access  HISTORY OF PRESENT ILLNESS: Francisco Baldwin is a 67 y.o. male with end-stage renal disease dialyzing Monday, Wednesday, and Friday through a right IJ tunneled dialysis catheter.  The patient has had a complicated recent medical history.  He was admitted at [facility] critically ill with PD peritonitis.  He ultimately suffered a sigmoid perforation and required laparotomy.  He had a prolonged ICU and hospital course.  He was ultimately discharged to an LTAC.  He is finally returned home.  Dialyzing through this catheter since his hospitalization.  He presents today to discuss permanent dialysis options.  His son is with him who acts as a Nurse, learning disability.  The patient is right-handed.  He has never had a pacemaker.  He is never had dialysis access surgery before.   Past Medical History:  Diagnosis Date   Acute respiratory failure with hypercapnia (HCC)    Anemia in other chronic diseases classified elsewhere    Anxiety    Cerebral infarction (HCC)    Chronic kidney disease    Dependence on renal dialysis (HCC)    Depression    Diabetes mellitus without complication (HCC)    Diverticulitis large intestine w/o perforation or abscess w/o bleeding    Dysphagia    End stage renal disease (HCC)    Essential (primary) hypertension    GERD (gastroesophageal reflux disease)    Hyperlipidemia     Ileostomy status (HCC)    Metabolic encephalopathy    Personal history of other venous thrombosis and embolism    Presence of other vascular implants and grafts    Radiculopathy    Lumbar region   Thyroid disease    Type 2 diabetes mellitus with diabetic chronic kidney disease (HCC)     Past Surgical History:  Procedure Laterality Date   DIALYSIS/PERMA CATHETER REMOVAL     EXPLORATORY LAPAROTOMY     Procedure: LAPAROTOMY EXPLORATORY, SIGMOID BOWEL RESECTION, OPEN ABDOMEN; Surgeon: Leda Min, MD; Location: [facility] MAIN OR; Service: Trauma; Laterality: N/A;   EXPLORATORY LAPAROTOMY     Procedure: RE-EXPLORATION LAPAROTOMY, diverting loop ileostomy, rectosigmoid anastomosis; Surgeon: Leda Min, MD; Location: [facility] MAIN OR; Service: Trauma; Laterality: N/A;   EYE SURGERY Bilateral    Due to diabetes not cataracs   LAPAROSCOPIC INSERTION PERITONEAL CATHETER      Family History  Problem Relation Age of Onset   Diabetes Mother    Hypertension Mother    Diabetes Father     Social History   Socioeconomic History   Marital status: Married    Spouse name: Not on file   Number of children: Not on file   Years of education: Not on file   Highest education level: Not on file  Occupational History   Not on file  Tobacco Use   Smoking status: Former    Types: Cigarettes   Smokeless tobacco: Never  Substance and Sexual Activity   Alcohol use: Not Currently  Drug use: Not on file   Sexual activity: Not on file  Other Topics Concern   Not on file  Social History Narrative   Not on file   Social Determinants of Health   Financial Resource Strain: Not on file  Food Insecurity: Low Risk  (05/03/2022)   Received from [facility] visits prior to 05/26/2022.   Food    Within the past 12 months, you worried that your food would run out before you got money to buy more food: Never true    Within the past 12 months, the food you bought just  didn't last and you didn't have money to get more: Never true  Transportation Needs: Not on file (05/03/2022)  Physical Activity: Not on file  Stress: Not on file  Social Connections: Not on file  Intimate Partner Violence: Low Risk  (05/03/2022)   Received from [facility] visits prior to 05/26/2022.   Safety    How often does anyone, including family and friends, physically hurt you?: Never    How often does anyone, including family and friends, insult or talk down to you?: Never    How often does anyone, including family and friends, threaten you with harm?: Never    How often does anyone, including family and friends, scream or curse at you?: Never    Allergies  Allergen Reactions   Penicillins Rash    Mild cutaneous reaction without swelling or trouble breathing. Likely reasonable to give cephalexin for pre-transplant prophylaxis.      Current Outpatient Medications  Medication Sig Dispense Refill   amLODipine (NORVASC) 5 MG tablet Take 5 mg by mouth daily.     apixaban (ELIQUIS) 2.5 MG TABS tablet Take 2.5 mg by mouth 2 (two) times daily.     Ascorbic Acid (VITAMIN C) 500 MG CAPS Take 500 mg by mouth daily.     collagenase (SANTYL) 250 UNIT/GM ointment Apply 1 Application topically daily.     Insulin Glargine Solostar (LANTUS) 100 UNIT/ML Solostar Pen Inject 30 Units into the skin daily. Hold if blood sugar less than 150     insulin lispro (HUMALOG KWIKPEN) 100 UNIT/ML KwikPen Inject 5 Units into the skin 4 (four) times daily - after meals and at bedtime.     ipratropium-albuterol (DUONEB) 0.5-2.5 (3) MG/3ML SOLN Take 3 mLs by nebulization every 4 (four) hours as needed (shortness of breath or wheezing).     levothyroxine (SYNTHROID) 100 MCG tablet Take 100 mcg by mouth daily before breakfast.     midodrine (PROAMATINE) 10 MG tablet Take 10 mg by mouth 3 (three) times a week. M, W, F     Nutritional Supplements (FEEDING SUPPLEMENT, NEPRO CARB STEADY,) LIQD Take  237,240 mLs by mouth 2 (two) times daily.     ondansetron (ZOFRAN) 4 MG tablet Take 4 mg by mouth every 8 (eight) hours as needed for nausea or vomiting.     OXYBUTYNIN CHLORIDE PO Take 5 mg by mouth in the morning and at bedtime.     pantoprazole (PROTONIX) 40 MG tablet Take 40 mg by mouth every 12 (twelve) hours as needed (GERD).     predniSONE (DELTASONE) 2.5 MG tablet Take 2.5 mg by mouth daily with breakfast.     sevelamer carbonate (RENVELA) 2.4 g PACK Take 2.4 g by mouth 3 (three) times daily with meals.     No current facility-administered medications for this visit.    PHYSICAL EXAM There were no vitals  filed for this visit.  Elderly gentleman in no acute distress Regular rate and rhythm Unlabored breathing 2+ radial pulses bilaterally TDC in right chest  PERTINENT LABORATORY AND RADIOLOGIC DATA  Most recent CBC    Latest Ref Rng & Units 08/01/2022    1:25 PM  CBC  WBC 4.0 - 10.5 K/uL 9.8   Hemoglobin 13.0 - 17.0 g/dL 27.2   Hematocrit 53.6 - 52.0 % 35.1   Platelets 150 - 400 K/uL 296      Most recent CMP    Latest Ref Rng & Units 08/01/2022    1:25 PM  CMP  Glucose 70 - 99 mg/dL 644   BUN 8 - 23 mg/dL 16   Creatinine 0.34 - 1.24 mg/dL 7.42   Sodium 595 - 638 mmol/L 133   Potassium 3.5 - 5.1 mmol/L 4.6   Chloride 98 - 111 mmol/L 96   CO2 22 - 32 mmol/L 27   Calcium 8.9 - 10.3 mg/dL 8.6   Total Protein 6.5 - 8.1 g/dL 7.0   Total Bilirubin 0.3 - 1.2 mg/dL 0.7   Alkaline Phos 38 - 126 U/L 96   AST 15 - 41 U/L 17   ALT 0 - 44 U/L 14    Left basilic vein appears suitable for AV fistula creation  Rande Brunt. Lenell Antu, MD FACS [facility] Phone Number: [phone] 10/15/2022 2:30 PM   Total time spent on preparing this encounter including chart review, data review, collecting history, examining the patient, coordinating care for this new patient, 60 minutes.  Portions of this report may have been transcribed using voice  recognition software.  Every effort has been made to ensure accuracy; however, inadvertent computerized transcription errors may still be present.

## 2022-10-16 ENCOUNTER — Ambulatory Visit (HOSPITAL_COMMUNITY)
Admission: RE | Admit: 2022-10-16 | Discharge: 2022-10-16 | Disposition: A | Discharge status: Home / Self Care | Payer: Medicare Other | Source: Ambulatory Visit | Attending: Vascular Surgery | Admitting: Vascular Surgery

## 2022-10-16 ENCOUNTER — Ambulatory Visit (INDEPENDENT_AMBULATORY_CARE_PROVIDER_SITE_OTHER)
Admission: RE | Admit: 2022-10-16 | Discharge: 2022-10-16 | Disposition: A | Discharge status: Home / Self Care | Payer: Medicare Other | Source: Ambulatory Visit | Attending: Vascular Surgery | Admitting: Vascular Surgery

## 2022-10-16 ENCOUNTER — Encounter: Payer: Self-pay | Admitting: Vascular Surgery

## 2022-10-16 ENCOUNTER — Ambulatory Visit: Payer: Medicare Other | Admitting: Vascular Surgery

## 2022-10-16 VITALS — BP 147/71 | HR 72 | Temp 97.9°F | Resp 20 | Ht 65.0 in | Wt 163.0 lb

## 2022-10-16 DIAGNOSIS — R410 Disorientation, unspecified: Secondary | ICD-10-CM | POA: Diagnosis not present

## 2022-10-16 DIAGNOSIS — Z992 Dependence on renal dialysis: Secondary | ICD-10-CM

## 2022-10-16 DIAGNOSIS — E161 Other hypoglycemia: Secondary | ICD-10-CM | POA: Diagnosis not present

## 2022-10-16 DIAGNOSIS — N186 End stage renal disease: Secondary | ICD-10-CM | POA: Diagnosis not present

## 2022-10-16 DIAGNOSIS — E162 Hypoglycemia, unspecified: Secondary | ICD-10-CM | POA: Diagnosis not present

## 2022-10-17 ENCOUNTER — Other Ambulatory Visit: Payer: Self-pay | Admitting: *Deleted

## 2022-10-17 DIAGNOSIS — D509 Iron deficiency anemia, unspecified: Secondary | ICD-10-CM | POA: Diagnosis not present

## 2022-10-17 DIAGNOSIS — D631 Anemia in chronic kidney disease: Secondary | ICD-10-CM | POA: Diagnosis not present

## 2022-10-17 DIAGNOSIS — Z992 Dependence on renal dialysis: Secondary | ICD-10-CM | POA: Diagnosis not present

## 2022-10-17 DIAGNOSIS — N186 End stage renal disease: Secondary | ICD-10-CM

## 2022-10-17 DIAGNOSIS — N2581 Secondary hyperparathyroidism of renal origin: Secondary | ICD-10-CM | POA: Diagnosis not present

## 2022-10-18 DIAGNOSIS — Z932 Ileostomy status: Secondary | ICD-10-CM | POA: Insufficient documentation

## 2022-10-18 DIAGNOSIS — K5720 Diverticulitis of large intestine with perforation and abscess without bleeding: Secondary | ICD-10-CM | POA: Diagnosis not present

## 2022-10-18 DIAGNOSIS — Z86010 Personal history of colonic polyps: Secondary | ICD-10-CM | POA: Diagnosis not present

## 2022-10-19 ENCOUNTER — Other Ambulatory Visit: Payer: Medicare Other

## 2022-10-19 DIAGNOSIS — N186 End stage renal disease: Secondary | ICD-10-CM | POA: Diagnosis not present

## 2022-10-19 DIAGNOSIS — N2581 Secondary hyperparathyroidism of renal origin: Secondary | ICD-10-CM | POA: Diagnosis not present

## 2022-10-19 DIAGNOSIS — Z992 Dependence on renal dialysis: Secondary | ICD-10-CM | POA: Diagnosis not present

## 2022-10-19 DIAGNOSIS — D509 Iron deficiency anemia, unspecified: Secondary | ICD-10-CM | POA: Diagnosis not present

## 2022-10-19 DIAGNOSIS — D631 Anemia in chronic kidney disease: Secondary | ICD-10-CM | POA: Diagnosis not present

## 2022-10-20 DIAGNOSIS — I6389 Other cerebral infarction: Secondary | ICD-10-CM | POA: Diagnosis not present

## 2022-10-22 DIAGNOSIS — Z992 Dependence on renal dialysis: Secondary | ICD-10-CM | POA: Diagnosis not present

## 2022-10-22 DIAGNOSIS — N2581 Secondary hyperparathyroidism of renal origin: Secondary | ICD-10-CM | POA: Diagnosis not present

## 2022-10-22 DIAGNOSIS — D631 Anemia in chronic kidney disease: Secondary | ICD-10-CM | POA: Diagnosis not present

## 2022-10-22 DIAGNOSIS — N186 End stage renal disease: Secondary | ICD-10-CM | POA: Diagnosis not present

## 2022-10-22 DIAGNOSIS — D509 Iron deficiency anemia, unspecified: Secondary | ICD-10-CM | POA: Diagnosis not present

## 2022-10-23 ENCOUNTER — Ambulatory Visit: Payer: Medicare Other | Attending: Cardiology

## 2022-10-23 DIAGNOSIS — R079 Chest pain, unspecified: Secondary | ICD-10-CM

## 2022-10-23 LAB — ECHOCARDIOGRAM COMPLETE
Area-P 1/2: 2.89 cm2
S' Lateral: 3.1 cm

## 2022-10-24 ENCOUNTER — Encounter (HOSPITAL_COMMUNITY): Payer: Self-pay | Admitting: Vascular Surgery

## 2022-10-24 ENCOUNTER — Other Ambulatory Visit: Payer: Self-pay

## 2022-10-24 DIAGNOSIS — N186 End stage renal disease: Secondary | ICD-10-CM | POA: Diagnosis not present

## 2022-10-24 DIAGNOSIS — D509 Iron deficiency anemia, unspecified: Secondary | ICD-10-CM | POA: Diagnosis not present

## 2022-10-24 DIAGNOSIS — D631 Anemia in chronic kidney disease: Secondary | ICD-10-CM | POA: Diagnosis not present

## 2022-10-24 DIAGNOSIS — N2581 Secondary hyperparathyroidism of renal origin: Secondary | ICD-10-CM | POA: Diagnosis not present

## 2022-10-24 DIAGNOSIS — Z992 Dependence on renal dialysis: Secondary | ICD-10-CM | POA: Diagnosis not present

## 2022-10-24 NOTE — Progress Notes (Signed)
Anesthesia Chart Review: Same day workup  67 y.o. male with end-stage renal disease dialyzing Monday, Wednesday, and Friday through a right IJ tunneled dialysis catheter.  The patient has had a complicated recent medical history.  He was admitted at [facility] 04/2022 critically ill with PD peritonitis.  He ultimately suffered a sigmoid perforation and required laparotomy.  He had a prolonged ICU and hospital course.  Dialyzing through this catheter since his hospitalization 05/19/22.   Brain MRI during admission showed punctate acute infarct in left parietal lobe and moderate small vessel ischemic changes. He was seen in followup by neurologist Dr. Jamey Ripa at [facility] 10/09/22. 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 Nothing to strongly suggest another condition such as amyloidosis. His small stroke in the setting of sepsis may have been related to poor perfusion due to low blood pressure and being so tiny likely is asymptomatic. He had a DVT during his hospitalization and remains on Eliquis." Advised to followup with neurology PRN.   Hx of CAD s/p DES to LAD 11/16/19. Nuclear stress 06/29/21 was nonischemic. Seen by cardiologist Dr. Bing Matter 09/25/22. Updated echo ordered at that time.  Echo 10/23/22 showed EF 60-65%, grade 1 dd, no significant valvular abnormalities.   IIDM2, A1c 7.3 on 05/12/22.  Pt will need DOS labs and eval.   EKG 08/01/22:  Sinus rhythm. Rate 80. Right bundle branch block  TTE 10/23/22:  1. Left ventricular ejection fraction, by estimation, is 60 to 65%. The  left ventricle has normal function. The left ventricle has no regional  wall motion abnormalities. There is mild left ventricular hypertrophy.  Left ventricular diastolic parameters  are consistent with Grade I diastolic dysfunction (impaired relaxation).  The average left ventricular global longitudinal strain is -15.3 %. The  global longitudinal strain is  abnormal.   2. Right ventricular systolic function is normal. The right ventricular  size is normal.   3. The mitral valve is normal in structure. No evidence of mitral valve  regurgitation. No evidence of mitral stenosis.   4. The aortic valve is normal in structure. Aortic valve regurgitation is  not visualized. No aortic stenosis is present.   5. The inferior vena cava is normal in size with greater than 50%  respiratory variability, suggesting right atrial pressure of 3 mmHg.   Nuclear stress 06/29/21: - No evidence of any significant ischemia or scar  - Left ventricular systolic function is normal. Post stress the ejection fraction is > 60%.  - Attenuation CT scan shows post PCI findings  - Incidentally noted on the attenuation CT scan is perihepatic fluid  - Compared to prior study (10/09/2019), there is no significant change     Zannie Cove [facility] Short Stay Center/Anesthesiology Phone [phone] 10/24/2022 11:05 AM

## 2022-10-24 NOTE — Anesthesia Preprocedure Evaluation (Signed)
Anesthesia Evaluation  Patient identified by MRN, date of birth, ID band Patient awake    Reviewed: Allergy & Precautions, NPO status , Patient's Chart, lab work & pertinent test results  Airway Mallampati: II  TM Distance: >3 FB Neck ROM: Full    Dental no notable dental hx.    Pulmonary former smoker   Pulmonary exam normal        Cardiovascular hypertension, Pt. on medications  Rhythm:Regular Rate:Normal     Neuro/Psych   Anxiety Depression    CVA    GI/Hepatic ,GERD  ,,  Endo/Other  diabetesHypothyroidism    Renal/GU ESRF and DialysisRenal disease  negative genitourinary   Musculoskeletal   Abdominal Normal abdominal exam  (+)   Peds  Hematology  (+) Blood dyscrasia, anemia Lab Results      Component                Value               Date                      WBC                      9.8                 08/01/2022                HGB                      11.9 (L)            10/25/2022                HCT                      35.0 (L)            10/25/2022                MCV                      97.8                08/01/2022                PLT                      296                 08/01/2022             Lab Results      Component                Value               Date                      NA                       130 (L)             10/25/2022                K                        4.2  10/25/2022                CO2                      27                  08/01/2022                GLUCOSE                  126 (H)             10/25/2022                BUN                      16                  10/25/2022                CREATININE               5.10 (H)            10/25/2022                CALCIUM                  8.6 (L)             08/01/2022                GFRNONAA                 8 (L)               08/01/2022              Anesthesia Other Findings   Reproductive/Obstetrics                              Anesthesia Physical Anesthesia Plan  ASA: 3  Anesthesia Plan: MAC and Regional   Post-op Pain Management:    Induction: Intravenous  PONV Risk Score and Plan: 1 and Propofol infusion, Treatment may vary due to age or medical condition and Ondansetron  Airway Management Planned: Simple Face Mask and Nasal Cannula  Additional Equipment: None  Intra-op Plan:   Post-operative Plan:   Informed Consent: I have reviewed the patients History and Physical, chart, labs and discussed the procedure including the risks, benefits and alternatives for the proposed anesthesia with the patient or authorized representative who has indicated his/her understanding and acceptance.     Dental advisory given and Interpreter used for interveiw  Plan Discussed with: CRNA  Anesthesia Plan Comments: (PAT note by Antionette Poles, PA-C: 67 y.o. male with end-stage renal disease dialyzing Monday, Wednesday, and Friday through a right IJ tunneled dialysis catheter.  The patient has had a complicated recent medical history.  He was admitted at [facility] 04/2022 critically ill with PD peritonitis.  He ultimately suffered a sigmoid perforation and required laparotomy.  He had a prolonged ICU and hospital course.  Dialyzing through this catheter since his hospitalization 05/19/22.   Brain MRI during admission showed punctate acute infarct in left parietal lobe and moderate small vessel ischemic changes. He was seen in followup by neurologist Dr. Jamey Ripa at [facility] 10/09/22. 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 Nothing to strongly  suggest another condition such as amyloidosis. His small stroke in the setting of sepsis may have been related to poor perfusion due to low blood pressure and being so tiny likely is asymptomatic. He had a DVT during his hospitalization and remains on Eliquis." Advised to followup  with neurology PRN.   Hx of CAD s/p DES to LAD 11/16/19. Nuclear stress 06/29/21 was nonischemic. Seen by cardiologist Dr. Bing Matter 09/25/22. Updated echo ordered at that time.  Echo 10/23/22 showed EF 60-65%, grade 1 dd, no significant valvular abnormalities.   IIDM2, A1c 7.3 on 05/12/22.  Pt will need DOS labs and eval.   EKG 08/01/22:  Sinus rhythm. Rate 80. Right bundle branch block  TTE 10/23/22: 1. Left ventricular ejection fraction, by estimation, is 60 to 65%. The  left ventricle has normal function. The left ventricle has no regional  wall motion abnormalities. There is mild left ventricular hypertrophy.  Left ventricular diastolic parameters  are consistent with Grade I diastolic dysfunction (impaired relaxation).  The average left ventricular global longitudinal strain is -15.3 %. The  global longitudinal strain is abnormal.  2. Right ventricular systolic function is normal. The right ventricular  size is normal.  3. The mitral valve is normal in structure. No evidence of mitral valve  regurgitation. No evidence of mitral stenosis.  4. The aortic valve is normal in structure. Aortic valve regurgitation is  not visualized. No aortic stenosis is present.  5. The inferior vena cava is normal in size with greater than 50%  respiratory variability, suggesting right atrial pressure of 3 mmHg.   Nuclear stress 06/29/21: - No evidence of anysignificant ischemia or scar  - Left ventricular systolic function is normal. Post stress the ejection fraction is > 60%.  - Attenuation CT scan shows post PCI findings  - Incidentally noted on the attenuation CT scan is perihepatic fluid  - Compared toprior study (10/09/2019), there is no significant change    )        Anesthesia Quick Evaluation

## 2022-10-24 NOTE — Progress Notes (Addendum)
SDW CALL  Patient was given pre-op instructions over the phone. The opportunity was given for the patient to ask questions. No further questions asked. Patient verbalized understanding of instructions given.   PCP - Kyung Rudd- pt's son states pt has appt with new PCP but he doesn't remember the name. Pt's appt is after surgery.  Cardiologist - Judye Bos Nephrology - Lambert Mody  PPM/ICD - denies Device Orders -  Rep Notified -   Chest x-ray -  EKG - 08/01/22 Stress Test - 06/29/21 ECHO - 10/23/22 Cardiac Cath -  11/13/2019  Sleep Study - 09/03/2022 CPAP - no  Fasting Blood Sugar - 112-145 Checks Blood Sugar 4 times a day  Blood Thinner Instructions:hold 3 days prior to surgery per Dr. Manson Passey last dose was 10/21/22. Aspirin Instructions:na  ERAS Protcol -no PRE-SURGERY Ensure or G2-   COVID TEST- na   Anesthesia review: yes-hx chest pain,CVA,HTN,hypotension,ESRD,hospitalized with sepsis due to peritonitis in 04/2022.  Patient denies shortness of breath, fever, cough and chest pain over the phone call    Surgical Instructions    Your procedure is scheduled on August 1  Report to [facility] Main Entrance "A" at 6:30 A.M., then check in with the Admitting office.  Call this number if you have problems the morning of surgery:  [phone]    Remember:  Do not eat or drink anything after midnight the night before your surgery     Take these medicines the morning of surgery with A SIP OF WATER:  Synthroid,Ditropan,Protonix   As of today, STOP taking any Aspirin (unless otherwise instructed by your surgeon) Aleve, Naproxen, Ibuprofen, Motrin, Advil, Goody's, BC's, all herbal medications, fish oil, and all vitamins.  WHAT DO I DO ABOUT MY DIABETES MEDICATION?   Do not take oral diabetes medicines (pills) the morning of surgery.  THE NIGHT BEFORE SURGERY, take 15 units of lantus insulin if blood sugar above 150       THE MORNING OF  SURGERY, take 2 units of Humalog insulin if blood sugar more than 220   HOW TO MANAGE YOUR DIABETES BEFORE AND AFTER SURGERY  How do I manage my blood sugar before surgery?  Check your blood sugar the morning of your surgery when you wake up and every 2 hours until you get to the Short Stay unit.  If your blood sugar is less than 70 mg/dL, you will need to treat for low blood sugar: Do not take insulin. Treat a low blood sugar (less than 70 mg/dL) with  cup of clear juice (cranberry or apple), 4 glucose tablets, OR glucose gel. Recheck blood sugar in 15 minutes after treatment (to make sure it is greater than 70 mg/dL). If your blood sugar is not greater than 70 mg/dL on recheck, call [phone] for further instructions. Report your blood sugar to the short stay nurse when you get to Short Stay.  [facility] is not responsible for any belongings or valuables. .   Do NOT Smoke (Tobacco/Vaping)  24 hours prior to your procedure  If you use a CPAP at night, you may bring your mask for your overnight stay.   Contacts, glasses, hearing aids, dentures or partials may not be worn into surgery, please bring cases for these belongings   Patients discharged the day of surgery will not be allowed to drive home, and someone needs to stay with them for 24 hours.   Special instructions:    Oral Hygiene is also important to reduce your risk  of infection.  Remember - BRUSH YOUR TEETH THE MORNING OF SURGERY WITH YOUR REGULAR TOOTHPASTE   Day of Surgery:  Take a shower the day of or night before with antibacterial soap. Wear Clean/Comfortable clothing the morning of surgery Do not apply any deodorants/lotions.   Do not wear jewelry or makeup Do not wear lotions, powders, perfumes/colognes, or deodorant. Do not shave 48 hours prior to surgery.  Men may shave face and neck. Do not bring valuables to the hospital. Do not wear nail polish, gel polish, artificial nails, or any other type of  covering on natural nails (fingers and toes) If you have artificial nails or gel coating that need to be removed by a nail salon, please have this removed prior to surgery. Artificial nails or gel coating may interfere with anesthesia's ability to adequately monitor your vital signs. Remember to brush your teeth WITH YOUR REGULAR TOOTHPASTE.

## 2022-10-25 ENCOUNTER — Other Ambulatory Visit: Payer: Self-pay

## 2022-10-25 ENCOUNTER — Encounter (HOSPITAL_COMMUNITY)
Admission: RE | Disposition: A | Discharge status: Home / Self Care | Payer: Self-pay | Source: Home / Self Care | Attending: Vascular Surgery

## 2022-10-25 ENCOUNTER — Ambulatory Visit (HOSPITAL_BASED_OUTPATIENT_CLINIC_OR_DEPARTMENT_OTHER): Payer: Medicare Other | Admitting: Physician Assistant

## 2022-10-25 ENCOUNTER — Ambulatory Visit (HOSPITAL_COMMUNITY)
Admission: RE | Admit: 2022-10-25 | Discharge: 2022-10-25 | Disposition: A | Discharge status: Home / Self Care | Payer: Medicare Other | Attending: Vascular Surgery | Admitting: Vascular Surgery

## 2022-10-25 ENCOUNTER — Ambulatory Visit (HOSPITAL_COMMUNITY): Payer: Medicare Other | Admitting: Physician Assistant

## 2022-10-25 DIAGNOSIS — Z992 Dependence on renal dialysis: Secondary | ICD-10-CM | POA: Diagnosis not present

## 2022-10-25 DIAGNOSIS — Z8673 Personal history of transient ischemic attack (TIA), and cerebral infarction without residual deficits: Secondary | ICD-10-CM | POA: Insufficient documentation

## 2022-10-25 DIAGNOSIS — E1122 Type 2 diabetes mellitus with diabetic chronic kidney disease: Secondary | ICD-10-CM | POA: Insufficient documentation

## 2022-10-25 DIAGNOSIS — Z87891 Personal history of nicotine dependence: Secondary | ICD-10-CM | POA: Diagnosis not present

## 2022-10-25 DIAGNOSIS — E039 Hypothyroidism, unspecified: Secondary | ICD-10-CM | POA: Diagnosis not present

## 2022-10-25 DIAGNOSIS — K219 Gastro-esophageal reflux disease without esophagitis: Secondary | ICD-10-CM | POA: Insufficient documentation

## 2022-10-25 DIAGNOSIS — I120 Hypertensive chronic kidney disease with stage 5 chronic kidney disease or end stage renal disease: Secondary | ICD-10-CM | POA: Insufficient documentation

## 2022-10-25 DIAGNOSIS — E782 Mixed hyperlipidemia: Secondary | ICD-10-CM | POA: Diagnosis not present

## 2022-10-25 DIAGNOSIS — N2581 Secondary hyperparathyroidism of renal origin: Secondary | ICD-10-CM | POA: Diagnosis not present

## 2022-10-25 DIAGNOSIS — N186 End stage renal disease: Secondary | ICD-10-CM | POA: Diagnosis not present

## 2022-10-25 DIAGNOSIS — D509 Iron deficiency anemia, unspecified: Secondary | ICD-10-CM | POA: Diagnosis not present

## 2022-10-25 DIAGNOSIS — D631 Anemia in chronic kidney disease: Secondary | ICD-10-CM | POA: Diagnosis not present

## 2022-10-25 DIAGNOSIS — N185 Chronic kidney disease, stage 5: Secondary | ICD-10-CM

## 2022-10-25 HISTORY — PX: AV FISTULA PLACEMENT: SHX1204

## 2022-10-25 HISTORY — DX: Hypothyroidism, unspecified: E03.9

## 2022-10-25 LAB — POCT I-STAT, CHEM 8
BUN: 16 mg/dL (ref 8–23)
Calcium, Ion: 1.16 mmol/L (ref 1.15–1.40)
Chloride: 93 mmol/L — ABNORMAL LOW (ref 98–111)
Creatinine, Ser: 5.1 mg/dL — ABNORMAL HIGH (ref 0.61–1.24)
Glucose, Bld: 126 mg/dL — ABNORMAL HIGH (ref 70–99)
HCT: 35 % — ABNORMAL LOW (ref 39.0–52.0)
Hemoglobin: 11.9 g/dL — ABNORMAL LOW (ref 13.0–17.0)
Potassium: 4.2 mmol/L (ref 3.5–5.1)
Sodium: 130 mmol/L — ABNORMAL LOW (ref 135–145)
TCO2: 27 mmol/L (ref 22–32)

## 2022-10-25 LAB — GLUCOSE, CAPILLARY
Glucose-Capillary: 123 mg/dL — ABNORMAL HIGH (ref 70–99)
Glucose-Capillary: 185 mg/dL — ABNORMAL HIGH (ref 70–99)

## 2022-10-25 SURGERY — ARTERIOVENOUS (AV) FISTULA CREATION
Anesthesia: Monitor Anesthesia Care | Site: Arm Upper | Laterality: Left

## 2022-10-25 MED ORDER — HEPARIN 6000 UNIT IRRIGATION SOLUTION
Status: AC
  Filled 2022-10-25: qty 500

## 2022-10-25 MED ORDER — HEPARIN 6000 UNIT IRRIGATION SOLUTION
Status: DC | PRN
  Administered 2022-10-25: 1

## 2022-10-25 MED ORDER — FENTANYL CITRATE (PF) 100 MCG/2ML IJ SOLN
INTRAMUSCULAR | Status: AC
  Administered 2022-10-25: 50 ug
  Filled 2022-10-25: qty 2

## 2022-10-25 MED ORDER — CHLORHEXIDINE GLUCONATE 4 % EX SOLN: 60.0000 mL | Freq: Once | CUTANEOUS | Status: DC

## 2022-10-25 MED ORDER — INSULIN ASPART 100 UNIT/ML IJ SOLN: 0.0000 [IU] | INTRAMUSCULAR | Status: DC | PRN

## 2022-10-25 MED ORDER — FENTANYL CITRATE (PF) 100 MCG/2ML IJ SOLN: 50.0000 ug | Freq: Once | INTRAMUSCULAR | Status: DC

## 2022-10-25 MED ORDER — ONDANSETRON HCL 4 MG/2ML IJ SOLN
INTRAMUSCULAR | Status: DC | PRN
  Administered 2022-10-25: 4 mg via INTRAVENOUS

## 2022-10-25 MED ORDER — PROPOFOL 500 MG/50ML IV EMUL
INTRAVENOUS | Status: DC | PRN
  Administered 2022-10-25: 25 ug/kg/min via INTRAVENOUS

## 2022-10-25 MED ORDER — CIPROFLOXACIN IN D5W 400 MG/200ML IV SOLN
400.0000 mg | INTRAVENOUS | Status: AC
  Administered 2022-10-25: 400 mg via INTRAVENOUS
  Filled 2022-10-25: qty 200

## 2022-10-25 MED ORDER — LIDOCAINE-EPINEPHRINE (PF) 1 %-1:200000 IJ SOLN
INTRAMUSCULAR | Status: AC
  Filled 2022-10-25: qty 30

## 2022-10-25 MED ORDER — SODIUM CHLORIDE 0.9 % IV SOLN: INTRAVENOUS | Status: DC

## 2022-10-25 MED ORDER — 0.9 % SODIUM CHLORIDE (POUR BTL) OPTIME
TOPICAL | Status: DC | PRN
  Administered 2022-10-25: 1000 mL

## 2022-10-25 MED ORDER — LIDOCAINE-EPINEPHRINE (PF) 1.5 %-1:200000 IJ SOLN
INTRAMUSCULAR | Status: DC | PRN
  Administered 2022-10-25: 30 mL via PERINEURAL

## 2022-10-25 MED ORDER — CHLORHEXIDINE GLUCONATE 0.12 % MT SOLN
15.0000 mL | Freq: Once | OROMUCOSAL | Status: AC
  Administered 2022-10-25: 15 mL via OROMUCOSAL
  Filled 2022-10-25: qty 15

## 2022-10-25 MED ORDER — PROPOFOL 1000 MG/100ML IV EMUL
INTRAVENOUS | Status: AC
  Filled 2022-10-25: qty 100

## 2022-10-25 MED ORDER — OXYCODONE-ACETAMINOPHEN 5-325 MG PO TABS: 1.0000 | ORAL_TABLET | Freq: Four times a day (QID) | ORAL | 0 refills | Status: DC | PRN

## 2022-10-25 MED ORDER — ORAL CARE MOUTH RINSE: 15.0000 mL | Freq: Once | OROMUCOSAL | Status: AC

## 2022-10-25 MED ORDER — ONDANSETRON HCL 4 MG/2ML IJ SOLN
INTRAMUSCULAR | Status: AC
  Filled 2022-10-25: qty 2

## 2022-10-25 SURGICAL SUPPLY — 40 items
APL PRP STRL LF DISP 70% ISPRP (MISCELLANEOUS) ×1
APL SKNCLS STERI-STRIP NONHPOA (GAUZE/BANDAGES/DRESSINGS) ×1
ARMBAND PINK RESTRICT EXTREMIT (MISCELLANEOUS) ×1 IMPLANT
BENZOIN TINCTURE PRP APPL 2/3 (GAUZE/BANDAGES/DRESSINGS) ×1 IMPLANT
CANISTER SUCT 3000ML PPV (MISCELLANEOUS) ×1 IMPLANT
CANNULA VESSEL 3MM 2 BLNT TIP (CANNULA) ×1 IMPLANT
CHLORAPREP W/TINT 26 (MISCELLANEOUS) ×1 IMPLANT
CLIP LIGATING EXTRA MED SLVR (CLIP) ×1 IMPLANT
CLIP LIGATING EXTRA SM BLUE (MISCELLANEOUS) ×1 IMPLANT
CLSR STERI-STRIP ANTIMIC 1/2X4 (GAUZE/BANDAGES/DRESSINGS) IMPLANT
COVER PROBE W GEL 5X96 (DRAPES) IMPLANT
ELECT REM PT RETURN 9FT ADLT (ELECTROSURGICAL) ×1
ELECTRODE REM PT RTRN 9FT ADLT (ELECTROSURGICAL) ×1 IMPLANT
GAUZE SPONGE 4X4 12PLY STRL (GAUZE/BANDAGES/DRESSINGS) IMPLANT
GLOVE BIO SURGEON STRL SZ8 (GLOVE) ×1 IMPLANT
GOWN STRL REUS W/ TWL LRG LVL3 (GOWN DISPOSABLE) ×2 IMPLANT
GOWN STRL REUS W/ TWL XL LVL3 (GOWN DISPOSABLE) ×1 IMPLANT
GOWN STRL REUS W/TWL LRG LVL3 (GOWN DISPOSABLE) ×2
GOWN STRL REUS W/TWL XL LVL3 (GOWN DISPOSABLE) ×1
INSERT FOGARTY SM (MISCELLANEOUS) IMPLANT
KIT BASIN OR (CUSTOM PROCEDURE TRAY) ×1 IMPLANT
KIT TURNOVER KIT B (KITS) ×1 IMPLANT
LOOP VASCULAR MINI 18 RED (MISCELLANEOUS) ×1
NDL 18GX1X1/2 (RX/OR ONLY) (NEEDLE) IMPLANT
NEEDLE 18GX1X1/2 (RX/OR ONLY) (NEEDLE) IMPLANT
NS IRRIG 1000ML POUR BTL (IV SOLUTION) ×1 IMPLANT
PACK CV ACCESS (CUSTOM PROCEDURE TRAY) ×1 IMPLANT
PAD ARMBOARD 7.5X6 YLW CONV (MISCELLANEOUS) ×2 IMPLANT
SLING ARM FOAM STRAP LRG (SOFTGOODS) IMPLANT
SLING ARM FOAM STRAP MED (SOFTGOODS) IMPLANT
STRIP CLOSURE SKIN 1/2X4 (GAUZE/BANDAGES/DRESSINGS) ×1 IMPLANT
SUT MNCRL AB 4-0 PS2 18 (SUTURE) ×1 IMPLANT
SUT PROLENE 6 0 BV (SUTURE) ×1 IMPLANT
SUT VIC AB 3-0 SH 27 (SUTURE) ×1
SUT VIC AB 3-0 SH 27X BRD (SUTURE) ×1 IMPLANT
SYR 3ML LL SCALE MARK (SYRINGE) IMPLANT
TOWEL GREEN STERILE (TOWEL DISPOSABLE) ×1 IMPLANT
UNDERPAD 30X36 HEAVY ABSORB (UNDERPADS AND DIAPERS) ×1 IMPLANT
VASCULAR TIE MINI RED 18IN STL (MISCELLANEOUS) IMPLANT
WATER STERILE IRR 1000ML POUR (IV SOLUTION) ×1 IMPLANT

## 2022-10-25 NOTE — Interval H&P Note (Signed)
History and Physical Interval Note:  10/25/2022 7:19 AM  Francisco Baldwin  has presented today for surgery, with the diagnosis of esrd.  The various methods of treatment have been discussed with the patient and family. After consideration of risks, benefits and other options for treatment, the patient has consented to  Procedure(s): LEFT SRM ARTERIOVENOUS (AV) FISTULA CREATION VERSES GRAFT (Left) as a surgical intervention.  The patient's history has been reviewed, patient examined, no change in status, stable for surgery.  I have reviewed the patient's chart and labs.  Questions were answered to the patient's satisfaction.     Leonie Douglas

## 2022-10-25 NOTE — Discharge Instructions (Signed)
[facility]  Discharge Instructions  AV Fistula or Graft Surgery for Dialysis Access  Please refer to the following instructions for your post-procedure care. Your surgeon or physician assistant will discuss any changes with you.  Activity  You may drive the day following your surgery, if you are comfortable and no longer taking prescription pain medication. Resume full activity as the soreness in your incision resolves.  Bathing/Showering  You may shower after you go home. Keep your incision dry for 48 hours. Do not soak in a bathtub, hot tub, or swim until the incision heals completely. You may not shower if you have a hemodialysis catheter.  Incision Care  Clean your incision with mild soap and water after 48 hours. Pat the area dry with a clean towel. You do not need a bandage unless otherwise instructed. Do not apply any ointments or creams to your incision. You may have skin glue on your incision. Do not peel it off. It will come off on its own in about one week. Your arm may swell a bit after surgery. To reduce swelling use pillows to elevate your arm so it is above your heart. Your doctor will tell you if you need to lightly wrap your arm with an ACE bandage.  Diet  Resume your normal diet. There are not special food restrictions following this procedure. In order to heal from your surgery, it is CRITICAL to get adequate nutrition. Your body requires vitamins, minerals, and protein. Vegetables are the best source of vitamins and minerals. Vegetables also provide the perfect balance of protein. Processed food has little nutritional value, so try to avoid this.  Medications  Resume taking all of your medications. If your incision is causing pain, you may take over-the counter pain relievers such as acetaminophen (Tylenol). If you were prescribed a stronger pain medication, please be aware these medications can cause nausea and constipation. Prevent  nausea by taking the medication with a snack or meal. Avoid constipation by drinking plenty of fluids and eating foods with high amount of fiber, such as fruits, vegetables, and grains.  Do not take Tylenol if you are taking prescription pain medications.  Follow up Your surgeon may want to see you in the office following your access surgery. If so, this will be arranged at the time of your surgery.  Please call us immediately for any of the following conditions:  Increased pain, redness, drainage (pus) from your incision site Fever of 101 degrees or higher Severe or worsening pain at your incision site Hand pain or numbness.  Reduce your risk of vascular disease:  Stop smoking. If you would like help, call [facility] at 1-800-QUIT-NOW (418-420-4284) or [place] at 413-046-7353  Manage your cholesterol Maintain a desired weight Control your diabetes Keep your blood pressure down  Dialysis  It will take several weeks to several months for your new dialysis access to be ready for use. Your surgeon will determine when it is okay to use it. Your nephrologist will continue to direct your dialysis. You can continue to use your Permcath until your new access is ready for use.   10/25/2022 Francisco Baldwin [id number] [birth year]  Surgeon(s): Leonie Douglas, MD  Procedure(s): LEFT ARM FIRST STAGE BASILIC VEIN (AV) FISTULA CREATION   May stick graft immediately   May stick graft on designated area only:   X Do not stick left AV fistula for 12 weeks    If you have any questions, please  call the office at [phone].

## 2022-10-25 NOTE — Transfer of Care (Signed)
Immediate Anesthesia Transfer of Care Note  Patient: Francisco Baldwin  Procedure(s) Performed: LEFT ARM FIRST STAGE BASILIC VEIN (AV) FISTULA CREATION (Left: Arm Upper)  Patient Location: PACU  Anesthesia Type:MAC and Regional  Level of Consciousness: drowsy and patient cooperative  Airway & Oxygen Therapy: Patient Spontanous Breathing  Post-op Assessment: Report given to RN and Post -op Vital signs reviewed and stable  Post vital signs: Reviewed and stable  Last Vitals:  Vitals Value Taken Time  BP 140/66 10/25/22 1023  Temp    Pulse 75 10/25/22 1024  Resp 15 10/25/22 1024  SpO2 100 % 10/25/22 1024  Vitals shown include unfiled device data.  Last Pain:  Vitals:   10/25/22 0700  TempSrc: Oral         Complications: No notable events documented.

## 2022-10-25 NOTE — Op Note (Signed)
DATE OF SERVICE: 10/25/2022  PATIENT:  Francisco Baldwin  67 y.o. male  PRE-OPERATIVE DIAGNOSIS:  ESRD  POST-OPERATIVE DIAGNOSIS:  Same  PROCEDURE:   Left first stage brachiobasilic arteriovenous fistula  SURGEON:  Surgeons and Role:    * Leonie Douglas, MD - Primary  ASSISTANT: Nathanial Rancher, PA-C  An experienced assistant was required given the complexity of this procedure and the standard of surgical care. My assistant helped with exposure through counter tension, suctioning, ligation and retraction to better visualize the surgical field.  My assistant expedited sewing during the case by following my sutures. Wherever I use the term "we" in the report, my assistant actively helped me with that portion of the procedure.  ANESTHESIA:   regional and MAC  EBL: minimal  BLOOD ADMINISTERED:none  DRAINS: none   LOCAL MEDICATIONS USED:  NONE  SPECIMEN:  none  COUNTS: confirmed correct .  TOURNIQUET:  none  PATIENT DISPOSITION:  PACU - hemodynamically stable.   Delay start of Pharmacological VTE agent (>24hrs) due to surgical blood loss or risk of bleeding: no  INDICATION FOR PROCEDURE: ATLAI ALVELO is a 67 y.o. male with ESRD in need of permanent dialysis access. After careful discussion of risks, benefits, and alternatives the patient was offered AVF vs. AVG. The patient understood and wished to proceed.  OPERATIVE FINDINGS: basilic vein healthier than expected. Good technical result from fistula creation. Strong thrill at completion. Radial pulse at completion.  DESCRIPTION OF PROCEDURE: After identification of the patient in the pre-operative holding area, the patient was transferred to the operating room. The patient was positioned supine on the operating room table. Anesthesia was induced. The left arm was prepped and draped in standard fashion. A surgical pause was performed confirming correct patient, procedure, and operative location.  Using intraoperative ultrasound  the left brachial artery and basilic vein were mapped.  An incision was planned over the course of the two vessels to allow fistula creation in the antecubitum.  Incision was created.  Incision was carried down through subcutaneous tissue.  The aponeurosis of the biceps tendon was divided.  The brachial sheath was identified.  The brachial artery was skeletonized.  The artery was encircled with 2 Silastic Vesseloops.  Next attention was turned to the basilic vein.  This was identified in the medial arm in its typical position.  The vein was mobilized throughout the length of the incision to allow tension-free arteriovenous fistula creation.  The distal end of the vein was clamped with a right angle.  The proximal end of the vein was clamped with a bulldog.  The vein was transected distally.  The stump was oversewn with a 2-0 silk.  The cut end of the vein was spatulated and distended with a mosquito clamp.   The basilic vein was anastomosed to the brachial artery into side using continuous running suture of 6-0 Prolene.  Immediately prior to completion the anastomosis was flushed and de-aired.  The anastomosis was completed.  Clamps were released.  Hemostasis was achieved.  An audible bruit was heard in the fistula.  Palpable pulse was felt in the left wrist.  Stasis was achieved in the surgical bed.  The wound was closed with 3-0 Vicryl and 4-0 Monocryl.  Upon completion of the case instrument and sharps counts were confirmed correct. The patient was transferred to the  PACU in good condition. I was present for all portions of the procedure.  FOLLOW UP PLAN: Assuming a normal postoperative course, VVS PA  will see the patient in 6 weeks with AVF duplex.   Rande Brunt. Lenell Antu, MD [facility] Phone Number: [phone] 10/25/2022 10:19 AM

## 2022-10-25 NOTE — Anesthesia Postprocedure Evaluation (Signed)
Anesthesia Post Note  Patient: Francisco Baldwin  Procedure(s) Performed: LEFT ARM FIRST STAGE BASILIC VEIN (AV) FISTULA CREATION (Left: Arm Upper)     Patient location during evaluation: PACU Anesthesia Type: Regional and MAC Level of consciousness: awake and alert Pain management: pain level controlled Vital Signs Assessment: post-procedure vital signs reviewed and stable Respiratory status: spontaneous breathing, nonlabored ventilation, respiratory function stable and patient connected to nasal cannula oxygen Cardiovascular status: stable and blood pressure returned to baseline Postop Assessment: no apparent nausea or vomiting Anesthetic complications: no   No notable events documented.  Last Vitals:  Vitals:   10/25/22 1030 10/25/22 1045  BP: 121/60 (!) 118/54  Pulse: 76 72  Resp: 13 19  Temp:  36.7 C  SpO2: 95% 96%    Last Pain:  Vitals:   10/25/22 0700  TempSrc: Oral                 Earl Lites P Kevontay Burks

## 2022-10-25 NOTE — Anesthesia Procedure Notes (Signed)
Anesthesia Regional Block: Supraclavicular block   Pre-Anesthetic Checklist: , timeout performed,  Correct Patient, Correct Site, Correct Laterality,  Correct Procedure, Correct Position, site marked,  Risks and benefits discussed,  Surgical consent,  Pre-op evaluation,  At surgeon's request and post-op pain management  Laterality: Left  Prep: Dura Prep       Needles:  Injection technique: Single-shot  Needle Type: Echogenic Stimulator Needle     Needle Length: 5cm  Needle Gauge: 20     Additional Needles:   Procedures:,,,, ultrasound used (permanent image in chart),,    Narrative:  Start time: 10/25/2022 8:45 AM End time: 10/25/2022 8:50 AM Injection made incrementally with aspirations every 5 mL.  Performed by: Personally  Anesthesiologist: Atilano Median, DO  Additional Notes: Patient identified. Risks/Benefits/Options discussed with patient including but not limited to bleeding, infection, nerve damage, failed block, incomplete pain control. Patient expressed understanding and wished to proceed. All questions were answered. Sterile technique was used throughout the entire procedure. Please see nursing notes for vital signs. Aspirated in 5cc intervals with injection for negative confirmation. Patient was given instructions on fall risk and not to get out of bed. All questions and concerns addressed with instructions to call with any issues or inadequate analgesia.

## 2022-10-26 ENCOUNTER — Telehealth: Payer: Self-pay

## 2022-10-26 ENCOUNTER — Encounter (HOSPITAL_COMMUNITY): Payer: Self-pay | Admitting: Vascular Surgery

## 2022-10-26 DIAGNOSIS — D509 Iron deficiency anemia, unspecified: Secondary | ICD-10-CM | POA: Diagnosis not present

## 2022-10-26 DIAGNOSIS — N186 End stage renal disease: Secondary | ICD-10-CM | POA: Diagnosis not present

## 2022-10-26 DIAGNOSIS — Z992 Dependence on renal dialysis: Secondary | ICD-10-CM | POA: Diagnosis not present

## 2022-10-26 DIAGNOSIS — N2581 Secondary hyperparathyroidism of renal origin: Secondary | ICD-10-CM | POA: Diagnosis not present

## 2022-10-26 DIAGNOSIS — D631 Anemia in chronic kidney disease: Secondary | ICD-10-CM | POA: Diagnosis not present

## 2022-10-26 NOTE — Telephone Encounter (Signed)
   [facility] Pre-operative Risk Assessment    Request for surgical clearance:  What type of surgery is being performed? Colonoscopy   When is this surgery scheduled? TBD   What type of clearance is required (medical clearance vs. Pharmacy clearance to hold med vs. Both)? Pharmacy   Are there any medications that need to be held prior to surgery and how long?Eliquis  hold 2 days prior   Practice name and name of physician performing surgery? Dr. Devona Konig at [facility]   What is your office phone number: [phone]    7.   What is your office fax number: [phone]  8.   Anesthesia type (None, local, MAC, general) ? Not specified   Francisco Baldwin  10/26/2022, 6:53 AM  _________________________________________________________________   (provider comments below)

## 2022-10-29 DIAGNOSIS — Z992 Dependence on renal dialysis: Secondary | ICD-10-CM | POA: Diagnosis not present

## 2022-10-29 DIAGNOSIS — D509 Iron deficiency anemia, unspecified: Secondary | ICD-10-CM | POA: Diagnosis not present

## 2022-10-29 DIAGNOSIS — D631 Anemia in chronic kidney disease: Secondary | ICD-10-CM | POA: Diagnosis not present

## 2022-10-29 DIAGNOSIS — N186 End stage renal disease: Secondary | ICD-10-CM | POA: Diagnosis not present

## 2022-10-29 DIAGNOSIS — N2581 Secondary hyperparathyroidism of renal origin: Secondary | ICD-10-CM | POA: Diagnosis not present

## 2022-10-29 NOTE — Telephone Encounter (Signed)
   Name: MAXXIM LASHOMB  DOB: 06/04/1955  MRN: [id number]   Primary Cardiologist: None  Chart reviewed as part of pre-operative protocol coverage.   Eliquis prescribed by a noncardiology provider therefore recommendations for holding deferred to prescribing provider.    I will route this recommendation to the requesting party via Epic fax function and remove from pre-op pool. Please call with questions.  Carlos Levering, NP 10/29/2022, 12:33 PM

## 2022-10-30 ENCOUNTER — Telehealth: Payer: Self-pay

## 2022-10-30 NOTE — Telephone Encounter (Signed)
Spoke with Curt Bears, notified of results

## 2022-10-30 NOTE — Telephone Encounter (Signed)
-----   Message from Gypsy Balsam sent at 10/25/2022  1:12 PM EDT ----- Echocardiogram showing preserved ejection fraction overall looks good

## 2022-10-31 ENCOUNTER — Telehealth: Payer: Self-pay | Admitting: Cardiology

## 2022-10-31 DIAGNOSIS — N186 End stage renal disease: Secondary | ICD-10-CM | POA: Diagnosis not present

## 2022-10-31 DIAGNOSIS — D631 Anemia in chronic kidney disease: Secondary | ICD-10-CM | POA: Diagnosis not present

## 2022-10-31 DIAGNOSIS — D509 Iron deficiency anemia, unspecified: Secondary | ICD-10-CM | POA: Diagnosis not present

## 2022-10-31 DIAGNOSIS — N2581 Secondary hyperparathyroidism of renal origin: Secondary | ICD-10-CM | POA: Diagnosis not present

## 2022-10-31 DIAGNOSIS — Z992 Dependence on renal dialysis: Secondary | ICD-10-CM | POA: Diagnosis not present

## 2022-10-31 NOTE — Telephone Encounter (Signed)
Left message for daughter that pt should keep appt tomorrow for follow up

## 2022-10-31 NOTE — Telephone Encounter (Signed)
Patient's daughter is calling to get advice as to if the appt scheduled for tomorrow 08/08 is needed still since results came back okay. Requesting call back.

## 2022-11-01 ENCOUNTER — Ambulatory Visit: Payer: Medicare Other | Attending: Cardiology | Admitting: Cardiology

## 2022-11-01 DIAGNOSIS — I509 Heart failure, unspecified: Secondary | ICD-10-CM | POA: Diagnosis not present

## 2022-11-01 DIAGNOSIS — Z436 Encounter for attention to other artificial openings of urinary tract: Secondary | ICD-10-CM | POA: Diagnosis not present

## 2022-11-01 DIAGNOSIS — Z794 Long term (current) use of insulin: Secondary | ICD-10-CM | POA: Diagnosis not present

## 2022-11-01 DIAGNOSIS — K219 Gastro-esophageal reflux disease without esophagitis: Secondary | ICD-10-CM | POA: Diagnosis not present

## 2022-11-01 DIAGNOSIS — I959 Hypotension, unspecified: Secondary | ICD-10-CM | POA: Diagnosis not present

## 2022-11-01 DIAGNOSIS — E861 Hypovolemia: Secondary | ICD-10-CM | POA: Diagnosis not present

## 2022-11-01 DIAGNOSIS — D631 Anemia in chronic kidney disease: Secondary | ICD-10-CM | POA: Diagnosis not present

## 2022-11-01 DIAGNOSIS — M21372 Foot drop, left foot: Secondary | ICD-10-CM | POA: Diagnosis not present

## 2022-11-01 DIAGNOSIS — I69398 Other sequelae of cerebral infarction: Secondary | ICD-10-CM | POA: Diagnosis not present

## 2022-11-01 DIAGNOSIS — F32A Depression, unspecified: Secondary | ICD-10-CM | POA: Diagnosis not present

## 2022-11-01 DIAGNOSIS — E1122 Type 2 diabetes mellitus with diabetic chronic kidney disease: Secondary | ICD-10-CM | POA: Diagnosis not present

## 2022-11-01 DIAGNOSIS — Z992 Dependence on renal dialysis: Secondary | ICD-10-CM | POA: Diagnosis not present

## 2022-11-01 DIAGNOSIS — E878 Other disorders of electrolyte and fluid balance, not elsewhere classified: Secondary | ICD-10-CM | POA: Diagnosis not present

## 2022-11-01 DIAGNOSIS — E860 Dehydration: Secondary | ICD-10-CM | POA: Diagnosis not present

## 2022-11-01 DIAGNOSIS — R531 Weakness: Secondary | ICD-10-CM | POA: Diagnosis not present

## 2022-11-01 DIAGNOSIS — Z7689 Persons encountering health services in other specified circumstances: Secondary | ICD-10-CM | POA: Diagnosis not present

## 2022-11-01 DIAGNOSIS — I4510 Unspecified right bundle-branch block: Secondary | ICD-10-CM | POA: Diagnosis not present

## 2022-11-01 DIAGNOSIS — N186 End stage renal disease: Secondary | ICD-10-CM | POA: Diagnosis not present

## 2022-11-01 DIAGNOSIS — E039 Hypothyroidism, unspecified: Secondary | ICD-10-CM | POA: Diagnosis not present

## 2022-11-01 DIAGNOSIS — Z6830 Body mass index (BMI) 30.0-30.9, adult: Secondary | ICD-10-CM | POA: Diagnosis not present

## 2022-11-01 DIAGNOSIS — K9400 Colostomy complication, unspecified: Secondary | ICD-10-CM | POA: Diagnosis not present

## 2022-11-01 DIAGNOSIS — F419 Anxiety disorder, unspecified: Secondary | ICD-10-CM | POA: Diagnosis not present

## 2022-11-01 DIAGNOSIS — M21371 Foot drop, right foot: Secondary | ICD-10-CM | POA: Diagnosis not present

## 2022-11-01 DIAGNOSIS — E1142 Type 2 diabetes mellitus with diabetic polyneuropathy: Secondary | ICD-10-CM | POA: Diagnosis not present

## 2022-11-01 DIAGNOSIS — M5416 Radiculopathy, lumbar region: Secondary | ICD-10-CM | POA: Diagnosis not present

## 2022-11-01 DIAGNOSIS — I132 Hypertensive heart and chronic kidney disease with heart failure and with stage 5 chronic kidney disease, or end stage renal disease: Secondary | ICD-10-CM | POA: Diagnosis not present

## 2022-11-01 DIAGNOSIS — E785 Hyperlipidemia, unspecified: Secondary | ICD-10-CM | POA: Diagnosis not present

## 2022-11-02 DIAGNOSIS — N186 End stage renal disease: Secondary | ICD-10-CM | POA: Diagnosis not present

## 2022-11-02 DIAGNOSIS — D631 Anemia in chronic kidney disease: Secondary | ICD-10-CM | POA: Diagnosis not present

## 2022-11-02 DIAGNOSIS — N2581 Secondary hyperparathyroidism of renal origin: Secondary | ICD-10-CM | POA: Diagnosis not present

## 2022-11-02 DIAGNOSIS — Z992 Dependence on renal dialysis: Secondary | ICD-10-CM | POA: Diagnosis not present

## 2022-11-02 DIAGNOSIS — D509 Iron deficiency anemia, unspecified: Secondary | ICD-10-CM | POA: Diagnosis not present

## 2022-11-05 DIAGNOSIS — Z992 Dependence on renal dialysis: Secondary | ICD-10-CM | POA: Diagnosis not present

## 2022-11-05 DIAGNOSIS — N2581 Secondary hyperparathyroidism of renal origin: Secondary | ICD-10-CM | POA: Diagnosis not present

## 2022-11-05 DIAGNOSIS — D631 Anemia in chronic kidney disease: Secondary | ICD-10-CM | POA: Diagnosis not present

## 2022-11-05 DIAGNOSIS — N186 End stage renal disease: Secondary | ICD-10-CM | POA: Diagnosis not present

## 2022-11-05 DIAGNOSIS — D509 Iron deficiency anemia, unspecified: Secondary | ICD-10-CM | POA: Diagnosis not present

## 2022-11-06 DIAGNOSIS — I509 Heart failure, unspecified: Secondary | ICD-10-CM | POA: Diagnosis not present

## 2022-11-06 DIAGNOSIS — E1122 Type 2 diabetes mellitus with diabetic chronic kidney disease: Secondary | ICD-10-CM | POA: Diagnosis not present

## 2022-11-06 DIAGNOSIS — I132 Hypertensive heart and chronic kidney disease with heart failure and with stage 5 chronic kidney disease, or end stage renal disease: Secondary | ICD-10-CM | POA: Diagnosis not present

## 2022-11-06 DIAGNOSIS — R531 Weakness: Secondary | ICD-10-CM | POA: Diagnosis not present

## 2022-11-06 DIAGNOSIS — N186 End stage renal disease: Secondary | ICD-10-CM | POA: Diagnosis not present

## 2022-11-06 DIAGNOSIS — I69398 Other sequelae of cerebral infarction: Secondary | ICD-10-CM | POA: Diagnosis not present

## 2022-11-07 DIAGNOSIS — Z992 Dependence on renal dialysis: Secondary | ICD-10-CM | POA: Diagnosis not present

## 2022-11-07 DIAGNOSIS — D631 Anemia in chronic kidney disease: Secondary | ICD-10-CM | POA: Diagnosis not present

## 2022-11-07 DIAGNOSIS — N2581 Secondary hyperparathyroidism of renal origin: Secondary | ICD-10-CM | POA: Diagnosis not present

## 2022-11-07 DIAGNOSIS — N186 End stage renal disease: Secondary | ICD-10-CM | POA: Diagnosis not present

## 2022-11-07 DIAGNOSIS — E1122 Type 2 diabetes mellitus with diabetic chronic kidney disease: Secondary | ICD-10-CM | POA: Diagnosis not present

## 2022-11-07 DIAGNOSIS — D509 Iron deficiency anemia, unspecified: Secondary | ICD-10-CM | POA: Diagnosis not present

## 2022-11-09 DIAGNOSIS — N2581 Secondary hyperparathyroidism of renal origin: Secondary | ICD-10-CM | POA: Diagnosis not present

## 2022-11-09 DIAGNOSIS — D631 Anemia in chronic kidney disease: Secondary | ICD-10-CM | POA: Diagnosis not present

## 2022-11-09 DIAGNOSIS — N186 End stage renal disease: Secondary | ICD-10-CM | POA: Diagnosis not present

## 2022-11-09 DIAGNOSIS — D509 Iron deficiency anemia, unspecified: Secondary | ICD-10-CM | POA: Diagnosis not present

## 2022-11-09 DIAGNOSIS — Z992 Dependence on renal dialysis: Secondary | ICD-10-CM | POA: Diagnosis not present

## 2022-11-12 DIAGNOSIS — Z992 Dependence on renal dialysis: Secondary | ICD-10-CM | POA: Diagnosis not present

## 2022-11-12 DIAGNOSIS — D631 Anemia in chronic kidney disease: Secondary | ICD-10-CM | POA: Diagnosis not present

## 2022-11-12 DIAGNOSIS — N2581 Secondary hyperparathyroidism of renal origin: Secondary | ICD-10-CM | POA: Diagnosis not present

## 2022-11-12 DIAGNOSIS — D509 Iron deficiency anemia, unspecified: Secondary | ICD-10-CM | POA: Diagnosis not present

## 2022-11-12 DIAGNOSIS — N186 End stage renal disease: Secondary | ICD-10-CM | POA: Diagnosis not present

## 2022-11-13 ENCOUNTER — Other Ambulatory Visit: Payer: Self-pay | Admitting: Interventional Radiology

## 2022-11-13 DIAGNOSIS — I509 Heart failure, unspecified: Secondary | ICD-10-CM | POA: Diagnosis not present

## 2022-11-13 DIAGNOSIS — I132 Hypertensive heart and chronic kidney disease with heart failure and with stage 5 chronic kidney disease, or end stage renal disease: Secondary | ICD-10-CM | POA: Diagnosis not present

## 2022-11-13 DIAGNOSIS — I69398 Other sequelae of cerebral infarction: Secondary | ICD-10-CM | POA: Diagnosis not present

## 2022-11-13 DIAGNOSIS — E1122 Type 2 diabetes mellitus with diabetic chronic kidney disease: Secondary | ICD-10-CM | POA: Diagnosis not present

## 2022-11-13 DIAGNOSIS — R531 Weakness: Secondary | ICD-10-CM | POA: Diagnosis not present

## 2022-11-13 DIAGNOSIS — N186 End stage renal disease: Secondary | ICD-10-CM | POA: Diagnosis not present

## 2022-11-13 DIAGNOSIS — Z86718 Personal history of other venous thrombosis and embolism: Secondary | ICD-10-CM

## 2022-11-14 DIAGNOSIS — Z992 Dependence on renal dialysis: Secondary | ICD-10-CM | POA: Diagnosis not present

## 2022-11-14 DIAGNOSIS — D509 Iron deficiency anemia, unspecified: Secondary | ICD-10-CM | POA: Diagnosis not present

## 2022-11-14 DIAGNOSIS — D631 Anemia in chronic kidney disease: Secondary | ICD-10-CM | POA: Diagnosis not present

## 2022-11-14 DIAGNOSIS — N2581 Secondary hyperparathyroidism of renal origin: Secondary | ICD-10-CM | POA: Diagnosis not present

## 2022-11-14 DIAGNOSIS — N186 End stage renal disease: Secondary | ICD-10-CM | POA: Diagnosis not present

## 2022-11-15 ENCOUNTER — Other Ambulatory Visit: Payer: Self-pay

## 2022-11-15 DIAGNOSIS — N186 End stage renal disease: Secondary | ICD-10-CM

## 2022-11-16 DIAGNOSIS — N2581 Secondary hyperparathyroidism of renal origin: Secondary | ICD-10-CM | POA: Diagnosis not present

## 2022-11-16 DIAGNOSIS — D631 Anemia in chronic kidney disease: Secondary | ICD-10-CM | POA: Diagnosis not present

## 2022-11-16 DIAGNOSIS — Z992 Dependence on renal dialysis: Secondary | ICD-10-CM | POA: Diagnosis not present

## 2022-11-16 DIAGNOSIS — D509 Iron deficiency anemia, unspecified: Secondary | ICD-10-CM | POA: Diagnosis not present

## 2022-11-16 DIAGNOSIS — N186 End stage renal disease: Secondary | ICD-10-CM | POA: Diagnosis not present

## 2022-11-19 DIAGNOSIS — D509 Iron deficiency anemia, unspecified: Secondary | ICD-10-CM | POA: Diagnosis not present

## 2022-11-19 DIAGNOSIS — Z992 Dependence on renal dialysis: Secondary | ICD-10-CM | POA: Diagnosis not present

## 2022-11-19 DIAGNOSIS — N186 End stage renal disease: Secondary | ICD-10-CM | POA: Diagnosis not present

## 2022-11-19 DIAGNOSIS — D631 Anemia in chronic kidney disease: Secondary | ICD-10-CM | POA: Diagnosis not present

## 2022-11-19 DIAGNOSIS — N2581 Secondary hyperparathyroidism of renal origin: Secondary | ICD-10-CM | POA: Diagnosis not present

## 2022-11-20 DIAGNOSIS — R531 Weakness: Secondary | ICD-10-CM | POA: Diagnosis not present

## 2022-11-20 DIAGNOSIS — N186 End stage renal disease: Secondary | ICD-10-CM | POA: Diagnosis not present

## 2022-11-20 DIAGNOSIS — I132 Hypertensive heart and chronic kidney disease with heart failure and with stage 5 chronic kidney disease, or end stage renal disease: Secondary | ICD-10-CM | POA: Diagnosis not present

## 2022-11-20 DIAGNOSIS — E1122 Type 2 diabetes mellitus with diabetic chronic kidney disease: Secondary | ICD-10-CM | POA: Diagnosis not present

## 2022-11-20 DIAGNOSIS — I69398 Other sequelae of cerebral infarction: Secondary | ICD-10-CM | POA: Diagnosis not present

## 2022-11-20 DIAGNOSIS — I509 Heart failure, unspecified: Secondary | ICD-10-CM | POA: Diagnosis not present

## 2022-11-21 DIAGNOSIS — D509 Iron deficiency anemia, unspecified: Secondary | ICD-10-CM | POA: Diagnosis not present

## 2022-11-21 DIAGNOSIS — D631 Anemia in chronic kidney disease: Secondary | ICD-10-CM | POA: Diagnosis not present

## 2022-11-21 DIAGNOSIS — N186 End stage renal disease: Secondary | ICD-10-CM | POA: Diagnosis not present

## 2022-11-21 DIAGNOSIS — Z992 Dependence on renal dialysis: Secondary | ICD-10-CM | POA: Diagnosis not present

## 2022-11-21 DIAGNOSIS — N2581 Secondary hyperparathyroidism of renal origin: Secondary | ICD-10-CM | POA: Diagnosis not present

## 2022-11-23 DIAGNOSIS — D509 Iron deficiency anemia, unspecified: Secondary | ICD-10-CM | POA: Diagnosis not present

## 2022-11-23 DIAGNOSIS — N186 End stage renal disease: Secondary | ICD-10-CM | POA: Diagnosis not present

## 2022-11-23 DIAGNOSIS — D631 Anemia in chronic kidney disease: Secondary | ICD-10-CM | POA: Diagnosis not present

## 2022-11-23 DIAGNOSIS — N2581 Secondary hyperparathyroidism of renal origin: Secondary | ICD-10-CM | POA: Diagnosis not present

## 2022-11-23 DIAGNOSIS — Z992 Dependence on renal dialysis: Secondary | ICD-10-CM | POA: Diagnosis not present

## 2022-11-25 DIAGNOSIS — E1122 Type 2 diabetes mellitus with diabetic chronic kidney disease: Secondary | ICD-10-CM | POA: Diagnosis not present

## 2022-11-25 DIAGNOSIS — Z992 Dependence on renal dialysis: Secondary | ICD-10-CM | POA: Diagnosis not present

## 2022-11-25 DIAGNOSIS — N186 End stage renal disease: Secondary | ICD-10-CM | POA: Diagnosis not present

## 2022-11-26 DIAGNOSIS — Z23 Encounter for immunization: Secondary | ICD-10-CM | POA: Diagnosis not present

## 2022-11-26 DIAGNOSIS — D509 Iron deficiency anemia, unspecified: Secondary | ICD-10-CM | POA: Diagnosis not present

## 2022-11-26 DIAGNOSIS — D631 Anemia in chronic kidney disease: Secondary | ICD-10-CM | POA: Diagnosis not present

## 2022-11-26 DIAGNOSIS — N186 End stage renal disease: Secondary | ICD-10-CM | POA: Diagnosis not present

## 2022-11-26 DIAGNOSIS — Z992 Dependence on renal dialysis: Secondary | ICD-10-CM | POA: Diagnosis not present

## 2022-11-26 DIAGNOSIS — N2581 Secondary hyperparathyroidism of renal origin: Secondary | ICD-10-CM | POA: Diagnosis not present

## 2022-11-27 ENCOUNTER — Ambulatory Visit
Admission: RE | Admit: 2022-11-27 | Discharge: 2022-11-27 | Disposition: A | Discharge status: Home / Self Care | Payer: Medicare Other | Source: Ambulatory Visit | Attending: Interventional Radiology | Admitting: Interventional Radiology

## 2022-11-27 ENCOUNTER — Ambulatory Visit (INDEPENDENT_AMBULATORY_CARE_PROVIDER_SITE_OTHER): Payer: Medicare Other | Admitting: Physician Assistant

## 2022-11-27 ENCOUNTER — Ambulatory Visit (HOSPITAL_COMMUNITY)
Admission: RE | Admit: 2022-11-27 | Discharge: 2022-11-27 | Disposition: A | Discharge status: Home / Self Care | Payer: Medicare Other | Source: Ambulatory Visit | Attending: Vascular Surgery | Admitting: Vascular Surgery

## 2022-11-27 VITALS — BP 174/66 | HR 65 | Temp 97.9°F | Resp 18 | Ht 66.0 in | Wt 160.0 lb

## 2022-11-27 DIAGNOSIS — Z86718 Personal history of other venous thrombosis and embolism: Secondary | ICD-10-CM | POA: Diagnosis not present

## 2022-11-27 DIAGNOSIS — N186 End stage renal disease: Secondary | ICD-10-CM | POA: Diagnosis not present

## 2022-11-27 DIAGNOSIS — Z992 Dependence on renal dialysis: Secondary | ICD-10-CM

## 2022-11-27 NOTE — Progress Notes (Signed)
POST OPERATIVE OFFICE NOTE    CC:  F/u for surgery  HPI:  This is a 67 y.o. male who is s/p first stage Basilic av fistula  on 10/25/22 by Dr. Lenell Antu.   He has history of being admitted at [facility] critically ill with PD peritonitis. He ultimately suffered a sigmoid perforation and required laparotomy. He had a prolonged ICU and hospital course. He has a working colostomy and feeding tube.    Pt returns today for follow up.  His son is with him and is interpreting because he dose not speak [place].  He denies pain, loss of motor or sensation.  He has a working right internal jugular TDC   Allergies  Allergen Reactions   Penicillins Rash    Mild cutaneous reaction without swelling or trouble breathing. Likely reasonable to give cephalexin for pre-transplant prophylaxis.      Current Outpatient Medications  Medication Sig Dispense Refill   apixaban (ELIQUIS) 2.5 MG TABS tablet Take 2.5 mg by mouth 2 (two) times daily.     Insulin Glargine Solostar (LANTUS) 100 UNIT/ML Solostar Pen Inject 30 Units into the skin at bedtime. Hold if blood sugar less than 150     insulin lispro (HUMALOG KWIKPEN) 100 UNIT/ML KwikPen Inject 5 Units into the skin 4 (four) times daily as needed (If blood sugar is above 150).     levothyroxine (SYNTHROID) 100 MCG tablet Take 100 mcg by mouth daily before breakfast.     lisinopril (ZESTRIL) 20 MG tablet Take 20 mg by mouth See admin instructions. Take ONLY on Sun, Tues, Thurs and Sat if having high blood pressure     midodrine (PROAMATINE) 10 MG tablet Take 10 mg by mouth 3 (three) times a week. M, W, F     Nutritional Supplements (FEEDING SUPPLEMENT, NEPRO CARB STEADY,) LIQD Take 237 mLs by mouth daily as needed (Supplement).     oxybutynin (DITROPAN) 5 MG tablet Take 5 mg by mouth in the morning and at bedtime.     pantoprazole (PROTONIX) 40 MG tablet Take 40 mg by mouth 2 (two) times daily before a meal.     VELPHORO 500 MG chewable tablet  Chew 250 mg by mouth 3 (three) times daily with meals.     oxyCODONE-acetaminophen (PERCOCET) 5-325 MG tablet Take 1 tablet by mouth every 6 (six) hours as needed for severe pain. (Patient not taking: Reported on 11/27/2022) 10 tablet 0   No current facility-administered medications for this visit.     ROS:  See HPI  Physical Exam:   Findings:  +--------------------+----------+-----------------+--------+  AVF                PSV (cm/s)Flow Vol (mL/min)Comments  +--------------------+----------+-----------------+--------+  Native artery inflow   209           742                 +--------------------+----------+-----------------+--------+  AVF Anastomosis        513                               +--------------------+----------+-----------------+--------+     +------------+----------+-------------+----------+--------+  OUTFLOW VEINPSV (cm/s)Diameter (cm)Depth (cm)Describe  +------------+----------+-------------+----------+--------+  Prox UA        156        0.69        1.30             +------------+----------+-------------+----------+--------+  Mid UA  258        0.67        0.91    valve    +------------+----------+-------------+----------+--------+  Dist UA        160        0.63        0.74             +------------+----------+-------------+----------+--------+  AC Fossa       820        0.50        0.68             +------------+----------+-------------+----------+--------+     Summary:  Patent left BVT.  Flow volume 742 ml/min.  No significant stenosis by ratio. Velocity >400 at anastomosis and in the  antecubitum.      Incision:  well healed  Extremities:  palpable radial pulse and thrill at the fistula anastomosis Neuro: intact equal B UE     Assessment/Plan:  This is a 67 y.o. male who is s/p:first stage basilic AV fistula  The fistula has good flow vol. 742 ml/sec.  The diameter is > 0.6.  He will be  scheduled for a second stage basilic transposition with Dr. Lenell Antu.  He is on Eliquis and has HD MWF.  The Son states he would prefer a Thursday surgery if possible.     Mosetta Pigeon PA-C [facility] [phone]   Clinic MD:  Lenell Antu

## 2022-11-27 NOTE — H&P (View-Only) (Signed)
POST OPERATIVE OFFICE NOTE    CC:  F/u for surgery  HPI:  This is a 67 y.o. male who is s/p first stage Basilic av fistula  on 10/25/22 by Dr. Lenell Antu.   He has history of being admitted at [facility] critically ill with PD peritonitis. He ultimately suffered a sigmoid perforation and required laparotomy. He had a prolonged ICU and hospital course. He has a working colostomy and feeding tube.    Pt returns today for follow up.  His son is with him and is interpreting because he dose not speak [place].  He denies pain, loss of motor or sensation.  He has a working right internal jugular TDC   Allergies  Allergen Reactions   Penicillins Rash    Mild cutaneous reaction without swelling or trouble breathing. Likely reasonable to give cephalexin for pre-transplant prophylaxis.      Current Outpatient Medications  Medication Sig Dispense Refill   apixaban (ELIQUIS) 2.5 MG TABS tablet Take 2.5 mg by mouth 2 (two) times daily.     Insulin Glargine Solostar (LANTUS) 100 UNIT/ML Solostar Pen Inject 30 Units into the skin at bedtime. Hold if blood sugar less than 150     insulin lispro (HUMALOG KWIKPEN) 100 UNIT/ML KwikPen Inject 5 Units into the skin 4 (four) times daily as needed (If blood sugar is above 150).     levothyroxine (SYNTHROID) 100 MCG tablet Take 100 mcg by mouth daily before breakfast.     lisinopril (ZESTRIL) 20 MG tablet Take 20 mg by mouth See admin instructions. Take ONLY on Sun, Tues, Thurs and Sat if having high blood pressure     midodrine (PROAMATINE) 10 MG tablet Take 10 mg by mouth 3 (three) times a week. M, W, F     Nutritional Supplements (FEEDING SUPPLEMENT, NEPRO CARB STEADY,) LIQD Take 237 mLs by mouth daily as needed (Supplement).     oxybutynin (DITROPAN) 5 MG tablet Take 5 mg by mouth in the morning and at bedtime.     pantoprazole (PROTONIX) 40 MG tablet Take 40 mg by mouth 2 (two) times daily before a meal.     VELPHORO 500 MG chewable tablet  Chew 250 mg by mouth 3 (three) times daily with meals.     oxyCODONE-acetaminophen (PERCOCET) 5-325 MG tablet Take 1 tablet by mouth every 6 (six) hours as needed for severe pain. (Patient not taking: Reported on 11/27/2022) 10 tablet 0   No current facility-administered medications for this visit.     ROS:  See HPI  Physical Exam:   Findings:  +--------------------+----------+-----------------+--------+  AVF                PSV (cm/s)Flow Vol (mL/min)Comments  +--------------------+----------+-----------------+--------+  Native artery inflow   209           742                 +--------------------+----------+-----------------+--------+  AVF Anastomosis        513                               +--------------------+----------+-----------------+--------+     +------------+----------+-------------+----------+--------+  OUTFLOW VEINPSV (cm/s)Diameter (cm)Depth (cm)Describe  +------------+----------+-------------+----------+--------+  Prox UA        156        0.69        1.30             +------------+----------+-------------+----------+--------+  Mid UA  258        0.67        0.91    valve    +------------+----------+-------------+----------+--------+  Dist UA        160        0.63        0.74             +------------+----------+-------------+----------+--------+  AC Fossa       820        0.50        0.68             +------------+----------+-------------+----------+--------+     Summary:  Patent left BVT.  Flow volume 742 ml/min.  No significant stenosis by ratio. Velocity >400 at anastomosis and in the  antecubitum.      Incision:  well healed  Extremities:  palpable radial pulse and thrill at the fistula anastomosis Neuro: intact equal B UE     Assessment/Plan:  This is a 67 y.o. male who is s/p:first stage basilic AV fistula  The fistula has good flow vol. 742 ml/sec.  The diameter is > 0.6.  He will be  scheduled for a second stage basilic transposition with Dr. Lenell Antu.  He is on Eliquis and has HD MWF.  The Son states he would prefer a Thursday surgery if possible.     Mosetta Pigeon PA-C [facility] [phone]   Clinic MD:  Lenell Antu

## 2022-11-28 DIAGNOSIS — D631 Anemia in chronic kidney disease: Secondary | ICD-10-CM | POA: Diagnosis not present

## 2022-11-28 DIAGNOSIS — D509 Iron deficiency anemia, unspecified: Secondary | ICD-10-CM | POA: Diagnosis not present

## 2022-11-28 DIAGNOSIS — Z23 Encounter for immunization: Secondary | ICD-10-CM | POA: Diagnosis not present

## 2022-11-28 DIAGNOSIS — Z992 Dependence on renal dialysis: Secondary | ICD-10-CM | POA: Diagnosis not present

## 2022-11-28 DIAGNOSIS — N186 End stage renal disease: Secondary | ICD-10-CM | POA: Diagnosis not present

## 2022-11-28 DIAGNOSIS — N2581 Secondary hyperparathyroidism of renal origin: Secondary | ICD-10-CM | POA: Diagnosis not present

## 2022-11-30 ENCOUNTER — Other Ambulatory Visit: Payer: Self-pay

## 2022-11-30 DIAGNOSIS — N2581 Secondary hyperparathyroidism of renal origin: Secondary | ICD-10-CM | POA: Diagnosis not present

## 2022-11-30 DIAGNOSIS — Z992 Dependence on renal dialysis: Secondary | ICD-10-CM | POA: Diagnosis not present

## 2022-11-30 DIAGNOSIS — I132 Hypertensive heart and chronic kidney disease with heart failure and with stage 5 chronic kidney disease, or end stage renal disease: Secondary | ICD-10-CM | POA: Diagnosis not present

## 2022-11-30 DIAGNOSIS — I69398 Other sequelae of cerebral infarction: Secondary | ICD-10-CM | POA: Diagnosis not present

## 2022-11-30 DIAGNOSIS — Z23 Encounter for immunization: Secondary | ICD-10-CM | POA: Diagnosis not present

## 2022-11-30 DIAGNOSIS — D509 Iron deficiency anemia, unspecified: Secondary | ICD-10-CM | POA: Diagnosis not present

## 2022-11-30 DIAGNOSIS — R531 Weakness: Secondary | ICD-10-CM | POA: Diagnosis not present

## 2022-11-30 DIAGNOSIS — E1122 Type 2 diabetes mellitus with diabetic chronic kidney disease: Secondary | ICD-10-CM | POA: Diagnosis not present

## 2022-11-30 DIAGNOSIS — I509 Heart failure, unspecified: Secondary | ICD-10-CM | POA: Diagnosis not present

## 2022-11-30 DIAGNOSIS — D631 Anemia in chronic kidney disease: Secondary | ICD-10-CM | POA: Diagnosis not present

## 2022-11-30 DIAGNOSIS — N186 End stage renal disease: Secondary | ICD-10-CM | POA: Diagnosis not present

## 2022-12-01 DIAGNOSIS — R531 Weakness: Secondary | ICD-10-CM | POA: Diagnosis not present

## 2022-12-01 DIAGNOSIS — E861 Hypovolemia: Secondary | ICD-10-CM | POA: Diagnosis not present

## 2022-12-01 DIAGNOSIS — Z794 Long term (current) use of insulin: Secondary | ICD-10-CM | POA: Diagnosis not present

## 2022-12-01 DIAGNOSIS — E039 Hypothyroidism, unspecified: Secondary | ICD-10-CM | POA: Diagnosis not present

## 2022-12-01 DIAGNOSIS — E860 Dehydration: Secondary | ICD-10-CM | POA: Diagnosis not present

## 2022-12-01 DIAGNOSIS — I69398 Other sequelae of cerebral infarction: Secondary | ICD-10-CM | POA: Diagnosis not present

## 2022-12-01 DIAGNOSIS — D631 Anemia in chronic kidney disease: Secondary | ICD-10-CM | POA: Diagnosis not present

## 2022-12-01 DIAGNOSIS — F419 Anxiety disorder, unspecified: Secondary | ICD-10-CM | POA: Diagnosis not present

## 2022-12-01 DIAGNOSIS — N186 End stage renal disease: Secondary | ICD-10-CM | POA: Diagnosis not present

## 2022-12-01 DIAGNOSIS — Z992 Dependence on renal dialysis: Secondary | ICD-10-CM | POA: Diagnosis not present

## 2022-12-01 DIAGNOSIS — M5416 Radiculopathy, lumbar region: Secondary | ICD-10-CM | POA: Diagnosis not present

## 2022-12-01 DIAGNOSIS — K9400 Colostomy complication, unspecified: Secondary | ICD-10-CM | POA: Diagnosis not present

## 2022-12-01 DIAGNOSIS — K219 Gastro-esophageal reflux disease without esophagitis: Secondary | ICD-10-CM | POA: Diagnosis not present

## 2022-12-01 DIAGNOSIS — F32A Depression, unspecified: Secondary | ICD-10-CM | POA: Diagnosis not present

## 2022-12-01 DIAGNOSIS — I4510 Unspecified right bundle-branch block: Secondary | ICD-10-CM | POA: Diagnosis not present

## 2022-12-01 DIAGNOSIS — E1122 Type 2 diabetes mellitus with diabetic chronic kidney disease: Secondary | ICD-10-CM | POA: Diagnosis not present

## 2022-12-01 DIAGNOSIS — M21372 Foot drop, left foot: Secondary | ICD-10-CM | POA: Diagnosis not present

## 2022-12-01 DIAGNOSIS — E878 Other disorders of electrolyte and fluid balance, not elsewhere classified: Secondary | ICD-10-CM | POA: Diagnosis not present

## 2022-12-01 DIAGNOSIS — I509 Heart failure, unspecified: Secondary | ICD-10-CM | POA: Diagnosis not present

## 2022-12-01 DIAGNOSIS — E785 Hyperlipidemia, unspecified: Secondary | ICD-10-CM | POA: Diagnosis not present

## 2022-12-01 DIAGNOSIS — I959 Hypotension, unspecified: Secondary | ICD-10-CM | POA: Diagnosis not present

## 2022-12-01 DIAGNOSIS — Z436 Encounter for attention to other artificial openings of urinary tract: Secondary | ICD-10-CM | POA: Diagnosis not present

## 2022-12-01 DIAGNOSIS — M21371 Foot drop, right foot: Secondary | ICD-10-CM | POA: Diagnosis not present

## 2022-12-01 DIAGNOSIS — I132 Hypertensive heart and chronic kidney disease with heart failure and with stage 5 chronic kidney disease, or end stage renal disease: Secondary | ICD-10-CM | POA: Diagnosis not present

## 2022-12-01 DIAGNOSIS — E1142 Type 2 diabetes mellitus with diabetic polyneuropathy: Secondary | ICD-10-CM | POA: Diagnosis not present

## 2022-12-03 ENCOUNTER — Ambulatory Visit
Admission: RE | Admit: 2022-12-03 | Discharge: 2022-12-03 | Disposition: A | Discharge status: Home / Self Care | Payer: Medicare Other | Source: Ambulatory Visit | Attending: Interventional Radiology

## 2022-12-03 DIAGNOSIS — D631 Anemia in chronic kidney disease: Secondary | ICD-10-CM | POA: Diagnosis not present

## 2022-12-03 DIAGNOSIS — N2581 Secondary hyperparathyroidism of renal origin: Secondary | ICD-10-CM | POA: Diagnosis not present

## 2022-12-03 DIAGNOSIS — Z23 Encounter for immunization: Secondary | ICD-10-CM | POA: Diagnosis not present

## 2022-12-03 DIAGNOSIS — Z992 Dependence on renal dialysis: Secondary | ICD-10-CM | POA: Diagnosis not present

## 2022-12-03 DIAGNOSIS — N186 End stage renal disease: Secondary | ICD-10-CM | POA: Diagnosis not present

## 2022-12-03 DIAGNOSIS — Z86718 Personal history of other venous thrombosis and embolism: Secondary | ICD-10-CM

## 2022-12-03 DIAGNOSIS — D509 Iron deficiency anemia, unspecified: Secondary | ICD-10-CM | POA: Diagnosis not present

## 2022-12-04 DIAGNOSIS — E1169 Type 2 diabetes mellitus with other specified complication: Secondary | ICD-10-CM | POA: Diagnosis not present

## 2022-12-04 DIAGNOSIS — E785 Hyperlipidemia, unspecified: Secondary | ICD-10-CM | POA: Diagnosis not present

## 2022-12-04 DIAGNOSIS — Z6830 Body mass index (BMI) 30.0-30.9, adult: Secondary | ICD-10-CM | POA: Diagnosis not present

## 2022-12-04 DIAGNOSIS — I10 Essential (primary) hypertension: Secondary | ICD-10-CM | POA: Diagnosis not present

## 2022-12-04 DIAGNOSIS — N186 End stage renal disease: Secondary | ICD-10-CM | POA: Diagnosis not present

## 2022-12-05 ENCOUNTER — Encounter (HOSPITAL_COMMUNITY): Payer: Self-pay | Admitting: Vascular Surgery

## 2022-12-05 ENCOUNTER — Other Ambulatory Visit: Payer: Self-pay

## 2022-12-05 DIAGNOSIS — D509 Iron deficiency anemia, unspecified: Secondary | ICD-10-CM | POA: Diagnosis not present

## 2022-12-05 DIAGNOSIS — Z23 Encounter for immunization: Secondary | ICD-10-CM | POA: Diagnosis not present

## 2022-12-05 DIAGNOSIS — Z992 Dependence on renal dialysis: Secondary | ICD-10-CM | POA: Diagnosis not present

## 2022-12-05 DIAGNOSIS — D631 Anemia in chronic kidney disease: Secondary | ICD-10-CM | POA: Diagnosis not present

## 2022-12-05 DIAGNOSIS — N186 End stage renal disease: Secondary | ICD-10-CM | POA: Diagnosis not present

## 2022-12-05 DIAGNOSIS — N2581 Secondary hyperparathyroidism of renal origin: Secondary | ICD-10-CM | POA: Diagnosis not present

## 2022-12-06 ENCOUNTER — Other Ambulatory Visit: Payer: Self-pay

## 2022-12-06 ENCOUNTER — Ambulatory Visit (HOSPITAL_COMMUNITY): Payer: Medicare Other

## 2022-12-06 ENCOUNTER — Ambulatory Visit (HOSPITAL_COMMUNITY)
Admission: RE | Admit: 2022-12-06 | Discharge: 2022-12-06 | Disposition: A | Discharge status: Home / Self Care | Payer: Medicare Other | Attending: Vascular Surgery | Admitting: Vascular Surgery

## 2022-12-06 ENCOUNTER — Encounter (HOSPITAL_COMMUNITY): Payer: Self-pay | Admitting: Vascular Surgery

## 2022-12-06 ENCOUNTER — Encounter (HOSPITAL_COMMUNITY)
Admission: RE | Disposition: A | Discharge status: Home / Self Care | Payer: Self-pay | Source: Home / Self Care | Attending: Vascular Surgery

## 2022-12-06 DIAGNOSIS — K219 Gastro-esophageal reflux disease without esophagitis: Secondary | ICD-10-CM | POA: Insufficient documentation

## 2022-12-06 DIAGNOSIS — Z86718 Personal history of other venous thrombosis and embolism: Secondary | ICD-10-CM | POA: Insufficient documentation

## 2022-12-06 DIAGNOSIS — E039 Hypothyroidism, unspecified: Secondary | ICD-10-CM | POA: Diagnosis not present

## 2022-12-06 DIAGNOSIS — Z87891 Personal history of nicotine dependence: Secondary | ICD-10-CM

## 2022-12-06 DIAGNOSIS — Z794 Long term (current) use of insulin: Secondary | ICD-10-CM | POA: Diagnosis not present

## 2022-12-06 DIAGNOSIS — I120 Hypertensive chronic kidney disease with stage 5 chronic kidney disease or end stage renal disease: Secondary | ICD-10-CM | POA: Diagnosis not present

## 2022-12-06 DIAGNOSIS — Z992 Dependence on renal dialysis: Secondary | ICD-10-CM | POA: Diagnosis not present

## 2022-12-06 DIAGNOSIS — N186 End stage renal disease: Secondary | ICD-10-CM | POA: Diagnosis not present

## 2022-12-06 DIAGNOSIS — Z933 Colostomy status: Secondary | ICD-10-CM | POA: Insufficient documentation

## 2022-12-06 DIAGNOSIS — E1122 Type 2 diabetes mellitus with diabetic chronic kidney disease: Secondary | ICD-10-CM | POA: Insufficient documentation

## 2022-12-06 DIAGNOSIS — G9341 Metabolic encephalopathy: Secondary | ICD-10-CM | POA: Diagnosis not present

## 2022-12-06 DIAGNOSIS — Z7901 Long term (current) use of anticoagulants: Secondary | ICD-10-CM | POA: Diagnosis not present

## 2022-12-06 DIAGNOSIS — Z978 Presence of other specified devices: Secondary | ICD-10-CM | POA: Insufficient documentation

## 2022-12-06 DIAGNOSIS — N185 Chronic kidney disease, stage 5: Secondary | ICD-10-CM | POA: Diagnosis not present

## 2022-12-06 DIAGNOSIS — Z79890 Hormone replacement therapy (postmenopausal): Secondary | ICD-10-CM | POA: Diagnosis not present

## 2022-12-06 HISTORY — PX: BASCILIC VEIN TRANSPOSITION: SHX5742

## 2022-12-06 LAB — POCT I-STAT, CHEM 8
BUN: 11 mg/dL (ref 8–23)
Calcium, Ion: 1.13 mmol/L — ABNORMAL LOW (ref 1.15–1.40)
Chloride: 90 mmol/L — ABNORMAL LOW (ref 98–111)
Creatinine, Ser: 5 mg/dL — ABNORMAL HIGH (ref 0.61–1.24)
Glucose, Bld: 109 mg/dL — ABNORMAL HIGH (ref 70–99)
HCT: 36 % — ABNORMAL LOW (ref 39.0–52.0)
Hemoglobin: 12.2 g/dL — ABNORMAL LOW (ref 13.0–17.0)
Potassium: 4 mmol/L (ref 3.5–5.1)
Sodium: 132 mmol/L — ABNORMAL LOW (ref 135–145)
TCO2: 31 mmol/L (ref 22–32)

## 2022-12-06 LAB — GLUCOSE, CAPILLARY
Glucose-Capillary: 109 mg/dL — ABNORMAL HIGH (ref 70–99)
Glucose-Capillary: 114 mg/dL — ABNORMAL HIGH (ref 70–99)
Glucose-Capillary: 116 mg/dL — ABNORMAL HIGH (ref 70–99)

## 2022-12-06 SURGERY — TRANSPOSITION, VEIN, BASILIC
Anesthesia: Regional | Site: Arm Upper | Laterality: Left

## 2022-12-06 MED ORDER — FENTANYL CITRATE (PF) 100 MCG/2ML IJ SOLN
INTRAMUSCULAR | Status: AC
  Administered 2022-12-06: 50 ug via INTRAVENOUS
  Filled 2022-12-06: qty 2

## 2022-12-06 MED ORDER — INSULIN ASPART 100 UNIT/ML IJ SOLN: 0.0000 [IU] | INTRAMUSCULAR | Status: DC | PRN

## 2022-12-06 MED ORDER — MIDAZOLAM HCL 2 MG/2ML IJ SOLN: 1.0000 mg | Freq: Once | INTRAMUSCULAR | Status: AC

## 2022-12-06 MED ORDER — PROPOFOL 500 MG/50ML IV EMUL
INTRAVENOUS | Status: DC | PRN
  Administered 2022-12-06: 125 ug/kg/min via INTRAVENOUS

## 2022-12-06 MED ORDER — LIDOCAINE HCL (PF) 2 % IJ SOLN
INTRAMUSCULAR | Status: DC | PRN
Start: 2022-12-06 — End: 2022-12-06
  Administered 2022-12-06: 10 mL

## 2022-12-06 MED ORDER — CHLORHEXIDINE GLUCONATE 0.12 % MT SOLN
15.0000 mL | Freq: Once | OROMUCOSAL | Status: AC
  Administered 2022-12-06: 15 mL via OROMUCOSAL
  Filled 2022-12-06: qty 15

## 2022-12-06 MED ORDER — PROPOFOL 10 MG/ML IV BOLUS
INTRAVENOUS | Status: AC
  Filled 2022-12-06: qty 20

## 2022-12-06 MED ORDER — FENTANYL CITRATE (PF) 100 MCG/2ML IJ SOLN: 25.0000 ug | INTRAMUSCULAR | Status: DC | PRN

## 2022-12-06 MED ORDER — CHLORHEXIDINE GLUCONATE 4 % EX SOLN: 60.0000 mL | Freq: Once | CUTANEOUS | Status: DC

## 2022-12-06 MED ORDER — ORAL CARE MOUTH RINSE: 15.0000 mL | Freq: Once | OROMUCOSAL | Status: AC

## 2022-12-06 MED ORDER — FENTANYL CITRATE (PF) 250 MCG/5ML IJ SOLN
INTRAMUSCULAR | Status: AC
  Filled 2022-12-06: qty 5

## 2022-12-06 MED ORDER — MIDAZOLAM HCL 2 MG/2ML IJ SOLN
INTRAMUSCULAR | Status: AC
  Administered 2022-12-06: 1 mg via INTRAVENOUS
  Filled 2022-12-06: qty 2

## 2022-12-06 MED ORDER — ONDANSETRON HCL 4 MG/2ML IJ SOLN
INTRAMUSCULAR | Status: DC | PRN
  Administered 2022-12-06: 4 mg via INTRAVENOUS

## 2022-12-06 MED ORDER — FENTANYL CITRATE (PF) 100 MCG/2ML IJ SOLN: 50.0000 ug | Freq: Once | INTRAMUSCULAR | Status: AC

## 2022-12-06 MED ORDER — SODIUM CHLORIDE 0.9 % IV SOLN: INTRAVENOUS | Status: DC

## 2022-12-06 MED ORDER — VANCOMYCIN HCL IN DEXTROSE 1-5 GM/200ML-% IV SOLN
1000.0000 mg | INTRAVENOUS | Status: AC
  Administered 2022-12-06: 1000 mg via INTRAVENOUS
  Filled 2022-12-06: qty 200

## 2022-12-06 MED ORDER — 0.9 % SODIUM CHLORIDE (POUR BTL) OPTIME
TOPICAL | Status: DC | PRN
  Administered 2022-12-06: 1000 mL

## 2022-12-06 MED ORDER — APIXABAN 2.5 MG PO TABS: 2.5000 mg | ORAL_TABLET | Freq: Two times a day (BID) | ORAL | Status: AC

## 2022-12-06 MED ORDER — LIDOCAINE-EPINEPHRINE (PF) 1 %-1:200000 IJ SOLN
INTRAMUSCULAR | Status: AC
  Filled 2022-12-06: qty 30

## 2022-12-06 MED ORDER — OXYCODONE-ACETAMINOPHEN 5-325 MG PO TABS: 1.0000 | ORAL_TABLET | Freq: Four times a day (QID) | ORAL | 0 refills | Status: DC | PRN

## 2022-12-06 MED ORDER — ROPIVACAINE HCL 5 MG/ML IJ SOLN
INTRAMUSCULAR | Status: DC | PRN
Start: 2022-12-06 — End: 2022-12-06

## 2022-12-06 MED ORDER — ROPIVACAINE HCL 5 MG/ML IJ SOLN
INTRAMUSCULAR | Status: DC | PRN
  Administered 2022-12-06: 30 mL via PERINEURAL

## 2022-12-06 MED ORDER — EPHEDRINE SULFATE-NACL 50-0.9 MG/10ML-% IV SOSY
PREFILLED_SYRINGE | INTRAVENOUS | Status: DC | PRN
  Administered 2022-12-06: 10 mg via INTRAVENOUS

## 2022-12-06 MED ORDER — HEPARIN 6000 UNIT IRRIGATION SOLUTION
Status: DC | PRN
  Administered 2022-12-06: 1

## 2022-12-06 MED ORDER — HEPARIN 6000 UNIT IRRIGATION SOLUTION
Status: AC
  Filled 2022-12-06: qty 500

## 2022-12-06 MED ORDER — DROPERIDOL 2.5 MG/ML IJ SOLN: 0.6250 mg | Freq: Once | INTRAMUSCULAR | Status: DC | PRN

## 2022-12-06 SURGICAL SUPPLY — 36 items
APL PRP STRL LF DISP 70% ISPRP (MISCELLANEOUS) ×1
APL SKNCLS STERI-STRIP NONHPOA (GAUZE/BANDAGES/DRESSINGS) ×2
ARMBAND PINK RESTRICT EXTREMIT (MISCELLANEOUS) ×1 IMPLANT
BAG COUNTER SPONGE SURGICOUNT (BAG) ×1 IMPLANT
BAG SPNG CNTER NS LX DISP (BAG) ×1
BENZOIN TINCTURE PRP APPL 2/3 (GAUZE/BANDAGES/DRESSINGS) ×1 IMPLANT
CANISTER SUCT 3000ML PPV (MISCELLANEOUS) ×1 IMPLANT
CHLORAPREP W/TINT 26 (MISCELLANEOUS) ×1 IMPLANT
CLIP LIGATING EXTRA MED SLVR (CLIP) ×1 IMPLANT
CLIP LIGATING EXTRA SM BLUE (MISCELLANEOUS) ×1 IMPLANT
COVER PROBE W GEL 5X96 (DRAPES) ×1 IMPLANT
DRSG IV TEGADERM 3.5X4.5 STRL (GAUZE/BANDAGES/DRESSINGS) IMPLANT
ELECT REM PT RETURN 9FT ADLT (ELECTROSURGICAL) ×1
ELECTRODE REM PT RTRN 9FT ADLT (ELECTROSURGICAL) ×1 IMPLANT
GAUZE SPONGE 4X4 12PLY STRL (GAUZE/BANDAGES/DRESSINGS) IMPLANT
GLOVE BIO SURGEON STRL SZ8 (GLOVE) ×1 IMPLANT
GOWN STRL REUS W/ TWL LRG LVL3 (GOWN DISPOSABLE) ×2 IMPLANT
GOWN STRL REUS W/ TWL XL LVL3 (GOWN DISPOSABLE) ×1 IMPLANT
GOWN STRL REUS W/TWL LRG LVL3 (GOWN DISPOSABLE) ×2
GOWN STRL REUS W/TWL XL LVL3 (GOWN DISPOSABLE) ×1
KIT BASIN OR (CUSTOM PROCEDURE TRAY) ×1 IMPLANT
KIT TURNOVER KIT B (KITS) ×1 IMPLANT
NS IRRIG 1000ML POUR BTL (IV SOLUTION) ×1 IMPLANT
PACK CV ACCESS (CUSTOM PROCEDURE TRAY) ×1 IMPLANT
PAD ARMBOARD 7.5X6 YLW CONV (MISCELLANEOUS) ×2 IMPLANT
SLING ARM FOAM STRAP LRG (SOFTGOODS) IMPLANT
SLING ARM FOAM STRAP MED (SOFTGOODS) IMPLANT
STRIP CLOSURE SKIN 1/2X4 (GAUZE/BANDAGES/DRESSINGS) ×2 IMPLANT
SUT MNCRL AB 4-0 PS2 18 (SUTURE) ×1 IMPLANT
SUT PROLENE 6 0 BV (SUTURE) ×1 IMPLANT
SUT SILK 2 0 SH (SUTURE) IMPLANT
SUT VIC AB 3-0 SH 27 (SUTURE) ×3
SUT VIC AB 3-0 SH 27X BRD (SUTURE) ×1 IMPLANT
TOWEL GREEN STERILE (TOWEL DISPOSABLE) ×1 IMPLANT
UNDERPAD 30X36 HEAVY ABSORB (UNDERPADS AND DIAPERS) ×1 IMPLANT
WATER STERILE IRR 1000ML POUR (IV SOLUTION) ×1 IMPLANT

## 2022-12-06 NOTE — Transfer of Care (Signed)
Immediate Anesthesia Transfer of Care Note  Patient: Francisco Baldwin  Procedure(s) Performed: LEFT ARM SECOND STAGE BASILIC VEIN TRANSPOSITION (Left: Arm Upper)  Patient Location: PACU  Anesthesia Type:MAC  Level of Consciousness: sedated and patient cooperative  Airway & Oxygen Therapy: Patient Spontanous Breathing and Patient connected to nasal cannula oxygen  Post-op Assessment: Report given to RN and Post -op Vital signs reviewed and stable  Post vital signs: Reviewed and stable  Last Vitals:  Vitals Value Taken Time  BP 138/63 12/06/22 1249  Temp    Pulse 68 12/06/22 1251  Resp 19 12/06/22 1251  SpO2 98 % 12/06/22 1251  Vitals shown include unfiled device data.  Last Pain:  Vitals:   12/06/22 0913  TempSrc:   PainSc: 0-No pain         Complications: No notable events documented.

## 2022-12-06 NOTE — Discharge Instructions (Addendum)
   [facility]  Discharge Instructions  AV Fistula or Graft Surgery for Dialysis Access  Please refer to the following instructions for your post-procedure care. Your surgeon or physician assistant will discuss any changes with you.  Activity  You may drive the day following your surgery, if you are comfortable and no longer taking prescription pain medication. Resume full activity as the soreness in your incision resolves.  Bathing/Showering  You may shower after you go home. Keep your incision dry for 48 hours. Do not soak in a bathtub, hot tub, or swim until the incision heals completely. You may not shower if you have a hemodialysis catheter.  Incision Care  Clean your incision with mild soap and water after 48 hours. Pat the area dry with a clean towel. You do not need a bandage unless otherwise instructed. Do not apply any ointments or creams to your incision. You may have skin glue on your incision. Do not peel it off. It will come off on its own in about one week. Your arm may swell a bit after surgery. To reduce swelling use pillows to elevate your arm so it is above your heart. Your doctor will tell you if you need to lightly wrap your arm with an ACE bandage.  Diet  Resume your normal diet. There are not special food restrictions following this procedure. In order to heal from your surgery, it is CRITICAL to get adequate nutrition. Your body requires vitamins, minerals, and protein. Vegetables are the best source of vitamins and minerals. Vegetables also provide the perfect balance of protein. Processed food has little nutritional value, so try to avoid this.  Medications  Resume taking all of your medications. If your incision is causing pain, you may take over-the counter pain relievers such as acetaminophen (Tylenol). If you were prescribed a stronger pain medication, please be aware these medications can cause nausea and constipation. Prevent  nausea by taking the medication with a snack or meal. Avoid constipation by drinking plenty of fluids and eating foods with high amount of fiber, such as fruits, vegetables, and grains.  Do not take Tylenol if you are taking prescription pain medications.  RESTART ELIQUIS ON 12/08/2022  Follow up Your surgeon may want to see you in the office following your access surgery. If so, this will be arranged at the time of your surgery.  Please call us immediately for any of the following conditions:  Increased pain, redness, drainage (pus) from your incision site Fever of 101 degrees or higher Severe or worsening pain at your incision site Hand pain or numbness.  Reduce your risk of vascular disease:  Stop smoking. If you would like help, call [facility] at 1-800-QUIT-NOW (662-264-9890) or [place] at [phone]  Manage your cholesterol Maintain a desired weight Control your diabetes Keep your blood pressure down  Dialysis  It will take several weeks to several months for your new dialysis access to be ready for use. Your surgeon will determine when it is okay to use it. Your nephrologist will continue to direct your dialysis. You can continue to use your Permcath until your new access is ready for use.   12/06/2022 Francisco Baldwin [id number] 03-Mar-1956  Surgeon(s): Leonie Douglas, MD Daria Pastures, MD  Procedure(s): LEFT ARM SECOND STAGE BASILIC VEIN TRANSPOSITION  x Do not stick fistula for 12 weeks    If you have any questions, please call the office at [phone].

## 2022-12-06 NOTE — Anesthesia Postprocedure Evaluation (Signed)
Anesthesia Post Note  Patient: Francisco Baldwin  Procedure(s) Performed: LEFT ARM SECOND STAGE BASILIC VEIN TRANSPOSITION (Left: Arm Upper)     Patient location during evaluation: PACU Anesthesia Type: Regional Level of consciousness: awake and alert Pain management: pain level controlled Vital Signs Assessment: post-procedure vital signs reviewed and stable Respiratory status: spontaneous breathing, nonlabored ventilation, respiratory function stable and patient connected to nasal cannula oxygen Cardiovascular status: blood pressure returned to baseline and stable Postop Assessment: no apparent nausea or vomiting Anesthetic complications: no   No notable events documented.  Last Vitals:  Vitals:   12/06/22 1249 12/06/22 1300  BP: 138/63 (!) 174/61  Pulse: 68 68  Resp: 16 (!) 9  Temp: (!) 36 C   SpO2: 99% 100%    Last Pain:  Vitals:   12/06/22 1249  TempSrc:   PainSc: 0-No pain                 [place] Nation

## 2022-12-06 NOTE — Anesthesia Postprocedure Evaluation (Deleted)
Anesthesia Post Note  Patient: Francisco Baldwin  Procedure(s) Performed: LEFT ARM SECOND STAGE BASILIC VEIN TRANSPOSITION (Left: Arm Upper)     Patient location during evaluation: PACU Anesthesia Type: Regional Level of consciousness: awake and alert Pain management: pain level controlled Vital Signs Assessment: post-procedure vital signs reviewed and stable Respiratory status: spontaneous breathing, nonlabored ventilation, respiratory function stable and patient connected to nasal cannula oxygen Cardiovascular status: blood pressure returned to baseline and stable Postop Assessment: no apparent nausea or vomiting Anesthetic complications: no   No notable events documented.  Last Vitals:  Vitals:   12/06/22 1249 12/06/22 1300  BP: 138/63 (!) 174/61  Pulse: 68 68  Resp: 16 (!) 9  Temp: (!) 36 C   SpO2: 99% 100%    Last Pain:  Vitals:   12/06/22 1249  TempSrc:   PainSc: 0-No pain                 [place] Nation

## 2022-12-06 NOTE — Op Note (Signed)
DATE OF SERVICE: 12/06/2022  PATIENT:  Francisco Baldwin  67 y.o. male  PRE-OPERATIVE DIAGNOSIS:  ESRD  POST-OPERATIVE DIAGNOSIS:  Same  PROCEDURE:   Left second stage basilic vein fistula transposition  SURGEON:  Surgeons and Role:    * Leonie Douglas, MD - Primary    ASSISTANT:  Daria Pastures, MD - Assisting  An experienced assistant was required given the complexity of this procedure and the standard of surgical care. My assistant helped with exposure through counter tension, suctioning, ligation and retraction to better visualize the surgical field.  My assistant expedited sewing during the case by following my sutures. Wherever I use the term "we" in the report, my assistant actively helped me with that portion of the procedure.  ANESTHESIA:   regional and MAC  EBL: minimal  BLOOD ADMINISTERED:none  DRAINS: none   LOCAL MEDICATIONS USED:  NONE  SPECIMEN:  none  COUNTS: confirmed correct.  TOURNIQUET:  none  PATIENT DISPOSITION:  PACU - hemodynamically stable.   Delay start of Pharmacological VTE agent (>24hrs) due to surgical blood loss or risk of bleeding: no  INDICATION FOR PROCEDURE: Francisco Baldwin is a 67 y.o. male with ESRD. I previously created a first stage basilic vein fistula transposition for him 10/25/22. This has matured. After careful discussion of risks, benefits, and alternatives the patient was offered second stage transposition. The patient understood and wished to proceed.  OPERATIVE FINDINGS: healthy basilic vein fistula. The fistula had a good thrill after transposition. A weak radial artery pulse was palpable postoperatively.  DESCRIPTION OF PROCEDURE: After identification of the patient in the pre-operative holding area, the patient was transferred to the operating room. The patient was positioned supine on the operating room table. Anesthesia was induced. The left was prepped and draped in standard fashion. A surgical pause was performed  confirming correct patient, procedure, and operative location.  Using intraoperative ultrasound the course of the left basilic vein was marked on the skin.  Several skip incisions were made over the course of the basilic vein fistula.  These were carried down through subcutaneous tissue until the fistula was encountered.  The fascia was skeletonized from the axilla to the anastomosis, taking care to ligate and divide sidebranches, and to protect the medial antebrachial cutaneous nerve.   A subcutaneous tunnel was created over the biceps using a sheath tunneling device.  Patient was heparinized.  The fistula near the anastomosis was clamped.  The outflow in the axilla was clamped.  The fistula was divided.  The fistula was marked to ensure no twisting or kinking while delivering the fistula through the tunnel.  The fistula was tunneled through the arm and delivered near the previous anastomosis.  The fistula was spatulated proximally and distally.  The fistula was reanastomosed end-to-end using continuous running suture of 5-0 Prolene.  A palpable thrill was felt over the subcutaneous course of the tunnel.  Doppler flow was excellent in the proximal and distal fistula.  The wounds were copiously irrigated.  Hemostasis was ensured in the surgical bed.  The wounds were closed in layers using 3-0 Vicryl and 4-0 Monocryl.  Upon completion of the case instrument and sharps counts were confirmed correct. The patient was transferred to the PACU in good condition. I was present for all portions of the procedure.  FOLLOW UP PLAN: Assuming a normal postoperative course, a VVS PA will see the patient in 6 weeks with an AVF duplex.   Rande Brunt. Lenell Antu, MD FACS  [facility] Phone Number: [phone] 12/06/2022 12:38 PM

## 2022-12-06 NOTE — Anesthesia Procedure Notes (Addendum)
Anesthesia Regional Block: Axillary brachial plexus block   Pre-Anesthetic Checklist: , timeout performed,  Correct Patient, Correct Site, Correct Laterality,  Correct Procedure, Correct Position, site marked,  Risks and benefits discussed,  Surgical consent,  Pre-op evaluation,  At surgeon's request and post-op pain management  Laterality: Left  Prep: chloraprep       Needles:  Injection technique: Single-shot  Needle Type: Echogenic Stimulator Needle     Needle Length: 9cm  Needle Gauge: 21     Additional Needles:   Procedures:,,,, ultrasound used (permanent image in chart),,    Narrative:  Start time: 12/06/2022 9:50 AM End time: 12/06/2022 9:55 AM Injection made incrementally with aspirations every 5 mL.  Performed by: Personally  Anesthesiologist: [place]amo Nation, MD  Additional Notes: Discussed risks and benefits of the nerve block in detail, including but not limited vascular injury, permanent nerve damage and infection.   Patient tolerated the procedure well. Local anesthetic introduced in an incremental fashion under minimal resistance after negative aspirations. No paresthesias were elicited. After completion of the procedure, no acute issues were identified and patient continued to be monitored by RN.    30ml 0.5 Ropi used for axillary block  10ml 2% subcutaneous injection for intercostobrachial coverage.

## 2022-12-06 NOTE — Anesthesia Preprocedure Evaluation (Signed)
Anesthesia Evaluation  Patient identified by MRN, date of birth, ID band Patient awake    Reviewed: Allergy & Precautions, H&P , NPO status , Patient's Chart, lab work & pertinent test results  Airway Mallampati: II  TM Distance: >3 FB Neck ROM: Full    Dental no notable dental hx.    Pulmonary neg pulmonary ROS, former smoker   Pulmonary exam normal breath sounds clear to auscultation       Cardiovascular hypertension, Normal cardiovascular exam Rhythm:Regular Rate:Normal     Neuro/Psych  PSYCHIATRIC DISORDERS Anxiety Depression    CVA    GI/Hepatic ,GERD  ,,(+) Cirrhosis         Endo/Other  diabetesHypothyroidism    Renal/GU ESRF and DialysisRenal disease  negative genitourinary   Musculoskeletal negative musculoskeletal ROS (+)    Abdominal   Peds negative pediatric ROS (+)  Hematology  (+) Blood dyscrasia, anemia   Anesthesia Other Findings   Reproductive/Obstetrics negative OB ROS                             Anesthesia Physical Anesthesia Plan  ASA: 3  Anesthesia Plan: Regional   Post-op Pain Management:    Induction: Intravenous  PONV Risk Score and Plan: Ondansetron  Airway Management Planned: Natural Airway  Additional Equipment:   Intra-op Plan:   Post-operative Plan: Extubation in OR  Informed Consent: I have reviewed the patients History and Physical, chart, labs and discussed the procedure including the risks, benefits and alternatives for the proposed anesthesia with the patient or authorized representative who has indicated his/her understanding and acceptance.     Dental advisory given  Plan Discussed with: CRNA  Anesthesia Plan Comments:        Anesthesia Quick Evaluation

## 2022-12-06 NOTE — Interval H&P Note (Signed)
History and Physical Interval Note:  12/06/2022 10:25 AM  Francisco Baldwin  has presented today for surgery, with the diagnosis of End Stage Renal Disease.  The various methods of treatment have been discussed with the patient and family. After consideration of risks, benefits and other options for treatment, the patient has consented to  Procedure(s): LEFT ARM SECOND STAGE BASILIC VEIN TRANSPOSITION (Left) as a surgical intervention.  The patient's history has been reviewed, patient examined, no change in status, stable for surgery.  I have reviewed the patient's chart and labs.  Questions were answered to the patient's satisfaction.     Leonie Douglas

## 2022-12-06 NOTE — Addendum Note (Signed)
Addendum  created 12/06/22 1332 by [place] Nation, MD   Clinical Note Signed, Intraprocedure Blocks edited

## 2022-12-07 ENCOUNTER — Encounter (HOSPITAL_COMMUNITY): Payer: Self-pay | Admitting: Vascular Surgery

## 2022-12-07 DIAGNOSIS — Z992 Dependence on renal dialysis: Secondary | ICD-10-CM | POA: Diagnosis not present

## 2022-12-07 DIAGNOSIS — D509 Iron deficiency anemia, unspecified: Secondary | ICD-10-CM | POA: Diagnosis not present

## 2022-12-07 DIAGNOSIS — N186 End stage renal disease: Secondary | ICD-10-CM | POA: Diagnosis not present

## 2022-12-07 DIAGNOSIS — N2581 Secondary hyperparathyroidism of renal origin: Secondary | ICD-10-CM | POA: Diagnosis not present

## 2022-12-07 DIAGNOSIS — D631 Anemia in chronic kidney disease: Secondary | ICD-10-CM | POA: Diagnosis not present

## 2022-12-07 DIAGNOSIS — Z23 Encounter for immunization: Secondary | ICD-10-CM | POA: Diagnosis not present

## 2022-12-10 DIAGNOSIS — N186 End stage renal disease: Secondary | ICD-10-CM | POA: Diagnosis not present

## 2022-12-10 DIAGNOSIS — D631 Anemia in chronic kidney disease: Secondary | ICD-10-CM | POA: Diagnosis not present

## 2022-12-10 DIAGNOSIS — N2581 Secondary hyperparathyroidism of renal origin: Secondary | ICD-10-CM | POA: Diagnosis not present

## 2022-12-10 DIAGNOSIS — Z992 Dependence on renal dialysis: Secondary | ICD-10-CM | POA: Diagnosis not present

## 2022-12-10 DIAGNOSIS — Z23 Encounter for immunization: Secondary | ICD-10-CM | POA: Diagnosis not present

## 2022-12-10 DIAGNOSIS — D509 Iron deficiency anemia, unspecified: Secondary | ICD-10-CM | POA: Diagnosis not present

## 2022-12-11 ENCOUNTER — Other Ambulatory Visit: Payer: Self-pay

## 2022-12-11 DIAGNOSIS — Z992 Dependence on renal dialysis: Secondary | ICD-10-CM

## 2022-12-12 DIAGNOSIS — N2581 Secondary hyperparathyroidism of renal origin: Secondary | ICD-10-CM | POA: Diagnosis not present

## 2022-12-12 DIAGNOSIS — D509 Iron deficiency anemia, unspecified: Secondary | ICD-10-CM | POA: Diagnosis not present

## 2022-12-12 DIAGNOSIS — D631 Anemia in chronic kidney disease: Secondary | ICD-10-CM | POA: Diagnosis not present

## 2022-12-12 DIAGNOSIS — Z992 Dependence on renal dialysis: Secondary | ICD-10-CM | POA: Diagnosis not present

## 2022-12-12 DIAGNOSIS — Z23 Encounter for immunization: Secondary | ICD-10-CM | POA: Diagnosis not present

## 2022-12-12 DIAGNOSIS — N186 End stage renal disease: Secondary | ICD-10-CM | POA: Diagnosis not present

## 2022-12-13 DIAGNOSIS — E1122 Type 2 diabetes mellitus with diabetic chronic kidney disease: Secondary | ICD-10-CM | POA: Diagnosis not present

## 2022-12-13 DIAGNOSIS — I509 Heart failure, unspecified: Secondary | ICD-10-CM | POA: Diagnosis not present

## 2022-12-13 DIAGNOSIS — I69398 Other sequelae of cerebral infarction: Secondary | ICD-10-CM | POA: Diagnosis not present

## 2022-12-13 DIAGNOSIS — N186 End stage renal disease: Secondary | ICD-10-CM | POA: Diagnosis not present

## 2022-12-13 DIAGNOSIS — I132 Hypertensive heart and chronic kidney disease with heart failure and with stage 5 chronic kidney disease, or end stage renal disease: Secondary | ICD-10-CM | POA: Diagnosis not present

## 2022-12-13 DIAGNOSIS — R531 Weakness: Secondary | ICD-10-CM | POA: Diagnosis not present

## 2022-12-14 DIAGNOSIS — D631 Anemia in chronic kidney disease: Secondary | ICD-10-CM | POA: Diagnosis not present

## 2022-12-14 DIAGNOSIS — D509 Iron deficiency anemia, unspecified: Secondary | ICD-10-CM | POA: Diagnosis not present

## 2022-12-14 DIAGNOSIS — N2581 Secondary hyperparathyroidism of renal origin: Secondary | ICD-10-CM | POA: Diagnosis not present

## 2022-12-14 DIAGNOSIS — Z992 Dependence on renal dialysis: Secondary | ICD-10-CM | POA: Diagnosis not present

## 2022-12-14 DIAGNOSIS — N186 End stage renal disease: Secondary | ICD-10-CM | POA: Diagnosis not present

## 2022-12-14 DIAGNOSIS — Z23 Encounter for immunization: Secondary | ICD-10-CM | POA: Diagnosis not present

## 2022-12-17 DIAGNOSIS — Z992 Dependence on renal dialysis: Secondary | ICD-10-CM | POA: Diagnosis not present

## 2022-12-17 DIAGNOSIS — D631 Anemia in chronic kidney disease: Secondary | ICD-10-CM | POA: Diagnosis not present

## 2022-12-17 DIAGNOSIS — N186 End stage renal disease: Secondary | ICD-10-CM | POA: Diagnosis not present

## 2022-12-17 DIAGNOSIS — N2581 Secondary hyperparathyroidism of renal origin: Secondary | ICD-10-CM | POA: Diagnosis not present

## 2022-12-17 DIAGNOSIS — Z23 Encounter for immunization: Secondary | ICD-10-CM | POA: Diagnosis not present

## 2022-12-17 DIAGNOSIS — D509 Iron deficiency anemia, unspecified: Secondary | ICD-10-CM | POA: Diagnosis not present

## 2022-12-19 ENCOUNTER — Ambulatory Visit: Payer: Medicare Other | Admitting: Cardiology

## 2022-12-19 DIAGNOSIS — Z932 Ileostomy status: Secondary | ICD-10-CM | POA: Diagnosis not present

## 2022-12-19 DIAGNOSIS — D631 Anemia in chronic kidney disease: Secondary | ICD-10-CM | POA: Diagnosis not present

## 2022-12-19 DIAGNOSIS — Z992 Dependence on renal dialysis: Secondary | ICD-10-CM | POA: Diagnosis not present

## 2022-12-19 DIAGNOSIS — N2581 Secondary hyperparathyroidism of renal origin: Secondary | ICD-10-CM | POA: Diagnosis not present

## 2022-12-19 DIAGNOSIS — Z23 Encounter for immunization: Secondary | ICD-10-CM | POA: Diagnosis not present

## 2022-12-19 DIAGNOSIS — D509 Iron deficiency anemia, unspecified: Secondary | ICD-10-CM | POA: Diagnosis not present

## 2022-12-19 DIAGNOSIS — N186 End stage renal disease: Secondary | ICD-10-CM | POA: Diagnosis not present

## 2022-12-20 ENCOUNTER — Telehealth: Payer: Self-pay

## 2022-12-20 DIAGNOSIS — N186 End stage renal disease: Secondary | ICD-10-CM | POA: Diagnosis not present

## 2022-12-20 DIAGNOSIS — R531 Weakness: Secondary | ICD-10-CM | POA: Diagnosis not present

## 2022-12-20 DIAGNOSIS — I69398 Other sequelae of cerebral infarction: Secondary | ICD-10-CM | POA: Diagnosis not present

## 2022-12-20 DIAGNOSIS — E1122 Type 2 diabetes mellitus with diabetic chronic kidney disease: Secondary | ICD-10-CM | POA: Diagnosis not present

## 2022-12-20 DIAGNOSIS — I509 Heart failure, unspecified: Secondary | ICD-10-CM | POA: Diagnosis not present

## 2022-12-20 DIAGNOSIS — I132 Hypertensive heart and chronic kidney disease with heart failure and with stage 5 chronic kidney disease, or end stage renal disease: Secondary | ICD-10-CM | POA: Diagnosis not present

## 2022-12-20 NOTE — Telephone Encounter (Signed)
    [facility] Pre-operative Risk Assessment    Request for surgical clearance:  What type of surgery is being performed? Loop Ileastomy Closure and G tube removal   When is this surgery scheduled? 01/10/2023   What type of clearance is required (medical clearance vs. Pharmacy clearance to hold med vs. Both)? Both  Are there any medications that need to be held prior to surgery and how long?Eliquis , holding length not specified   Practice name and name of physician performing surgery? Dr.Matthew Chauviere at [facility]   What is your office phone number: [phone]    7.   What is your office fax number: [phone]  8.   Anesthesia type (None, local, MAC, general) ? Not specified   Francisco Baldwin Traeh Milroy 12/20/2022, 4:40 PM  _________________________________________________________________   (provider comments below)

## 2022-12-21 DIAGNOSIS — N186 End stage renal disease: Secondary | ICD-10-CM | POA: Diagnosis not present

## 2022-12-21 DIAGNOSIS — D631 Anemia in chronic kidney disease: Secondary | ICD-10-CM | POA: Diagnosis not present

## 2022-12-21 DIAGNOSIS — N2581 Secondary hyperparathyroidism of renal origin: Secondary | ICD-10-CM | POA: Diagnosis not present

## 2022-12-21 DIAGNOSIS — D509 Iron deficiency anemia, unspecified: Secondary | ICD-10-CM | POA: Diagnosis not present

## 2022-12-21 DIAGNOSIS — Z992 Dependence on renal dialysis: Secondary | ICD-10-CM | POA: Diagnosis not present

## 2022-12-21 DIAGNOSIS — Z23 Encounter for immunization: Secondary | ICD-10-CM | POA: Diagnosis not present

## 2022-12-21 NOTE — Telephone Encounter (Signed)
Caller Catering manager) returned staff call and reported patient will need to be off Eliquis two days prior to surgery.

## 2022-12-21 NOTE — Telephone Encounter (Addendum)
Dr. Bing Matter You saw this patient in July and he reported chest tightness and poor functional capacity. We are now asked for clearance for Loop Ileastomy Closure and G tube removal.   Can you give recommendations for clearance for surgery or do you recommend another in office visit. His initial visit was for BP fluctuations on HD days.

## 2022-12-21 NOTE — Telephone Encounter (Deleted)
 **  This guidance is not considered finalized until pre-operative APP has relayed final recommendations.**

## 2022-12-23 NOTE — Telephone Encounter (Signed)
Name: Francisco Baldwin  DOB: [birth year]  MRN: [id number]  Primary Cardiologist: Gypsy Balsam, MD  Chart reviewed as part of pre-operative protocol coverage. Because of JOAHAN MATHIESEN past medical history and time since last visit, he will require a follow-up in-office visit in order to better assess preoperative cardiovascular risk.    Per Dr. Bing Matter: He needs to have  another office visit to fully assess     Pre-op covering staff: - Please schedule appointment and call patient to inform them. If patient already had an upcoming appointment within acceptable timeframe, please add "pre-op clearance" to the appointment notes so provider is aware. - Please contact requesting surgeon's office via preferred method (i.e, phone, fax) to inform them of need for appointment prior to surgery.    Marcelino Duster, PA  12/23/2022, 8:19 PM

## 2022-12-24 DIAGNOSIS — N2581 Secondary hyperparathyroidism of renal origin: Secondary | ICD-10-CM | POA: Diagnosis not present

## 2022-12-24 DIAGNOSIS — D509 Iron deficiency anemia, unspecified: Secondary | ICD-10-CM | POA: Diagnosis not present

## 2022-12-24 DIAGNOSIS — Z23 Encounter for immunization: Secondary | ICD-10-CM | POA: Diagnosis not present

## 2022-12-24 DIAGNOSIS — Z992 Dependence on renal dialysis: Secondary | ICD-10-CM | POA: Diagnosis not present

## 2022-12-24 DIAGNOSIS — N186 End stage renal disease: Secondary | ICD-10-CM | POA: Diagnosis not present

## 2022-12-24 DIAGNOSIS — D631 Anemia in chronic kidney disease: Secondary | ICD-10-CM | POA: Diagnosis not present

## 2022-12-24 NOTE — Telephone Encounter (Signed)
I will update all parties involved pt has appt 12/28/22 with Wallis Bamberg, NP for pre op clearance.

## 2022-12-24 NOTE — Telephone Encounter (Signed)
I will fax notes to requesting office the pt will need an IN OFFICE appt for pre op clearance per Dr. Bing Matter.   I will send message to Dr. Bing Matter scheduling team to see where we can get the pt in.   Procedure may need to be post poned depending on when we get the pt in for pre op clearance.

## 2022-12-26 DIAGNOSIS — N186 End stage renal disease: Secondary | ICD-10-CM | POA: Diagnosis not present

## 2022-12-26 DIAGNOSIS — Z23 Encounter for immunization: Secondary | ICD-10-CM | POA: Diagnosis not present

## 2022-12-26 DIAGNOSIS — Z992 Dependence on renal dialysis: Secondary | ICD-10-CM | POA: Diagnosis not present

## 2022-12-26 DIAGNOSIS — N2581 Secondary hyperparathyroidism of renal origin: Secondary | ICD-10-CM | POA: Diagnosis not present

## 2022-12-26 DIAGNOSIS — D509 Iron deficiency anemia, unspecified: Secondary | ICD-10-CM | POA: Diagnosis not present

## 2022-12-26 DIAGNOSIS — D631 Anemia in chronic kidney disease: Secondary | ICD-10-CM | POA: Diagnosis not present

## 2022-12-27 NOTE — Progress Notes (Addendum)
 Cardiology Office Note:  .   Date:  12/28/2022  ID:  [name], DOB 1956/01/16, MRN [id number] PCP: Lezlie Lye, Meda Coffee, MD  [facility] Providers Cardiologist:  Gypsy Balsam, MD    History of Present Illness: .   Francisco Baldwin is a 67 y.o. male with a past medical history of hypertension, stroke, hepatic cirrhosis, DM 2, ESRD on HD, dyslipidemia, history of DVT on Eliquis.  He establish care with Dr. Bing Matter on 09/25/2022 for evaluation of chest pain. An echocardiogram was arranged and completed on 10/23/2022 revealing an EF of 60 to 65%, mild LVH, grade 1 DD, no valvular abnormalities.  He presents today accompanied by his wife and son for follow-up of his hypertension and chest pain.  He does not really recall having any episodes of chest pain however it is noted in Dr. Vanetta Shawl note.  He has started on hemodialysis Monday Wednesday Friday, tolerating this well.  His blood pressure is elevated in the office today however he will have HD later today, he also takes his antihypertensive medications on his off days so it is understandably higher.  He is here for preoperative evaluation, has upcoming colostomy takedown.  He occasionally has pedal edema however none is appreciated today.  He denies chest pain--but apparently had episodes of this in the past, palpitations, dyspnea, pnd, orthopnea, n, v, dizziness, syncope, edema, weight gain, or early satiety.    ROS: Review of Systems  Cardiovascular:  Positive for chest pain and leg swelling.  All other systems reviewed and are negative.    Studies Reviewed: Marland Kitchen   EKG Interpretation Date/Time:  Friday December 28 2022 10:11:43 EDT Ventricular Rate:  69 PR Interval:  150 QRS Duration:  130 QT Interval:  446 QTC Calculation: 477 R Axis:   -78  Text Interpretation: Normal sinus rhythm Left axis deviation Right bundle branch block Abnormal ECG When compared with ECG of 01-Aug-2022 12:57, PREVIOUS ECG IS PRESENT Confirmed  by Wallis Bamberg [phone]) on 12/28/2022 12:02:04 PM    Cardiac Studies & Procedures       ECHOCARDIOGRAM  ECHOCARDIOGRAM COMPLETE 10/23/2022  Narrative ECHOCARDIOGRAM REPORT    Patient Name:   Francisco Baldwin Date of Exam: 10/23/2022 Medical Rec #:  [id number]        Height:       65.0 in Accession #:    [id number]       Weight:       163.0 lb Date of Birth:  08/01/1955        BSA:          1.813 m Patient Age:    67 years         BP:           147/71 mmHg Patient Gender: M                HR:           68 bpm. Exam Location:  [place]  Procedure: 2D Echo, Cardiac Doppler, Color Doppler and Strain Analysis  Indications:    Chest pain in adult [R07.9 (ICD-10-CM)]  History:        Patient has no prior history of Echocardiogram examinations. Signs/Symptoms:Hypotension, Chest Pain and Fatigue; Risk Factors:Former Smoker, Hypertension and Dyslipidemia. Hemodialysis-associated hypotension. Hepatic cirrhosis, unspecified hepatic cirrhosis type, unspecified whether ascites present (HCC) End stage renal disease (HCC).  Sonographer:    Margreta Journey RDCS Referring Phys: [id number] Georgeanna Lea   Sonographer Comments: Restricted mobility. IMPRESSIONS  1. Left ventricular ejection fraction, by estimation, is 60 to 65%. The left ventricle has normal function. The left ventricle has no regional wall motion abnormalities. There is mild left ventricular hypertrophy. Left ventricular diastolic parameters are consistent with Grade I diastolic dysfunction (impaired relaxation). The average left ventricular global longitudinal strain is -15.3 %. The global longitudinal strain is abnormal. 2. Right ventricular systolic function is normal. The right ventricular size is normal. 3. The mitral valve is normal in structure. No evidence of mitral valve regurgitation. No evidence of mitral stenosis. 4. The aortic valve is normal in structure. Aortic valve regurgitation is not visualized. No  aortic stenosis is present. 5. The inferior vena cava is normal in size with greater than 50% respiratory variability, suggesting right atrial pressure of 3 mmHg.  FINDINGS Left Ventricle: Left ventricular ejection fraction, by estimation, is 60 to 65%. The left ventricle has normal function. The left ventricle has no regional wall motion abnormalities. The average left ventricular global longitudinal strain is -15.3 %. The global longitudinal strain is abnormal. The left ventricular internal cavity size was normal in size. There is mild left ventricular hypertrophy. Left ventricular diastolic parameters are consistent with Grade I diastolic dysfunction (impaired relaxation).  Right Ventricle: The right ventricular size is normal. No increase in right ventricular wall thickness. Right ventricular systolic function is normal.  Left Atrium: Left atrial size was normal in size.  Right Atrium: Right atrial size was normal in size.  Pericardium: There is no evidence of pericardial effusion.  Mitral Valve: The mitral valve is normal in structure. No evidence of mitral valve regurgitation. No evidence of mitral valve stenosis.  Tricuspid Valve: The tricuspid valve is normal in structure. Tricuspid valve regurgitation is not demonstrated. No evidence of tricuspid stenosis.  Aortic Valve: The aortic valve is normal in structure. Aortic valve regurgitation is not visualized. No aortic stenosis is present.  Pulmonic Valve: The pulmonic valve was normal in structure. Pulmonic valve regurgitation is not visualized. No evidence of pulmonic stenosis.  Aorta: The aortic root is normal in size and structure.  Venous: The inferior vena cava is normal in size with greater than 50% respiratory variability, suggesting right atrial pressure of 3 mmHg.  IAS/Shunts: No atrial level shunt detected by color flow Doppler.   LEFT VENTRICLE PLAX 2D LVIDd:         4.80 cm   Diastology LVIDs:         3.10 cm   LV  e' medial:    4.95 cm/s LV PW:         1.30 cm   LV E/e' medial:  18.9 LV IVS:        1.30 cm   LV e' lateral:   4.89 cm/s LVOT diam:     2.00 cm   LV E/e' lateral: 19.2 LV SV:         68 LV SV Index:   37        2D Longitudinal Strain LVOT Area:     3.14 cm  2D Strain GLS Avg:     -15.3 %   RIGHT VENTRICLE            IVC RV Basal diam:  3.30 cm    IVC diam: 1.40 cm RV Mid diam:    2.50 cm RV S prime:     8.00 cm/s TAPSE (M-mode): 2.0 cm  LEFT ATRIUM             Index  RIGHT ATRIUM           Index LA diam:        4.40 cm 2.43 cm/m   RA Area:     12.20 cm LA Vol (A2C):   36.3 ml 20.02 ml/m  RA Volume:   27.90 ml  15.39 ml/m LA Vol (A4C):   45.0 ml 24.82 ml/m LA Biplane Vol: 40.6 ml 22.39 ml/m AORTIC VALVE LVOT Vmax:   96.33 cm/s LVOT Vmean:  64.567 cm/s LVOT VTI:    0.216 m  AORTA Ao Root diam: 3.40 cm Ao Desc diam: 1.50 cm  MITRAL VALVE MV Area (PHT): 2.89 cm     SHUNTS MV Decel Time: 263 msec     Systemic VTI:  0.22 m MV E velocity: 93.75 cm/s   Systemic Diam: 2.00 cm MV A velocity: 139.50 cm/s MV E/A ratio:  0.67  Gypsy Balsam MD Electronically signed by Gypsy Balsam MD Signature Date/Time: 10/23/2022/12:44:45 PM    Final             Risk Assessment/Calculations:          Physical Exam:   VS:  BP (!) 162/80 (BP Location: Left Arm, Patient Position: Sitting, Cuff Size: Normal)   Pulse 69   Ht 5\' 6"  (1.676 m)   Wt 157 lb 6.4 oz (71.4 kg)   SpO2 100%   BMI 25.41 kg/m    Wt Readings from Last 3 Encounters:  12/28/22 157 lb 6.4 oz (71.4 kg)  12/06/22 158 lb 11.7 oz (72 kg)  11/27/22 160 lb (72.6 kg)    GEN: Chronically ill-appearing but in no acute distress NECK: No JVD; No carotid bruits CARDIAC: RRR, no murmurs, rubs, gallops RESPIRATORY:  Clear to auscultation without rales, wheezing or rhonchi  ABDOMEN: Soft, non-tender, non-distended EXTREMITIES:  No edema; No deformity   ASSESSMENT AND PLAN: .   Chest pain -Per reviewing  discharge.  He establish care with [place] for evaluation of chest pain however today he does not recall having any episodes of chest pain.  He does however have comorbid conditions and is going undergo a rather rigorous surgery so an ischemic evaluation would be prudent.  Discussed this with the family and they are agreeable to proceed. ESRD-on HD Monday Wednesday Friday, tolerating this well, this was recently started in July. Hypertension-blood pressure is elevated in the office today however he has HD later on today, he also takes his antihypertensive medications on his off days so it is understandably higher today. DVTs/hypercoagulable state-currently anticoagulated with Eliquis, we do not manage this for him so we cannot comment on how long he should hold this.  Apparently was started when he was hospitalized.  Will send our note to his PCP for further evaluation and recommendations regarding holding his Eliquis prior to his surgery. Preoperative evaluation  -he needs to undergo an ischemic evaluation as he initially presented to [place] with concerns of chest pain, he is also in a deconditioned state.  If his ischemic evaluation is normal, he can proceed with the surgery at an acceptable risk.  Once we receive the results of his ischemic evaluation we will forward recommendation to a surgeons office.  Informed Consent   Shared Decision Making/Informed Consent The risks [chest pain, shortness of breath, cardiac arrhythmias, dizziness, blood pressure fluctuations, myocardial infarction, stroke/transient ischemic attack, nausea, vomiting, allergic reaction, radiation exposure, metallic taste sensation and life-threatening complications (estimated to be 1 in 10,000)], benefits (risk stratification, diagnosing coronary artery disease, treatment guidance)  and alternatives of a nuclear stress test were discussed in detail with Mr. Blankley and he agrees to proceed.  **addendum 01/02/23, stress evaluation was a  normal, low risk study and he may proceed at an acceptable risk with surgery**     Dispo: Lexiscan.  Follow-up with Dr. Bing Matter in 3 months.  Signed, Flossie Dibble, NP

## 2022-12-28 ENCOUNTER — Ambulatory Visit: Payer: Medicare Other | Attending: Cardiology | Admitting: Cardiology

## 2022-12-28 ENCOUNTER — Encounter: Payer: Self-pay | Admitting: Cardiology

## 2022-12-28 VITALS — BP 162/80 | HR 69 | Ht 66.0 in | Wt 157.4 lb

## 2022-12-28 DIAGNOSIS — N186 End stage renal disease: Secondary | ICD-10-CM | POA: Diagnosis present

## 2022-12-28 DIAGNOSIS — I10 Essential (primary) hypertension: Secondary | ICD-10-CM

## 2022-12-28 DIAGNOSIS — D6859 Other primary thrombophilia: Secondary | ICD-10-CM | POA: Diagnosis present

## 2022-12-28 DIAGNOSIS — I824Y9 Acute embolism and thrombosis of unspecified deep veins of unspecified proximal lower extremity: Secondary | ICD-10-CM | POA: Diagnosis present

## 2022-12-28 DIAGNOSIS — Z23 Encounter for immunization: Secondary | ICD-10-CM | POA: Diagnosis not present

## 2022-12-28 DIAGNOSIS — Z01810 Encounter for preprocedural cardiovascular examination: Secondary | ICD-10-CM | POA: Diagnosis present

## 2022-12-28 DIAGNOSIS — N2581 Secondary hyperparathyroidism of renal origin: Secondary | ICD-10-CM | POA: Diagnosis not present

## 2022-12-28 DIAGNOSIS — D631 Anemia in chronic kidney disease: Secondary | ICD-10-CM | POA: Diagnosis not present

## 2022-12-28 DIAGNOSIS — R079 Chest pain, unspecified: Secondary | ICD-10-CM | POA: Diagnosis present

## 2022-12-28 DIAGNOSIS — D509 Iron deficiency anemia, unspecified: Secondary | ICD-10-CM | POA: Diagnosis not present

## 2022-12-28 DIAGNOSIS — Z992 Dependence on renal dialysis: Secondary | ICD-10-CM | POA: Diagnosis not present

## 2022-12-28 NOTE — Patient Instructions (Signed)
Medication Instructions:  Your physician recommends that you continue on your current medications as directed. Please refer to the Current Medication list given to you today.  *If you need a refill on your cardiac medications before your next appointment, please call your pharmacy*   Lab Work: NONE If you have labs (blood work) drawn today and your tests are completely normal, you will receive your results only by: MyChart Message (if you have MyChart) OR A paper copy in the mail If you have any lab test that is abnormal or we need to change your treatment, we will call you to review the results.   Testing/Procedures: Your physician has requested that you have a lexiscan myoview. For further information please visit [email or web address] Please follow instruction sheet, as given.   The test will take approximately 3 to 4 hours to complete; you may bring reading material.  If someone comes with you to your appointment, they will need to remain in the main lobby due to limited space in the testing area.How to prepare for your Myocardial Perfusion Test:             Do not take Erectile Dysfunction medication 48 hours prior to test. Do not eat or drink 3 hours prior to your test, except you may have water. Do not consume products containing caffeine (regular or decaffeinated) 12 hours prior to your test. (ex: coffee, chocolate, sodas, tea). Do bring a list of your current medications with you.  If not listed below, you may take your medications as normal. Do wear comfortable clothes (no dresses or overalls) and walking shoes, tennis shoes preferred (No heels or open toe shoes are allowed). Do NOT wear cologne, perfume, aftershave, or lotions (deodorant is allowed). If these instructions are not followed, your test will have to be rescheduled.    Follow-Up: At [facility], you and your health needs are our priority.  As part of our continuing mission to provide you with exceptional  heart care, we have created designated Provider Care Teams.  These Care Teams include your primary Cardiologist (physician) and Advanced Practice Providers (APPs -  Physician Assistants and Nurse Practitioners) who all work together to provide you with the care you need, when you need it.  We recommend signing up for the patient portal called "MyChart".  Sign up information is provided on this After Visit Summary.  MyChart is used to connect with patients for Virtual Visits (Telemedicine).  Patients are able to view lab/test results, encounter notes, upcoming appointments, etc.  Non-urgent messages can be sent to your provider as well.   To learn more about what you can do with MyChart, go to [email or web address].    Your next appointment:   3 month(s)  Provider:   Gypsy Balsam, MD    Other Instructions

## 2022-12-31 DIAGNOSIS — I132 Hypertensive heart and chronic kidney disease with heart failure and with stage 5 chronic kidney disease, or end stage renal disease: Secondary | ICD-10-CM | POA: Diagnosis not present

## 2022-12-31 DIAGNOSIS — N2581 Secondary hyperparathyroidism of renal origin: Secondary | ICD-10-CM | POA: Diagnosis not present

## 2022-12-31 DIAGNOSIS — N186 End stage renal disease: Secondary | ICD-10-CM | POA: Diagnosis not present

## 2022-12-31 DIAGNOSIS — I4510 Unspecified right bundle-branch block: Secondary | ICD-10-CM | POA: Diagnosis not present

## 2022-12-31 DIAGNOSIS — M21371 Foot drop, right foot: Secondary | ICD-10-CM | POA: Diagnosis not present

## 2022-12-31 DIAGNOSIS — E1142 Type 2 diabetes mellitus with diabetic polyneuropathy: Secondary | ICD-10-CM | POA: Diagnosis not present

## 2022-12-31 DIAGNOSIS — E861 Hypovolemia: Secondary | ICD-10-CM | POA: Diagnosis not present

## 2022-12-31 DIAGNOSIS — I959 Hypotension, unspecified: Secondary | ICD-10-CM | POA: Diagnosis not present

## 2022-12-31 DIAGNOSIS — I509 Heart failure, unspecified: Secondary | ICD-10-CM | POA: Diagnosis not present

## 2022-12-31 DIAGNOSIS — F419 Anxiety disorder, unspecified: Secondary | ICD-10-CM | POA: Diagnosis not present

## 2022-12-31 DIAGNOSIS — Z992 Dependence on renal dialysis: Secondary | ICD-10-CM | POA: Diagnosis not present

## 2022-12-31 DIAGNOSIS — E878 Other disorders of electrolyte and fluid balance, not elsewhere classified: Secondary | ICD-10-CM | POA: Diagnosis not present

## 2022-12-31 DIAGNOSIS — D509 Iron deficiency anemia, unspecified: Secondary | ICD-10-CM | POA: Diagnosis not present

## 2022-12-31 DIAGNOSIS — F32A Depression, unspecified: Secondary | ICD-10-CM | POA: Diagnosis not present

## 2022-12-31 DIAGNOSIS — E039 Hypothyroidism, unspecified: Secondary | ICD-10-CM | POA: Diagnosis not present

## 2022-12-31 DIAGNOSIS — I69398 Other sequelae of cerebral infarction: Secondary | ICD-10-CM | POA: Diagnosis not present

## 2022-12-31 DIAGNOSIS — E785 Hyperlipidemia, unspecified: Secondary | ICD-10-CM | POA: Diagnosis not present

## 2022-12-31 DIAGNOSIS — D631 Anemia in chronic kidney disease: Secondary | ICD-10-CM | POA: Diagnosis not present

## 2022-12-31 DIAGNOSIS — K9400 Colostomy complication, unspecified: Secondary | ICD-10-CM | POA: Diagnosis not present

## 2022-12-31 DIAGNOSIS — M5416 Radiculopathy, lumbar region: Secondary | ICD-10-CM | POA: Diagnosis not present

## 2022-12-31 DIAGNOSIS — K219 Gastro-esophageal reflux disease without esophagitis: Secondary | ICD-10-CM | POA: Diagnosis not present

## 2022-12-31 DIAGNOSIS — Z794 Long term (current) use of insulin: Secondary | ICD-10-CM | POA: Diagnosis not present

## 2022-12-31 DIAGNOSIS — R531 Weakness: Secondary | ICD-10-CM | POA: Diagnosis not present

## 2022-12-31 DIAGNOSIS — E860 Dehydration: Secondary | ICD-10-CM | POA: Diagnosis not present

## 2022-12-31 DIAGNOSIS — Z436 Encounter for attention to other artificial openings of urinary tract: Secondary | ICD-10-CM | POA: Diagnosis not present

## 2022-12-31 DIAGNOSIS — Z23 Encounter for immunization: Secondary | ICD-10-CM | POA: Diagnosis not present

## 2022-12-31 DIAGNOSIS — E1122 Type 2 diabetes mellitus with diabetic chronic kidney disease: Secondary | ICD-10-CM | POA: Diagnosis not present

## 2022-12-31 DIAGNOSIS — M21372 Foot drop, left foot: Secondary | ICD-10-CM | POA: Diagnosis not present

## 2023-01-01 ENCOUNTER — Ambulatory Visit: Payer: Medicare Other | Attending: Cardiology

## 2023-01-01 DIAGNOSIS — R079 Chest pain, unspecified: Secondary | ICD-10-CM | POA: Insufficient documentation

## 2023-01-01 DIAGNOSIS — Z01810 Encounter for preprocedural cardiovascular examination: Secondary | ICD-10-CM | POA: Diagnosis present

## 2023-01-01 MED ORDER — TECHNETIUM TC 99M TETROFOSMIN IV KIT
10.8000 | PACK | Freq: Once | INTRAVENOUS | Status: AC | PRN
  Administered 2023-01-01: 10.8 via INTRAVENOUS

## 2023-01-01 MED ORDER — REGADENOSON 0.4 MG/5ML IV SOLN
0.4000 mg | Freq: Once | INTRAVENOUS | Status: AC
Start: 2023-01-01 — End: 2023-01-01
  Administered 2023-01-01: 0.4 mg via INTRAVENOUS

## 2023-01-01 MED ORDER — TECHNETIUM TC 99M TETROFOSMIN IV KIT
28.4000 | PACK | Freq: Once | INTRAVENOUS | Status: AC | PRN
  Administered 2023-01-01: 28.4 via INTRAVENOUS

## 2023-01-01 MED ORDER — AMINOPHYLLINE 25 MG/ML IV SOLN
75.0000 mg | Freq: Once | INTRAVENOUS | Status: AC
  Administered 2023-01-01: 75 mg via INTRAVENOUS

## 2023-01-02 DIAGNOSIS — Z23 Encounter for immunization: Secondary | ICD-10-CM | POA: Diagnosis not present

## 2023-01-02 DIAGNOSIS — D631 Anemia in chronic kidney disease: Secondary | ICD-10-CM | POA: Diagnosis not present

## 2023-01-02 DIAGNOSIS — Z992 Dependence on renal dialysis: Secondary | ICD-10-CM | POA: Diagnosis not present

## 2023-01-02 DIAGNOSIS — D509 Iron deficiency anemia, unspecified: Secondary | ICD-10-CM | POA: Diagnosis not present

## 2023-01-02 DIAGNOSIS — N186 End stage renal disease: Secondary | ICD-10-CM | POA: Diagnosis not present

## 2023-01-02 DIAGNOSIS — N2581 Secondary hyperparathyroidism of renal origin: Secondary | ICD-10-CM | POA: Diagnosis not present

## 2023-01-02 LAB — MYOCARDIAL PERFUSION IMAGING
LV dias vol: 109 mL (ref 62–150)
LV sys vol: 48 mL
Nuc Stress EF: 56 %
Peak HR: 96 {beats}/min
Rest HR: 59 {beats}/min
Rest Nuclear Isotope Dose: 10.8 mCi
SDS: 1
SRS: 0
SSS: 1
Stress Nuclear Isotope Dose: 28.4 mCi
TID: 1.11

## 2023-01-03 DIAGNOSIS — K5730 Diverticulosis of large intestine without perforation or abscess without bleeding: Secondary | ICD-10-CM | POA: Diagnosis not present

## 2023-01-03 DIAGNOSIS — K5780 Diverticulitis of intestine, part unspecified, with perforation and abscess without bleeding: Secondary | ICD-10-CM | POA: Diagnosis not present

## 2023-01-03 DIAGNOSIS — R531 Weakness: Secondary | ICD-10-CM | POA: Diagnosis not present

## 2023-01-03 DIAGNOSIS — Z932 Ileostomy status: Secondary | ICD-10-CM | POA: Diagnosis not present

## 2023-01-03 DIAGNOSIS — I132 Hypertensive heart and chronic kidney disease with heart failure and with stage 5 chronic kidney disease, or end stage renal disease: Secondary | ICD-10-CM | POA: Diagnosis not present

## 2023-01-03 DIAGNOSIS — I509 Heart failure, unspecified: Secondary | ICD-10-CM | POA: Diagnosis not present

## 2023-01-03 DIAGNOSIS — E1122 Type 2 diabetes mellitus with diabetic chronic kidney disease: Secondary | ICD-10-CM | POA: Diagnosis not present

## 2023-01-03 DIAGNOSIS — I69398 Other sequelae of cerebral infarction: Secondary | ICD-10-CM | POA: Diagnosis not present

## 2023-01-03 DIAGNOSIS — N186 End stage renal disease: Secondary | ICD-10-CM | POA: Diagnosis not present

## 2023-01-04 DIAGNOSIS — N186 End stage renal disease: Secondary | ICD-10-CM | POA: Diagnosis not present

## 2023-01-04 DIAGNOSIS — D631 Anemia in chronic kidney disease: Secondary | ICD-10-CM | POA: Diagnosis not present

## 2023-01-04 DIAGNOSIS — Z992 Dependence on renal dialysis: Secondary | ICD-10-CM | POA: Diagnosis not present

## 2023-01-04 DIAGNOSIS — Z23 Encounter for immunization: Secondary | ICD-10-CM | POA: Diagnosis not present

## 2023-01-04 DIAGNOSIS — D509 Iron deficiency anemia, unspecified: Secondary | ICD-10-CM | POA: Diagnosis not present

## 2023-01-04 DIAGNOSIS — N2581 Secondary hyperparathyroidism of renal origin: Secondary | ICD-10-CM | POA: Diagnosis not present

## 2023-01-07 DIAGNOSIS — Z23 Encounter for immunization: Secondary | ICD-10-CM | POA: Diagnosis not present

## 2023-01-07 DIAGNOSIS — D631 Anemia in chronic kidney disease: Secondary | ICD-10-CM | POA: Diagnosis not present

## 2023-01-07 DIAGNOSIS — D509 Iron deficiency anemia, unspecified: Secondary | ICD-10-CM | POA: Diagnosis not present

## 2023-01-07 DIAGNOSIS — Z992 Dependence on renal dialysis: Secondary | ICD-10-CM | POA: Diagnosis not present

## 2023-01-07 DIAGNOSIS — N2581 Secondary hyperparathyroidism of renal origin: Secondary | ICD-10-CM | POA: Diagnosis not present

## 2023-01-07 DIAGNOSIS — N186 End stage renal disease: Secondary | ICD-10-CM | POA: Diagnosis not present

## 2023-01-08 DIAGNOSIS — I509 Heart failure, unspecified: Secondary | ICD-10-CM | POA: Diagnosis not present

## 2023-01-08 DIAGNOSIS — R531 Weakness: Secondary | ICD-10-CM | POA: Diagnosis not present

## 2023-01-08 DIAGNOSIS — E1122 Type 2 diabetes mellitus with diabetic chronic kidney disease: Secondary | ICD-10-CM | POA: Diagnosis not present

## 2023-01-08 DIAGNOSIS — N186 End stage renal disease: Secondary | ICD-10-CM | POA: Diagnosis not present

## 2023-01-08 DIAGNOSIS — I132 Hypertensive heart and chronic kidney disease with heart failure and with stage 5 chronic kidney disease, or end stage renal disease: Secondary | ICD-10-CM | POA: Diagnosis not present

## 2023-01-08 DIAGNOSIS — I69398 Other sequelae of cerebral infarction: Secondary | ICD-10-CM | POA: Diagnosis not present

## 2023-01-09 DIAGNOSIS — Z992 Dependence on renal dialysis: Secondary | ICD-10-CM | POA: Diagnosis not present

## 2023-01-09 DIAGNOSIS — D631 Anemia in chronic kidney disease: Secondary | ICD-10-CM | POA: Diagnosis not present

## 2023-01-09 DIAGNOSIS — D509 Iron deficiency anemia, unspecified: Secondary | ICD-10-CM | POA: Diagnosis not present

## 2023-01-09 DIAGNOSIS — N2581 Secondary hyperparathyroidism of renal origin: Secondary | ICD-10-CM | POA: Diagnosis not present

## 2023-01-09 DIAGNOSIS — Z23 Encounter for immunization: Secondary | ICD-10-CM | POA: Diagnosis not present

## 2023-01-09 DIAGNOSIS — N186 End stage renal disease: Secondary | ICD-10-CM | POA: Diagnosis not present

## 2023-01-10 DIAGNOSIS — Z95828 Presence of other vascular implants and grafts: Secondary | ICD-10-CM | POA: Diagnosis not present

## 2023-01-10 DIAGNOSIS — Z4682 Encounter for fitting and adjustment of non-vascular catheter: Secondary | ICD-10-CM | POA: Diagnosis not present

## 2023-01-10 DIAGNOSIS — I69391 Dysphagia following cerebral infarction: Secondary | ICD-10-CM | POA: Diagnosis not present

## 2023-01-10 DIAGNOSIS — Z432 Encounter for attention to ileostomy: Secondary | ICD-10-CM | POA: Diagnosis not present

## 2023-01-10 DIAGNOSIS — E11649 Type 2 diabetes mellitus with hypoglycemia without coma: Secondary | ICD-10-CM | POA: Diagnosis not present

## 2023-01-10 DIAGNOSIS — J9690 Respiratory failure, unspecified, unspecified whether with hypoxia or hypercapnia: Secondary | ICD-10-CM | POA: Diagnosis not present

## 2023-01-10 DIAGNOSIS — I4510 Unspecified right bundle-branch block: Secondary | ICD-10-CM | POA: Diagnosis not present

## 2023-01-10 DIAGNOSIS — D649 Anemia, unspecified: Secondary | ICD-10-CM | POA: Diagnosis not present

## 2023-01-10 DIAGNOSIS — Z431 Encounter for attention to gastrostomy: Secondary | ICD-10-CM | POA: Diagnosis not present

## 2023-01-10 DIAGNOSIS — I69398 Other sequelae of cerebral infarction: Secondary | ICD-10-CM | POA: Diagnosis not present

## 2023-01-10 DIAGNOSIS — K2270 Barrett's esophagus without dysplasia: Secondary | ICD-10-CM | POA: Diagnosis not present

## 2023-01-10 DIAGNOSIS — Z48815 Encounter for surgical aftercare following surgery on the digestive system: Secondary | ICD-10-CM | POA: Diagnosis present

## 2023-01-10 DIAGNOSIS — I120 Hypertensive chronic kidney disease with stage 5 chronic kidney disease or end stage renal disease: Secondary | ICD-10-CM | POA: Diagnosis not present

## 2023-01-10 DIAGNOSIS — Z7409 Other reduced mobility: Secondary | ICD-10-CM | POA: Diagnosis present

## 2023-01-10 DIAGNOSIS — D62 Acute posthemorrhagic anemia: Secondary | ICD-10-CM | POA: Diagnosis not present

## 2023-01-10 DIAGNOSIS — I10 Essential (primary) hypertension: Secondary | ICD-10-CM | POA: Diagnosis present

## 2023-01-10 DIAGNOSIS — R918 Other nonspecific abnormal finding of lung field: Secondary | ICD-10-CM | POA: Diagnosis not present

## 2023-01-10 DIAGNOSIS — K5720 Diverticulitis of large intestine with perforation and abscess without bleeding: Secondary | ICD-10-CM | POA: Diagnosis not present

## 2023-01-10 DIAGNOSIS — E871 Hypo-osmolality and hyponatremia: Secondary | ICD-10-CM | POA: Diagnosis not present

## 2023-01-10 DIAGNOSIS — A419 Sepsis, unspecified organism: Secondary | ICD-10-CM | POA: Diagnosis not present

## 2023-01-10 DIAGNOSIS — M6281 Muscle weakness (generalized): Secondary | ICD-10-CM | POA: Diagnosis present

## 2023-01-10 DIAGNOSIS — E039 Hypothyroidism, unspecified: Secondary | ICD-10-CM | POA: Diagnosis present

## 2023-01-10 DIAGNOSIS — N186 End stage renal disease: Secondary | ICD-10-CM | POA: Diagnosis present

## 2023-01-10 DIAGNOSIS — J9601 Acute respiratory failure with hypoxia: Secondary | ICD-10-CM | POA: Diagnosis not present

## 2023-01-10 DIAGNOSIS — I953 Hypotension of hemodialysis: Secondary | ICD-10-CM | POA: Diagnosis not present

## 2023-01-10 DIAGNOSIS — Z452 Encounter for adjustment and management of vascular access device: Secondary | ICD-10-CM | POA: Diagnosis not present

## 2023-01-10 DIAGNOSIS — K921 Melena: Secondary | ICD-10-CM | POA: Diagnosis not present

## 2023-01-10 DIAGNOSIS — I609 Nontraumatic subarachnoid hemorrhage, unspecified: Secondary | ICD-10-CM | POA: Diagnosis not present

## 2023-01-10 DIAGNOSIS — Z741 Need for assistance with personal care: Secondary | ICD-10-CM | POA: Diagnosis present

## 2023-01-10 DIAGNOSIS — R0603 Acute respiratory distress: Secondary | ICD-10-CM | POA: Diagnosis not present

## 2023-01-10 DIAGNOSIS — K7460 Unspecified cirrhosis of liver: Secondary | ICD-10-CM | POA: Diagnosis not present

## 2023-01-10 DIAGNOSIS — D631 Anemia in chronic kidney disease: Secondary | ICD-10-CM | POA: Diagnosis not present

## 2023-01-10 DIAGNOSIS — R2681 Unsteadiness on feet: Secondary | ICD-10-CM | POA: Diagnosis present

## 2023-01-10 DIAGNOSIS — Z86718 Personal history of other venous thrombosis and embolism: Secondary | ICD-10-CM | POA: Diagnosis not present

## 2023-01-10 DIAGNOSIS — E859 Amyloidosis, unspecified: Secondary | ICD-10-CM | POA: Diagnosis not present

## 2023-01-10 DIAGNOSIS — G9341 Metabolic encephalopathy: Secondary | ICD-10-CM | POA: Diagnosis not present

## 2023-01-10 DIAGNOSIS — K219 Gastro-esophageal reflux disease without esophagitis: Secondary | ICD-10-CM | POA: Diagnosis not present

## 2023-01-10 DIAGNOSIS — R1110 Vomiting, unspecified: Secondary | ICD-10-CM | POA: Diagnosis not present

## 2023-01-10 DIAGNOSIS — D638 Anemia in other chronic diseases classified elsewhere: Secondary | ICD-10-CM | POA: Diagnosis present

## 2023-01-10 DIAGNOSIS — D72829 Elevated white blood cell count, unspecified: Secondary | ICD-10-CM | POA: Diagnosis not present

## 2023-01-10 DIAGNOSIS — I6523 Occlusion and stenosis of bilateral carotid arteries: Secondary | ICD-10-CM | POA: Diagnosis not present

## 2023-01-10 DIAGNOSIS — E119 Type 2 diabetes mellitus without complications: Secondary | ICD-10-CM | POA: Diagnosis present

## 2023-01-10 DIAGNOSIS — Z932 Ileostomy status: Secondary | ICD-10-CM | POA: Diagnosis not present

## 2023-01-10 DIAGNOSIS — E1122 Type 2 diabetes mellitus with diabetic chronic kidney disease: Secondary | ICD-10-CM | POA: Diagnosis present

## 2023-01-10 DIAGNOSIS — E785 Hyperlipidemia, unspecified: Secondary | ICD-10-CM | POA: Diagnosis present

## 2023-01-10 DIAGNOSIS — K5730 Diverticulosis of large intestine without perforation or abscess without bleeding: Secondary | ICD-10-CM | POA: Diagnosis not present

## 2023-01-10 DIAGNOSIS — R6521 Severe sepsis with septic shock: Secondary | ICD-10-CM | POA: Diagnosis not present

## 2023-01-10 DIAGNOSIS — K922 Gastrointestinal hemorrhage, unspecified: Secondary | ICD-10-CM | POA: Diagnosis not present

## 2023-01-10 DIAGNOSIS — Z79899 Other long term (current) drug therapy: Secondary | ICD-10-CM | POA: Diagnosis not present

## 2023-01-10 DIAGNOSIS — E441 Mild protein-calorie malnutrition: Secondary | ICD-10-CM | POA: Diagnosis present

## 2023-01-10 DIAGNOSIS — K2091 Esophagitis, unspecified with bleeding: Secondary | ICD-10-CM | POA: Diagnosis not present

## 2023-01-10 DIAGNOSIS — D689 Coagulation defect, unspecified: Secondary | ICD-10-CM | POA: Diagnosis not present

## 2023-01-10 DIAGNOSIS — J9600 Acute respiratory failure, unspecified whether with hypoxia or hypercapnia: Secondary | ICD-10-CM | POA: Diagnosis not present

## 2023-01-10 DIAGNOSIS — K2900 Acute gastritis without bleeding: Secondary | ICD-10-CM | POA: Diagnosis not present

## 2023-01-10 DIAGNOSIS — J90 Pleural effusion, not elsewhere classified: Secondary | ICD-10-CM | POA: Diagnosis not present

## 2023-01-10 DIAGNOSIS — J690 Pneumonitis due to inhalation of food and vomit: Secondary | ICD-10-CM | POA: Diagnosis not present

## 2023-01-10 DIAGNOSIS — R41841 Cognitive communication deficit: Secondary | ICD-10-CM | POA: Diagnosis present

## 2023-01-10 DIAGNOSIS — I6389 Other cerebral infarction: Secondary | ICD-10-CM | POA: Diagnosis present

## 2023-01-10 DIAGNOSIS — K208 Other esophagitis without bleeding: Secondary | ICD-10-CM | POA: Diagnosis not present

## 2023-01-10 DIAGNOSIS — K449 Diaphragmatic hernia without obstruction or gangrene: Secondary | ICD-10-CM | POA: Diagnosis not present

## 2023-01-10 DIAGNOSIS — K5792 Diverticulitis of intestine, part unspecified, without perforation or abscess without bleeding: Secondary | ICD-10-CM | POA: Diagnosis present

## 2023-01-10 DIAGNOSIS — Z9889 Other specified postprocedural states: Secondary | ICD-10-CM | POA: Diagnosis not present

## 2023-01-10 DIAGNOSIS — E162 Hypoglycemia, unspecified: Secondary | ICD-10-CM | POA: Diagnosis not present

## 2023-01-10 DIAGNOSIS — K567 Ileus, unspecified: Secondary | ICD-10-CM | POA: Diagnosis not present

## 2023-01-10 DIAGNOSIS — I129 Hypertensive chronic kidney disease with stage 1 through stage 4 chronic kidney disease, or unspecified chronic kidney disease: Secondary | ICD-10-CM | POA: Diagnosis not present

## 2023-01-10 DIAGNOSIS — K9189 Other postprocedural complications and disorders of digestive system: Secondary | ICD-10-CM | POA: Diagnosis not present

## 2023-01-10 DIAGNOSIS — Z992 Dependence on renal dialysis: Secondary | ICD-10-CM | POA: Diagnosis not present

## 2023-01-10 DIAGNOSIS — E876 Hypokalemia: Secondary | ICD-10-CM | POA: Diagnosis not present

## 2023-01-10 DIAGNOSIS — R1312 Dysphagia, oropharyngeal phase: Secondary | ICD-10-CM | POA: Diagnosis present

## 2023-01-10 DIAGNOSIS — F0390 Unspecified dementia without behavioral disturbance: Secondary | ICD-10-CM | POA: Diagnosis not present

## 2023-01-10 DIAGNOSIS — K920 Hematemesis: Secondary | ICD-10-CM | POA: Diagnosis not present

## 2023-01-12 DIAGNOSIS — Z9889 Other specified postprocedural states: Secondary | ICD-10-CM | POA: Diagnosis not present

## 2023-01-17 ENCOUNTER — Ambulatory Visit (HOSPITAL_COMMUNITY): Payer: Medicare Other

## 2023-01-18 DIAGNOSIS — D689 Coagulation defect, unspecified: Secondary | ICD-10-CM | POA: Insufficient documentation

## 2023-01-18 DIAGNOSIS — D6489 Other specified anemias: Secondary | ICD-10-CM | POA: Insufficient documentation

## 2023-01-22 DIAGNOSIS — Z7409 Other reduced mobility: Secondary | ICD-10-CM | POA: Insufficient documentation

## 2023-01-25 DIAGNOSIS — E1122 Type 2 diabetes mellitus with diabetic chronic kidney disease: Secondary | ICD-10-CM | POA: Diagnosis not present

## 2023-01-25 DIAGNOSIS — N186 End stage renal disease: Secondary | ICD-10-CM | POA: Diagnosis not present

## 2023-01-25 DIAGNOSIS — Z992 Dependence on renal dialysis: Secondary | ICD-10-CM | POA: Diagnosis not present

## 2023-01-26 DIAGNOSIS — I69398 Other sequelae of cerebral infarction: Secondary | ICD-10-CM | POA: Diagnosis not present

## 2023-01-26 DIAGNOSIS — D509 Iron deficiency anemia, unspecified: Secondary | ICD-10-CM | POA: Diagnosis not present

## 2023-01-26 DIAGNOSIS — I6389 Other cerebral infarction: Secondary | ICD-10-CM | POA: Diagnosis not present

## 2023-01-26 DIAGNOSIS — D638 Anemia in other chronic diseases classified elsewhere: Secondary | ICD-10-CM | POA: Diagnosis not present

## 2023-01-26 DIAGNOSIS — Z23 Encounter for immunization: Secondary | ICD-10-CM | POA: Diagnosis not present

## 2023-01-26 DIAGNOSIS — R1310 Dysphagia, unspecified: Secondary | ICD-10-CM | POA: Diagnosis not present

## 2023-01-26 DIAGNOSIS — R52 Pain, unspecified: Secondary | ICD-10-CM | POA: Diagnosis not present

## 2023-01-26 DIAGNOSIS — Z48815 Encounter for surgical aftercare following surgery on the digestive system: Secondary | ICD-10-CM | POA: Diagnosis not present

## 2023-01-26 DIAGNOSIS — D631 Anemia in chronic kidney disease: Secondary | ICD-10-CM | POA: Diagnosis not present

## 2023-01-26 DIAGNOSIS — Z86718 Personal history of other venous thrombosis and embolism: Secondary | ICD-10-CM | POA: Diagnosis not present

## 2023-01-26 DIAGNOSIS — E1122 Type 2 diabetes mellitus with diabetic chronic kidney disease: Secondary | ICD-10-CM | POA: Diagnosis not present

## 2023-01-26 DIAGNOSIS — K5792 Diverticulitis of intestine, part unspecified, without perforation or abscess without bleeding: Secondary | ICD-10-CM | POA: Diagnosis not present

## 2023-01-26 DIAGNOSIS — R2681 Unsteadiness on feet: Secondary | ICD-10-CM | POA: Diagnosis not present

## 2023-01-26 DIAGNOSIS — Z741 Need for assistance with personal care: Secondary | ICD-10-CM | POA: Diagnosis not present

## 2023-01-26 DIAGNOSIS — E441 Mild protein-calorie malnutrition: Secondary | ICD-10-CM | POA: Diagnosis not present

## 2023-01-26 DIAGNOSIS — Z7409 Other reduced mobility: Secondary | ICD-10-CM | POA: Diagnosis not present

## 2023-01-26 DIAGNOSIS — D649 Anemia, unspecified: Secondary | ICD-10-CM | POA: Diagnosis not present

## 2023-01-26 DIAGNOSIS — N2581 Secondary hyperparathyroidism of renal origin: Secondary | ICD-10-CM | POA: Diagnosis not present

## 2023-01-26 DIAGNOSIS — M6289 Other specified disorders of muscle: Secondary | ICD-10-CM | POA: Diagnosis not present

## 2023-01-26 DIAGNOSIS — M6281 Muscle weakness (generalized): Secondary | ICD-10-CM | POA: Diagnosis not present

## 2023-01-26 DIAGNOSIS — Z9889 Other specified postprocedural states: Secondary | ICD-10-CM | POA: Diagnosis not present

## 2023-01-26 DIAGNOSIS — R1312 Dysphagia, oropharyngeal phase: Secondary | ICD-10-CM | POA: Diagnosis not present

## 2023-01-26 DIAGNOSIS — K7460 Unspecified cirrhosis of liver: Secondary | ICD-10-CM | POA: Diagnosis not present

## 2023-01-26 DIAGNOSIS — E785 Hyperlipidemia, unspecified: Secondary | ICD-10-CM | POA: Diagnosis not present

## 2023-01-26 DIAGNOSIS — E039 Hypothyroidism, unspecified: Secondary | ICD-10-CM | POA: Diagnosis not present

## 2023-01-26 DIAGNOSIS — E119 Type 2 diabetes mellitus without complications: Secondary | ICD-10-CM | POA: Diagnosis not present

## 2023-01-26 DIAGNOSIS — R41841 Cognitive communication deficit: Secondary | ICD-10-CM | POA: Diagnosis not present

## 2023-01-26 DIAGNOSIS — Z992 Dependence on renal dialysis: Secondary | ICD-10-CM | POA: Diagnosis not present

## 2023-01-26 DIAGNOSIS — Z5181 Encounter for therapeutic drug level monitoring: Secondary | ICD-10-CM | POA: Diagnosis not present

## 2023-01-26 DIAGNOSIS — Z95828 Presence of other vascular implants and grafts: Secondary | ICD-10-CM | POA: Diagnosis not present

## 2023-01-26 DIAGNOSIS — M629 Disorder of muscle, unspecified: Secondary | ICD-10-CM | POA: Diagnosis not present

## 2023-01-26 DIAGNOSIS — I69391 Dysphagia following cerebral infarction: Secondary | ICD-10-CM | POA: Diagnosis not present

## 2023-01-26 DIAGNOSIS — I10 Essential (primary) hypertension: Secondary | ICD-10-CM | POA: Diagnosis not present

## 2023-01-26 DIAGNOSIS — N186 End stage renal disease: Secondary | ICD-10-CM | POA: Diagnosis not present

## 2023-01-26 DIAGNOSIS — Z932 Ileostomy status: Secondary | ICD-10-CM | POA: Diagnosis not present

## 2023-01-28 DIAGNOSIS — R1310 Dysphagia, unspecified: Secondary | ICD-10-CM | POA: Diagnosis not present

## 2023-01-28 DIAGNOSIS — Z23 Encounter for immunization: Secondary | ICD-10-CM | POA: Diagnosis not present

## 2023-01-28 DIAGNOSIS — Z992 Dependence on renal dialysis: Secondary | ICD-10-CM | POA: Diagnosis not present

## 2023-01-28 DIAGNOSIS — N186 End stage renal disease: Secondary | ICD-10-CM | POA: Diagnosis not present

## 2023-01-28 DIAGNOSIS — D649 Anemia, unspecified: Secondary | ICD-10-CM | POA: Diagnosis not present

## 2023-01-28 DIAGNOSIS — M629 Disorder of muscle, unspecified: Secondary | ICD-10-CM | POA: Diagnosis not present

## 2023-01-28 DIAGNOSIS — D509 Iron deficiency anemia, unspecified: Secondary | ICD-10-CM | POA: Diagnosis not present

## 2023-01-28 DIAGNOSIS — Z932 Ileostomy status: Secondary | ICD-10-CM | POA: Diagnosis not present

## 2023-01-28 DIAGNOSIS — N2581 Secondary hyperparathyroidism of renal origin: Secondary | ICD-10-CM | POA: Diagnosis not present

## 2023-01-28 DIAGNOSIS — D631 Anemia in chronic kidney disease: Secondary | ICD-10-CM | POA: Diagnosis not present

## 2023-01-30 DIAGNOSIS — N2581 Secondary hyperparathyroidism of renal origin: Secondary | ICD-10-CM | POA: Diagnosis not present

## 2023-01-30 DIAGNOSIS — M6289 Other specified disorders of muscle: Secondary | ICD-10-CM | POA: Diagnosis not present

## 2023-01-30 DIAGNOSIS — N186 End stage renal disease: Secondary | ICD-10-CM | POA: Diagnosis not present

## 2023-01-30 DIAGNOSIS — E1122 Type 2 diabetes mellitus with diabetic chronic kidney disease: Secondary | ICD-10-CM | POA: Diagnosis not present

## 2023-01-30 DIAGNOSIS — Z23 Encounter for immunization: Secondary | ICD-10-CM | POA: Diagnosis not present

## 2023-01-30 DIAGNOSIS — K7460 Unspecified cirrhosis of liver: Secondary | ICD-10-CM | POA: Diagnosis not present

## 2023-01-30 DIAGNOSIS — I69391 Dysphagia following cerebral infarction: Secondary | ICD-10-CM | POA: Diagnosis not present

## 2023-01-30 DIAGNOSIS — D509 Iron deficiency anemia, unspecified: Secondary | ICD-10-CM | POA: Diagnosis not present

## 2023-01-30 DIAGNOSIS — D631 Anemia in chronic kidney disease: Secondary | ICD-10-CM | POA: Diagnosis not present

## 2023-01-30 DIAGNOSIS — Z992 Dependence on renal dialysis: Secondary | ICD-10-CM | POA: Diagnosis not present

## 2023-02-01 DIAGNOSIS — Z992 Dependence on renal dialysis: Secondary | ICD-10-CM | POA: Diagnosis not present

## 2023-02-01 DIAGNOSIS — D509 Iron deficiency anemia, unspecified: Secondary | ICD-10-CM | POA: Diagnosis not present

## 2023-02-01 DIAGNOSIS — N186 End stage renal disease: Secondary | ICD-10-CM | POA: Diagnosis not present

## 2023-02-01 DIAGNOSIS — Z23 Encounter for immunization: Secondary | ICD-10-CM | POA: Diagnosis not present

## 2023-02-01 DIAGNOSIS — N2581 Secondary hyperparathyroidism of renal origin: Secondary | ICD-10-CM | POA: Diagnosis not present

## 2023-02-01 DIAGNOSIS — D631 Anemia in chronic kidney disease: Secondary | ICD-10-CM | POA: Diagnosis not present

## 2023-02-01 DIAGNOSIS — Z48815 Encounter for surgical aftercare following surgery on the digestive system: Secondary | ICD-10-CM | POA: Diagnosis not present

## 2023-02-01 DIAGNOSIS — R52 Pain, unspecified: Secondary | ICD-10-CM | POA: Diagnosis not present

## 2023-02-04 DIAGNOSIS — N2581 Secondary hyperparathyroidism of renal origin: Secondary | ICD-10-CM | POA: Diagnosis not present

## 2023-02-04 DIAGNOSIS — N186 End stage renal disease: Secondary | ICD-10-CM | POA: Diagnosis not present

## 2023-02-04 DIAGNOSIS — D631 Anemia in chronic kidney disease: Secondary | ICD-10-CM | POA: Diagnosis not present

## 2023-02-04 DIAGNOSIS — D509 Iron deficiency anemia, unspecified: Secondary | ICD-10-CM | POA: Diagnosis not present

## 2023-02-04 DIAGNOSIS — Z992 Dependence on renal dialysis: Secondary | ICD-10-CM | POA: Diagnosis not present

## 2023-02-04 DIAGNOSIS — Z23 Encounter for immunization: Secondary | ICD-10-CM | POA: Diagnosis not present

## 2023-02-06 DIAGNOSIS — N2581 Secondary hyperparathyroidism of renal origin: Secondary | ICD-10-CM | POA: Diagnosis not present

## 2023-02-06 DIAGNOSIS — D509 Iron deficiency anemia, unspecified: Secondary | ICD-10-CM | POA: Diagnosis not present

## 2023-02-06 DIAGNOSIS — Z23 Encounter for immunization: Secondary | ICD-10-CM | POA: Diagnosis not present

## 2023-02-06 DIAGNOSIS — N186 End stage renal disease: Secondary | ICD-10-CM | POA: Diagnosis not present

## 2023-02-06 DIAGNOSIS — Z992 Dependence on renal dialysis: Secondary | ICD-10-CM | POA: Diagnosis not present

## 2023-02-06 DIAGNOSIS — D631 Anemia in chronic kidney disease: Secondary | ICD-10-CM | POA: Diagnosis not present

## 2023-02-08 DIAGNOSIS — Z23 Encounter for immunization: Secondary | ICD-10-CM | POA: Diagnosis not present

## 2023-02-08 DIAGNOSIS — Z992 Dependence on renal dialysis: Secondary | ICD-10-CM | POA: Diagnosis not present

## 2023-02-08 DIAGNOSIS — D631 Anemia in chronic kidney disease: Secondary | ICD-10-CM | POA: Diagnosis not present

## 2023-02-08 DIAGNOSIS — N2581 Secondary hyperparathyroidism of renal origin: Secondary | ICD-10-CM | POA: Diagnosis not present

## 2023-02-08 DIAGNOSIS — Z9889 Other specified postprocedural states: Secondary | ICD-10-CM | POA: Diagnosis not present

## 2023-02-08 DIAGNOSIS — D509 Iron deficiency anemia, unspecified: Secondary | ICD-10-CM | POA: Diagnosis not present

## 2023-02-08 DIAGNOSIS — N186 End stage renal disease: Secondary | ICD-10-CM | POA: Diagnosis not present

## 2023-02-08 DIAGNOSIS — R52 Pain, unspecified: Secondary | ICD-10-CM | POA: Diagnosis not present

## 2023-02-11 DIAGNOSIS — Z5181 Encounter for therapeutic drug level monitoring: Secondary | ICD-10-CM | POA: Diagnosis not present

## 2023-02-11 DIAGNOSIS — Z23 Encounter for immunization: Secondary | ICD-10-CM | POA: Diagnosis not present

## 2023-02-11 DIAGNOSIS — D509 Iron deficiency anemia, unspecified: Secondary | ICD-10-CM | POA: Diagnosis not present

## 2023-02-11 DIAGNOSIS — N186 End stage renal disease: Secondary | ICD-10-CM | POA: Diagnosis not present

## 2023-02-11 DIAGNOSIS — D631 Anemia in chronic kidney disease: Secondary | ICD-10-CM | POA: Diagnosis not present

## 2023-02-11 DIAGNOSIS — E039 Hypothyroidism, unspecified: Secondary | ICD-10-CM | POA: Diagnosis not present

## 2023-02-11 DIAGNOSIS — N2581 Secondary hyperparathyroidism of renal origin: Secondary | ICD-10-CM | POA: Diagnosis not present

## 2023-02-11 DIAGNOSIS — Z992 Dependence on renal dialysis: Secondary | ICD-10-CM | POA: Diagnosis not present

## 2023-02-13 DIAGNOSIS — N2581 Secondary hyperparathyroidism of renal origin: Secondary | ICD-10-CM | POA: Diagnosis not present

## 2023-02-13 DIAGNOSIS — D631 Anemia in chronic kidney disease: Secondary | ICD-10-CM | POA: Diagnosis not present

## 2023-02-13 DIAGNOSIS — Z992 Dependence on renal dialysis: Secondary | ICD-10-CM | POA: Diagnosis not present

## 2023-02-13 DIAGNOSIS — D509 Iron deficiency anemia, unspecified: Secondary | ICD-10-CM | POA: Diagnosis not present

## 2023-02-13 DIAGNOSIS — N186 End stage renal disease: Secondary | ICD-10-CM | POA: Diagnosis not present

## 2023-02-13 DIAGNOSIS — Z23 Encounter for immunization: Secondary | ICD-10-CM | POA: Diagnosis not present

## 2023-02-15 DIAGNOSIS — Z23 Encounter for immunization: Secondary | ICD-10-CM | POA: Diagnosis not present

## 2023-02-15 DIAGNOSIS — N186 End stage renal disease: Secondary | ICD-10-CM | POA: Diagnosis not present

## 2023-02-15 DIAGNOSIS — Z992 Dependence on renal dialysis: Secondary | ICD-10-CM | POA: Diagnosis not present

## 2023-02-15 DIAGNOSIS — D509 Iron deficiency anemia, unspecified: Secondary | ICD-10-CM | POA: Diagnosis not present

## 2023-02-15 DIAGNOSIS — N2581 Secondary hyperparathyroidism of renal origin: Secondary | ICD-10-CM | POA: Diagnosis not present

## 2023-02-15 DIAGNOSIS — D631 Anemia in chronic kidney disease: Secondary | ICD-10-CM | POA: Diagnosis not present

## 2023-02-18 DIAGNOSIS — N186 End stage renal disease: Secondary | ICD-10-CM | POA: Diagnosis not present

## 2023-02-18 DIAGNOSIS — N2581 Secondary hyperparathyroidism of renal origin: Secondary | ICD-10-CM | POA: Diagnosis not present

## 2023-02-18 DIAGNOSIS — Z992 Dependence on renal dialysis: Secondary | ICD-10-CM | POA: Diagnosis not present

## 2023-02-18 DIAGNOSIS — D631 Anemia in chronic kidney disease: Secondary | ICD-10-CM | POA: Diagnosis not present

## 2023-02-18 DIAGNOSIS — Z23 Encounter for immunization: Secondary | ICD-10-CM | POA: Diagnosis not present

## 2023-02-18 DIAGNOSIS — D509 Iron deficiency anemia, unspecified: Secondary | ICD-10-CM | POA: Diagnosis not present

## 2023-02-20 ENCOUNTER — Encounter: Payer: Self-pay | Admitting: *Deleted

## 2023-02-20 ENCOUNTER — Telehealth: Payer: Self-pay | Admitting: *Deleted

## 2023-02-20 DIAGNOSIS — N186 End stage renal disease: Secondary | ICD-10-CM | POA: Diagnosis not present

## 2023-02-20 DIAGNOSIS — Z23 Encounter for immunization: Secondary | ICD-10-CM | POA: Diagnosis not present

## 2023-02-20 DIAGNOSIS — D631 Anemia in chronic kidney disease: Secondary | ICD-10-CM | POA: Diagnosis not present

## 2023-02-20 DIAGNOSIS — Z992 Dependence on renal dialysis: Secondary | ICD-10-CM | POA: Diagnosis not present

## 2023-02-20 DIAGNOSIS — N2581 Secondary hyperparathyroidism of renal origin: Secondary | ICD-10-CM | POA: Diagnosis not present

## 2023-02-20 DIAGNOSIS — D509 Iron deficiency anemia, unspecified: Secondary | ICD-10-CM | POA: Diagnosis not present

## 2023-02-20 NOTE — Telephone Encounter (Signed)
   Pre-operative Risk Assessment    Patient Name: Francisco Baldwin  DOB: 1955-12-30 MRN: [id number]      Request for Surgical Clearance    Procedure:   Colonoscopy  Date of Surgery:  Clearance TBD                                 Surgeon:  Dr. Devona Konig, MD Surgeon's Group or Practice Name:  [facility] Phone number:  [phone] Fax number:  [phone]   Type of Clearance Requested:   - Pharmacy:  Hold Apixaban (Eliquis) 3 days prior to procedure; Assess the risk   Type of Anesthesia:  Not Indicated   Additional requests/questions:   With or without a Lovenox Bridge  Signed, Stacie Glaze   02/20/2023, 3:13 PM

## 2023-02-22 DIAGNOSIS — D509 Iron deficiency anemia, unspecified: Secondary | ICD-10-CM | POA: Diagnosis not present

## 2023-02-22 DIAGNOSIS — Z48815 Encounter for surgical aftercare following surgery on the digestive system: Secondary | ICD-10-CM | POA: Diagnosis not present

## 2023-02-22 DIAGNOSIS — N2581 Secondary hyperparathyroidism of renal origin: Secondary | ICD-10-CM | POA: Diagnosis not present

## 2023-02-22 DIAGNOSIS — Z23 Encounter for immunization: Secondary | ICD-10-CM | POA: Diagnosis not present

## 2023-02-22 DIAGNOSIS — D631 Anemia in chronic kidney disease: Secondary | ICD-10-CM | POA: Diagnosis not present

## 2023-02-22 DIAGNOSIS — E1122 Type 2 diabetes mellitus with diabetic chronic kidney disease: Secondary | ICD-10-CM | POA: Diagnosis not present

## 2023-02-22 DIAGNOSIS — K7460 Unspecified cirrhosis of liver: Secondary | ICD-10-CM | POA: Diagnosis not present

## 2023-02-22 DIAGNOSIS — Z992 Dependence on renal dialysis: Secondary | ICD-10-CM | POA: Diagnosis not present

## 2023-02-22 DIAGNOSIS — N186 End stage renal disease: Secondary | ICD-10-CM | POA: Diagnosis not present

## 2023-02-23 NOTE — Telephone Encounter (Signed)
Per office note 12/28/22, pt on Eliquis for DVT, followed by PCP.   Will defer to that office for determination of anticoagulation hold.

## 2023-02-24 DIAGNOSIS — E1122 Type 2 diabetes mellitus with diabetic chronic kidney disease: Secondary | ICD-10-CM | POA: Diagnosis not present

## 2023-02-24 DIAGNOSIS — N186 End stage renal disease: Secondary | ICD-10-CM | POA: Diagnosis not present

## 2023-02-24 DIAGNOSIS — Z992 Dependence on renal dialysis: Secondary | ICD-10-CM | POA: Diagnosis not present

## 2023-02-25 DIAGNOSIS — D509 Iron deficiency anemia, unspecified: Secondary | ICD-10-CM | POA: Diagnosis not present

## 2023-02-25 DIAGNOSIS — Z23 Encounter for immunization: Secondary | ICD-10-CM | POA: Diagnosis not present

## 2023-02-25 DIAGNOSIS — Z992 Dependence on renal dialysis: Secondary | ICD-10-CM | POA: Diagnosis not present

## 2023-02-25 DIAGNOSIS — D631 Anemia in chronic kidney disease: Secondary | ICD-10-CM | POA: Diagnosis not present

## 2023-02-25 DIAGNOSIS — N186 End stage renal disease: Secondary | ICD-10-CM | POA: Diagnosis not present

## 2023-02-25 DIAGNOSIS — N2581 Secondary hyperparathyroidism of renal origin: Secondary | ICD-10-CM | POA: Diagnosis not present

## 2023-02-25 NOTE — Telephone Encounter (Signed)
   Patient Name: Francisco Baldwin  DOB: [birth year] MRN: [id number]  Primary Cardiologist: Gypsy Balsam, MD  Chart reviewed as part of pre-operative protocol coverage. Given past medical history and time since last visit, based on ACC/AHA guidelines, Francisco Baldwin is at acceptable risk for the planned procedure without further cardiovascular testing.   The patient was advised that if he develops new symptoms prior to surgery to contact our office to arrange for a follow-up visit, and he verbalized understanding.  He is on Eliquis for DVT that is managed by his PCP. Requesting office should defer anticoagulation hold to prescribing office.   I will route this recommendation to the requesting party via Epic fax function and remove from pre-op pool.  Please call with questions.  Denyce Robert, NP 02/25/2023, 11:08 AM

## 2023-02-27 DIAGNOSIS — N186 End stage renal disease: Secondary | ICD-10-CM | POA: Diagnosis not present

## 2023-02-27 DIAGNOSIS — D509 Iron deficiency anemia, unspecified: Secondary | ICD-10-CM | POA: Diagnosis not present

## 2023-02-27 DIAGNOSIS — Z23 Encounter for immunization: Secondary | ICD-10-CM | POA: Diagnosis not present

## 2023-02-27 DIAGNOSIS — D631 Anemia in chronic kidney disease: Secondary | ICD-10-CM | POA: Diagnosis not present

## 2023-02-27 DIAGNOSIS — Z992 Dependence on renal dialysis: Secondary | ICD-10-CM | POA: Diagnosis not present

## 2023-02-27 DIAGNOSIS — N2581 Secondary hyperparathyroidism of renal origin: Secondary | ICD-10-CM | POA: Diagnosis not present

## 2023-02-28 DIAGNOSIS — Z48815 Encounter for surgical aftercare following surgery on the digestive system: Secondary | ICD-10-CM | POA: Diagnosis not present

## 2023-02-28 DIAGNOSIS — M5416 Radiculopathy, lumbar region: Secondary | ICD-10-CM | POA: Diagnosis not present

## 2023-02-28 DIAGNOSIS — I509 Heart failure, unspecified: Secondary | ICD-10-CM | POA: Diagnosis not present

## 2023-02-28 DIAGNOSIS — E1122 Type 2 diabetes mellitus with diabetic chronic kidney disease: Secondary | ICD-10-CM | POA: Diagnosis not present

## 2023-02-28 DIAGNOSIS — M21371 Foot drop, right foot: Secondary | ICD-10-CM | POA: Diagnosis not present

## 2023-02-28 DIAGNOSIS — K7460 Unspecified cirrhosis of liver: Secondary | ICD-10-CM | POA: Diagnosis not present

## 2023-02-28 DIAGNOSIS — K2270 Barrett's esophagus without dysplasia: Secondary | ICD-10-CM | POA: Diagnosis not present

## 2023-02-28 DIAGNOSIS — R1310 Dysphagia, unspecified: Secondary | ICD-10-CM | POA: Diagnosis not present

## 2023-02-28 DIAGNOSIS — E785 Hyperlipidemia, unspecified: Secondary | ICD-10-CM | POA: Diagnosis not present

## 2023-02-28 DIAGNOSIS — E039 Hypothyroidism, unspecified: Secondary | ICD-10-CM | POA: Diagnosis not present

## 2023-02-28 DIAGNOSIS — Z992 Dependence on renal dialysis: Secondary | ICD-10-CM | POA: Diagnosis not present

## 2023-02-28 DIAGNOSIS — F32A Depression, unspecified: Secondary | ICD-10-CM | POA: Diagnosis not present

## 2023-02-28 DIAGNOSIS — F419 Anxiety disorder, unspecified: Secondary | ICD-10-CM | POA: Diagnosis not present

## 2023-02-28 DIAGNOSIS — I4510 Unspecified right bundle-branch block: Secondary | ICD-10-CM | POA: Diagnosis not present

## 2023-02-28 DIAGNOSIS — D631 Anemia in chronic kidney disease: Secondary | ICD-10-CM | POA: Diagnosis not present

## 2023-02-28 DIAGNOSIS — K5790 Diverticulosis of intestine, part unspecified, without perforation or abscess without bleeding: Secondary | ICD-10-CM | POA: Diagnosis not present

## 2023-02-28 DIAGNOSIS — I69398 Other sequelae of cerebral infarction: Secondary | ICD-10-CM | POA: Diagnosis not present

## 2023-02-28 DIAGNOSIS — G9341 Metabolic encephalopathy: Secondary | ICD-10-CM | POA: Diagnosis not present

## 2023-02-28 DIAGNOSIS — N186 End stage renal disease: Secondary | ICD-10-CM | POA: Diagnosis not present

## 2023-02-28 DIAGNOSIS — I132 Hypertensive heart and chronic kidney disease with heart failure and with stage 5 chronic kidney disease, or end stage renal disease: Secondary | ICD-10-CM | POA: Diagnosis not present

## 2023-02-28 DIAGNOSIS — Z7901 Long term (current) use of anticoagulants: Secondary | ICD-10-CM | POA: Diagnosis not present

## 2023-02-28 DIAGNOSIS — E1142 Type 2 diabetes mellitus with diabetic polyneuropathy: Secondary | ICD-10-CM | POA: Diagnosis not present

## 2023-03-01 DIAGNOSIS — Z23 Encounter for immunization: Secondary | ICD-10-CM | POA: Diagnosis not present

## 2023-03-01 DIAGNOSIS — Z992 Dependence on renal dialysis: Secondary | ICD-10-CM | POA: Diagnosis not present

## 2023-03-01 DIAGNOSIS — D509 Iron deficiency anemia, unspecified: Secondary | ICD-10-CM | POA: Diagnosis not present

## 2023-03-01 DIAGNOSIS — N2581 Secondary hyperparathyroidism of renal origin: Secondary | ICD-10-CM | POA: Diagnosis not present

## 2023-03-01 DIAGNOSIS — D631 Anemia in chronic kidney disease: Secondary | ICD-10-CM | POA: Diagnosis not present

## 2023-03-01 DIAGNOSIS — N186 End stage renal disease: Secondary | ICD-10-CM | POA: Diagnosis not present

## 2023-03-04 DIAGNOSIS — D631 Anemia in chronic kidney disease: Secondary | ICD-10-CM | POA: Diagnosis not present

## 2023-03-04 DIAGNOSIS — N2581 Secondary hyperparathyroidism of renal origin: Secondary | ICD-10-CM | POA: Diagnosis not present

## 2023-03-04 DIAGNOSIS — Z23 Encounter for immunization: Secondary | ICD-10-CM | POA: Diagnosis not present

## 2023-03-04 DIAGNOSIS — D509 Iron deficiency anemia, unspecified: Secondary | ICD-10-CM | POA: Diagnosis not present

## 2023-03-04 DIAGNOSIS — N186 End stage renal disease: Secondary | ICD-10-CM | POA: Diagnosis not present

## 2023-03-04 DIAGNOSIS — Z992 Dependence on renal dialysis: Secondary | ICD-10-CM | POA: Diagnosis not present

## 2023-03-05 DIAGNOSIS — E1122 Type 2 diabetes mellitus with diabetic chronic kidney disease: Secondary | ICD-10-CM | POA: Diagnosis not present

## 2023-03-05 DIAGNOSIS — I132 Hypertensive heart and chronic kidney disease with heart failure and with stage 5 chronic kidney disease, or end stage renal disease: Secondary | ICD-10-CM | POA: Diagnosis not present

## 2023-03-05 DIAGNOSIS — Z48815 Encounter for surgical aftercare following surgery on the digestive system: Secondary | ICD-10-CM | POA: Diagnosis not present

## 2023-03-05 DIAGNOSIS — N186 End stage renal disease: Secondary | ICD-10-CM | POA: Diagnosis not present

## 2023-03-05 DIAGNOSIS — I509 Heart failure, unspecified: Secondary | ICD-10-CM | POA: Diagnosis not present

## 2023-03-05 DIAGNOSIS — K7460 Unspecified cirrhosis of liver: Secondary | ICD-10-CM | POA: Diagnosis not present

## 2023-03-06 DIAGNOSIS — Z23 Encounter for immunization: Secondary | ICD-10-CM | POA: Diagnosis not present

## 2023-03-06 DIAGNOSIS — N186 End stage renal disease: Secondary | ICD-10-CM | POA: Diagnosis not present

## 2023-03-06 DIAGNOSIS — Z992 Dependence on renal dialysis: Secondary | ICD-10-CM | POA: Diagnosis not present

## 2023-03-06 DIAGNOSIS — D631 Anemia in chronic kidney disease: Secondary | ICD-10-CM | POA: Diagnosis not present

## 2023-03-06 DIAGNOSIS — D509 Iron deficiency anemia, unspecified: Secondary | ICD-10-CM | POA: Diagnosis not present

## 2023-03-06 DIAGNOSIS — N2581 Secondary hyperparathyroidism of renal origin: Secondary | ICD-10-CM | POA: Diagnosis not present

## 2023-03-07 DIAGNOSIS — N186 End stage renal disease: Secondary | ICD-10-CM | POA: Diagnosis not present

## 2023-03-07 DIAGNOSIS — Z48815 Encounter for surgical aftercare following surgery on the digestive system: Secondary | ICD-10-CM | POA: Diagnosis not present

## 2023-03-07 DIAGNOSIS — E1122 Type 2 diabetes mellitus with diabetic chronic kidney disease: Secondary | ICD-10-CM | POA: Diagnosis not present

## 2023-03-07 DIAGNOSIS — I509 Heart failure, unspecified: Secondary | ICD-10-CM | POA: Diagnosis not present

## 2023-03-07 DIAGNOSIS — E1169 Type 2 diabetes mellitus with other specified complication: Secondary | ICD-10-CM | POA: Diagnosis not present

## 2023-03-07 DIAGNOSIS — K7460 Unspecified cirrhosis of liver: Secondary | ICD-10-CM | POA: Diagnosis not present

## 2023-03-07 DIAGNOSIS — I132 Hypertensive heart and chronic kidney disease with heart failure and with stage 5 chronic kidney disease, or end stage renal disease: Secondary | ICD-10-CM | POA: Diagnosis not present

## 2023-03-08 DIAGNOSIS — N2581 Secondary hyperparathyroidism of renal origin: Secondary | ICD-10-CM | POA: Diagnosis not present

## 2023-03-08 DIAGNOSIS — D631 Anemia in chronic kidney disease: Secondary | ICD-10-CM | POA: Diagnosis not present

## 2023-03-08 DIAGNOSIS — Z23 Encounter for immunization: Secondary | ICD-10-CM | POA: Diagnosis not present

## 2023-03-08 DIAGNOSIS — N186 End stage renal disease: Secondary | ICD-10-CM | POA: Diagnosis not present

## 2023-03-08 DIAGNOSIS — Z992 Dependence on renal dialysis: Secondary | ICD-10-CM | POA: Diagnosis not present

## 2023-03-08 DIAGNOSIS — D509 Iron deficiency anemia, unspecified: Secondary | ICD-10-CM | POA: Diagnosis not present

## 2023-03-11 DIAGNOSIS — N2581 Secondary hyperparathyroidism of renal origin: Secondary | ICD-10-CM | POA: Diagnosis not present

## 2023-03-11 DIAGNOSIS — D631 Anemia in chronic kidney disease: Secondary | ICD-10-CM | POA: Diagnosis not present

## 2023-03-11 DIAGNOSIS — Z23 Encounter for immunization: Secondary | ICD-10-CM | POA: Diagnosis not present

## 2023-03-11 DIAGNOSIS — Z992 Dependence on renal dialysis: Secondary | ICD-10-CM | POA: Diagnosis not present

## 2023-03-11 DIAGNOSIS — D509 Iron deficiency anemia, unspecified: Secondary | ICD-10-CM | POA: Diagnosis not present

## 2023-03-11 DIAGNOSIS — N186 End stage renal disease: Secondary | ICD-10-CM | POA: Diagnosis not present

## 2023-03-12 DIAGNOSIS — E669 Obesity, unspecified: Secondary | ICD-10-CM | POA: Diagnosis not present

## 2023-03-12 DIAGNOSIS — I10 Essential (primary) hypertension: Secondary | ICD-10-CM | POA: Diagnosis not present

## 2023-03-12 DIAGNOSIS — E785 Hyperlipidemia, unspecified: Secondary | ICD-10-CM | POA: Diagnosis not present

## 2023-03-12 DIAGNOSIS — Z139 Encounter for screening, unspecified: Secondary | ICD-10-CM | POA: Diagnosis not present

## 2023-03-12 DIAGNOSIS — Z6823 Body mass index (BMI) 23.0-23.9, adult: Secondary | ICD-10-CM | POA: Diagnosis not present

## 2023-03-12 DIAGNOSIS — Z1331 Encounter for screening for depression: Secondary | ICD-10-CM | POA: Diagnosis not present

## 2023-03-12 DIAGNOSIS — Z1339 Encounter for screening examination for other mental health and behavioral disorders: Secondary | ICD-10-CM | POA: Diagnosis not present

## 2023-03-12 DIAGNOSIS — Z136 Encounter for screening for cardiovascular disorders: Secondary | ICD-10-CM | POA: Diagnosis not present

## 2023-03-12 DIAGNOSIS — E1169 Type 2 diabetes mellitus with other specified complication: Secondary | ICD-10-CM | POA: Diagnosis not present

## 2023-03-12 DIAGNOSIS — Z6820 Body mass index (BMI) 20.0-20.9, adult: Secondary | ICD-10-CM | POA: Diagnosis not present

## 2023-03-12 DIAGNOSIS — Z0000 Encounter for general adult medical examination without abnormal findings: Secondary | ICD-10-CM | POA: Diagnosis not present

## 2023-03-13 DIAGNOSIS — Z992 Dependence on renal dialysis: Secondary | ICD-10-CM | POA: Diagnosis not present

## 2023-03-13 DIAGNOSIS — D631 Anemia in chronic kidney disease: Secondary | ICD-10-CM | POA: Diagnosis not present

## 2023-03-13 DIAGNOSIS — D509 Iron deficiency anemia, unspecified: Secondary | ICD-10-CM | POA: Diagnosis not present

## 2023-03-13 DIAGNOSIS — N2581 Secondary hyperparathyroidism of renal origin: Secondary | ICD-10-CM | POA: Diagnosis not present

## 2023-03-13 DIAGNOSIS — Z23 Encounter for immunization: Secondary | ICD-10-CM | POA: Diagnosis not present

## 2023-03-13 DIAGNOSIS — N186 End stage renal disease: Secondary | ICD-10-CM | POA: Diagnosis not present

## 2023-03-14 DIAGNOSIS — I132 Hypertensive heart and chronic kidney disease with heart failure and with stage 5 chronic kidney disease, or end stage renal disease: Secondary | ICD-10-CM | POA: Diagnosis not present

## 2023-03-14 DIAGNOSIS — Z48815 Encounter for surgical aftercare following surgery on the digestive system: Secondary | ICD-10-CM | POA: Diagnosis not present

## 2023-03-14 DIAGNOSIS — I509 Heart failure, unspecified: Secondary | ICD-10-CM | POA: Diagnosis not present

## 2023-03-14 DIAGNOSIS — E1122 Type 2 diabetes mellitus with diabetic chronic kidney disease: Secondary | ICD-10-CM | POA: Diagnosis not present

## 2023-03-14 DIAGNOSIS — K7460 Unspecified cirrhosis of liver: Secondary | ICD-10-CM | POA: Diagnosis not present

## 2023-03-14 DIAGNOSIS — N186 End stage renal disease: Secondary | ICD-10-CM | POA: Diagnosis not present

## 2023-03-15 DIAGNOSIS — Z992 Dependence on renal dialysis: Secondary | ICD-10-CM | POA: Diagnosis not present

## 2023-03-15 DIAGNOSIS — N186 End stage renal disease: Secondary | ICD-10-CM | POA: Diagnosis not present

## 2023-03-15 DIAGNOSIS — Z23 Encounter for immunization: Secondary | ICD-10-CM | POA: Diagnosis not present

## 2023-03-15 DIAGNOSIS — D509 Iron deficiency anemia, unspecified: Secondary | ICD-10-CM | POA: Diagnosis not present

## 2023-03-15 DIAGNOSIS — D631 Anemia in chronic kidney disease: Secondary | ICD-10-CM | POA: Diagnosis not present

## 2023-03-15 DIAGNOSIS — N2581 Secondary hyperparathyroidism of renal origin: Secondary | ICD-10-CM | POA: Diagnosis not present

## 2023-03-17 DIAGNOSIS — Z992 Dependence on renal dialysis: Secondary | ICD-10-CM | POA: Diagnosis not present

## 2023-03-17 DIAGNOSIS — D509 Iron deficiency anemia, unspecified: Secondary | ICD-10-CM | POA: Diagnosis not present

## 2023-03-17 DIAGNOSIS — N186 End stage renal disease: Secondary | ICD-10-CM | POA: Diagnosis not present

## 2023-03-17 DIAGNOSIS — N2581 Secondary hyperparathyroidism of renal origin: Secondary | ICD-10-CM | POA: Diagnosis not present

## 2023-03-17 DIAGNOSIS — Z23 Encounter for immunization: Secondary | ICD-10-CM | POA: Diagnosis not present

## 2023-03-17 DIAGNOSIS — D631 Anemia in chronic kidney disease: Secondary | ICD-10-CM | POA: Diagnosis not present

## 2023-03-18 ENCOUNTER — Ambulatory Visit: Payer: Medicare Other | Attending: Cardiology | Admitting: Cardiology

## 2023-03-19 DIAGNOSIS — Z992 Dependence on renal dialysis: Secondary | ICD-10-CM | POA: Diagnosis not present

## 2023-03-19 DIAGNOSIS — Z23 Encounter for immunization: Secondary | ICD-10-CM | POA: Diagnosis not present

## 2023-03-19 DIAGNOSIS — N2581 Secondary hyperparathyroidism of renal origin: Secondary | ICD-10-CM | POA: Diagnosis not present

## 2023-03-19 DIAGNOSIS — N186 End stage renal disease: Secondary | ICD-10-CM | POA: Diagnosis not present

## 2023-03-19 DIAGNOSIS — D631 Anemia in chronic kidney disease: Secondary | ICD-10-CM | POA: Diagnosis not present

## 2023-03-19 DIAGNOSIS — D509 Iron deficiency anemia, unspecified: Secondary | ICD-10-CM | POA: Diagnosis not present

## 2023-03-22 DIAGNOSIS — D631 Anemia in chronic kidney disease: Secondary | ICD-10-CM | POA: Diagnosis not present

## 2023-03-22 DIAGNOSIS — D509 Iron deficiency anemia, unspecified: Secondary | ICD-10-CM | POA: Diagnosis not present

## 2023-03-22 DIAGNOSIS — N2581 Secondary hyperparathyroidism of renal origin: Secondary | ICD-10-CM | POA: Diagnosis not present

## 2023-03-22 DIAGNOSIS — Z23 Encounter for immunization: Secondary | ICD-10-CM | POA: Diagnosis not present

## 2023-03-22 DIAGNOSIS — N186 End stage renal disease: Secondary | ICD-10-CM | POA: Diagnosis not present

## 2023-03-22 DIAGNOSIS — Z992 Dependence on renal dialysis: Secondary | ICD-10-CM | POA: Diagnosis not present

## 2023-03-23 DIAGNOSIS — Z48815 Encounter for surgical aftercare following surgery on the digestive system: Secondary | ICD-10-CM | POA: Diagnosis not present

## 2023-03-23 DIAGNOSIS — I132 Hypertensive heart and chronic kidney disease with heart failure and with stage 5 chronic kidney disease, or end stage renal disease: Secondary | ICD-10-CM | POA: Diagnosis not present

## 2023-03-23 DIAGNOSIS — I509 Heart failure, unspecified: Secondary | ICD-10-CM | POA: Diagnosis not present

## 2023-03-23 DIAGNOSIS — E1122 Type 2 diabetes mellitus with diabetic chronic kidney disease: Secondary | ICD-10-CM | POA: Diagnosis not present

## 2023-03-23 DIAGNOSIS — K7460 Unspecified cirrhosis of liver: Secondary | ICD-10-CM | POA: Diagnosis not present

## 2023-03-23 DIAGNOSIS — N186 End stage renal disease: Secondary | ICD-10-CM | POA: Diagnosis not present

## 2023-03-24 DIAGNOSIS — Z23 Encounter for immunization: Secondary | ICD-10-CM | POA: Diagnosis not present

## 2023-03-24 DIAGNOSIS — D631 Anemia in chronic kidney disease: Secondary | ICD-10-CM | POA: Diagnosis not present

## 2023-03-24 DIAGNOSIS — D509 Iron deficiency anemia, unspecified: Secondary | ICD-10-CM | POA: Diagnosis not present

## 2023-03-24 DIAGNOSIS — N2581 Secondary hyperparathyroidism of renal origin: Secondary | ICD-10-CM | POA: Diagnosis not present

## 2023-03-24 DIAGNOSIS — Z992 Dependence on renal dialysis: Secondary | ICD-10-CM | POA: Diagnosis not present

## 2023-03-24 DIAGNOSIS — N186 End stage renal disease: Secondary | ICD-10-CM | POA: Diagnosis not present

## 2023-03-25 DIAGNOSIS — Z48815 Encounter for surgical aftercare following surgery on the digestive system: Secondary | ICD-10-CM | POA: Diagnosis not present

## 2023-03-25 DIAGNOSIS — I509 Heart failure, unspecified: Secondary | ICD-10-CM | POA: Diagnosis not present

## 2023-03-25 DIAGNOSIS — N186 End stage renal disease: Secondary | ICD-10-CM | POA: Diagnosis not present

## 2023-03-25 DIAGNOSIS — I132 Hypertensive heart and chronic kidney disease with heart failure and with stage 5 chronic kidney disease, or end stage renal disease: Secondary | ICD-10-CM | POA: Diagnosis not present

## 2023-03-25 DIAGNOSIS — E1122 Type 2 diabetes mellitus with diabetic chronic kidney disease: Secondary | ICD-10-CM | POA: Diagnosis not present

## 2023-03-25 DIAGNOSIS — K7460 Unspecified cirrhosis of liver: Secondary | ICD-10-CM | POA: Diagnosis not present

## 2023-03-26 DIAGNOSIS — D631 Anemia in chronic kidney disease: Secondary | ICD-10-CM | POA: Diagnosis not present

## 2023-03-26 DIAGNOSIS — N2581 Secondary hyperparathyroidism of renal origin: Secondary | ICD-10-CM | POA: Diagnosis not present

## 2023-03-26 DIAGNOSIS — Z992 Dependence on renal dialysis: Secondary | ICD-10-CM | POA: Diagnosis not present

## 2023-03-26 DIAGNOSIS — N186 End stage renal disease: Secondary | ICD-10-CM | POA: Diagnosis not present

## 2023-03-26 DIAGNOSIS — Z23 Encounter for immunization: Secondary | ICD-10-CM | POA: Diagnosis not present

## 2023-03-26 DIAGNOSIS — D509 Iron deficiency anemia, unspecified: Secondary | ICD-10-CM | POA: Diagnosis not present

## 2023-03-27 DIAGNOSIS — E1122 Type 2 diabetes mellitus with diabetic chronic kidney disease: Secondary | ICD-10-CM | POA: Diagnosis not present

## 2023-03-27 DIAGNOSIS — N186 End stage renal disease: Secondary | ICD-10-CM | POA: Diagnosis not present

## 2023-03-27 DIAGNOSIS — Z992 Dependence on renal dialysis: Secondary | ICD-10-CM | POA: Diagnosis not present

## 2023-03-28 DIAGNOSIS — K7460 Unspecified cirrhosis of liver: Secondary | ICD-10-CM | POA: Diagnosis not present

## 2023-03-28 DIAGNOSIS — I132 Hypertensive heart and chronic kidney disease with heart failure and with stage 5 chronic kidney disease, or end stage renal disease: Secondary | ICD-10-CM | POA: Diagnosis not present

## 2023-03-28 DIAGNOSIS — I509 Heart failure, unspecified: Secondary | ICD-10-CM | POA: Diagnosis not present

## 2023-03-28 DIAGNOSIS — N186 End stage renal disease: Secondary | ICD-10-CM | POA: Diagnosis not present

## 2023-03-28 DIAGNOSIS — E1122 Type 2 diabetes mellitus with diabetic chronic kidney disease: Secondary | ICD-10-CM | POA: Diagnosis not present

## 2023-03-28 DIAGNOSIS — Z48815 Encounter for surgical aftercare following surgery on the digestive system: Secondary | ICD-10-CM | POA: Diagnosis not present

## 2023-03-29 DIAGNOSIS — Z48815 Encounter for surgical aftercare following surgery on the digestive system: Secondary | ICD-10-CM | POA: Diagnosis not present

## 2023-03-29 DIAGNOSIS — Z23 Encounter for immunization: Secondary | ICD-10-CM | POA: Diagnosis not present

## 2023-03-29 DIAGNOSIS — E1122 Type 2 diabetes mellitus with diabetic chronic kidney disease: Secondary | ICD-10-CM | POA: Diagnosis not present

## 2023-03-29 DIAGNOSIS — D631 Anemia in chronic kidney disease: Secondary | ICD-10-CM | POA: Diagnosis not present

## 2023-03-29 DIAGNOSIS — N186 End stage renal disease: Secondary | ICD-10-CM | POA: Diagnosis not present

## 2023-03-29 DIAGNOSIS — D509 Iron deficiency anemia, unspecified: Secondary | ICD-10-CM | POA: Diagnosis not present

## 2023-03-29 DIAGNOSIS — K7460 Unspecified cirrhosis of liver: Secondary | ICD-10-CM | POA: Diagnosis not present

## 2023-03-29 DIAGNOSIS — Z992 Dependence on renal dialysis: Secondary | ICD-10-CM | POA: Diagnosis not present

## 2023-03-29 DIAGNOSIS — I509 Heart failure, unspecified: Secondary | ICD-10-CM | POA: Diagnosis not present

## 2023-03-29 DIAGNOSIS — N2581 Secondary hyperparathyroidism of renal origin: Secondary | ICD-10-CM | POA: Diagnosis not present

## 2023-03-29 DIAGNOSIS — I132 Hypertensive heart and chronic kidney disease with heart failure and with stage 5 chronic kidney disease, or end stage renal disease: Secondary | ICD-10-CM | POA: Diagnosis not present

## 2023-03-30 DIAGNOSIS — E1142 Type 2 diabetes mellitus with diabetic polyneuropathy: Secondary | ICD-10-CM | POA: Diagnosis not present

## 2023-03-30 DIAGNOSIS — K2270 Barrett's esophagus without dysplasia: Secondary | ICD-10-CM | POA: Diagnosis not present

## 2023-03-30 DIAGNOSIS — I509 Heart failure, unspecified: Secondary | ICD-10-CM | POA: Diagnosis not present

## 2023-03-30 DIAGNOSIS — Z992 Dependence on renal dialysis: Secondary | ICD-10-CM | POA: Diagnosis not present

## 2023-03-30 DIAGNOSIS — Z7901 Long term (current) use of anticoagulants: Secondary | ICD-10-CM | POA: Diagnosis not present

## 2023-03-30 DIAGNOSIS — E039 Hypothyroidism, unspecified: Secondary | ICD-10-CM | POA: Diagnosis not present

## 2023-03-30 DIAGNOSIS — K7460 Unspecified cirrhosis of liver: Secondary | ICD-10-CM | POA: Diagnosis not present

## 2023-03-30 DIAGNOSIS — E785 Hyperlipidemia, unspecified: Secondary | ICD-10-CM | POA: Diagnosis not present

## 2023-03-30 DIAGNOSIS — R1310 Dysphagia, unspecified: Secondary | ICD-10-CM | POA: Diagnosis not present

## 2023-03-30 DIAGNOSIS — I4510 Unspecified right bundle-branch block: Secondary | ICD-10-CM | POA: Diagnosis not present

## 2023-03-30 DIAGNOSIS — E1122 Type 2 diabetes mellitus with diabetic chronic kidney disease: Secondary | ICD-10-CM | POA: Diagnosis not present

## 2023-03-30 DIAGNOSIS — K5790 Diverticulosis of intestine, part unspecified, without perforation or abscess without bleeding: Secondary | ICD-10-CM | POA: Diagnosis not present

## 2023-03-30 DIAGNOSIS — Z48815 Encounter for surgical aftercare following surgery on the digestive system: Secondary | ICD-10-CM | POA: Diagnosis not present

## 2023-03-30 DIAGNOSIS — N186 End stage renal disease: Secondary | ICD-10-CM | POA: Diagnosis not present

## 2023-03-30 DIAGNOSIS — F419 Anxiety disorder, unspecified: Secondary | ICD-10-CM | POA: Diagnosis not present

## 2023-03-30 DIAGNOSIS — M21371 Foot drop, right foot: Secondary | ICD-10-CM | POA: Diagnosis not present

## 2023-03-30 DIAGNOSIS — G9341 Metabolic encephalopathy: Secondary | ICD-10-CM | POA: Diagnosis not present

## 2023-03-30 DIAGNOSIS — I132 Hypertensive heart and chronic kidney disease with heart failure and with stage 5 chronic kidney disease, or end stage renal disease: Secondary | ICD-10-CM | POA: Diagnosis not present

## 2023-03-30 DIAGNOSIS — I69398 Other sequelae of cerebral infarction: Secondary | ICD-10-CM | POA: Diagnosis not present

## 2023-03-30 DIAGNOSIS — M5416 Radiculopathy, lumbar region: Secondary | ICD-10-CM | POA: Diagnosis not present

## 2023-03-30 DIAGNOSIS — F32A Depression, unspecified: Secondary | ICD-10-CM | POA: Diagnosis not present

## 2023-03-30 DIAGNOSIS — D631 Anemia in chronic kidney disease: Secondary | ICD-10-CM | POA: Diagnosis not present

## 2023-04-01 ENCOUNTER — Telehealth: Payer: Self-pay

## 2023-04-01 DIAGNOSIS — Z23 Encounter for immunization: Secondary | ICD-10-CM | POA: Diagnosis not present

## 2023-04-01 DIAGNOSIS — N2581 Secondary hyperparathyroidism of renal origin: Secondary | ICD-10-CM | POA: Diagnosis not present

## 2023-04-01 DIAGNOSIS — Z992 Dependence on renal dialysis: Secondary | ICD-10-CM | POA: Diagnosis not present

## 2023-04-01 DIAGNOSIS — D631 Anemia in chronic kidney disease: Secondary | ICD-10-CM | POA: Diagnosis not present

## 2023-04-01 DIAGNOSIS — D509 Iron deficiency anemia, unspecified: Secondary | ICD-10-CM | POA: Diagnosis not present

## 2023-04-01 DIAGNOSIS — N186 End stage renal disease: Secondary | ICD-10-CM | POA: Diagnosis not present

## 2023-04-01 NOTE — Telephone Encounter (Signed)
 Fax received from GI x 2. Pre op note faxed to PCP per Pre Op because PCP follows and manages Eliquis.

## 2023-04-02 DIAGNOSIS — I509 Heart failure, unspecified: Secondary | ICD-10-CM | POA: Diagnosis not present

## 2023-04-02 DIAGNOSIS — E1122 Type 2 diabetes mellitus with diabetic chronic kidney disease: Secondary | ICD-10-CM | POA: Diagnosis not present

## 2023-04-02 DIAGNOSIS — N186 End stage renal disease: Secondary | ICD-10-CM | POA: Diagnosis not present

## 2023-04-02 DIAGNOSIS — K7460 Unspecified cirrhosis of liver: Secondary | ICD-10-CM | POA: Diagnosis not present

## 2023-04-02 DIAGNOSIS — I132 Hypertensive heart and chronic kidney disease with heart failure and with stage 5 chronic kidney disease, or end stage renal disease: Secondary | ICD-10-CM | POA: Diagnosis not present

## 2023-04-02 DIAGNOSIS — Z48815 Encounter for surgical aftercare following surgery on the digestive system: Secondary | ICD-10-CM | POA: Diagnosis not present

## 2023-04-03 DIAGNOSIS — N186 End stage renal disease: Secondary | ICD-10-CM | POA: Diagnosis not present

## 2023-04-03 DIAGNOSIS — D631 Anemia in chronic kidney disease: Secondary | ICD-10-CM | POA: Diagnosis not present

## 2023-04-03 DIAGNOSIS — D509 Iron deficiency anemia, unspecified: Secondary | ICD-10-CM | POA: Diagnosis not present

## 2023-04-03 DIAGNOSIS — N2581 Secondary hyperparathyroidism of renal origin: Secondary | ICD-10-CM | POA: Diagnosis not present

## 2023-04-03 DIAGNOSIS — Z992 Dependence on renal dialysis: Secondary | ICD-10-CM | POA: Diagnosis not present

## 2023-04-03 DIAGNOSIS — Z23 Encounter for immunization: Secondary | ICD-10-CM | POA: Diagnosis not present

## 2023-04-04 DIAGNOSIS — I132 Hypertensive heart and chronic kidney disease with heart failure and with stage 5 chronic kidney disease, or end stage renal disease: Secondary | ICD-10-CM | POA: Diagnosis not present

## 2023-04-04 DIAGNOSIS — K7460 Unspecified cirrhosis of liver: Secondary | ICD-10-CM | POA: Diagnosis not present

## 2023-04-04 DIAGNOSIS — E1122 Type 2 diabetes mellitus with diabetic chronic kidney disease: Secondary | ICD-10-CM | POA: Diagnosis not present

## 2023-04-04 DIAGNOSIS — Z48815 Encounter for surgical aftercare following surgery on the digestive system: Secondary | ICD-10-CM | POA: Diagnosis not present

## 2023-04-04 DIAGNOSIS — N186 End stage renal disease: Secondary | ICD-10-CM | POA: Diagnosis not present

## 2023-04-04 DIAGNOSIS — I509 Heart failure, unspecified: Secondary | ICD-10-CM | POA: Diagnosis not present

## 2023-04-05 DIAGNOSIS — Z992 Dependence on renal dialysis: Secondary | ICD-10-CM | POA: Diagnosis not present

## 2023-04-05 DIAGNOSIS — D509 Iron deficiency anemia, unspecified: Secondary | ICD-10-CM | POA: Diagnosis not present

## 2023-04-05 DIAGNOSIS — Z23 Encounter for immunization: Secondary | ICD-10-CM | POA: Diagnosis not present

## 2023-04-05 DIAGNOSIS — N2581 Secondary hyperparathyroidism of renal origin: Secondary | ICD-10-CM | POA: Diagnosis not present

## 2023-04-05 DIAGNOSIS — D631 Anemia in chronic kidney disease: Secondary | ICD-10-CM | POA: Diagnosis not present

## 2023-04-05 DIAGNOSIS — N186 End stage renal disease: Secondary | ICD-10-CM | POA: Diagnosis not present

## 2023-04-08 DIAGNOSIS — Z992 Dependence on renal dialysis: Secondary | ICD-10-CM | POA: Diagnosis not present

## 2023-04-08 DIAGNOSIS — Z23 Encounter for immunization: Secondary | ICD-10-CM | POA: Diagnosis not present

## 2023-04-08 DIAGNOSIS — D631 Anemia in chronic kidney disease: Secondary | ICD-10-CM | POA: Diagnosis not present

## 2023-04-08 DIAGNOSIS — N186 End stage renal disease: Secondary | ICD-10-CM | POA: Diagnosis not present

## 2023-04-08 DIAGNOSIS — D509 Iron deficiency anemia, unspecified: Secondary | ICD-10-CM | POA: Diagnosis not present

## 2023-04-08 DIAGNOSIS — N2581 Secondary hyperparathyroidism of renal origin: Secondary | ICD-10-CM | POA: Diagnosis not present

## 2023-04-09 DIAGNOSIS — N186 End stage renal disease: Secondary | ICD-10-CM | POA: Diagnosis not present

## 2023-04-09 DIAGNOSIS — K7460 Unspecified cirrhosis of liver: Secondary | ICD-10-CM | POA: Diagnosis not present

## 2023-04-09 DIAGNOSIS — E1122 Type 2 diabetes mellitus with diabetic chronic kidney disease: Secondary | ICD-10-CM | POA: Diagnosis not present

## 2023-04-09 DIAGNOSIS — I509 Heart failure, unspecified: Secondary | ICD-10-CM | POA: Diagnosis not present

## 2023-04-09 DIAGNOSIS — I132 Hypertensive heart and chronic kidney disease with heart failure and with stage 5 chronic kidney disease, or end stage renal disease: Secondary | ICD-10-CM | POA: Diagnosis not present

## 2023-04-09 DIAGNOSIS — Z48815 Encounter for surgical aftercare following surgery on the digestive system: Secondary | ICD-10-CM | POA: Diagnosis not present

## 2023-04-10 DIAGNOSIS — Z7901 Long term (current) use of anticoagulants: Secondary | ICD-10-CM | POA: Diagnosis not present

## 2023-04-10 DIAGNOSIS — Z79899 Other long term (current) drug therapy: Secondary | ICD-10-CM | POA: Diagnosis not present

## 2023-04-10 DIAGNOSIS — Z794 Long term (current) use of insulin: Secondary | ICD-10-CM | POA: Diagnosis not present

## 2023-04-10 DIAGNOSIS — Z880 Allergy status to penicillin: Secondary | ICD-10-CM | POA: Diagnosis not present

## 2023-04-10 DIAGNOSIS — I517 Cardiomegaly: Secondary | ICD-10-CM | POA: Diagnosis not present

## 2023-04-10 DIAGNOSIS — Z8673 Personal history of transient ischemic attack (TIA), and cerebral infarction without residual deficits: Secondary | ICD-10-CM | POA: Diagnosis not present

## 2023-04-10 DIAGNOSIS — D631 Anemia in chronic kidney disease: Secondary | ICD-10-CM | POA: Diagnosis not present

## 2023-04-10 DIAGNOSIS — I4510 Unspecified right bundle-branch block: Secondary | ICD-10-CM | POA: Diagnosis not present

## 2023-04-10 DIAGNOSIS — Z992 Dependence on renal dialysis: Secondary | ICD-10-CM | POA: Diagnosis not present

## 2023-04-10 DIAGNOSIS — B958 Unspecified staphylococcus as the cause of diseases classified elsewhere: Secondary | ICD-10-CM | POA: Diagnosis not present

## 2023-04-10 DIAGNOSIS — A408 Other streptococcal sepsis: Secondary | ICD-10-CM | POA: Diagnosis not present

## 2023-04-10 DIAGNOSIS — I120 Hypertensive chronic kidney disease with stage 5 chronic kidney disease or end stage renal disease: Secondary | ICD-10-CM | POA: Diagnosis not present

## 2023-04-10 DIAGNOSIS — Z23 Encounter for immunization: Secondary | ICD-10-CM | POA: Diagnosis not present

## 2023-04-10 DIAGNOSIS — I674 Hypertensive encephalopathy: Secondary | ICD-10-CM | POA: Diagnosis not present

## 2023-04-10 DIAGNOSIS — E1122 Type 2 diabetes mellitus with diabetic chronic kidney disease: Secondary | ICD-10-CM | POA: Diagnosis not present

## 2023-04-10 DIAGNOSIS — I959 Hypotension, unspecified: Secondary | ICD-10-CM | POA: Diagnosis not present

## 2023-04-10 DIAGNOSIS — D509 Iron deficiency anemia, unspecified: Secondary | ICD-10-CM | POA: Diagnosis not present

## 2023-04-10 DIAGNOSIS — G9341 Metabolic encephalopathy: Secondary | ICD-10-CM | POA: Diagnosis not present

## 2023-04-10 DIAGNOSIS — T827XXA Infection and inflammatory reaction due to other cardiac and vascular devices, implants and grafts, initial encounter: Secondary | ICD-10-CM | POA: Diagnosis not present

## 2023-04-10 DIAGNOSIS — I10 Essential (primary) hypertension: Secondary | ICD-10-CM | POA: Diagnosis not present

## 2023-04-10 DIAGNOSIS — R4182 Altered mental status, unspecified: Secondary | ICD-10-CM | POA: Diagnosis not present

## 2023-04-10 DIAGNOSIS — I161 Hypertensive emergency: Secondary | ICD-10-CM | POA: Diagnosis not present

## 2023-04-10 DIAGNOSIS — R9431 Abnormal electrocardiogram [ECG] [EKG]: Secondary | ICD-10-CM | POA: Diagnosis not present

## 2023-04-10 DIAGNOSIS — I808 Phlebitis and thrombophlebitis of other sites: Secondary | ICD-10-CM | POA: Diagnosis not present

## 2023-04-10 DIAGNOSIS — N2581 Secondary hyperparathyroidism of renal origin: Secondary | ICD-10-CM | POA: Diagnosis not present

## 2023-04-10 DIAGNOSIS — R7881 Bacteremia: Secondary | ICD-10-CM | POA: Diagnosis not present

## 2023-04-10 DIAGNOSIS — E875 Hyperkalemia: Secondary | ICD-10-CM | POA: Diagnosis not present

## 2023-04-10 DIAGNOSIS — N186 End stage renal disease: Secondary | ICD-10-CM | POA: Diagnosis not present

## 2023-04-10 DIAGNOSIS — Z1152 Encounter for screening for COVID-19: Secondary | ICD-10-CM | POA: Diagnosis not present

## 2023-04-10 DIAGNOSIS — T80211A Bloodstream infection due to central venous catheter, initial encounter: Secondary | ICD-10-CM | POA: Diagnosis not present

## 2023-04-10 DIAGNOSIS — Z993 Dependence on wheelchair: Secondary | ICD-10-CM | POA: Diagnosis not present

## 2023-04-10 DIAGNOSIS — R519 Headache, unspecified: Secondary | ICD-10-CM | POA: Diagnosis not present

## 2023-04-13 DIAGNOSIS — I517 Cardiomegaly: Secondary | ICD-10-CM

## 2023-04-17 DIAGNOSIS — N186 End stage renal disease: Secondary | ICD-10-CM | POA: Diagnosis not present

## 2023-04-17 DIAGNOSIS — Z23 Encounter for immunization: Secondary | ICD-10-CM | POA: Diagnosis not present

## 2023-04-17 DIAGNOSIS — D509 Iron deficiency anemia, unspecified: Secondary | ICD-10-CM | POA: Diagnosis not present

## 2023-04-17 DIAGNOSIS — D631 Anemia in chronic kidney disease: Secondary | ICD-10-CM | POA: Diagnosis not present

## 2023-04-17 DIAGNOSIS — N2581 Secondary hyperparathyroidism of renal origin: Secondary | ICD-10-CM | POA: Diagnosis not present

## 2023-04-17 DIAGNOSIS — Z992 Dependence on renal dialysis: Secondary | ICD-10-CM | POA: Diagnosis not present

## 2023-04-18 DIAGNOSIS — K7460 Unspecified cirrhosis of liver: Secondary | ICD-10-CM | POA: Diagnosis not present

## 2023-04-18 DIAGNOSIS — N186 End stage renal disease: Secondary | ICD-10-CM | POA: Diagnosis not present

## 2023-04-18 DIAGNOSIS — E1122 Type 2 diabetes mellitus with diabetic chronic kidney disease: Secondary | ICD-10-CM | POA: Diagnosis not present

## 2023-04-18 DIAGNOSIS — I509 Heart failure, unspecified: Secondary | ICD-10-CM | POA: Diagnosis not present

## 2023-04-18 DIAGNOSIS — I132 Hypertensive heart and chronic kidney disease with heart failure and with stage 5 chronic kidney disease, or end stage renal disease: Secondary | ICD-10-CM | POA: Diagnosis not present

## 2023-04-18 DIAGNOSIS — Z48815 Encounter for surgical aftercare following surgery on the digestive system: Secondary | ICD-10-CM | POA: Diagnosis not present

## 2023-04-19 DIAGNOSIS — Z992 Dependence on renal dialysis: Secondary | ICD-10-CM | POA: Diagnosis not present

## 2023-04-19 DIAGNOSIS — D631 Anemia in chronic kidney disease: Secondary | ICD-10-CM | POA: Diagnosis not present

## 2023-04-19 DIAGNOSIS — N186 End stage renal disease: Secondary | ICD-10-CM | POA: Diagnosis not present

## 2023-04-19 DIAGNOSIS — N2581 Secondary hyperparathyroidism of renal origin: Secondary | ICD-10-CM | POA: Diagnosis not present

## 2023-04-19 DIAGNOSIS — D509 Iron deficiency anemia, unspecified: Secondary | ICD-10-CM | POA: Diagnosis not present

## 2023-04-19 DIAGNOSIS — Z23 Encounter for immunization: Secondary | ICD-10-CM | POA: Diagnosis not present

## 2023-04-22 DIAGNOSIS — N186 End stage renal disease: Secondary | ICD-10-CM | POA: Diagnosis not present

## 2023-04-22 DIAGNOSIS — N2581 Secondary hyperparathyroidism of renal origin: Secondary | ICD-10-CM | POA: Diagnosis not present

## 2023-04-22 DIAGNOSIS — I509 Heart failure, unspecified: Secondary | ICD-10-CM | POA: Diagnosis not present

## 2023-04-22 DIAGNOSIS — E1122 Type 2 diabetes mellitus with diabetic chronic kidney disease: Secondary | ICD-10-CM | POA: Diagnosis not present

## 2023-04-22 DIAGNOSIS — D509 Iron deficiency anemia, unspecified: Secondary | ICD-10-CM | POA: Diagnosis not present

## 2023-04-22 DIAGNOSIS — D631 Anemia in chronic kidney disease: Secondary | ICD-10-CM | POA: Diagnosis not present

## 2023-04-22 DIAGNOSIS — I132 Hypertensive heart and chronic kidney disease with heart failure and with stage 5 chronic kidney disease, or end stage renal disease: Secondary | ICD-10-CM | POA: Diagnosis not present

## 2023-04-22 DIAGNOSIS — Z23 Encounter for immunization: Secondary | ICD-10-CM | POA: Diagnosis not present

## 2023-04-22 DIAGNOSIS — K7460 Unspecified cirrhosis of liver: Secondary | ICD-10-CM | POA: Diagnosis not present

## 2023-04-22 DIAGNOSIS — Z992 Dependence on renal dialysis: Secondary | ICD-10-CM | POA: Diagnosis not present

## 2023-04-22 DIAGNOSIS — Z48815 Encounter for surgical aftercare following surgery on the digestive system: Secondary | ICD-10-CM | POA: Diagnosis not present

## 2023-04-24 DIAGNOSIS — I509 Heart failure, unspecified: Secondary | ICD-10-CM | POA: Diagnosis not present

## 2023-04-24 DIAGNOSIS — N186 End stage renal disease: Secondary | ICD-10-CM | POA: Diagnosis not present

## 2023-04-24 DIAGNOSIS — K7460 Unspecified cirrhosis of liver: Secondary | ICD-10-CM | POA: Diagnosis not present

## 2023-04-24 DIAGNOSIS — E1122 Type 2 diabetes mellitus with diabetic chronic kidney disease: Secondary | ICD-10-CM | POA: Diagnosis not present

## 2023-04-24 DIAGNOSIS — Z48815 Encounter for surgical aftercare following surgery on the digestive system: Secondary | ICD-10-CM | POA: Diagnosis not present

## 2023-04-24 DIAGNOSIS — Z992 Dependence on renal dialysis: Secondary | ICD-10-CM | POA: Diagnosis not present

## 2023-04-24 DIAGNOSIS — N2581 Secondary hyperparathyroidism of renal origin: Secondary | ICD-10-CM | POA: Diagnosis not present

## 2023-04-24 DIAGNOSIS — Z23 Encounter for immunization: Secondary | ICD-10-CM | POA: Diagnosis not present

## 2023-04-24 DIAGNOSIS — D631 Anemia in chronic kidney disease: Secondary | ICD-10-CM | POA: Diagnosis not present

## 2023-04-24 DIAGNOSIS — I132 Hypertensive heart and chronic kidney disease with heart failure and with stage 5 chronic kidney disease, or end stage renal disease: Secondary | ICD-10-CM | POA: Diagnosis not present

## 2023-04-24 DIAGNOSIS — D509 Iron deficiency anemia, unspecified: Secondary | ICD-10-CM | POA: Diagnosis not present

## 2023-04-25 DIAGNOSIS — I132 Hypertensive heart and chronic kidney disease with heart failure and with stage 5 chronic kidney disease, or end stage renal disease: Secondary | ICD-10-CM | POA: Diagnosis not present

## 2023-04-25 DIAGNOSIS — E1122 Type 2 diabetes mellitus with diabetic chronic kidney disease: Secondary | ICD-10-CM | POA: Diagnosis not present

## 2023-04-25 DIAGNOSIS — K7460 Unspecified cirrhosis of liver: Secondary | ICD-10-CM | POA: Diagnosis not present

## 2023-04-25 DIAGNOSIS — N186 End stage renal disease: Secondary | ICD-10-CM | POA: Diagnosis not present

## 2023-04-25 DIAGNOSIS — Z48815 Encounter for surgical aftercare following surgery on the digestive system: Secondary | ICD-10-CM | POA: Diagnosis not present

## 2023-04-25 DIAGNOSIS — I509 Heart failure, unspecified: Secondary | ICD-10-CM | POA: Diagnosis not present

## 2023-04-26 DIAGNOSIS — D509 Iron deficiency anemia, unspecified: Secondary | ICD-10-CM | POA: Diagnosis not present

## 2023-04-26 DIAGNOSIS — Z23 Encounter for immunization: Secondary | ICD-10-CM | POA: Diagnosis not present

## 2023-04-26 DIAGNOSIS — N186 End stage renal disease: Secondary | ICD-10-CM | POA: Diagnosis not present

## 2023-04-26 DIAGNOSIS — Z992 Dependence on renal dialysis: Secondary | ICD-10-CM | POA: Diagnosis not present

## 2023-04-26 DIAGNOSIS — D631 Anemia in chronic kidney disease: Secondary | ICD-10-CM | POA: Diagnosis not present

## 2023-04-26 DIAGNOSIS — N2581 Secondary hyperparathyroidism of renal origin: Secondary | ICD-10-CM | POA: Diagnosis not present

## 2023-04-27 DIAGNOSIS — Z992 Dependence on renal dialysis: Secondary | ICD-10-CM | POA: Diagnosis not present

## 2023-04-27 DIAGNOSIS — E1122 Type 2 diabetes mellitus with diabetic chronic kidney disease: Secondary | ICD-10-CM | POA: Diagnosis not present

## 2023-04-27 DIAGNOSIS — N186 End stage renal disease: Secondary | ICD-10-CM | POA: Diagnosis not present

## 2023-04-29 DIAGNOSIS — N186 End stage renal disease: Secondary | ICD-10-CM | POA: Diagnosis not present

## 2023-04-29 DIAGNOSIS — E039 Hypothyroidism, unspecified: Secondary | ICD-10-CM | POA: Diagnosis not present

## 2023-04-29 DIAGNOSIS — K5790 Diverticulosis of intestine, part unspecified, without perforation or abscess without bleeding: Secondary | ICD-10-CM | POA: Diagnosis not present

## 2023-04-29 DIAGNOSIS — I132 Hypertensive heart and chronic kidney disease with heart failure and with stage 5 chronic kidney disease, or end stage renal disease: Secondary | ICD-10-CM | POA: Diagnosis not present

## 2023-04-29 DIAGNOSIS — D631 Anemia in chronic kidney disease: Secondary | ICD-10-CM | POA: Diagnosis not present

## 2023-04-29 DIAGNOSIS — M21371 Foot drop, right foot: Secondary | ICD-10-CM | POA: Diagnosis not present

## 2023-04-29 DIAGNOSIS — D509 Iron deficiency anemia, unspecified: Secondary | ICD-10-CM | POA: Diagnosis not present

## 2023-04-29 DIAGNOSIS — Z48815 Encounter for surgical aftercare following surgery on the digestive system: Secondary | ICD-10-CM | POA: Diagnosis not present

## 2023-04-29 DIAGNOSIS — K7460 Unspecified cirrhosis of liver: Secondary | ICD-10-CM | POA: Diagnosis not present

## 2023-04-29 DIAGNOSIS — F419 Anxiety disorder, unspecified: Secondary | ICD-10-CM | POA: Diagnosis not present

## 2023-04-29 DIAGNOSIS — Z992 Dependence on renal dialysis: Secondary | ICD-10-CM | POA: Diagnosis not present

## 2023-04-29 DIAGNOSIS — K2270 Barrett's esophagus without dysplasia: Secondary | ICD-10-CM | POA: Diagnosis not present

## 2023-04-29 DIAGNOSIS — E1142 Type 2 diabetes mellitus with diabetic polyneuropathy: Secondary | ICD-10-CM | POA: Diagnosis not present

## 2023-04-29 DIAGNOSIS — M5416 Radiculopathy, lumbar region: Secondary | ICD-10-CM | POA: Diagnosis not present

## 2023-04-29 DIAGNOSIS — I69398 Other sequelae of cerebral infarction: Secondary | ICD-10-CM | POA: Diagnosis not present

## 2023-04-29 DIAGNOSIS — E1122 Type 2 diabetes mellitus with diabetic chronic kidney disease: Secondary | ICD-10-CM | POA: Diagnosis not present

## 2023-04-29 DIAGNOSIS — F32A Depression, unspecified: Secondary | ICD-10-CM | POA: Diagnosis not present

## 2023-04-29 DIAGNOSIS — E785 Hyperlipidemia, unspecified: Secondary | ICD-10-CM | POA: Diagnosis not present

## 2023-04-29 DIAGNOSIS — I4510 Unspecified right bundle-branch block: Secondary | ICD-10-CM | POA: Diagnosis not present

## 2023-04-29 DIAGNOSIS — N2581 Secondary hyperparathyroidism of renal origin: Secondary | ICD-10-CM | POA: Diagnosis not present

## 2023-04-29 DIAGNOSIS — Z7901 Long term (current) use of anticoagulants: Secondary | ICD-10-CM | POA: Diagnosis not present

## 2023-04-29 DIAGNOSIS — I509 Heart failure, unspecified: Secondary | ICD-10-CM | POA: Diagnosis not present

## 2023-04-30 DIAGNOSIS — N186 End stage renal disease: Secondary | ICD-10-CM | POA: Diagnosis not present

## 2023-04-30 DIAGNOSIS — M79604 Pain in right leg: Secondary | ICD-10-CM | POA: Diagnosis not present

## 2023-04-30 DIAGNOSIS — R9431 Abnormal electrocardiogram [ECG] [EKG]: Secondary | ICD-10-CM | POA: Diagnosis not present

## 2023-04-30 DIAGNOSIS — S72144A Nondisplaced intertrochanteric fracture of right femur, initial encounter for closed fracture: Secondary | ICD-10-CM | POA: Diagnosis not present

## 2023-04-30 DIAGNOSIS — K7460 Unspecified cirrhosis of liver: Secondary | ICD-10-CM | POA: Diagnosis not present

## 2023-04-30 DIAGNOSIS — W19XXXA Unspecified fall, initial encounter: Secondary | ICD-10-CM | POA: Diagnosis not present

## 2023-04-30 DIAGNOSIS — I509 Heart failure, unspecified: Secondary | ICD-10-CM | POA: Diagnosis not present

## 2023-04-30 DIAGNOSIS — E1122 Type 2 diabetes mellitus with diabetic chronic kidney disease: Secondary | ICD-10-CM | POA: Diagnosis not present

## 2023-04-30 DIAGNOSIS — I69398 Other sequelae of cerebral infarction: Secondary | ICD-10-CM | POA: Diagnosis not present

## 2023-04-30 DIAGNOSIS — Z48815 Encounter for surgical aftercare following surgery on the digestive system: Secondary | ICD-10-CM | POA: Diagnosis not present

## 2023-04-30 DIAGNOSIS — S8991XA Unspecified injury of right lower leg, initial encounter: Secondary | ICD-10-CM | POA: Diagnosis not present

## 2023-04-30 DIAGNOSIS — Z992 Dependence on renal dialysis: Secondary | ICD-10-CM | POA: Diagnosis not present

## 2023-04-30 DIAGNOSIS — I132 Hypertensive heart and chronic kidney disease with heart failure and with stage 5 chronic kidney disease, or end stage renal disease: Secondary | ICD-10-CM | POA: Diagnosis not present

## 2023-04-30 DIAGNOSIS — W06XXXA Fall from bed, initial encounter: Secondary | ICD-10-CM | POA: Diagnosis not present

## 2023-05-01 DIAGNOSIS — R279 Unspecified lack of coordination: Secondary | ICD-10-CM | POA: Diagnosis not present

## 2023-05-01 DIAGNOSIS — Z880 Allergy status to penicillin: Secondary | ICD-10-CM | POA: Diagnosis not present

## 2023-05-01 DIAGNOSIS — N186 End stage renal disease: Secondary | ICD-10-CM | POA: Diagnosis not present

## 2023-05-01 DIAGNOSIS — S72144A Nondisplaced intertrochanteric fracture of right femur, initial encounter for closed fracture: Secondary | ICD-10-CM | POA: Diagnosis not present

## 2023-05-01 DIAGNOSIS — K5900 Constipation, unspecified: Secondary | ICD-10-CM | POA: Diagnosis not present

## 2023-05-01 DIAGNOSIS — W19XXXA Unspecified fall, initial encounter: Secondary | ICD-10-CM | POA: Diagnosis not present

## 2023-05-01 DIAGNOSIS — Z992 Dependence on renal dialysis: Secondary | ICD-10-CM | POA: Diagnosis not present

## 2023-05-01 DIAGNOSIS — I120 Hypertensive chronic kidney disease with stage 5 chronic kidney disease or end stage renal disease: Secondary | ICD-10-CM | POA: Diagnosis not present

## 2023-05-01 DIAGNOSIS — F05 Delirium due to known physiological condition: Secondary | ICD-10-CM | POA: Diagnosis not present

## 2023-05-01 DIAGNOSIS — R451 Restlessness and agitation: Secondary | ICD-10-CM | POA: Diagnosis not present

## 2023-05-01 DIAGNOSIS — Z7401 Bed confinement status: Secondary | ICD-10-CM | POA: Diagnosis not present

## 2023-05-01 DIAGNOSIS — E875 Hyperkalemia: Secondary | ICD-10-CM | POA: Diagnosis not present

## 2023-05-01 DIAGNOSIS — Z8781 Personal history of (healed) traumatic fracture: Secondary | ICD-10-CM | POA: Diagnosis not present

## 2023-05-01 DIAGNOSIS — S72144D Nondisplaced intertrochanteric fracture of right femur, subsequent encounter for closed fracture with routine healing: Secondary | ICD-10-CM | POA: Diagnosis not present

## 2023-05-01 DIAGNOSIS — R5381 Other malaise: Secondary | ICD-10-CM | POA: Diagnosis not present

## 2023-05-01 DIAGNOSIS — E782 Mixed hyperlipidemia: Secondary | ICD-10-CM | POA: Diagnosis not present

## 2023-05-01 DIAGNOSIS — M25551 Pain in right hip: Secondary | ICD-10-CM | POA: Diagnosis not present

## 2023-05-01 DIAGNOSIS — Z8673 Personal history of transient ischemic attack (TIA), and cerebral infarction without residual deficits: Secondary | ICD-10-CM | POA: Diagnosis not present

## 2023-05-01 DIAGNOSIS — M6259 Muscle wasting and atrophy, not elsewhere classified, multiple sites: Secondary | ICD-10-CM | POA: Diagnosis not present

## 2023-05-01 DIAGNOSIS — E1129 Type 2 diabetes mellitus with other diabetic kidney complication: Secondary | ICD-10-CM | POA: Diagnosis not present

## 2023-05-01 DIAGNOSIS — I69391 Dysphagia following cerebral infarction: Secondary | ICD-10-CM | POA: Diagnosis not present

## 2023-05-01 DIAGNOSIS — E871 Hypo-osmolality and hyponatremia: Secondary | ICD-10-CM | POA: Diagnosis not present

## 2023-05-01 DIAGNOSIS — B999 Unspecified infectious disease: Secondary | ICD-10-CM | POA: Diagnosis not present

## 2023-05-01 DIAGNOSIS — E1122 Type 2 diabetes mellitus with diabetic chronic kidney disease: Secondary | ICD-10-CM | POA: Diagnosis not present

## 2023-05-01 DIAGNOSIS — Z4789 Encounter for other orthopedic aftercare: Secondary | ICD-10-CM | POA: Diagnosis not present

## 2023-05-01 DIAGNOSIS — I639 Cerebral infarction, unspecified: Secondary | ICD-10-CM | POA: Diagnosis not present

## 2023-05-01 DIAGNOSIS — I10 Essential (primary) hypertension: Secondary | ICD-10-CM | POA: Diagnosis not present

## 2023-05-01 DIAGNOSIS — E039 Hypothyroidism, unspecified: Secondary | ICD-10-CM | POA: Diagnosis not present

## 2023-05-01 DIAGNOSIS — W06XXXA Fall from bed, initial encounter: Secondary | ICD-10-CM | POA: Diagnosis not present

## 2023-05-01 DIAGNOSIS — K219 Gastro-esophageal reflux disease without esophagitis: Secondary | ICD-10-CM | POA: Diagnosis present

## 2023-05-01 DIAGNOSIS — K7460 Unspecified cirrhosis of liver: Secondary | ICD-10-CM | POA: Diagnosis not present

## 2023-05-01 DIAGNOSIS — E1169 Type 2 diabetes mellitus with other specified complication: Secondary | ICD-10-CM | POA: Diagnosis not present

## 2023-05-01 DIAGNOSIS — R9431 Abnormal electrocardiogram [ECG] [EKG]: Secondary | ICD-10-CM | POA: Diagnosis not present

## 2023-05-01 DIAGNOSIS — G9340 Encephalopathy, unspecified: Secondary | ICD-10-CM | POA: Diagnosis not present

## 2023-05-01 DIAGNOSIS — S72141D Displaced intertrochanteric fracture of right femur, subsequent encounter for closed fracture with routine healing: Secondary | ICD-10-CM | POA: Diagnosis not present

## 2023-05-01 DIAGNOSIS — Z79899 Other long term (current) drug therapy: Secondary | ICD-10-CM | POA: Diagnosis not present

## 2023-05-01 DIAGNOSIS — Z789 Other specified health status: Secondary | ICD-10-CM | POA: Diagnosis not present

## 2023-05-01 DIAGNOSIS — S72141A Displaced intertrochanteric fracture of right femur, initial encounter for closed fracture: Secondary | ICD-10-CM | POA: Diagnosis not present

## 2023-05-01 DIAGNOSIS — Z7901 Long term (current) use of anticoagulants: Secondary | ICD-10-CM | POA: Diagnosis not present

## 2023-05-02 DIAGNOSIS — N186 End stage renal disease: Secondary | ICD-10-CM | POA: Diagnosis not present

## 2023-05-02 DIAGNOSIS — E875 Hyperkalemia: Secondary | ICD-10-CM | POA: Diagnosis not present

## 2023-05-02 DIAGNOSIS — I120 Hypertensive chronic kidney disease with stage 5 chronic kidney disease or end stage renal disease: Secondary | ICD-10-CM | POA: Diagnosis not present

## 2023-05-02 DIAGNOSIS — S72144A Nondisplaced intertrochanteric fracture of right femur, initial encounter for closed fracture: Secondary | ICD-10-CM | POA: Diagnosis not present

## 2023-05-02 DIAGNOSIS — E871 Hypo-osmolality and hyponatremia: Secondary | ICD-10-CM | POA: Diagnosis not present

## 2023-05-03 DIAGNOSIS — I120 Hypertensive chronic kidney disease with stage 5 chronic kidney disease or end stage renal disease: Secondary | ICD-10-CM | POA: Diagnosis not present

## 2023-05-03 DIAGNOSIS — N186 End stage renal disease: Secondary | ICD-10-CM | POA: Diagnosis not present

## 2023-05-03 DIAGNOSIS — S72144A Nondisplaced intertrochanteric fracture of right femur, initial encounter for closed fracture: Secondary | ICD-10-CM | POA: Diagnosis not present

## 2023-05-03 DIAGNOSIS — E875 Hyperkalemia: Secondary | ICD-10-CM | POA: Diagnosis not present

## 2023-05-03 DIAGNOSIS — E871 Hypo-osmolality and hyponatremia: Secondary | ICD-10-CM | POA: Diagnosis not present

## 2023-05-04 DIAGNOSIS — E875 Hyperkalemia: Secondary | ICD-10-CM | POA: Diagnosis not present

## 2023-05-04 DIAGNOSIS — S72144A Nondisplaced intertrochanteric fracture of right femur, initial encounter for closed fracture: Secondary | ICD-10-CM | POA: Diagnosis not present

## 2023-05-04 DIAGNOSIS — E871 Hypo-osmolality and hyponatremia: Secondary | ICD-10-CM | POA: Diagnosis not present

## 2023-05-04 DIAGNOSIS — N186 End stage renal disease: Secondary | ICD-10-CM | POA: Diagnosis not present

## 2023-05-04 DIAGNOSIS — I120 Hypertensive chronic kidney disease with stage 5 chronic kidney disease or end stage renal disease: Secondary | ICD-10-CM | POA: Diagnosis not present

## 2023-05-05 DIAGNOSIS — E871 Hypo-osmolality and hyponatremia: Secondary | ICD-10-CM | POA: Diagnosis not present

## 2023-05-05 DIAGNOSIS — S72144A Nondisplaced intertrochanteric fracture of right femur, initial encounter for closed fracture: Secondary | ICD-10-CM | POA: Diagnosis not present

## 2023-05-05 DIAGNOSIS — N186 End stage renal disease: Secondary | ICD-10-CM | POA: Diagnosis not present

## 2023-05-05 DIAGNOSIS — I120 Hypertensive chronic kidney disease with stage 5 chronic kidney disease or end stage renal disease: Secondary | ICD-10-CM | POA: Diagnosis not present

## 2023-05-05 DIAGNOSIS — E875 Hyperkalemia: Secondary | ICD-10-CM | POA: Diagnosis not present

## 2023-05-06 DIAGNOSIS — E875 Hyperkalemia: Secondary | ICD-10-CM | POA: Diagnosis not present

## 2023-05-06 DIAGNOSIS — S72144A Nondisplaced intertrochanteric fracture of right femur, initial encounter for closed fracture: Secondary | ICD-10-CM | POA: Diagnosis not present

## 2023-05-06 DIAGNOSIS — I120 Hypertensive chronic kidney disease with stage 5 chronic kidney disease or end stage renal disease: Secondary | ICD-10-CM | POA: Diagnosis not present

## 2023-05-06 DIAGNOSIS — N186 End stage renal disease: Secondary | ICD-10-CM | POA: Diagnosis not present

## 2023-05-06 DIAGNOSIS — E871 Hypo-osmolality and hyponatremia: Secondary | ICD-10-CM | POA: Diagnosis not present

## 2023-05-07 DIAGNOSIS — I120 Hypertensive chronic kidney disease with stage 5 chronic kidney disease or end stage renal disease: Secondary | ICD-10-CM | POA: Diagnosis not present

## 2023-05-07 DIAGNOSIS — S72144A Nondisplaced intertrochanteric fracture of right femur, initial encounter for closed fracture: Secondary | ICD-10-CM | POA: Diagnosis not present

## 2023-05-07 DIAGNOSIS — E875 Hyperkalemia: Secondary | ICD-10-CM | POA: Diagnosis not present

## 2023-05-07 DIAGNOSIS — N186 End stage renal disease: Secondary | ICD-10-CM | POA: Diagnosis not present

## 2023-05-07 DIAGNOSIS — E871 Hypo-osmolality and hyponatremia: Secondary | ICD-10-CM | POA: Diagnosis not present

## 2023-05-08 DIAGNOSIS — E875 Hyperkalemia: Secondary | ICD-10-CM | POA: Diagnosis not present

## 2023-05-08 DIAGNOSIS — E871 Hypo-osmolality and hyponatremia: Secondary | ICD-10-CM | POA: Diagnosis not present

## 2023-05-08 DIAGNOSIS — S72144A Nondisplaced intertrochanteric fracture of right femur, initial encounter for closed fracture: Secondary | ICD-10-CM | POA: Diagnosis not present

## 2023-05-08 DIAGNOSIS — N186 End stage renal disease: Secondary | ICD-10-CM | POA: Diagnosis not present

## 2023-05-08 DIAGNOSIS — I120 Hypertensive chronic kidney disease with stage 5 chronic kidney disease or end stage renal disease: Secondary | ICD-10-CM | POA: Diagnosis not present

## 2023-05-09 DIAGNOSIS — E875 Hyperkalemia: Secondary | ICD-10-CM | POA: Diagnosis not present

## 2023-05-09 DIAGNOSIS — S72144A Nondisplaced intertrochanteric fracture of right femur, initial encounter for closed fracture: Secondary | ICD-10-CM | POA: Diagnosis not present

## 2023-05-09 DIAGNOSIS — N186 End stage renal disease: Secondary | ICD-10-CM | POA: Diagnosis not present

## 2023-05-09 DIAGNOSIS — E871 Hypo-osmolality and hyponatremia: Secondary | ICD-10-CM | POA: Diagnosis not present

## 2023-05-09 DIAGNOSIS — I120 Hypertensive chronic kidney disease with stage 5 chronic kidney disease or end stage renal disease: Secondary | ICD-10-CM | POA: Diagnosis not present

## 2023-05-10 DIAGNOSIS — E871 Hypo-osmolality and hyponatremia: Secondary | ICD-10-CM | POA: Diagnosis not present

## 2023-05-10 DIAGNOSIS — S72144A Nondisplaced intertrochanteric fracture of right femur, initial encounter for closed fracture: Secondary | ICD-10-CM | POA: Diagnosis not present

## 2023-05-10 DIAGNOSIS — E875 Hyperkalemia: Secondary | ICD-10-CM | POA: Diagnosis not present

## 2023-05-10 DIAGNOSIS — N186 End stage renal disease: Secondary | ICD-10-CM | POA: Diagnosis not present

## 2023-05-10 DIAGNOSIS — I120 Hypertensive chronic kidney disease with stage 5 chronic kidney disease or end stage renal disease: Secondary | ICD-10-CM | POA: Diagnosis not present

## 2023-05-11 DIAGNOSIS — M25551 Pain in right hip: Secondary | ICD-10-CM | POA: Diagnosis not present

## 2023-05-11 DIAGNOSIS — R531 Weakness: Secondary | ICD-10-CM | POA: Diagnosis not present

## 2023-05-11 DIAGNOSIS — N2581 Secondary hyperparathyroidism of renal origin: Secondary | ICD-10-CM | POA: Diagnosis not present

## 2023-05-11 DIAGNOSIS — Z789 Other specified health status: Secondary | ICD-10-CM | POA: Diagnosis not present

## 2023-05-11 DIAGNOSIS — N189 Chronic kidney disease, unspecified: Secondary | ICD-10-CM | POA: Diagnosis not present

## 2023-05-11 DIAGNOSIS — E039 Hypothyroidism, unspecified: Secondary | ICD-10-CM | POA: Diagnosis not present

## 2023-05-11 DIAGNOSIS — I120 Hypertensive chronic kidney disease with stage 5 chronic kidney disease or end stage renal disease: Secondary | ICD-10-CM | POA: Diagnosis not present

## 2023-05-11 DIAGNOSIS — R6889 Other general symptoms and signs: Secondary | ICD-10-CM | POA: Diagnosis not present

## 2023-05-11 DIAGNOSIS — Z7401 Bed confinement status: Secondary | ICD-10-CM | POA: Diagnosis not present

## 2023-05-11 DIAGNOSIS — Z8781 Personal history of (healed) traumatic fracture: Secondary | ICD-10-CM | POA: Diagnosis not present

## 2023-05-11 DIAGNOSIS — I69391 Dysphagia following cerebral infarction: Secondary | ICD-10-CM | POA: Diagnosis not present

## 2023-05-11 DIAGNOSIS — R279 Unspecified lack of coordination: Secondary | ICD-10-CM | POA: Diagnosis not present

## 2023-05-11 DIAGNOSIS — I10 Essential (primary) hypertension: Secondary | ICD-10-CM | POA: Diagnosis not present

## 2023-05-11 DIAGNOSIS — D631 Anemia in chronic kidney disease: Secondary | ICD-10-CM | POA: Diagnosis not present

## 2023-05-11 DIAGNOSIS — D509 Iron deficiency anemia, unspecified: Secondary | ICD-10-CM | POA: Diagnosis not present

## 2023-05-11 DIAGNOSIS — E782 Mixed hyperlipidemia: Secondary | ICD-10-CM | POA: Diagnosis not present

## 2023-05-11 DIAGNOSIS — I639 Cerebral infarction, unspecified: Secondary | ICD-10-CM | POA: Diagnosis not present

## 2023-05-11 DIAGNOSIS — S72144D Nondisplaced intertrochanteric fracture of right femur, subsequent encounter for closed fracture with routine healing: Secondary | ICD-10-CM | POA: Diagnosis not present

## 2023-05-11 DIAGNOSIS — E871 Hypo-osmolality and hyponatremia: Secondary | ICD-10-CM | POA: Diagnosis not present

## 2023-05-11 DIAGNOSIS — N186 End stage renal disease: Secondary | ICD-10-CM | POA: Diagnosis not present

## 2023-05-11 DIAGNOSIS — S72144A Nondisplaced intertrochanteric fracture of right femur, initial encounter for closed fracture: Secondary | ICD-10-CM | POA: Diagnosis not present

## 2023-05-11 DIAGNOSIS — E1169 Type 2 diabetes mellitus with other specified complication: Secondary | ICD-10-CM | POA: Diagnosis not present

## 2023-05-11 DIAGNOSIS — R5381 Other malaise: Secondary | ICD-10-CM | POA: Diagnosis not present

## 2023-05-11 DIAGNOSIS — Z4789 Encounter for other orthopedic aftercare: Secondary | ICD-10-CM | POA: Diagnosis not present

## 2023-05-11 DIAGNOSIS — B999 Unspecified infectious disease: Secondary | ICD-10-CM | POA: Diagnosis not present

## 2023-05-11 DIAGNOSIS — S72141D Displaced intertrochanteric fracture of right femur, subsequent encounter for closed fracture with routine healing: Secondary | ICD-10-CM | POA: Diagnosis not present

## 2023-05-11 DIAGNOSIS — E1129 Type 2 diabetes mellitus with other diabetic kidney complication: Secondary | ICD-10-CM | POA: Diagnosis not present

## 2023-05-11 DIAGNOSIS — W19XXXA Unspecified fall, initial encounter: Secondary | ICD-10-CM | POA: Diagnosis not present

## 2023-05-11 DIAGNOSIS — K7460 Unspecified cirrhosis of liver: Secondary | ICD-10-CM | POA: Diagnosis not present

## 2023-05-11 DIAGNOSIS — E875 Hyperkalemia: Secondary | ICD-10-CM | POA: Diagnosis not present

## 2023-05-11 DIAGNOSIS — E785 Hyperlipidemia, unspecified: Secondary | ICD-10-CM | POA: Diagnosis not present

## 2023-05-11 DIAGNOSIS — E1122 Type 2 diabetes mellitus with diabetic chronic kidney disease: Secondary | ICD-10-CM | POA: Diagnosis not present

## 2023-05-11 DIAGNOSIS — I129 Hypertensive chronic kidney disease with stage 1 through stage 4 chronic kidney disease, or unspecified chronic kidney disease: Secondary | ICD-10-CM | POA: Diagnosis not present

## 2023-05-11 DIAGNOSIS — Z992 Dependence on renal dialysis: Secondary | ICD-10-CM | POA: Diagnosis not present

## 2023-05-11 DIAGNOSIS — K219 Gastro-esophageal reflux disease without esophagitis: Secondary | ICD-10-CM | POA: Diagnosis not present

## 2023-05-11 DIAGNOSIS — Z7901 Long term (current) use of anticoagulants: Secondary | ICD-10-CM | POA: Diagnosis not present

## 2023-05-11 DIAGNOSIS — M6259 Muscle wasting and atrophy, not elsewhere classified, multiple sites: Secondary | ICD-10-CM | POA: Diagnosis not present

## 2023-05-11 DIAGNOSIS — E119 Type 2 diabetes mellitus without complications: Secondary | ICD-10-CM | POA: Diagnosis not present

## 2023-05-13 DIAGNOSIS — Z992 Dependence on renal dialysis: Secondary | ICD-10-CM | POA: Diagnosis not present

## 2023-05-13 DIAGNOSIS — N2581 Secondary hyperparathyroidism of renal origin: Secondary | ICD-10-CM | POA: Diagnosis not present

## 2023-05-13 DIAGNOSIS — N186 End stage renal disease: Secondary | ICD-10-CM | POA: Diagnosis not present

## 2023-05-13 DIAGNOSIS — D631 Anemia in chronic kidney disease: Secondary | ICD-10-CM | POA: Diagnosis not present

## 2023-05-13 DIAGNOSIS — D509 Iron deficiency anemia, unspecified: Secondary | ICD-10-CM | POA: Diagnosis not present

## 2023-05-14 DIAGNOSIS — N186 End stage renal disease: Secondary | ICD-10-CM | POA: Diagnosis not present

## 2023-05-14 DIAGNOSIS — Z8781 Personal history of (healed) traumatic fracture: Secondary | ICD-10-CM | POA: Diagnosis not present

## 2023-05-14 DIAGNOSIS — D631 Anemia in chronic kidney disease: Secondary | ICD-10-CM | POA: Diagnosis not present

## 2023-05-14 DIAGNOSIS — Z992 Dependence on renal dialysis: Secondary | ICD-10-CM | POA: Diagnosis not present

## 2023-05-14 DIAGNOSIS — D509 Iron deficiency anemia, unspecified: Secondary | ICD-10-CM | POA: Diagnosis not present

## 2023-05-14 DIAGNOSIS — N2581 Secondary hyperparathyroidism of renal origin: Secondary | ICD-10-CM | POA: Diagnosis not present

## 2023-05-14 DIAGNOSIS — R5381 Other malaise: Secondary | ICD-10-CM | POA: Diagnosis not present

## 2023-05-15 DIAGNOSIS — N186 End stage renal disease: Secondary | ICD-10-CM | POA: Diagnosis not present

## 2023-05-15 DIAGNOSIS — M25551 Pain in right hip: Secondary | ICD-10-CM | POA: Diagnosis not present

## 2023-05-15 DIAGNOSIS — Z8781 Personal history of (healed) traumatic fracture: Secondary | ICD-10-CM | POA: Diagnosis not present

## 2023-05-15 DIAGNOSIS — R5381 Other malaise: Secondary | ICD-10-CM | POA: Diagnosis not present

## 2023-05-17 DIAGNOSIS — D631 Anemia in chronic kidney disease: Secondary | ICD-10-CM | POA: Diagnosis not present

## 2023-05-17 DIAGNOSIS — N2581 Secondary hyperparathyroidism of renal origin: Secondary | ICD-10-CM | POA: Diagnosis not present

## 2023-05-17 DIAGNOSIS — N186 End stage renal disease: Secondary | ICD-10-CM | POA: Diagnosis not present

## 2023-05-17 DIAGNOSIS — Z992 Dependence on renal dialysis: Secondary | ICD-10-CM | POA: Diagnosis not present

## 2023-05-17 DIAGNOSIS — D509 Iron deficiency anemia, unspecified: Secondary | ICD-10-CM | POA: Diagnosis not present

## 2023-05-18 DIAGNOSIS — N189 Chronic kidney disease, unspecified: Secondary | ICD-10-CM | POA: Diagnosis not present

## 2023-05-18 DIAGNOSIS — I129 Hypertensive chronic kidney disease with stage 1 through stage 4 chronic kidney disease, or unspecified chronic kidney disease: Secondary | ICD-10-CM | POA: Diagnosis not present

## 2023-05-18 DIAGNOSIS — D631 Anemia in chronic kidney disease: Secondary | ICD-10-CM | POA: Diagnosis not present

## 2023-05-18 DIAGNOSIS — I10 Essential (primary) hypertension: Secondary | ICD-10-CM | POA: Diagnosis not present

## 2023-05-18 DIAGNOSIS — E871 Hypo-osmolality and hyponatremia: Secondary | ICD-10-CM | POA: Diagnosis not present

## 2023-05-18 DIAGNOSIS — E119 Type 2 diabetes mellitus without complications: Secondary | ICD-10-CM | POA: Diagnosis not present

## 2023-05-18 DIAGNOSIS — Z7901 Long term (current) use of anticoagulants: Secondary | ICD-10-CM | POA: Diagnosis not present

## 2023-05-20 DIAGNOSIS — D631 Anemia in chronic kidney disease: Secondary | ICD-10-CM | POA: Diagnosis not present

## 2023-05-20 DIAGNOSIS — Z992 Dependence on renal dialysis: Secondary | ICD-10-CM | POA: Diagnosis not present

## 2023-05-20 DIAGNOSIS — N2581 Secondary hyperparathyroidism of renal origin: Secondary | ICD-10-CM | POA: Diagnosis not present

## 2023-05-20 DIAGNOSIS — N186 End stage renal disease: Secondary | ICD-10-CM | POA: Diagnosis not present

## 2023-05-20 DIAGNOSIS — D509 Iron deficiency anemia, unspecified: Secondary | ICD-10-CM | POA: Diagnosis not present

## 2023-05-22 DIAGNOSIS — D509 Iron deficiency anemia, unspecified: Secondary | ICD-10-CM | POA: Diagnosis not present

## 2023-05-22 DIAGNOSIS — Z992 Dependence on renal dialysis: Secondary | ICD-10-CM | POA: Diagnosis not present

## 2023-05-22 DIAGNOSIS — D631 Anemia in chronic kidney disease: Secondary | ICD-10-CM | POA: Diagnosis not present

## 2023-05-22 DIAGNOSIS — N186 End stage renal disease: Secondary | ICD-10-CM | POA: Diagnosis not present

## 2023-05-22 DIAGNOSIS — N2581 Secondary hyperparathyroidism of renal origin: Secondary | ICD-10-CM | POA: Diagnosis not present

## 2023-05-23 DIAGNOSIS — Z992 Dependence on renal dialysis: Secondary | ICD-10-CM | POA: Diagnosis not present

## 2023-05-23 DIAGNOSIS — D509 Iron deficiency anemia, unspecified: Secondary | ICD-10-CM | POA: Diagnosis not present

## 2023-05-23 DIAGNOSIS — N186 End stage renal disease: Secondary | ICD-10-CM | POA: Diagnosis not present

## 2023-05-23 DIAGNOSIS — D631 Anemia in chronic kidney disease: Secondary | ICD-10-CM | POA: Diagnosis not present

## 2023-05-23 DIAGNOSIS — N2581 Secondary hyperparathyroidism of renal origin: Secondary | ICD-10-CM | POA: Diagnosis not present

## 2023-05-24 DIAGNOSIS — N186 End stage renal disease: Secondary | ICD-10-CM | POA: Diagnosis not present

## 2023-05-24 DIAGNOSIS — Z992 Dependence on renal dialysis: Secondary | ICD-10-CM | POA: Diagnosis not present

## 2023-05-24 DIAGNOSIS — N2581 Secondary hyperparathyroidism of renal origin: Secondary | ICD-10-CM | POA: Diagnosis not present

## 2023-05-24 DIAGNOSIS — D509 Iron deficiency anemia, unspecified: Secondary | ICD-10-CM | POA: Diagnosis not present

## 2023-05-24 DIAGNOSIS — D631 Anemia in chronic kidney disease: Secondary | ICD-10-CM | POA: Diagnosis not present

## 2023-05-25 DIAGNOSIS — Z992 Dependence on renal dialysis: Secondary | ICD-10-CM | POA: Diagnosis not present

## 2023-05-25 DIAGNOSIS — N186 End stage renal disease: Secondary | ICD-10-CM | POA: Diagnosis not present

## 2023-05-25 DIAGNOSIS — E1122 Type 2 diabetes mellitus with diabetic chronic kidney disease: Secondary | ICD-10-CM | POA: Diagnosis not present

## 2023-05-27 DIAGNOSIS — D631 Anemia in chronic kidney disease: Secondary | ICD-10-CM | POA: Diagnosis not present

## 2023-05-27 DIAGNOSIS — N186 End stage renal disease: Secondary | ICD-10-CM | POA: Diagnosis not present

## 2023-05-27 DIAGNOSIS — Z992 Dependence on renal dialysis: Secondary | ICD-10-CM | POA: Diagnosis not present

## 2023-05-27 DIAGNOSIS — D509 Iron deficiency anemia, unspecified: Secondary | ICD-10-CM | POA: Diagnosis not present

## 2023-05-27 DIAGNOSIS — N2581 Secondary hyperparathyroidism of renal origin: Secondary | ICD-10-CM | POA: Diagnosis not present

## 2023-05-29 DIAGNOSIS — D509 Iron deficiency anemia, unspecified: Secondary | ICD-10-CM | POA: Diagnosis not present

## 2023-05-29 DIAGNOSIS — D631 Anemia in chronic kidney disease: Secondary | ICD-10-CM | POA: Diagnosis not present

## 2023-05-29 DIAGNOSIS — N186 End stage renal disease: Secondary | ICD-10-CM | POA: Diagnosis not present

## 2023-05-29 DIAGNOSIS — Z992 Dependence on renal dialysis: Secondary | ICD-10-CM | POA: Diagnosis not present

## 2023-05-29 DIAGNOSIS — N2581 Secondary hyperparathyroidism of renal origin: Secondary | ICD-10-CM | POA: Diagnosis not present

## 2023-05-31 DIAGNOSIS — N2581 Secondary hyperparathyroidism of renal origin: Secondary | ICD-10-CM | POA: Diagnosis not present

## 2023-05-31 DIAGNOSIS — N186 End stage renal disease: Secondary | ICD-10-CM | POA: Diagnosis not present

## 2023-05-31 DIAGNOSIS — D509 Iron deficiency anemia, unspecified: Secondary | ICD-10-CM | POA: Diagnosis not present

## 2023-05-31 DIAGNOSIS — Z992 Dependence on renal dialysis: Secondary | ICD-10-CM | POA: Diagnosis not present

## 2023-05-31 DIAGNOSIS — D631 Anemia in chronic kidney disease: Secondary | ICD-10-CM | POA: Diagnosis not present

## 2023-06-03 DIAGNOSIS — N2581 Secondary hyperparathyroidism of renal origin: Secondary | ICD-10-CM | POA: Diagnosis not present

## 2023-06-03 DIAGNOSIS — D631 Anemia in chronic kidney disease: Secondary | ICD-10-CM | POA: Diagnosis not present

## 2023-06-03 DIAGNOSIS — D509 Iron deficiency anemia, unspecified: Secondary | ICD-10-CM | POA: Diagnosis not present

## 2023-06-03 DIAGNOSIS — Z992 Dependence on renal dialysis: Secondary | ICD-10-CM | POA: Diagnosis not present

## 2023-06-03 DIAGNOSIS — N186 End stage renal disease: Secondary | ICD-10-CM | POA: Diagnosis not present

## 2023-06-04 DIAGNOSIS — E1169 Type 2 diabetes mellitus with other specified complication: Secondary | ICD-10-CM | POA: Diagnosis not present

## 2023-06-04 DIAGNOSIS — Z8781 Personal history of (healed) traumatic fracture: Secondary | ICD-10-CM | POA: Diagnosis not present

## 2023-06-04 DIAGNOSIS — E785 Hyperlipidemia, unspecified: Secondary | ICD-10-CM | POA: Diagnosis not present

## 2023-06-04 DIAGNOSIS — R5381 Other malaise: Secondary | ICD-10-CM | POA: Diagnosis not present

## 2023-06-05 DIAGNOSIS — Z992 Dependence on renal dialysis: Secondary | ICD-10-CM | POA: Diagnosis not present

## 2023-06-05 DIAGNOSIS — N186 End stage renal disease: Secondary | ICD-10-CM | POA: Diagnosis not present

## 2023-06-05 DIAGNOSIS — N2581 Secondary hyperparathyroidism of renal origin: Secondary | ICD-10-CM | POA: Diagnosis not present

## 2023-06-05 DIAGNOSIS — D509 Iron deficiency anemia, unspecified: Secondary | ICD-10-CM | POA: Diagnosis not present

## 2023-06-05 DIAGNOSIS — D631 Anemia in chronic kidney disease: Secondary | ICD-10-CM | POA: Diagnosis not present

## 2023-06-06 DIAGNOSIS — E1169 Type 2 diabetes mellitus with other specified complication: Secondary | ICD-10-CM | POA: Diagnosis not present

## 2023-06-06 DIAGNOSIS — S72141A Displaced intertrochanteric fracture of right femur, initial encounter for closed fracture: Secondary | ICD-10-CM | POA: Diagnosis not present

## 2023-06-06 DIAGNOSIS — S72143A Displaced intertrochanteric fracture of unspecified femur, initial encounter for closed fracture: Secondary | ICD-10-CM | POA: Diagnosis not present

## 2023-06-06 DIAGNOSIS — S72141D Displaced intertrochanteric fracture of right femur, subsequent encounter for closed fracture with routine healing: Secondary | ICD-10-CM | POA: Diagnosis not present

## 2023-06-07 DIAGNOSIS — N186 End stage renal disease: Secondary | ICD-10-CM | POA: Diagnosis not present

## 2023-06-07 DIAGNOSIS — N2581 Secondary hyperparathyroidism of renal origin: Secondary | ICD-10-CM | POA: Diagnosis not present

## 2023-06-07 DIAGNOSIS — L89890 Pressure ulcer of other site, unstageable: Secondary | ICD-10-CM | POA: Diagnosis not present

## 2023-06-07 DIAGNOSIS — D631 Anemia in chronic kidney disease: Secondary | ICD-10-CM | POA: Diagnosis not present

## 2023-06-07 DIAGNOSIS — Z8781 Personal history of (healed) traumatic fracture: Secondary | ICD-10-CM | POA: Diagnosis not present

## 2023-06-07 DIAGNOSIS — D509 Iron deficiency anemia, unspecified: Secondary | ICD-10-CM | POA: Diagnosis not present

## 2023-06-07 DIAGNOSIS — Z992 Dependence on renal dialysis: Secondary | ICD-10-CM | POA: Diagnosis not present

## 2023-06-07 DIAGNOSIS — L89610 Pressure ulcer of right heel, unstageable: Secondary | ICD-10-CM | POA: Diagnosis not present

## 2023-06-07 DIAGNOSIS — Z6823 Body mass index (BMI) 23.0-23.9, adult: Secondary | ICD-10-CM | POA: Diagnosis not present

## 2023-06-07 DIAGNOSIS — E1142 Type 2 diabetes mellitus with diabetic polyneuropathy: Secondary | ICD-10-CM | POA: Diagnosis not present

## 2023-06-10 DIAGNOSIS — N186 End stage renal disease: Secondary | ICD-10-CM | POA: Diagnosis not present

## 2023-06-10 DIAGNOSIS — N2581 Secondary hyperparathyroidism of renal origin: Secondary | ICD-10-CM | POA: Diagnosis not present

## 2023-06-10 DIAGNOSIS — Z992 Dependence on renal dialysis: Secondary | ICD-10-CM | POA: Diagnosis not present

## 2023-06-10 DIAGNOSIS — D631 Anemia in chronic kidney disease: Secondary | ICD-10-CM | POA: Diagnosis not present

## 2023-06-10 DIAGNOSIS — D509 Iron deficiency anemia, unspecified: Secondary | ICD-10-CM | POA: Diagnosis not present

## 2023-06-12 DIAGNOSIS — D509 Iron deficiency anemia, unspecified: Secondary | ICD-10-CM | POA: Diagnosis not present

## 2023-06-12 DIAGNOSIS — N186 End stage renal disease: Secondary | ICD-10-CM | POA: Diagnosis not present

## 2023-06-12 DIAGNOSIS — N2581 Secondary hyperparathyroidism of renal origin: Secondary | ICD-10-CM | POA: Diagnosis not present

## 2023-06-12 DIAGNOSIS — Z992 Dependence on renal dialysis: Secondary | ICD-10-CM | POA: Diagnosis not present

## 2023-06-12 DIAGNOSIS — D631 Anemia in chronic kidney disease: Secondary | ICD-10-CM | POA: Diagnosis not present

## 2023-06-13 DIAGNOSIS — I69359 Hemiplegia and hemiparesis following cerebral infarction affecting unspecified side: Secondary | ICD-10-CM | POA: Diagnosis not present

## 2023-06-13 DIAGNOSIS — E1151 Type 2 diabetes mellitus with diabetic peripheral angiopathy without gangrene: Secondary | ICD-10-CM | POA: Diagnosis not present

## 2023-06-13 DIAGNOSIS — I120 Hypertensive chronic kidney disease with stage 5 chronic kidney disease or end stage renal disease: Secondary | ICD-10-CM | POA: Diagnosis not present

## 2023-06-13 DIAGNOSIS — R32 Unspecified urinary incontinence: Secondary | ICD-10-CM | POA: Diagnosis not present

## 2023-06-13 DIAGNOSIS — E1142 Type 2 diabetes mellitus with diabetic polyneuropathy: Secondary | ICD-10-CM | POA: Diagnosis not present

## 2023-06-13 DIAGNOSIS — R1310 Dysphagia, unspecified: Secondary | ICD-10-CM | POA: Diagnosis not present

## 2023-06-13 DIAGNOSIS — E1122 Type 2 diabetes mellitus with diabetic chronic kidney disease: Secondary | ICD-10-CM | POA: Diagnosis not present

## 2023-06-13 DIAGNOSIS — K7460 Unspecified cirrhosis of liver: Secondary | ICD-10-CM | POA: Diagnosis not present

## 2023-06-13 DIAGNOSIS — L89610 Pressure ulcer of right heel, unstageable: Secondary | ICD-10-CM | POA: Diagnosis not present

## 2023-06-13 DIAGNOSIS — I10 Essential (primary) hypertension: Secondary | ICD-10-CM | POA: Diagnosis not present

## 2023-06-13 DIAGNOSIS — L89890 Pressure ulcer of other site, unstageable: Secondary | ICD-10-CM | POA: Diagnosis not present

## 2023-06-13 DIAGNOSIS — Z7901 Long term (current) use of anticoagulants: Secondary | ICD-10-CM | POA: Diagnosis not present

## 2023-06-13 DIAGNOSIS — N186 End stage renal disease: Secondary | ICD-10-CM | POA: Diagnosis not present

## 2023-06-13 DIAGNOSIS — E0590 Thyrotoxicosis, unspecified without thyrotoxic crisis or storm: Secondary | ICD-10-CM | POA: Diagnosis not present

## 2023-06-13 DIAGNOSIS — E1169 Type 2 diabetes mellitus with other specified complication: Secondary | ICD-10-CM | POA: Diagnosis not present

## 2023-06-13 DIAGNOSIS — Z992 Dependence on renal dialysis: Secondary | ICD-10-CM | POA: Diagnosis not present

## 2023-06-13 DIAGNOSIS — Z604 Social exclusion and rejection: Secondary | ICD-10-CM | POA: Diagnosis not present

## 2023-06-13 DIAGNOSIS — Z556 Problems related to health literacy: Secondary | ICD-10-CM | POA: Diagnosis not present

## 2023-06-13 DIAGNOSIS — Z6823 Body mass index (BMI) 23.0-23.9, adult: Secondary | ICD-10-CM | POA: Diagnosis not present

## 2023-06-13 DIAGNOSIS — E785 Hyperlipidemia, unspecified: Secondary | ICD-10-CM | POA: Diagnosis not present

## 2023-06-13 DIAGNOSIS — S72141D Displaced intertrochanteric fracture of right femur, subsequent encounter for closed fracture with routine healing: Secondary | ICD-10-CM | POA: Diagnosis not present

## 2023-06-13 DIAGNOSIS — R0989 Other specified symptoms and signs involving the circulatory and respiratory systems: Secondary | ICD-10-CM | POA: Diagnosis not present

## 2023-06-14 DIAGNOSIS — I4510 Unspecified right bundle-branch block: Secondary | ICD-10-CM | POA: Diagnosis not present

## 2023-06-14 DIAGNOSIS — E11621 Type 2 diabetes mellitus with foot ulcer: Secondary | ICD-10-CM | POA: Diagnosis not present

## 2023-06-14 DIAGNOSIS — Z136 Encounter for screening for cardiovascular disorders: Secondary | ICD-10-CM | POA: Diagnosis not present

## 2023-06-14 DIAGNOSIS — D509 Iron deficiency anemia, unspecified: Secondary | ICD-10-CM | POA: Diagnosis not present

## 2023-06-14 DIAGNOSIS — E1122 Type 2 diabetes mellitus with diabetic chronic kidney disease: Secondary | ICD-10-CM | POA: Diagnosis not present

## 2023-06-14 DIAGNOSIS — Z992 Dependence on renal dialysis: Secondary | ICD-10-CM | POA: Diagnosis not present

## 2023-06-14 DIAGNOSIS — R9431 Abnormal electrocardiogram [ECG] [EKG]: Secondary | ICD-10-CM | POA: Diagnosis not present

## 2023-06-14 DIAGNOSIS — N186 End stage renal disease: Secondary | ICD-10-CM | POA: Diagnosis not present

## 2023-06-14 DIAGNOSIS — R404 Transient alteration of awareness: Secondary | ICD-10-CM | POA: Diagnosis not present

## 2023-06-14 DIAGNOSIS — I444 Left anterior fascicular block: Secondary | ICD-10-CM | POA: Diagnosis not present

## 2023-06-14 DIAGNOSIS — L97529 Non-pressure chronic ulcer of other part of left foot with unspecified severity: Secondary | ICD-10-CM | POA: Diagnosis not present

## 2023-06-14 DIAGNOSIS — I120 Hypertensive chronic kidney disease with stage 5 chronic kidney disease or end stage renal disease: Secondary | ICD-10-CM | POA: Diagnosis not present

## 2023-06-14 DIAGNOSIS — R4182 Altered mental status, unspecified: Secondary | ICD-10-CM | POA: Diagnosis not present

## 2023-06-14 DIAGNOSIS — R739 Hyperglycemia, unspecified: Secondary | ICD-10-CM | POA: Diagnosis not present

## 2023-06-14 DIAGNOSIS — L97419 Non-pressure chronic ulcer of right heel and midfoot with unspecified severity: Secondary | ICD-10-CM | POA: Diagnosis not present

## 2023-06-14 DIAGNOSIS — R410 Disorientation, unspecified: Secondary | ICD-10-CM | POA: Diagnosis not present

## 2023-06-14 DIAGNOSIS — N2581 Secondary hyperparathyroidism of renal origin: Secondary | ICD-10-CM | POA: Diagnosis not present

## 2023-06-14 DIAGNOSIS — I10 Essential (primary) hypertension: Secondary | ICD-10-CM | POA: Diagnosis not present

## 2023-06-14 DIAGNOSIS — D631 Anemia in chronic kidney disease: Secondary | ICD-10-CM | POA: Diagnosis not present

## 2023-06-16 DIAGNOSIS — Z7902 Long term (current) use of antithrombotics/antiplatelets: Secondary | ICD-10-CM | POA: Diagnosis not present

## 2023-06-16 DIAGNOSIS — I10 Essential (primary) hypertension: Secondary | ICD-10-CM | POA: Diagnosis not present

## 2023-06-16 DIAGNOSIS — N186 End stage renal disease: Secondary | ICD-10-CM | POA: Diagnosis not present

## 2023-06-16 DIAGNOSIS — S0081XA Abrasion of other part of head, initial encounter: Secondary | ICD-10-CM | POA: Diagnosis not present

## 2023-06-16 DIAGNOSIS — Z87891 Personal history of nicotine dependence: Secondary | ICD-10-CM | POA: Diagnosis not present

## 2023-06-16 DIAGNOSIS — S8001XA Contusion of right knee, initial encounter: Secondary | ICD-10-CM | POA: Diagnosis not present

## 2023-06-16 DIAGNOSIS — W050XXA Fall from non-moving wheelchair, initial encounter: Secondary | ICD-10-CM | POA: Diagnosis not present

## 2023-06-16 DIAGNOSIS — Z8673 Personal history of transient ischemic attack (TIA), and cerebral infarction without residual deficits: Secondary | ICD-10-CM | POA: Diagnosis not present

## 2023-06-16 DIAGNOSIS — R930 Abnormal findings on diagnostic imaging of skull and head, not elsewhere classified: Secondary | ICD-10-CM | POA: Diagnosis not present

## 2023-06-17 DIAGNOSIS — N186 End stage renal disease: Secondary | ICD-10-CM | POA: Diagnosis not present

## 2023-06-17 DIAGNOSIS — Z992 Dependence on renal dialysis: Secondary | ICD-10-CM | POA: Diagnosis not present

## 2023-06-17 DIAGNOSIS — D631 Anemia in chronic kidney disease: Secondary | ICD-10-CM | POA: Diagnosis not present

## 2023-06-17 DIAGNOSIS — D509 Iron deficiency anemia, unspecified: Secondary | ICD-10-CM | POA: Diagnosis not present

## 2023-06-17 DIAGNOSIS — N2581 Secondary hyperparathyroidism of renal origin: Secondary | ICD-10-CM | POA: Diagnosis not present

## 2023-06-19 DIAGNOSIS — D509 Iron deficiency anemia, unspecified: Secondary | ICD-10-CM | POA: Diagnosis not present

## 2023-06-19 DIAGNOSIS — N186 End stage renal disease: Secondary | ICD-10-CM | POA: Diagnosis not present

## 2023-06-19 DIAGNOSIS — Z992 Dependence on renal dialysis: Secondary | ICD-10-CM | POA: Diagnosis not present

## 2023-06-19 DIAGNOSIS — N2581 Secondary hyperparathyroidism of renal origin: Secondary | ICD-10-CM | POA: Diagnosis not present

## 2023-06-19 DIAGNOSIS — D631 Anemia in chronic kidney disease: Secondary | ICD-10-CM | POA: Diagnosis not present

## 2023-06-20 DIAGNOSIS — Z9181 History of falling: Secondary | ICD-10-CM | POA: Diagnosis not present

## 2023-06-20 DIAGNOSIS — I120 Hypertensive chronic kidney disease with stage 5 chronic kidney disease or end stage renal disease: Secondary | ICD-10-CM | POA: Diagnosis not present

## 2023-06-20 DIAGNOSIS — Z7689 Persons encountering health services in other specified circumstances: Secondary | ICD-10-CM | POA: Diagnosis not present

## 2023-06-20 DIAGNOSIS — S72141D Displaced intertrochanteric fracture of right femur, subsequent encounter for closed fracture with routine healing: Secondary | ICD-10-CM | POA: Diagnosis not present

## 2023-06-20 DIAGNOSIS — N186 End stage renal disease: Secondary | ICD-10-CM | POA: Diagnosis not present

## 2023-06-20 DIAGNOSIS — L89890 Pressure ulcer of other site, unstageable: Secondary | ICD-10-CM | POA: Diagnosis not present

## 2023-06-20 DIAGNOSIS — L89610 Pressure ulcer of right heel, unstageable: Secondary | ICD-10-CM | POA: Diagnosis not present

## 2023-06-20 DIAGNOSIS — Z6823 Body mass index (BMI) 23.0-23.9, adult: Secondary | ICD-10-CM | POA: Diagnosis not present

## 2023-06-20 DIAGNOSIS — E1122 Type 2 diabetes mellitus with diabetic chronic kidney disease: Secondary | ICD-10-CM | POA: Diagnosis not present

## 2023-06-20 DIAGNOSIS — R4182 Altered mental status, unspecified: Secondary | ICD-10-CM | POA: Diagnosis not present

## 2023-06-21 DIAGNOSIS — N186 End stage renal disease: Secondary | ICD-10-CM | POA: Diagnosis not present

## 2023-06-21 DIAGNOSIS — D631 Anemia in chronic kidney disease: Secondary | ICD-10-CM | POA: Diagnosis not present

## 2023-06-21 DIAGNOSIS — Z992 Dependence on renal dialysis: Secondary | ICD-10-CM | POA: Diagnosis not present

## 2023-06-21 DIAGNOSIS — N2581 Secondary hyperparathyroidism of renal origin: Secondary | ICD-10-CM | POA: Diagnosis not present

## 2023-06-21 DIAGNOSIS — D509 Iron deficiency anemia, unspecified: Secondary | ICD-10-CM | POA: Diagnosis not present

## 2023-06-24 DIAGNOSIS — D631 Anemia in chronic kidney disease: Secondary | ICD-10-CM | POA: Diagnosis not present

## 2023-06-24 DIAGNOSIS — D509 Iron deficiency anemia, unspecified: Secondary | ICD-10-CM | POA: Diagnosis not present

## 2023-06-24 DIAGNOSIS — N186 End stage renal disease: Secondary | ICD-10-CM | POA: Diagnosis not present

## 2023-06-24 DIAGNOSIS — Z992 Dependence on renal dialysis: Secondary | ICD-10-CM | POA: Diagnosis not present

## 2023-06-24 DIAGNOSIS — N2581 Secondary hyperparathyroidism of renal origin: Secondary | ICD-10-CM | POA: Diagnosis not present

## 2023-06-25 DIAGNOSIS — D631 Anemia in chronic kidney disease: Secondary | ICD-10-CM | POA: Diagnosis not present

## 2023-06-25 DIAGNOSIS — S72141D Displaced intertrochanteric fracture of right femur, subsequent encounter for closed fracture with routine healing: Secondary | ICD-10-CM | POA: Diagnosis not present

## 2023-06-25 DIAGNOSIS — N2581 Secondary hyperparathyroidism of renal origin: Secondary | ICD-10-CM | POA: Diagnosis not present

## 2023-06-25 DIAGNOSIS — L89890 Pressure ulcer of other site, unstageable: Secondary | ICD-10-CM | POA: Diagnosis not present

## 2023-06-25 DIAGNOSIS — N186 End stage renal disease: Secondary | ICD-10-CM | POA: Diagnosis not present

## 2023-06-25 DIAGNOSIS — E1122 Type 2 diabetes mellitus with diabetic chronic kidney disease: Secondary | ICD-10-CM | POA: Diagnosis not present

## 2023-06-25 DIAGNOSIS — L89610 Pressure ulcer of right heel, unstageable: Secondary | ICD-10-CM | POA: Diagnosis not present

## 2023-06-25 DIAGNOSIS — D509 Iron deficiency anemia, unspecified: Secondary | ICD-10-CM | POA: Diagnosis not present

## 2023-06-25 DIAGNOSIS — I120 Hypertensive chronic kidney disease with stage 5 chronic kidney disease or end stage renal disease: Secondary | ICD-10-CM | POA: Diagnosis not present

## 2023-06-25 DIAGNOSIS — Z992 Dependence on renal dialysis: Secondary | ICD-10-CM | POA: Diagnosis not present

## 2023-06-26 DIAGNOSIS — Z992 Dependence on renal dialysis: Secondary | ICD-10-CM | POA: Diagnosis not present

## 2023-06-26 DIAGNOSIS — N186 End stage renal disease: Secondary | ICD-10-CM | POA: Diagnosis not present

## 2023-06-26 DIAGNOSIS — D631 Anemia in chronic kidney disease: Secondary | ICD-10-CM | POA: Diagnosis not present

## 2023-06-26 DIAGNOSIS — D509 Iron deficiency anemia, unspecified: Secondary | ICD-10-CM | POA: Diagnosis not present

## 2023-06-26 DIAGNOSIS — N2581 Secondary hyperparathyroidism of renal origin: Secondary | ICD-10-CM | POA: Diagnosis not present

## 2023-06-27 DIAGNOSIS — S72141D Displaced intertrochanteric fracture of right femur, subsequent encounter for closed fracture with routine healing: Secondary | ICD-10-CM | POA: Diagnosis not present

## 2023-06-27 DIAGNOSIS — N186 End stage renal disease: Secondary | ICD-10-CM | POA: Diagnosis not present

## 2023-06-27 DIAGNOSIS — I120 Hypertensive chronic kidney disease with stage 5 chronic kidney disease or end stage renal disease: Secondary | ICD-10-CM | POA: Diagnosis not present

## 2023-06-27 DIAGNOSIS — E1122 Type 2 diabetes mellitus with diabetic chronic kidney disease: Secondary | ICD-10-CM | POA: Diagnosis not present

## 2023-06-27 DIAGNOSIS — L89610 Pressure ulcer of right heel, unstageable: Secondary | ICD-10-CM | POA: Diagnosis not present

## 2023-06-27 DIAGNOSIS — L89890 Pressure ulcer of other site, unstageable: Secondary | ICD-10-CM | POA: Diagnosis not present

## 2023-06-28 DIAGNOSIS — N186 End stage renal disease: Secondary | ICD-10-CM | POA: Diagnosis not present

## 2023-06-28 DIAGNOSIS — D509 Iron deficiency anemia, unspecified: Secondary | ICD-10-CM | POA: Diagnosis not present

## 2023-06-28 DIAGNOSIS — Z992 Dependence on renal dialysis: Secondary | ICD-10-CM | POA: Diagnosis not present

## 2023-06-28 DIAGNOSIS — N2581 Secondary hyperparathyroidism of renal origin: Secondary | ICD-10-CM | POA: Diagnosis not present

## 2023-06-28 DIAGNOSIS — D631 Anemia in chronic kidney disease: Secondary | ICD-10-CM | POA: Diagnosis not present

## 2023-07-01 DIAGNOSIS — D509 Iron deficiency anemia, unspecified: Secondary | ICD-10-CM | POA: Diagnosis not present

## 2023-07-01 DIAGNOSIS — N186 End stage renal disease: Secondary | ICD-10-CM | POA: Diagnosis not present

## 2023-07-01 DIAGNOSIS — D631 Anemia in chronic kidney disease: Secondary | ICD-10-CM | POA: Diagnosis not present

## 2023-07-01 DIAGNOSIS — Z992 Dependence on renal dialysis: Secondary | ICD-10-CM | POA: Diagnosis not present

## 2023-07-01 DIAGNOSIS — N2581 Secondary hyperparathyroidism of renal origin: Secondary | ICD-10-CM | POA: Diagnosis not present

## 2023-07-02 DIAGNOSIS — L89890 Pressure ulcer of other site, unstageable: Secondary | ICD-10-CM | POA: Diagnosis not present

## 2023-07-02 DIAGNOSIS — E1122 Type 2 diabetes mellitus with diabetic chronic kidney disease: Secondary | ICD-10-CM | POA: Diagnosis not present

## 2023-07-02 DIAGNOSIS — S72141D Displaced intertrochanteric fracture of right femur, subsequent encounter for closed fracture with routine healing: Secondary | ICD-10-CM | POA: Diagnosis not present

## 2023-07-02 DIAGNOSIS — N186 End stage renal disease: Secondary | ICD-10-CM | POA: Diagnosis not present

## 2023-07-02 DIAGNOSIS — L89610 Pressure ulcer of right heel, unstageable: Secondary | ICD-10-CM | POA: Diagnosis not present

## 2023-07-02 DIAGNOSIS — I120 Hypertensive chronic kidney disease with stage 5 chronic kidney disease or end stage renal disease: Secondary | ICD-10-CM | POA: Diagnosis not present

## 2023-07-03 DIAGNOSIS — D509 Iron deficiency anemia, unspecified: Secondary | ICD-10-CM | POA: Diagnosis not present

## 2023-07-03 DIAGNOSIS — D631 Anemia in chronic kidney disease: Secondary | ICD-10-CM | POA: Diagnosis not present

## 2023-07-03 DIAGNOSIS — Z992 Dependence on renal dialysis: Secondary | ICD-10-CM | POA: Diagnosis not present

## 2023-07-03 DIAGNOSIS — N2581 Secondary hyperparathyroidism of renal origin: Secondary | ICD-10-CM | POA: Diagnosis not present

## 2023-07-03 DIAGNOSIS — N186 End stage renal disease: Secondary | ICD-10-CM | POA: Diagnosis not present

## 2023-07-04 DIAGNOSIS — L89890 Pressure ulcer of other site, unstageable: Secondary | ICD-10-CM | POA: Diagnosis not present

## 2023-07-04 DIAGNOSIS — S72141D Displaced intertrochanteric fracture of right femur, subsequent encounter for closed fracture with routine healing: Secondary | ICD-10-CM | POA: Diagnosis not present

## 2023-07-04 DIAGNOSIS — N186 End stage renal disease: Secondary | ICD-10-CM | POA: Diagnosis not present

## 2023-07-04 DIAGNOSIS — L89610 Pressure ulcer of right heel, unstageable: Secondary | ICD-10-CM | POA: Diagnosis not present

## 2023-07-04 DIAGNOSIS — I120 Hypertensive chronic kidney disease with stage 5 chronic kidney disease or end stage renal disease: Secondary | ICD-10-CM | POA: Diagnosis not present

## 2023-07-04 DIAGNOSIS — E1122 Type 2 diabetes mellitus with diabetic chronic kidney disease: Secondary | ICD-10-CM | POA: Diagnosis not present

## 2023-07-05 DIAGNOSIS — N2581 Secondary hyperparathyroidism of renal origin: Secondary | ICD-10-CM | POA: Diagnosis not present

## 2023-07-05 DIAGNOSIS — N186 End stage renal disease: Secondary | ICD-10-CM | POA: Diagnosis not present

## 2023-07-05 DIAGNOSIS — D509 Iron deficiency anemia, unspecified: Secondary | ICD-10-CM | POA: Diagnosis not present

## 2023-07-05 DIAGNOSIS — Z992 Dependence on renal dialysis: Secondary | ICD-10-CM | POA: Diagnosis not present

## 2023-07-05 DIAGNOSIS — D631 Anemia in chronic kidney disease: Secondary | ICD-10-CM | POA: Diagnosis not present

## 2023-07-08 DIAGNOSIS — Z992 Dependence on renal dialysis: Secondary | ICD-10-CM | POA: Diagnosis not present

## 2023-07-08 DIAGNOSIS — D631 Anemia in chronic kidney disease: Secondary | ICD-10-CM | POA: Diagnosis not present

## 2023-07-08 DIAGNOSIS — N2581 Secondary hyperparathyroidism of renal origin: Secondary | ICD-10-CM | POA: Diagnosis not present

## 2023-07-08 DIAGNOSIS — N186 End stage renal disease: Secondary | ICD-10-CM | POA: Diagnosis not present

## 2023-07-08 DIAGNOSIS — D509 Iron deficiency anemia, unspecified: Secondary | ICD-10-CM | POA: Diagnosis not present

## 2023-07-09 DIAGNOSIS — F01B11 Vascular dementia, moderate, with agitation: Secondary | ICD-10-CM | POA: Diagnosis not present

## 2023-07-09 DIAGNOSIS — L89890 Pressure ulcer of other site, unstageable: Secondary | ICD-10-CM | POA: Diagnosis not present

## 2023-07-09 DIAGNOSIS — E1122 Type 2 diabetes mellitus with diabetic chronic kidney disease: Secondary | ICD-10-CM | POA: Diagnosis not present

## 2023-07-09 DIAGNOSIS — L89610 Pressure ulcer of right heel, unstageable: Secondary | ICD-10-CM | POA: Diagnosis not present

## 2023-07-09 DIAGNOSIS — S72141D Displaced intertrochanteric fracture of right femur, subsequent encounter for closed fracture with routine healing: Secondary | ICD-10-CM | POA: Diagnosis not present

## 2023-07-09 DIAGNOSIS — I120 Hypertensive chronic kidney disease with stage 5 chronic kidney disease or end stage renal disease: Secondary | ICD-10-CM | POA: Diagnosis not present

## 2023-07-09 DIAGNOSIS — N186 End stage renal disease: Secondary | ICD-10-CM | POA: Diagnosis not present

## 2023-07-10 DIAGNOSIS — D631 Anemia in chronic kidney disease: Secondary | ICD-10-CM | POA: Diagnosis not present

## 2023-07-10 DIAGNOSIS — N186 End stage renal disease: Secondary | ICD-10-CM | POA: Diagnosis not present

## 2023-07-10 DIAGNOSIS — N2581 Secondary hyperparathyroidism of renal origin: Secondary | ICD-10-CM | POA: Diagnosis not present

## 2023-07-10 DIAGNOSIS — D509 Iron deficiency anemia, unspecified: Secondary | ICD-10-CM | POA: Diagnosis not present

## 2023-07-10 DIAGNOSIS — Z992 Dependence on renal dialysis: Secondary | ICD-10-CM | POA: Diagnosis not present

## 2023-07-12 DIAGNOSIS — D631 Anemia in chronic kidney disease: Secondary | ICD-10-CM | POA: Diagnosis not present

## 2023-07-12 DIAGNOSIS — N2581 Secondary hyperparathyroidism of renal origin: Secondary | ICD-10-CM | POA: Diagnosis not present

## 2023-07-12 DIAGNOSIS — Z992 Dependence on renal dialysis: Secondary | ICD-10-CM | POA: Diagnosis not present

## 2023-07-12 DIAGNOSIS — D509 Iron deficiency anemia, unspecified: Secondary | ICD-10-CM | POA: Diagnosis not present

## 2023-07-12 DIAGNOSIS — N186 End stage renal disease: Secondary | ICD-10-CM | POA: Diagnosis not present

## 2023-07-13 DIAGNOSIS — I69359 Hemiplegia and hemiparesis following cerebral infarction affecting unspecified side: Secondary | ICD-10-CM | POA: Diagnosis not present

## 2023-07-13 DIAGNOSIS — N186 End stage renal disease: Secondary | ICD-10-CM | POA: Diagnosis not present

## 2023-07-13 DIAGNOSIS — Z7901 Long term (current) use of anticoagulants: Secondary | ICD-10-CM | POA: Diagnosis not present

## 2023-07-13 DIAGNOSIS — R32 Unspecified urinary incontinence: Secondary | ICD-10-CM | POA: Diagnosis not present

## 2023-07-13 DIAGNOSIS — E1122 Type 2 diabetes mellitus with diabetic chronic kidney disease: Secondary | ICD-10-CM | POA: Diagnosis not present

## 2023-07-13 DIAGNOSIS — Z604 Social exclusion and rejection: Secondary | ICD-10-CM | POA: Diagnosis not present

## 2023-07-13 DIAGNOSIS — S72141D Displaced intertrochanteric fracture of right femur, subsequent encounter for closed fracture with routine healing: Secondary | ICD-10-CM | POA: Diagnosis not present

## 2023-07-13 DIAGNOSIS — Z992 Dependence on renal dialysis: Secondary | ICD-10-CM | POA: Diagnosis not present

## 2023-07-13 DIAGNOSIS — L89610 Pressure ulcer of right heel, unstageable: Secondary | ICD-10-CM | POA: Diagnosis not present

## 2023-07-13 DIAGNOSIS — R1310 Dysphagia, unspecified: Secondary | ICD-10-CM | POA: Diagnosis not present

## 2023-07-13 DIAGNOSIS — E1142 Type 2 diabetes mellitus with diabetic polyneuropathy: Secondary | ICD-10-CM | POA: Diagnosis not present

## 2023-07-13 DIAGNOSIS — Z556 Problems related to health literacy: Secondary | ICD-10-CM | POA: Diagnosis not present

## 2023-07-13 DIAGNOSIS — I120 Hypertensive chronic kidney disease with stage 5 chronic kidney disease or end stage renal disease: Secondary | ICD-10-CM | POA: Diagnosis not present

## 2023-07-13 DIAGNOSIS — L89890 Pressure ulcer of other site, unstageable: Secondary | ICD-10-CM | POA: Diagnosis not present

## 2023-07-13 DIAGNOSIS — K7460 Unspecified cirrhosis of liver: Secondary | ICD-10-CM | POA: Diagnosis not present

## 2023-07-13 DIAGNOSIS — E785 Hyperlipidemia, unspecified: Secondary | ICD-10-CM | POA: Diagnosis not present

## 2023-07-13 DIAGNOSIS — E1169 Type 2 diabetes mellitus with other specified complication: Secondary | ICD-10-CM | POA: Diagnosis not present

## 2023-07-15 DIAGNOSIS — D509 Iron deficiency anemia, unspecified: Secondary | ICD-10-CM | POA: Diagnosis not present

## 2023-07-15 DIAGNOSIS — D631 Anemia in chronic kidney disease: Secondary | ICD-10-CM | POA: Diagnosis not present

## 2023-07-15 DIAGNOSIS — N2581 Secondary hyperparathyroidism of renal origin: Secondary | ICD-10-CM | POA: Diagnosis not present

## 2023-07-15 DIAGNOSIS — Z992 Dependence on renal dialysis: Secondary | ICD-10-CM | POA: Diagnosis not present

## 2023-07-15 DIAGNOSIS — N186 End stage renal disease: Secondary | ICD-10-CM | POA: Diagnosis not present

## 2023-07-16 DIAGNOSIS — L89610 Pressure ulcer of right heel, unstageable: Secondary | ICD-10-CM | POA: Diagnosis not present

## 2023-07-16 DIAGNOSIS — I120 Hypertensive chronic kidney disease with stage 5 chronic kidney disease or end stage renal disease: Secondary | ICD-10-CM | POA: Diagnosis not present

## 2023-07-16 DIAGNOSIS — N186 End stage renal disease: Secondary | ICD-10-CM | POA: Diagnosis not present

## 2023-07-16 DIAGNOSIS — S72141D Displaced intertrochanteric fracture of right femur, subsequent encounter for closed fracture with routine healing: Secondary | ICD-10-CM | POA: Diagnosis not present

## 2023-07-16 DIAGNOSIS — L89890 Pressure ulcer of other site, unstageable: Secondary | ICD-10-CM | POA: Diagnosis not present

## 2023-07-16 DIAGNOSIS — E1122 Type 2 diabetes mellitus with diabetic chronic kidney disease: Secondary | ICD-10-CM | POA: Diagnosis not present

## 2023-07-17 DIAGNOSIS — N186 End stage renal disease: Secondary | ICD-10-CM | POA: Diagnosis not present

## 2023-07-17 DIAGNOSIS — Z992 Dependence on renal dialysis: Secondary | ICD-10-CM | POA: Diagnosis not present

## 2023-07-17 DIAGNOSIS — N2581 Secondary hyperparathyroidism of renal origin: Secondary | ICD-10-CM | POA: Diagnosis not present

## 2023-07-17 DIAGNOSIS — D631 Anemia in chronic kidney disease: Secondary | ICD-10-CM | POA: Diagnosis not present

## 2023-07-17 DIAGNOSIS — D509 Iron deficiency anemia, unspecified: Secondary | ICD-10-CM | POA: Diagnosis not present

## 2023-07-19 DIAGNOSIS — Z992 Dependence on renal dialysis: Secondary | ICD-10-CM | POA: Diagnosis not present

## 2023-07-19 DIAGNOSIS — D509 Iron deficiency anemia, unspecified: Secondary | ICD-10-CM | POA: Diagnosis not present

## 2023-07-19 DIAGNOSIS — N2581 Secondary hyperparathyroidism of renal origin: Secondary | ICD-10-CM | POA: Diagnosis not present

## 2023-07-19 DIAGNOSIS — N186 End stage renal disease: Secondary | ICD-10-CM | POA: Diagnosis not present

## 2023-07-19 DIAGNOSIS — D631 Anemia in chronic kidney disease: Secondary | ICD-10-CM | POA: Diagnosis not present

## 2023-07-22 DIAGNOSIS — Z992 Dependence on renal dialysis: Secondary | ICD-10-CM | POA: Diagnosis not present

## 2023-07-22 DIAGNOSIS — D631 Anemia in chronic kidney disease: Secondary | ICD-10-CM | POA: Diagnosis not present

## 2023-07-22 DIAGNOSIS — D509 Iron deficiency anemia, unspecified: Secondary | ICD-10-CM | POA: Diagnosis not present

## 2023-07-22 DIAGNOSIS — N186 End stage renal disease: Secondary | ICD-10-CM | POA: Diagnosis not present

## 2023-07-22 DIAGNOSIS — N2581 Secondary hyperparathyroidism of renal origin: Secondary | ICD-10-CM | POA: Diagnosis not present

## 2023-07-23 DIAGNOSIS — S72141D Displaced intertrochanteric fracture of right femur, subsequent encounter for closed fracture with routine healing: Secondary | ICD-10-CM | POA: Diagnosis not present

## 2023-07-23 DIAGNOSIS — L97522 Non-pressure chronic ulcer of other part of left foot with fat layer exposed: Secondary | ICD-10-CM | POA: Diagnosis not present

## 2023-07-23 DIAGNOSIS — M19071 Primary osteoarthritis, right ankle and foot: Secondary | ICD-10-CM | POA: Diagnosis not present

## 2023-07-23 DIAGNOSIS — N186 End stage renal disease: Secondary | ICD-10-CM | POA: Diagnosis not present

## 2023-07-23 DIAGNOSIS — L89890 Pressure ulcer of other site, unstageable: Secondary | ICD-10-CM | POA: Diagnosis not present

## 2023-07-23 DIAGNOSIS — E1122 Type 2 diabetes mellitus with diabetic chronic kidney disease: Secondary | ICD-10-CM | POA: Diagnosis not present

## 2023-07-23 DIAGNOSIS — L89610 Pressure ulcer of right heel, unstageable: Secondary | ICD-10-CM | POA: Diagnosis not present

## 2023-07-23 DIAGNOSIS — I120 Hypertensive chronic kidney disease with stage 5 chronic kidney disease or end stage renal disease: Secondary | ICD-10-CM | POA: Diagnosis not present

## 2023-07-23 DIAGNOSIS — E11621 Type 2 diabetes mellitus with foot ulcer: Secondary | ICD-10-CM | POA: Diagnosis not present

## 2023-07-24 DIAGNOSIS — Z992 Dependence on renal dialysis: Secondary | ICD-10-CM | POA: Diagnosis not present

## 2023-07-24 DIAGNOSIS — N2581 Secondary hyperparathyroidism of renal origin: Secondary | ICD-10-CM | POA: Diagnosis not present

## 2023-07-24 DIAGNOSIS — D509 Iron deficiency anemia, unspecified: Secondary | ICD-10-CM | POA: Diagnosis not present

## 2023-07-24 DIAGNOSIS — D631 Anemia in chronic kidney disease: Secondary | ICD-10-CM | POA: Diagnosis not present

## 2023-07-24 DIAGNOSIS — N186 End stage renal disease: Secondary | ICD-10-CM | POA: Diagnosis not present

## 2023-07-25 DIAGNOSIS — L89890 Pressure ulcer of other site, unstageable: Secondary | ICD-10-CM | POA: Diagnosis not present

## 2023-07-25 DIAGNOSIS — Z992 Dependence on renal dialysis: Secondary | ICD-10-CM | POA: Diagnosis not present

## 2023-07-25 DIAGNOSIS — D509 Iron deficiency anemia, unspecified: Secondary | ICD-10-CM | POA: Diagnosis not present

## 2023-07-25 DIAGNOSIS — S72141D Displaced intertrochanteric fracture of right femur, subsequent encounter for closed fracture with routine healing: Secondary | ICD-10-CM | POA: Diagnosis not present

## 2023-07-25 DIAGNOSIS — L89610 Pressure ulcer of right heel, unstageable: Secondary | ICD-10-CM | POA: Diagnosis not present

## 2023-07-25 DIAGNOSIS — N186 End stage renal disease: Secondary | ICD-10-CM | POA: Diagnosis not present

## 2023-07-25 DIAGNOSIS — E1122 Type 2 diabetes mellitus with diabetic chronic kidney disease: Secondary | ICD-10-CM | POA: Diagnosis not present

## 2023-07-25 DIAGNOSIS — N2581 Secondary hyperparathyroidism of renal origin: Secondary | ICD-10-CM | POA: Diagnosis not present

## 2023-07-25 DIAGNOSIS — I120 Hypertensive chronic kidney disease with stage 5 chronic kidney disease or end stage renal disease: Secondary | ICD-10-CM | POA: Diagnosis not present

## 2023-07-25 DIAGNOSIS — D631 Anemia in chronic kidney disease: Secondary | ICD-10-CM | POA: Diagnosis not present

## 2023-07-26 DIAGNOSIS — N186 End stage renal disease: Secondary | ICD-10-CM | POA: Diagnosis not present

## 2023-07-26 DIAGNOSIS — D631 Anemia in chronic kidney disease: Secondary | ICD-10-CM | POA: Diagnosis not present

## 2023-07-26 DIAGNOSIS — D509 Iron deficiency anemia, unspecified: Secondary | ICD-10-CM | POA: Diagnosis not present

## 2023-07-26 DIAGNOSIS — N2581 Secondary hyperparathyroidism of renal origin: Secondary | ICD-10-CM | POA: Diagnosis not present

## 2023-07-26 DIAGNOSIS — Z992 Dependence on renal dialysis: Secondary | ICD-10-CM | POA: Diagnosis not present

## 2023-07-29 DIAGNOSIS — N186 End stage renal disease: Secondary | ICD-10-CM | POA: Diagnosis not present

## 2023-07-29 DIAGNOSIS — D631 Anemia in chronic kidney disease: Secondary | ICD-10-CM | POA: Diagnosis not present

## 2023-07-29 DIAGNOSIS — Z992 Dependence on renal dialysis: Secondary | ICD-10-CM | POA: Diagnosis not present

## 2023-07-29 DIAGNOSIS — N2581 Secondary hyperparathyroidism of renal origin: Secondary | ICD-10-CM | POA: Diagnosis not present

## 2023-07-29 DIAGNOSIS — D509 Iron deficiency anemia, unspecified: Secondary | ICD-10-CM | POA: Diagnosis not present

## 2023-07-30 DIAGNOSIS — E11621 Type 2 diabetes mellitus with foot ulcer: Secondary | ICD-10-CM | POA: Diagnosis not present

## 2023-07-30 DIAGNOSIS — L97522 Non-pressure chronic ulcer of other part of left foot with fat layer exposed: Secondary | ICD-10-CM | POA: Diagnosis not present

## 2023-07-30 DIAGNOSIS — L89610 Pressure ulcer of right heel, unstageable: Secondary | ICD-10-CM | POA: Diagnosis not present

## 2023-07-31 DIAGNOSIS — Z992 Dependence on renal dialysis: Secondary | ICD-10-CM | POA: Diagnosis not present

## 2023-07-31 DIAGNOSIS — N186 End stage renal disease: Secondary | ICD-10-CM | POA: Diagnosis not present

## 2023-07-31 DIAGNOSIS — N2581 Secondary hyperparathyroidism of renal origin: Secondary | ICD-10-CM | POA: Diagnosis not present

## 2023-07-31 DIAGNOSIS — D631 Anemia in chronic kidney disease: Secondary | ICD-10-CM | POA: Diagnosis not present

## 2023-07-31 DIAGNOSIS — D509 Iron deficiency anemia, unspecified: Secondary | ICD-10-CM | POA: Diagnosis not present

## 2023-08-01 DIAGNOSIS — S72141D Displaced intertrochanteric fracture of right femur, subsequent encounter for closed fracture with routine healing: Secondary | ICD-10-CM | POA: Diagnosis not present

## 2023-08-01 DIAGNOSIS — L89610 Pressure ulcer of right heel, unstageable: Secondary | ICD-10-CM | POA: Diagnosis not present

## 2023-08-01 DIAGNOSIS — L89890 Pressure ulcer of other site, unstageable: Secondary | ICD-10-CM | POA: Diagnosis not present

## 2023-08-01 DIAGNOSIS — N186 End stage renal disease: Secondary | ICD-10-CM | POA: Diagnosis not present

## 2023-08-01 DIAGNOSIS — E1122 Type 2 diabetes mellitus with diabetic chronic kidney disease: Secondary | ICD-10-CM | POA: Diagnosis not present

## 2023-08-01 DIAGNOSIS — I120 Hypertensive chronic kidney disease with stage 5 chronic kidney disease or end stage renal disease: Secondary | ICD-10-CM | POA: Diagnosis not present

## 2023-08-02 DIAGNOSIS — N2581 Secondary hyperparathyroidism of renal origin: Secondary | ICD-10-CM | POA: Diagnosis not present

## 2023-08-02 DIAGNOSIS — D631 Anemia in chronic kidney disease: Secondary | ICD-10-CM | POA: Diagnosis not present

## 2023-08-02 DIAGNOSIS — N186 End stage renal disease: Secondary | ICD-10-CM | POA: Diagnosis not present

## 2023-08-02 DIAGNOSIS — D509 Iron deficiency anemia, unspecified: Secondary | ICD-10-CM | POA: Diagnosis not present

## 2023-08-02 DIAGNOSIS — Z992 Dependence on renal dialysis: Secondary | ICD-10-CM | POA: Diagnosis not present

## 2023-08-05 DIAGNOSIS — N186 End stage renal disease: Secondary | ICD-10-CM | POA: Diagnosis not present

## 2023-08-05 DIAGNOSIS — D631 Anemia in chronic kidney disease: Secondary | ICD-10-CM | POA: Diagnosis not present

## 2023-08-05 DIAGNOSIS — D509 Iron deficiency anemia, unspecified: Secondary | ICD-10-CM | POA: Diagnosis not present

## 2023-08-05 DIAGNOSIS — N2581 Secondary hyperparathyroidism of renal origin: Secondary | ICD-10-CM | POA: Diagnosis not present

## 2023-08-05 DIAGNOSIS — Z992 Dependence on renal dialysis: Secondary | ICD-10-CM | POA: Diagnosis not present

## 2023-08-07 DIAGNOSIS — D509 Iron deficiency anemia, unspecified: Secondary | ICD-10-CM | POA: Diagnosis not present

## 2023-08-07 DIAGNOSIS — Z992 Dependence on renal dialysis: Secondary | ICD-10-CM | POA: Diagnosis not present

## 2023-08-07 DIAGNOSIS — N186 End stage renal disease: Secondary | ICD-10-CM | POA: Diagnosis not present

## 2023-08-07 DIAGNOSIS — N2581 Secondary hyperparathyroidism of renal origin: Secondary | ICD-10-CM | POA: Diagnosis not present

## 2023-08-07 DIAGNOSIS — E1122 Type 2 diabetes mellitus with diabetic chronic kidney disease: Secondary | ICD-10-CM | POA: Diagnosis not present

## 2023-08-07 DIAGNOSIS — D631 Anemia in chronic kidney disease: Secondary | ICD-10-CM | POA: Diagnosis not present

## 2023-08-08 DIAGNOSIS — E785 Hyperlipidemia, unspecified: Secondary | ICD-10-CM | POA: Diagnosis not present

## 2023-08-08 DIAGNOSIS — E1169 Type 2 diabetes mellitus with other specified complication: Secondary | ICD-10-CM | POA: Diagnosis not present

## 2023-08-08 DIAGNOSIS — S72141D Displaced intertrochanteric fracture of right femur, subsequent encounter for closed fracture with routine healing: Secondary | ICD-10-CM | POA: Diagnosis not present

## 2023-08-08 DIAGNOSIS — E039 Hypothyroidism, unspecified: Secondary | ICD-10-CM | POA: Diagnosis not present

## 2023-08-08 DIAGNOSIS — E1122 Type 2 diabetes mellitus with diabetic chronic kidney disease: Secondary | ICD-10-CM | POA: Diagnosis not present

## 2023-08-08 DIAGNOSIS — Z6822 Body mass index (BMI) 22.0-22.9, adult: Secondary | ICD-10-CM | POA: Diagnosis not present

## 2023-08-08 DIAGNOSIS — L89610 Pressure ulcer of right heel, unstageable: Secondary | ICD-10-CM | POA: Diagnosis not present

## 2023-08-08 DIAGNOSIS — N186 End stage renal disease: Secondary | ICD-10-CM | POA: Diagnosis not present

## 2023-08-08 DIAGNOSIS — I10 Essential (primary) hypertension: Secondary | ICD-10-CM | POA: Diagnosis not present

## 2023-08-08 DIAGNOSIS — I120 Hypertensive chronic kidney disease with stage 5 chronic kidney disease or end stage renal disease: Secondary | ICD-10-CM | POA: Diagnosis not present

## 2023-08-08 DIAGNOSIS — L89890 Pressure ulcer of other site, unstageable: Secondary | ICD-10-CM | POA: Diagnosis not present

## 2023-08-09 DIAGNOSIS — Z992 Dependence on renal dialysis: Secondary | ICD-10-CM | POA: Diagnosis not present

## 2023-08-09 DIAGNOSIS — D631 Anemia in chronic kidney disease: Secondary | ICD-10-CM | POA: Diagnosis not present

## 2023-08-09 DIAGNOSIS — N186 End stage renal disease: Secondary | ICD-10-CM | POA: Diagnosis not present

## 2023-08-09 DIAGNOSIS — N2581 Secondary hyperparathyroidism of renal origin: Secondary | ICD-10-CM | POA: Diagnosis not present

## 2023-08-09 DIAGNOSIS — D509 Iron deficiency anemia, unspecified: Secondary | ICD-10-CM | POA: Diagnosis not present

## 2023-08-12 DIAGNOSIS — E1142 Type 2 diabetes mellitus with diabetic polyneuropathy: Secondary | ICD-10-CM | POA: Diagnosis not present

## 2023-08-12 DIAGNOSIS — L89890 Pressure ulcer of other site, unstageable: Secondary | ICD-10-CM | POA: Diagnosis not present

## 2023-08-12 DIAGNOSIS — Z604 Social exclusion and rejection: Secondary | ICD-10-CM | POA: Diagnosis not present

## 2023-08-12 DIAGNOSIS — Z7901 Long term (current) use of anticoagulants: Secondary | ICD-10-CM | POA: Diagnosis not present

## 2023-08-12 DIAGNOSIS — E1169 Type 2 diabetes mellitus with other specified complication: Secondary | ICD-10-CM | POA: Diagnosis not present

## 2023-08-12 DIAGNOSIS — S72141D Displaced intertrochanteric fracture of right femur, subsequent encounter for closed fracture with routine healing: Secondary | ICD-10-CM | POA: Diagnosis not present

## 2023-08-12 DIAGNOSIS — Z992 Dependence on renal dialysis: Secondary | ICD-10-CM | POA: Diagnosis not present

## 2023-08-12 DIAGNOSIS — L89610 Pressure ulcer of right heel, unstageable: Secondary | ICD-10-CM | POA: Diagnosis not present

## 2023-08-12 DIAGNOSIS — D631 Anemia in chronic kidney disease: Secondary | ICD-10-CM | POA: Diagnosis not present

## 2023-08-12 DIAGNOSIS — K7460 Unspecified cirrhosis of liver: Secondary | ICD-10-CM | POA: Diagnosis not present

## 2023-08-12 DIAGNOSIS — I120 Hypertensive chronic kidney disease with stage 5 chronic kidney disease or end stage renal disease: Secondary | ICD-10-CM | POA: Diagnosis not present

## 2023-08-12 DIAGNOSIS — N186 End stage renal disease: Secondary | ICD-10-CM | POA: Diagnosis not present

## 2023-08-12 DIAGNOSIS — R1310 Dysphagia, unspecified: Secondary | ICD-10-CM | POA: Diagnosis not present

## 2023-08-12 DIAGNOSIS — I69359 Hemiplegia and hemiparesis following cerebral infarction affecting unspecified side: Secondary | ICD-10-CM | POA: Diagnosis not present

## 2023-08-12 DIAGNOSIS — E1122 Type 2 diabetes mellitus with diabetic chronic kidney disease: Secondary | ICD-10-CM | POA: Diagnosis not present

## 2023-08-12 DIAGNOSIS — E785 Hyperlipidemia, unspecified: Secondary | ICD-10-CM | POA: Diagnosis not present

## 2023-08-12 DIAGNOSIS — D509 Iron deficiency anemia, unspecified: Secondary | ICD-10-CM | POA: Diagnosis not present

## 2023-08-12 DIAGNOSIS — R32 Unspecified urinary incontinence: Secondary | ICD-10-CM | POA: Diagnosis not present

## 2023-08-12 DIAGNOSIS — N2581 Secondary hyperparathyroidism of renal origin: Secondary | ICD-10-CM | POA: Diagnosis not present

## 2023-08-12 DIAGNOSIS — Z556 Problems related to health literacy: Secondary | ICD-10-CM | POA: Diagnosis not present

## 2023-08-13 DIAGNOSIS — E11621 Type 2 diabetes mellitus with foot ulcer: Secondary | ICD-10-CM | POA: Diagnosis not present

## 2023-08-13 DIAGNOSIS — L97522 Non-pressure chronic ulcer of other part of left foot with fat layer exposed: Secondary | ICD-10-CM | POA: Diagnosis not present

## 2023-08-13 DIAGNOSIS — L89610 Pressure ulcer of right heel, unstageable: Secondary | ICD-10-CM | POA: Diagnosis not present

## 2023-08-14 DIAGNOSIS — N2581 Secondary hyperparathyroidism of renal origin: Secondary | ICD-10-CM | POA: Diagnosis not present

## 2023-08-14 DIAGNOSIS — Z992 Dependence on renal dialysis: Secondary | ICD-10-CM | POA: Diagnosis not present

## 2023-08-14 DIAGNOSIS — N186 End stage renal disease: Secondary | ICD-10-CM | POA: Diagnosis not present

## 2023-08-14 DIAGNOSIS — D509 Iron deficiency anemia, unspecified: Secondary | ICD-10-CM | POA: Diagnosis not present

## 2023-08-14 DIAGNOSIS — D631 Anemia in chronic kidney disease: Secondary | ICD-10-CM | POA: Diagnosis not present

## 2023-08-15 DIAGNOSIS — E1122 Type 2 diabetes mellitus with diabetic chronic kidney disease: Secondary | ICD-10-CM | POA: Diagnosis not present

## 2023-08-15 DIAGNOSIS — S72141D Displaced intertrochanteric fracture of right femur, subsequent encounter for closed fracture with routine healing: Secondary | ICD-10-CM | POA: Diagnosis not present

## 2023-08-15 DIAGNOSIS — I120 Hypertensive chronic kidney disease with stage 5 chronic kidney disease or end stage renal disease: Secondary | ICD-10-CM | POA: Diagnosis not present

## 2023-08-15 DIAGNOSIS — N186 End stage renal disease: Secondary | ICD-10-CM | POA: Diagnosis not present

## 2023-08-15 DIAGNOSIS — L89890 Pressure ulcer of other site, unstageable: Secondary | ICD-10-CM | POA: Diagnosis not present

## 2023-08-15 DIAGNOSIS — L89610 Pressure ulcer of right heel, unstageable: Secondary | ICD-10-CM | POA: Diagnosis not present

## 2023-08-16 DIAGNOSIS — D509 Iron deficiency anemia, unspecified: Secondary | ICD-10-CM | POA: Diagnosis not present

## 2023-08-16 DIAGNOSIS — Z992 Dependence on renal dialysis: Secondary | ICD-10-CM | POA: Diagnosis not present

## 2023-08-16 DIAGNOSIS — N2581 Secondary hyperparathyroidism of renal origin: Secondary | ICD-10-CM | POA: Diagnosis not present

## 2023-08-16 DIAGNOSIS — D631 Anemia in chronic kidney disease: Secondary | ICD-10-CM | POA: Diagnosis not present

## 2023-08-16 DIAGNOSIS — N186 End stage renal disease: Secondary | ICD-10-CM | POA: Diagnosis not present

## 2023-08-19 DIAGNOSIS — Z992 Dependence on renal dialysis: Secondary | ICD-10-CM | POA: Diagnosis not present

## 2023-08-19 DIAGNOSIS — N2581 Secondary hyperparathyroidism of renal origin: Secondary | ICD-10-CM | POA: Diagnosis not present

## 2023-08-19 DIAGNOSIS — N186 End stage renal disease: Secondary | ICD-10-CM | POA: Diagnosis not present

## 2023-08-19 DIAGNOSIS — D631 Anemia in chronic kidney disease: Secondary | ICD-10-CM | POA: Diagnosis not present

## 2023-08-19 DIAGNOSIS — D509 Iron deficiency anemia, unspecified: Secondary | ICD-10-CM | POA: Diagnosis not present

## 2023-08-21 DIAGNOSIS — Z992 Dependence on renal dialysis: Secondary | ICD-10-CM | POA: Diagnosis not present

## 2023-08-21 DIAGNOSIS — N2581 Secondary hyperparathyroidism of renal origin: Secondary | ICD-10-CM | POA: Diagnosis not present

## 2023-08-21 DIAGNOSIS — N186 End stage renal disease: Secondary | ICD-10-CM | POA: Diagnosis not present

## 2023-08-21 DIAGNOSIS — D509 Iron deficiency anemia, unspecified: Secondary | ICD-10-CM | POA: Diagnosis not present

## 2023-08-21 DIAGNOSIS — D631 Anemia in chronic kidney disease: Secondary | ICD-10-CM | POA: Diagnosis not present

## 2023-08-22 DIAGNOSIS — L89890 Pressure ulcer of other site, unstageable: Secondary | ICD-10-CM | POA: Diagnosis not present

## 2023-08-22 DIAGNOSIS — L89610 Pressure ulcer of right heel, unstageable: Secondary | ICD-10-CM | POA: Diagnosis not present

## 2023-08-22 DIAGNOSIS — N186 End stage renal disease: Secondary | ICD-10-CM | POA: Diagnosis not present

## 2023-08-22 DIAGNOSIS — I120 Hypertensive chronic kidney disease with stage 5 chronic kidney disease or end stage renal disease: Secondary | ICD-10-CM | POA: Diagnosis not present

## 2023-08-22 DIAGNOSIS — E1122 Type 2 diabetes mellitus with diabetic chronic kidney disease: Secondary | ICD-10-CM | POA: Diagnosis not present

## 2023-08-22 DIAGNOSIS — S72141D Displaced intertrochanteric fracture of right femur, subsequent encounter for closed fracture with routine healing: Secondary | ICD-10-CM | POA: Diagnosis not present

## 2023-08-23 DIAGNOSIS — D509 Iron deficiency anemia, unspecified: Secondary | ICD-10-CM | POA: Diagnosis not present

## 2023-08-23 DIAGNOSIS — N186 End stage renal disease: Secondary | ICD-10-CM | POA: Diagnosis not present

## 2023-08-23 DIAGNOSIS — D631 Anemia in chronic kidney disease: Secondary | ICD-10-CM | POA: Diagnosis not present

## 2023-08-23 DIAGNOSIS — N2581 Secondary hyperparathyroidism of renal origin: Secondary | ICD-10-CM | POA: Diagnosis not present

## 2023-08-23 DIAGNOSIS — Z992 Dependence on renal dialysis: Secondary | ICD-10-CM | POA: Diagnosis not present

## 2023-08-24 DIAGNOSIS — I10 Essential (primary) hypertension: Secondary | ICD-10-CM | POA: Diagnosis not present

## 2023-08-25 DIAGNOSIS — E1122 Type 2 diabetes mellitus with diabetic chronic kidney disease: Secondary | ICD-10-CM | POA: Diagnosis not present

## 2023-08-25 DIAGNOSIS — N186 End stage renal disease: Secondary | ICD-10-CM | POA: Diagnosis not present

## 2023-08-25 DIAGNOSIS — Z992 Dependence on renal dialysis: Secondary | ICD-10-CM | POA: Diagnosis not present

## 2023-08-26 DIAGNOSIS — Z992 Dependence on renal dialysis: Secondary | ICD-10-CM | POA: Diagnosis not present

## 2023-08-26 DIAGNOSIS — D509 Iron deficiency anemia, unspecified: Secondary | ICD-10-CM | POA: Diagnosis not present

## 2023-08-26 DIAGNOSIS — D631 Anemia in chronic kidney disease: Secondary | ICD-10-CM | POA: Diagnosis not present

## 2023-08-26 DIAGNOSIS — N186 End stage renal disease: Secondary | ICD-10-CM | POA: Diagnosis not present

## 2023-08-26 DIAGNOSIS — N2581 Secondary hyperparathyroidism of renal origin: Secondary | ICD-10-CM | POA: Diagnosis not present

## 2023-08-28 DIAGNOSIS — D509 Iron deficiency anemia, unspecified: Secondary | ICD-10-CM | POA: Diagnosis not present

## 2023-08-28 DIAGNOSIS — D631 Anemia in chronic kidney disease: Secondary | ICD-10-CM | POA: Diagnosis not present

## 2023-08-28 DIAGNOSIS — N2581 Secondary hyperparathyroidism of renal origin: Secondary | ICD-10-CM | POA: Diagnosis not present

## 2023-08-28 DIAGNOSIS — Z992 Dependence on renal dialysis: Secondary | ICD-10-CM | POA: Diagnosis not present

## 2023-08-28 DIAGNOSIS — N186 End stage renal disease: Secondary | ICD-10-CM | POA: Diagnosis not present

## 2023-08-30 DIAGNOSIS — Z992 Dependence on renal dialysis: Secondary | ICD-10-CM | POA: Diagnosis not present

## 2023-08-30 DIAGNOSIS — D631 Anemia in chronic kidney disease: Secondary | ICD-10-CM | POA: Diagnosis not present

## 2023-08-30 DIAGNOSIS — D509 Iron deficiency anemia, unspecified: Secondary | ICD-10-CM | POA: Diagnosis not present

## 2023-08-30 DIAGNOSIS — N186 End stage renal disease: Secondary | ICD-10-CM | POA: Diagnosis not present

## 2023-08-30 DIAGNOSIS — N2581 Secondary hyperparathyroidism of renal origin: Secondary | ICD-10-CM | POA: Diagnosis not present

## 2023-08-31 DIAGNOSIS — E1122 Type 2 diabetes mellitus with diabetic chronic kidney disease: Secondary | ICD-10-CM | POA: Diagnosis not present

## 2023-08-31 DIAGNOSIS — S72141D Displaced intertrochanteric fracture of right femur, subsequent encounter for closed fracture with routine healing: Secondary | ICD-10-CM | POA: Diagnosis not present

## 2023-08-31 DIAGNOSIS — L89610 Pressure ulcer of right heel, unstageable: Secondary | ICD-10-CM | POA: Diagnosis not present

## 2023-08-31 DIAGNOSIS — N186 End stage renal disease: Secondary | ICD-10-CM | POA: Diagnosis not present

## 2023-08-31 DIAGNOSIS — L89890 Pressure ulcer of other site, unstageable: Secondary | ICD-10-CM | POA: Diagnosis not present

## 2023-08-31 DIAGNOSIS — I120 Hypertensive chronic kidney disease with stage 5 chronic kidney disease or end stage renal disease: Secondary | ICD-10-CM | POA: Diagnosis not present

## 2023-09-02 DIAGNOSIS — D631 Anemia in chronic kidney disease: Secondary | ICD-10-CM | POA: Diagnosis not present

## 2023-09-02 DIAGNOSIS — N186 End stage renal disease: Secondary | ICD-10-CM | POA: Diagnosis not present

## 2023-09-02 DIAGNOSIS — D509 Iron deficiency anemia, unspecified: Secondary | ICD-10-CM | POA: Diagnosis not present

## 2023-09-02 DIAGNOSIS — Z992 Dependence on renal dialysis: Secondary | ICD-10-CM | POA: Diagnosis not present

## 2023-09-02 DIAGNOSIS — N2581 Secondary hyperparathyroidism of renal origin: Secondary | ICD-10-CM | POA: Diagnosis not present

## 2023-09-03 DIAGNOSIS — L89890 Pressure ulcer of other site, unstageable: Secondary | ICD-10-CM | POA: Diagnosis not present

## 2023-09-03 DIAGNOSIS — S72141D Displaced intertrochanteric fracture of right femur, subsequent encounter for closed fracture with routine healing: Secondary | ICD-10-CM | POA: Diagnosis not present

## 2023-09-03 DIAGNOSIS — E1122 Type 2 diabetes mellitus with diabetic chronic kidney disease: Secondary | ICD-10-CM | POA: Diagnosis not present

## 2023-09-03 DIAGNOSIS — L89610 Pressure ulcer of right heel, unstageable: Secondary | ICD-10-CM | POA: Diagnosis not present

## 2023-09-03 DIAGNOSIS — I120 Hypertensive chronic kidney disease with stage 5 chronic kidney disease or end stage renal disease: Secondary | ICD-10-CM | POA: Diagnosis not present

## 2023-09-03 DIAGNOSIS — N186 End stage renal disease: Secondary | ICD-10-CM | POA: Diagnosis not present

## 2023-09-04 DIAGNOSIS — N186 End stage renal disease: Secondary | ICD-10-CM | POA: Diagnosis not present

## 2023-09-04 DIAGNOSIS — D509 Iron deficiency anemia, unspecified: Secondary | ICD-10-CM | POA: Diagnosis not present

## 2023-09-04 DIAGNOSIS — N2581 Secondary hyperparathyroidism of renal origin: Secondary | ICD-10-CM | POA: Diagnosis not present

## 2023-09-04 DIAGNOSIS — Z992 Dependence on renal dialysis: Secondary | ICD-10-CM | POA: Diagnosis not present

## 2023-09-04 DIAGNOSIS — D631 Anemia in chronic kidney disease: Secondary | ICD-10-CM | POA: Diagnosis not present

## 2023-09-05 ENCOUNTER — Other Ambulatory Visit: Payer: Self-pay

## 2023-09-05 ENCOUNTER — Encounter (HOSPITAL_COMMUNITY)
Admission: RE | Disposition: A | Discharge status: Home / Self Care | Payer: Self-pay | Source: Home / Self Care | Attending: Surgery

## 2023-09-05 ENCOUNTER — Ambulatory Visit (HOSPITAL_COMMUNITY)
Admission: RE | Admit: 2023-09-05 | Discharge: 2023-09-05 | Disposition: A | Discharge status: Home / Self Care | Attending: Surgery | Admitting: Surgery

## 2023-09-05 DIAGNOSIS — Y832 Surgical operation with anastomosis, bypass or graft as the cause of abnormal reaction of the patient, or of later complication, without mention of misadventure at the time of the procedure: Secondary | ICD-10-CM | POA: Diagnosis not present

## 2023-09-05 DIAGNOSIS — Z87891 Personal history of nicotine dependence: Secondary | ICD-10-CM | POA: Diagnosis not present

## 2023-09-05 DIAGNOSIS — T82858A Stenosis of vascular prosthetic devices, implants and grafts, initial encounter: Secondary | ICD-10-CM | POA: Diagnosis not present

## 2023-09-05 DIAGNOSIS — I120 Hypertensive chronic kidney disease with stage 5 chronic kidney disease or end stage renal disease: Secondary | ICD-10-CM | POA: Diagnosis not present

## 2023-09-05 DIAGNOSIS — T82898A Other specified complication of vascular prosthetic devices, implants and grafts, initial encounter: Secondary | ICD-10-CM | POA: Diagnosis not present

## 2023-09-05 DIAGNOSIS — L89610 Pressure ulcer of right heel, unstageable: Secondary | ICD-10-CM | POA: Diagnosis not present

## 2023-09-05 DIAGNOSIS — E1122 Type 2 diabetes mellitus with diabetic chronic kidney disease: Secondary | ICD-10-CM | POA: Insufficient documentation

## 2023-09-05 DIAGNOSIS — Z992 Dependence on renal dialysis: Secondary | ICD-10-CM

## 2023-09-05 DIAGNOSIS — N186 End stage renal disease: Secondary | ICD-10-CM

## 2023-09-05 HISTORY — PX: A/V SHUNT INTERVENTION: CATH118220

## 2023-09-05 HISTORY — PX: VENOUS ANGIOPLASTY: CATH118376

## 2023-09-05 LAB — GLUCOSE, CAPILLARY: Glucose-Capillary: 121 mg/dL — ABNORMAL HIGH (ref 70–99)

## 2023-09-05 SURGERY — A/V SHUNT INTERVENTION
Anesthesia: LOCAL

## 2023-09-05 MED ORDER — LIDOCAINE HCL (PF) 1 % IJ SOLN
INTRAMUSCULAR | Status: DC | PRN
  Administered 2023-09-05: 2 mL via SUBCUTANEOUS

## 2023-09-05 MED ORDER — HEPARIN (PORCINE) IN NACL 1000-0.9 UT/500ML-% IV SOLN
INTRAVENOUS | Status: DC | PRN
  Administered 2023-09-05: 500 mL

## 2023-09-05 MED ORDER — IODIXANOL 320 MG/ML IV SOLN
INTRAVENOUS | Status: DC | PRN
  Administered 2023-09-05: 20 mL via INTRAVENOUS

## 2023-09-05 MED ORDER — LIDOCAINE HCL (PF) 1 % IJ SOLN
INTRAMUSCULAR | Status: AC
  Filled 2023-09-05: qty 30

## 2023-09-05 SURGICAL SUPPLY — 10 items
BALLOON ATHLETIS 6X40X75 (BALLOONS) IMPLANT
BALLOON MUSTANG 7X80X75 (BALLOONS) IMPLANT
KIT ENCORE 26 ADVANTAGE (KITS) IMPLANT
KIT MICROPUNCTURE NIT STIFF (SHEATH) IMPLANT
SHEATH PINNACLE R/O II 6F 4CM (SHEATH) IMPLANT
SHEATH PROBE COVER 6X72 (BAG) IMPLANT
STOPCOCK MORSE 400PSI 3WAY (MISCELLANEOUS) IMPLANT
TRAY PV CATH (CUSTOM PROCEDURE TRAY) ×2 IMPLANT
TUBING CIL FLEX 10 FLL-RA (TUBING) IMPLANT
WIRE BENTSON .035X145CM (WIRE) IMPLANT

## 2023-09-05 NOTE — H&P (Signed)
 [facility] [place]  Patient name: Francisco Baldwin MRN: [id number] DOB: 14-Aug-1955 Sex: male   REASON FOR VISIT:    Dialysis access problems  HISOTRY OF PRESENT ILLNESS:    Francisco Baldwin is a 68 y.o. male who is status post left basilic vein fistula on 12-06-2023.  He is having difficulties with access.  Spanish interpreter is here with the family.  He has not had any prior interventions.  Patient suffers from diabetes.  He is medically managed for hypertension.   PAST MEDICAL HISTORY:   Past Medical History:  Diagnosis Date   Acute respiratory failure with hypercapnia (HCC)    Anemia in other chronic diseases classified elsewhere    Anxiety    Cerebral infarction (HCC)    Chronic kidney disease    Dependence on renal dialysis (HCC)    Depression    Diabetes mellitus without complication (HCC)    Diverticulitis large intestine w/o perforation or abscess w/o bleeding    Dysphagia    End stage renal disease (HCC)    Essential (primary) hypertension    GERD (gastroesophageal reflux disease)    Hyperlipidemia    Hypothyroidism    Ileostomy status (HCC)    Metabolic encephalopathy    Personal history of other venous thrombosis and embolism    Presence of other vascular implants and grafts    Radiculopathy    Lumbar region   Thyroid  disease    Type 2 diabetes mellitus with diabetic chronic kidney disease (HCC)      FAMILY HISTORY:   Family History  Problem Relation Age of Onset   Diabetes Mother    Hypertension Mother    Diabetes Father     SOCIAL HISTORY:   Social History   Tobacco Use   Smoking status: Former    Types: Cigarettes   Smokeless tobacco: Never  Substance Use Topics   Alcohol  use: Not Currently     ALLERGIES:   Allergies  Allergen Reactions   Penicillins Rash    Mild cutaneous reaction without swelling or trouble breathing. Likely reasonable to give cephalexin for  pre-transplant prophylaxis.       CURRENT MEDICATIONS:   No current facility-administered medications for this encounter.    REVIEW OF SYSTEMS:   [X]  denotes positive finding, [ ]  denotes negative finding Cardiac  Comments:  Chest pain or chest pressure:    Shortness of breath upon exertion:    Short of breath when lying flat:    Irregular heart rhythm:        Vascular    Pain in calf, thigh, or hip brought on by ambulation:    Pain in feet at night that wakes you up from your sleep:     Blood clot in your veins:    Leg swelling:         Pulmonary    Oxygen at home:    Productive cough:     Wheezing:         Neurologic    Sudden weakness in arms or legs:     Sudden numbness in arms or legs:     Sudden onset of difficulty speaking or slurred speech:    Temporary loss of vision in one eye:     Problems with dizziness:         Gastrointestinal    Blood in stool:     Vomited blood:         Genitourinary    Burning when urinating:  Blood in urine:        Psychiatric    Major depression:         Hematologic    Bleeding problems:    Problems with blood clotting too easily:        Skin    Rashes or ulcers:        Constitutional    Fever or chills:      PHYSICAL EXAM:   Vitals:   09/05/23 1017  BP: (!) 167/63  Pulse: 71  Resp: 12  Temp: 98.1 F (36.7 C)  SpO2: 99%    GENERAL: The patient is a well-nourished male, in no acute distress. The vital signs are documented above. CARDIAC: There is a regular rate and rhythm.  VASCULAR: Excellent thrill within the fistula PULMONARY: Non-labored respirations ABDOMEN: Soft and non-tender with normal pitched bowel sounds.  NEUROLOGIC: No focal weakness or paresthesias are detected. SKIN: There are no ulcers or rashes noted. PSYCHIATRIC: The patient has a normal affect.  STUDIES:     MEDICAL ISSUES:   ESRD:  I discussed proceeding with a left arm fistulogram and intervention as indicated.  All  questions answered with the assistance of Spanish interpreter    Marti Slates, MD, FACS [facility] [phone] Pager [phone]

## 2023-09-05 NOTE — Op Note (Signed)
    Patient name: Francisco Baldwin MRN: [id number] DOB: Aug 05, 1955 Sex: male  09/05/2023 Pre-operative Diagnosis: ESRD Post-operative diagnosis:  Same Surgeon:  Gareld June Procedure Performed:  1.  Ultrasound-guided access, left basilic vein  2.  Fistulogram  3.  Peripheral venoplasty (left basilic vein)   Indications: This is a 68 year old gentleman who is having trouble with his dialysis runs.  He is here for fistulogram  Procedure:  The patient was identified in the holding area and taken to room 8.  The patient was then placed supine on the table and prepped and draped in the usual sterile fashion.  A time out was called.  Ultrasound was used to evaluate the fistula.  The vein was patent and compressible.  A digital ultrasound image was acquired.  The fistula was then accessed under ultrasound guidance using a micropuncture needle.  An 018 wire was then asvanced without resistance and a micropuncture sheath was placed.  Contrast injections were then performed through the sheath.  Findings: Central venous system is widely patent.  The arteriovenous anastomosis is widely patent.  In the midportion of the fistula, there is a 2 cm in length of 80%   Intervention: After the above's were acquired the decision was made to proceed with intervention.  Over a Bentson wire a 6 French sheath was placed.  I first selected a 7 x 80 Mustang balloon and treated the lesion as well as the more proximal fistula towards the arteriovenous anastomosis.  The balloon was taken to 20 atm however there is still a small waist at the area of stenosis.  I then switched out to a 6 x 40 Athletics balloon and repeated balloon venoplasty taking this again to 28 atm.  Completion imaging showed a persistent stenosis.  At this point I was concerned that a larger balloon would lead to fistula rupture.  Since we have made a improvement with the stenosis less than 30% I elected to observe this.  Catheters and wires were removed.   The sheath was withdrawn and a 4-0 Monocryl was used for closure  Impression:  #1  No evidence of central venous stenosis or stenosis at the arteriovenous anastomosis  #2  80% stenosis in the midportion of the fistula.  This lesion was about 2 cm in length and resistant balloon venoplasty.  I used a 7 mm Mustang balloon and a 6 mm Athletics balloon.  I did not use a larger balloon due to concerns over possible rupture of the fistula.  #3  The patient has early recurrence, he would benefit either from surgical revision or stenting   V. Gareld June, M.D., [facility] [place] Office: [phone] Pager:  [phone]

## 2023-09-06 ENCOUNTER — Encounter (HOSPITAL_COMMUNITY): Payer: Self-pay | Admitting: Surgery

## 2023-09-06 DIAGNOSIS — Z992 Dependence on renal dialysis: Secondary | ICD-10-CM | POA: Diagnosis not present

## 2023-09-06 DIAGNOSIS — N186 End stage renal disease: Secondary | ICD-10-CM | POA: Diagnosis not present

## 2023-09-06 DIAGNOSIS — D509 Iron deficiency anemia, unspecified: Secondary | ICD-10-CM | POA: Diagnosis not present

## 2023-09-06 DIAGNOSIS — D631 Anemia in chronic kidney disease: Secondary | ICD-10-CM | POA: Diagnosis not present

## 2023-09-06 DIAGNOSIS — N2581 Secondary hyperparathyroidism of renal origin: Secondary | ICD-10-CM | POA: Diagnosis not present

## 2023-09-08 DIAGNOSIS — E08621 Diabetes mellitus due to underlying condition with foot ulcer: Secondary | ICD-10-CM | POA: Diagnosis not present

## 2023-09-08 DIAGNOSIS — E1122 Type 2 diabetes mellitus with diabetic chronic kidney disease: Secondary | ICD-10-CM | POA: Diagnosis not present

## 2023-09-08 DIAGNOSIS — I120 Hypertensive chronic kidney disease with stage 5 chronic kidney disease or end stage renal disease: Secondary | ICD-10-CM | POA: Diagnosis not present

## 2023-09-08 DIAGNOSIS — N186 End stage renal disease: Secondary | ICD-10-CM | POA: Diagnosis not present

## 2023-09-08 DIAGNOSIS — E11621 Type 2 diabetes mellitus with foot ulcer: Secondary | ICD-10-CM | POA: Diagnosis not present

## 2023-09-08 DIAGNOSIS — L97419 Non-pressure chronic ulcer of right heel and midfoot with unspecified severity: Secondary | ICD-10-CM | POA: Diagnosis not present

## 2023-09-09 DIAGNOSIS — Z992 Dependence on renal dialysis: Secondary | ICD-10-CM | POA: Diagnosis not present

## 2023-09-09 DIAGNOSIS — I120 Hypertensive chronic kidney disease with stage 5 chronic kidney disease or end stage renal disease: Secondary | ICD-10-CM | POA: Diagnosis not present

## 2023-09-09 DIAGNOSIS — E1122 Type 2 diabetes mellitus with diabetic chronic kidney disease: Secondary | ICD-10-CM | POA: Diagnosis not present

## 2023-09-09 DIAGNOSIS — L97419 Non-pressure chronic ulcer of right heel and midfoot with unspecified severity: Secondary | ICD-10-CM | POA: Diagnosis not present

## 2023-09-09 DIAGNOSIS — N2581 Secondary hyperparathyroidism of renal origin: Secondary | ICD-10-CM | POA: Diagnosis not present

## 2023-09-09 DIAGNOSIS — E08621 Diabetes mellitus due to underlying condition with foot ulcer: Secondary | ICD-10-CM | POA: Diagnosis not present

## 2023-09-09 DIAGNOSIS — D631 Anemia in chronic kidney disease: Secondary | ICD-10-CM | POA: Diagnosis not present

## 2023-09-09 DIAGNOSIS — N186 End stage renal disease: Secondary | ICD-10-CM | POA: Diagnosis not present

## 2023-09-09 DIAGNOSIS — D509 Iron deficiency anemia, unspecified: Secondary | ICD-10-CM | POA: Diagnosis not present

## 2023-09-10 DIAGNOSIS — N186 End stage renal disease: Secondary | ICD-10-CM | POA: Diagnosis not present

## 2023-09-10 DIAGNOSIS — L89610 Pressure ulcer of right heel, unstageable: Secondary | ICD-10-CM | POA: Diagnosis not present

## 2023-09-10 DIAGNOSIS — S72141D Displaced intertrochanteric fracture of right femur, subsequent encounter for closed fracture with routine healing: Secondary | ICD-10-CM | POA: Diagnosis not present

## 2023-09-10 DIAGNOSIS — E1122 Type 2 diabetes mellitus with diabetic chronic kidney disease: Secondary | ICD-10-CM | POA: Diagnosis not present

## 2023-09-10 DIAGNOSIS — L89890 Pressure ulcer of other site, unstageable: Secondary | ICD-10-CM | POA: Diagnosis not present

## 2023-09-10 DIAGNOSIS — S91301A Unspecified open wound, right foot, initial encounter: Secondary | ICD-10-CM | POA: Diagnosis not present

## 2023-09-10 DIAGNOSIS — I120 Hypertensive chronic kidney disease with stage 5 chronic kidney disease or end stage renal disease: Secondary | ICD-10-CM | POA: Diagnosis not present

## 2023-09-11 DIAGNOSIS — S72141D Displaced intertrochanteric fracture of right femur, subsequent encounter for closed fracture with routine healing: Secondary | ICD-10-CM | POA: Diagnosis not present

## 2023-09-11 DIAGNOSIS — D631 Anemia in chronic kidney disease: Secondary | ICD-10-CM | POA: Diagnosis not present

## 2023-09-11 DIAGNOSIS — Z604 Social exclusion and rejection: Secondary | ICD-10-CM | POA: Diagnosis not present

## 2023-09-11 DIAGNOSIS — N186 End stage renal disease: Secondary | ICD-10-CM | POA: Diagnosis not present

## 2023-09-11 DIAGNOSIS — E785 Hyperlipidemia, unspecified: Secondary | ICD-10-CM | POA: Diagnosis not present

## 2023-09-11 DIAGNOSIS — Z992 Dependence on renal dialysis: Secondary | ICD-10-CM | POA: Diagnosis not present

## 2023-09-11 DIAGNOSIS — L89890 Pressure ulcer of other site, unstageable: Secondary | ICD-10-CM | POA: Diagnosis not present

## 2023-09-11 DIAGNOSIS — L89610 Pressure ulcer of right heel, unstageable: Secondary | ICD-10-CM | POA: Diagnosis not present

## 2023-09-11 DIAGNOSIS — N2581 Secondary hyperparathyroidism of renal origin: Secondary | ICD-10-CM | POA: Diagnosis not present

## 2023-09-11 DIAGNOSIS — I69359 Hemiplegia and hemiparesis following cerebral infarction affecting unspecified side: Secondary | ICD-10-CM | POA: Diagnosis not present

## 2023-09-11 DIAGNOSIS — D509 Iron deficiency anemia, unspecified: Secondary | ICD-10-CM | POA: Diagnosis not present

## 2023-09-11 DIAGNOSIS — Z7901 Long term (current) use of anticoagulants: Secondary | ICD-10-CM | POA: Diagnosis not present

## 2023-09-11 DIAGNOSIS — K7460 Unspecified cirrhosis of liver: Secondary | ICD-10-CM | POA: Diagnosis not present

## 2023-09-11 DIAGNOSIS — R32 Unspecified urinary incontinence: Secondary | ICD-10-CM | POA: Diagnosis not present

## 2023-09-11 DIAGNOSIS — E1169 Type 2 diabetes mellitus with other specified complication: Secondary | ICD-10-CM | POA: Diagnosis not present

## 2023-09-11 DIAGNOSIS — I120 Hypertensive chronic kidney disease with stage 5 chronic kidney disease or end stage renal disease: Secondary | ICD-10-CM | POA: Diagnosis not present

## 2023-09-11 DIAGNOSIS — E1142 Type 2 diabetes mellitus with diabetic polyneuropathy: Secondary | ICD-10-CM | POA: Diagnosis not present

## 2023-09-11 DIAGNOSIS — Z556 Problems related to health literacy: Secondary | ICD-10-CM | POA: Diagnosis not present

## 2023-09-11 DIAGNOSIS — R1310 Dysphagia, unspecified: Secondary | ICD-10-CM | POA: Diagnosis not present

## 2023-09-11 DIAGNOSIS — E1122 Type 2 diabetes mellitus with diabetic chronic kidney disease: Secondary | ICD-10-CM | POA: Diagnosis not present

## 2023-09-12 DIAGNOSIS — L89610 Pressure ulcer of right heel, unstageable: Secondary | ICD-10-CM | POA: Diagnosis not present

## 2023-09-13 DIAGNOSIS — N186 End stage renal disease: Secondary | ICD-10-CM | POA: Diagnosis not present

## 2023-09-13 DIAGNOSIS — Z992 Dependence on renal dialysis: Secondary | ICD-10-CM | POA: Diagnosis not present

## 2023-09-13 DIAGNOSIS — N2581 Secondary hyperparathyroidism of renal origin: Secondary | ICD-10-CM | POA: Diagnosis not present

## 2023-09-13 DIAGNOSIS — D509 Iron deficiency anemia, unspecified: Secondary | ICD-10-CM | POA: Diagnosis not present

## 2023-09-13 DIAGNOSIS — D631 Anemia in chronic kidney disease: Secondary | ICD-10-CM | POA: Diagnosis not present

## 2023-09-16 DIAGNOSIS — N2581 Secondary hyperparathyroidism of renal origin: Secondary | ICD-10-CM | POA: Diagnosis not present

## 2023-09-16 DIAGNOSIS — D631 Anemia in chronic kidney disease: Secondary | ICD-10-CM | POA: Diagnosis not present

## 2023-09-16 DIAGNOSIS — D509 Iron deficiency anemia, unspecified: Secondary | ICD-10-CM | POA: Diagnosis not present

## 2023-09-16 DIAGNOSIS — N186 End stage renal disease: Secondary | ICD-10-CM | POA: Diagnosis not present

## 2023-09-16 DIAGNOSIS — Z992 Dependence on renal dialysis: Secondary | ICD-10-CM | POA: Diagnosis not present

## 2023-09-17 DIAGNOSIS — L89610 Pressure ulcer of right heel, unstageable: Secondary | ICD-10-CM | POA: Diagnosis not present

## 2023-09-17 DIAGNOSIS — E1122 Type 2 diabetes mellitus with diabetic chronic kidney disease: Secondary | ICD-10-CM | POA: Diagnosis not present

## 2023-09-17 DIAGNOSIS — S72141D Displaced intertrochanteric fracture of right femur, subsequent encounter for closed fracture with routine healing: Secondary | ICD-10-CM | POA: Diagnosis not present

## 2023-09-17 DIAGNOSIS — I120 Hypertensive chronic kidney disease with stage 5 chronic kidney disease or end stage renal disease: Secondary | ICD-10-CM | POA: Diagnosis not present

## 2023-09-17 DIAGNOSIS — N186 End stage renal disease: Secondary | ICD-10-CM | POA: Diagnosis not present

## 2023-09-17 DIAGNOSIS — L89890 Pressure ulcer of other site, unstageable: Secondary | ICD-10-CM | POA: Diagnosis not present

## 2023-09-18 DIAGNOSIS — N186 End stage renal disease: Secondary | ICD-10-CM | POA: Diagnosis not present

## 2023-09-18 DIAGNOSIS — D631 Anemia in chronic kidney disease: Secondary | ICD-10-CM | POA: Diagnosis not present

## 2023-09-18 DIAGNOSIS — Z992 Dependence on renal dialysis: Secondary | ICD-10-CM | POA: Diagnosis not present

## 2023-09-18 DIAGNOSIS — N2581 Secondary hyperparathyroidism of renal origin: Secondary | ICD-10-CM | POA: Diagnosis not present

## 2023-09-18 DIAGNOSIS — D509 Iron deficiency anemia, unspecified: Secondary | ICD-10-CM | POA: Diagnosis not present

## 2023-09-19 ENCOUNTER — Telehealth: Payer: Self-pay

## 2023-09-19 DIAGNOSIS — L89610 Pressure ulcer of right heel, unstageable: Secondary | ICD-10-CM | POA: Diagnosis not present

## 2023-09-19 NOTE — Telephone Encounter (Signed)
   Pre-operative Risk Assessment    Patient Name: Francisco Baldwin  DOB: 17-Jul-1955 MRN: [id number]   Date of last office visit: 12/28/22 DELON HOOVER, NP Date of next office visit: NONE   Request for Surgical Clearance    Procedure:  DEBRIDMENT RIGHT HEEL WOUND  Date of Surgery:  Clearance 09/24/23                                Surgeon:  DR ALLANA Surgeon's Group or Practice Name:  [facility] Phone number:  [phone] Fax number:  [phone]   Type of Clearance Requested:   - Medical  - Pharmacy:  Hold Apixaban  (Eliquis )     Type of Anesthesia:  General    Additional requests/questions:    SignedLucie DELENA Ku   09/19/2023, 5:42 PM

## 2023-09-20 ENCOUNTER — Ambulatory Visit: Attending: Cardiology

## 2023-09-20 ENCOUNTER — Telehealth: Payer: Self-pay

## 2023-09-20 DIAGNOSIS — N186 End stage renal disease: Secondary | ICD-10-CM | POA: Diagnosis not present

## 2023-09-20 DIAGNOSIS — Z01810 Encounter for preprocedural cardiovascular examination: Secondary | ICD-10-CM

## 2023-09-20 DIAGNOSIS — D509 Iron deficiency anemia, unspecified: Secondary | ICD-10-CM | POA: Diagnosis not present

## 2023-09-20 DIAGNOSIS — Z992 Dependence on renal dialysis: Secondary | ICD-10-CM | POA: Diagnosis not present

## 2023-09-20 DIAGNOSIS — D631 Anemia in chronic kidney disease: Secondary | ICD-10-CM | POA: Diagnosis not present

## 2023-09-20 DIAGNOSIS — N2581 Secondary hyperparathyroidism of renal origin: Secondary | ICD-10-CM | POA: Diagnosis not present

## 2023-09-20 NOTE — Telephone Encounter (Signed)
 Calling patient to schedule televisit patient was in dialysis but son Steffan scheduled televisit which was approved by preop APP Katlyn West, NP for today at 4pm

## 2023-09-20 NOTE — Telephone Encounter (Signed)
 Patient on Eliquis  for DVT. Prescribed by PCP. Will defer to primary.

## 2023-09-20 NOTE — Telephone Encounter (Signed)
   Name: Francisco Baldwin  DOB: 1955-10-12  MRN: [id number]  Primary Cardiologist: Lamar Fitch, MD   Preoperative team, please contact this patient and set up a phone call appointment for further preoperative risk assessment. Last seen by Delon Hoover on 01/17/2023 Please obtain consent and complete medication review. Thank you for your help.  I confirm that guidance regarding antiplatelet and oral anticoagulation therapy has been completed and, if necessary, noted below.Pharmacy to complete recommendations on Eliquis    I also confirmed the patient resides in the state of [place] . As per [facility] telemedicine laws, the patient must reside in the state in which the provider is licensed.   Lamarr Satterfield, NP 09/20/2023, 1:21 PM [facility]

## 2023-09-20 NOTE — Telephone Encounter (Signed)
 Please contact surgeon to advise that Eliquis  is managed by PCP.  They will need to make recommendations for holding. Still needs VV with cardiology. Thank you!

## 2023-09-20 NOTE — Telephone Encounter (Signed)
 Tried calling requesting office to make them aware Eliquis  is managed by PCP office is closed for the day and cannot leave vm

## 2023-09-20 NOTE — Telephone Encounter (Signed)
 Patient has been scheduled consent done     Patient Consent for Virtual Visit         VERNEL LANGENDERFER has provided verbal consent on 09/20/2023 for a virtual visit (video or telephone).   CONSENT FOR VIRTUAL VISIT FOR:  Raynell LULLA Skelton  By participating in this virtual visit I agree to the following:  I hereby voluntarily request, consent and authorize [facility] and its employed or contracted physicians, physician assistants, nurse practitioners or other licensed health care professionals (the Practitioner), to provide me with telemedicine health care services (the "Services) as deemed necessary by the treating Practitioner. I acknowledge and consent to receive the Services by the Practitioner via telemedicine. I understand that the telemedicine visit will involve communicating with the Practitioner through live audiovisual communication technology and the disclosure of certain medical information by electronic transmission. I acknowledge that I have been given the opportunity to request an in-person assessment or other available alternative prior to the telemedicine visit and am voluntarily participating in the telemedicine visit.  I understand that I have the right to withhold or withdraw my consent to the use of telemedicine in the course of my care at any time, without affecting my right to future care or treatment, and that the Practitioner or I may terminate the telemedicine visit at any time. I understand that I have the right to inspect all information obtained and/or recorded in the course of the telemedicine visit and may receive copies of available information for a reasonable fee.  I understand that some of the potential risks of receiving the Services via telemedicine include:  Delay or interruption in medical evaluation due to technological equipment failure or disruption; Information transmitted may not be sufficient (e.g. poor resolution of images) to allow for  appropriate medical decision making by the Practitioner; and/or  In rare instances, security protocols could fail, causing a breach of personal health information.  Furthermore, I acknowledge that it is my responsibility to provide information about my medical history, conditions and care that is complete and accurate to the best of my ability. I acknowledge that Practitioner's advice, recommendations, and/or decision may be based on factors not within their control, such as incomplete or inaccurate data provided by me or distortions of diagnostic images or specimens that may result from electronic transmissions. I understand that the practice of medicine is not an exact science and that Practitioner makes no warranties or guarantees regarding treatment outcomes. I acknowledge that a copy of this consent can be made available to me via my patient portal [facility] MyChart), or I can request a printed copy by calling the office of [facility].    I understand that my insurance will be billed for this visit.   I have read or had this consent read to me. I understand the contents of this consent, which adequately explains the benefits and risks of the Services being provided via telemedicine.  I have been provided ample opportunity to ask questions regarding this consent and the Services and have had my questions answered to my satisfaction. I give my informed consent for the services to be provided through the use of telemedicine in my medical care

## 2023-09-20 NOTE — Telephone Encounter (Signed)
 Pharmacy please advise on holding Eliquis  prior to  DEBRIDMENT RIGHT HEEL WOUND  scheduled for 09/24/2023. Thank you.

## 2023-09-20 NOTE — Progress Notes (Signed)
 Virtual Visit via Telephone Note   Because of [name] co-morbid illnesses, he is at least at moderate risk for complications without adequate follow up.  This format is felt to be most appropriate for this patient at this time.  Due to technical limitations with video connection Web designer), today's appointment will be conducted as an audio only telehealth visit, and Francisco Baldwin verbally agreed to proceed in this manner.   All issues noted in this document were discussed and addressed.  No physical exam could be performed with this format.  Evaluation Performed:  Preoperative cardiovascular risk assessment _____________   Date:  09/20/2023   Patient ID:  Francisco Baldwin, DOB 06-04-1955, MRN [id number] Patient Location:  Home Provider location:   Office  Primary Care Provider:  Melonie Colonel, Mikel HERO, MD Primary Cardiologist:  Lamar Fitch, MD  Chief Complaint / Patient Profile  68 y.o. y/o male with a h/o hypertension, stroke, hepatic cirrhosis, type 2 diabetes mellitus, ESRD on HD, dyslipidemia, history of DVT on Eliquis  who is pending debridement of right heel wound and presents today for telephonic preoperative cardiovascular risk assessment.  A [employer] line interpreter was utilized for Francisco Baldwin, interpreter [phone] assisted. History of Present Illness  Francisco Baldwin is a 69 y.o. male who presents via audio/video conferencing for a telehealth visit today.  Pt was last seen in cardiology clinic on 12/28/2022 by Delon Hoover, NP for preoperative cardiac evaluation, patient underwent nuclear stress test on 01/01/2023 that was normal and low risk.  The patient is now pending procedure as outlined above. Since his last visit, he has remained stable from a cardiac standpoint.  Patient reports that he is able to walk within his house, complete ADLs, some chores at his house and has been working with a physical therapist, tolerating exercises well.  He is able to achieve 4  METS of activity per patient report. Today he denies chest pain, shortness of breath, lower extremity edema, fatigue, palpitations, melena, hematuria, hemoptysis, diaphoresis, weakness, presyncope, syncope, orthopnea, and PND.  Past Medical History    Past Medical History:  Diagnosis Date   Acute respiratory failure with hypercapnia (HCC)    Anemia in other chronic diseases classified elsewhere    Anxiety    Cerebral infarction (HCC)    Chronic kidney disease    Dependence on renal dialysis (HCC)    Depression    Diabetes mellitus without complication (HCC)    Diverticulitis large intestine w/o perforation or abscess w/o bleeding    Dysphagia    End stage renal disease (HCC)    Essential (primary) hypertension    GERD (gastroesophageal reflux disease)    Hyperlipidemia    Hypothyroidism    Ileostomy status (HCC)    Metabolic encephalopathy    Personal history of other venous thrombosis and embolism    Presence of other vascular implants and grafts    Radiculopathy    Lumbar region   Thyroid  disease    Type 2 diabetes mellitus with diabetic chronic kidney disease (HCC)    Past Surgical History:  Procedure Laterality Date   A/V SHUNT INTERVENTION N/A 09/05/2023   Procedure: A/V SHUNT INTERVENTION;  Surgeon: Serene Gaile ORN, MD;  Location: [facility];  Service: Cardiovascular;  Laterality: N/A;   AV FISTULA PLACEMENT Left 10/25/2022   Procedure: LEFT ARM FIRST STAGE BASILIC VEIN (AV) FISTULA CREATION;  Surgeon: Magda Debby SAILOR, MD;  Location: [facility] OR;  Service: Vascular;  Laterality: Left;   BASCILIC VEIN TRANSPOSITION  Left 12/06/2022   Procedure: LEFT ARM SECOND STAGE BASILIC VEIN TRANSPOSITION;  Surgeon: Magda Debby SAILOR, MD;  [facility];  Service: Vascular;  Laterality: Left;   CARDIAC CATHETERIZATION  11/13/2019   [facility]   DIALYSIS/PERMA CATHETER REMOVAL     EXPLORATORY LAPAROTOMY  2024   Procedure: LAPAROTOMY EXPLORATORY, SIGMOID BOWEL RESECTION, OPEN ABDOMEN;  Surgeon: Hildreth Donnice Lenis, MD; Location: [facility] OR; Service: Trauma; Laterality: N/A;   EXPLORATORY LAPAROTOMY     Procedure: RE-EXPLORATION LAPAROTOMY, diverting loop ileostomy, rectosigmoid anastomosis; Surgeon: Hildreth Donnice Lenis, MD; Location: [facility] OR; Service: Trauma; Laterality: N/A;   EYE SURGERY Bilateral    Due to diabetes not cataracs   LAPAROSCOPIC INSERTION PERITONEAL CATHETER     VENOUS ANGIOPLASTY  09/05/2023   Procedure: VENOUS ANGIOPLASTY;  Surgeon: Serene Gaile ORN, MD;  Location: [facility];  Service: Cardiovascular;;   Allergies Allergies  Allergen Reactions   Penicillins Rash    Mild cutaneous reaction without swelling or trouble breathing. Likely reasonable to give cephalexin for pre-transplant prophylaxis.     Home Medications    Prior to Admission medications   Medication Sig Start Date End Date Taking? Authorizing Provider  acetaminophen  (TYLENOL ) 500 MG tablet Take 500 mg by mouth every 6 (six) hours as needed for moderate pain.    [provider]  apixaban  (ELIQUIS ) 2.5 MG TABS tablet Take 1 tablet (2.5 mg total) by mouth 2 (two) times daily. 12/08/22   Rhyne, Lucie PARAS, PA-C  hydrALAZINE (APRESOLINE) 50 MG tablet Take 50 mg by mouth 3 (three) times daily. 01/25/23 04/25/23  [provider]  Insulin  Glargine Solostar (LANTUS) 100 UNIT/ML Solostar Pen Inject 30 Units into the skin at bedtime. Hold if blood sugar less than 150    [provider]  insulin  lispro (HUMALOG KWIKPEN) 100 UNIT/ML KwikPen Inject 5 Units into the skin 4 (four) times daily as needed (If blood sugar is above 150).    [provider]  levothyroxine (SYNTHROID) 100 MCG tablet Take 100 mcg by mouth daily before breakfast.    [provider]  lisinopril (ZESTRIL) 20 MG tablet Take 20 mg by mouth See admin instructions. Take ONLY on Sun, Tues, Thurs and Sat (Non-dialysis days) if having high blood pressure    [provider]   midodrine  (PROAMATINE ) 10 MG tablet Take 10 mg by mouth every Monday, Wednesday, and Friday with hemodialysis. Hold if bp is lower than 110    [provider]  Nutritional Supplement LIQD Take 1 Container by mouth daily as needed (nutrition / misses a meal). Enterex diabetic nutritional shake    [provider]  oxybutynin (DITROPAN) 5 MG tablet Take 5 mg by mouth in the morning and at bedtime.    [provider]  pantoprazole (PROTONIX) 40 MG tablet Take 40 mg by mouth 2 (two) times daily before a meal.    [provider]  sucroferric oxyhydroxide (VELPHORO) 500 MG chewable tablet Chew 250 mg by mouth 3 (three) times daily with meals.    [provider]   Physical Exam  Vital Signs:  Francisco Baldwin does not have vital signs available for review today. Given telephonic nature of communication, physical exam is limited. AAOx3. NAD. Normal affect.  Speech and respirations are unlabored. Accessory Clinical Findings  None Assessment & Plan    1.  Preoperative Cardiovascular Risk Assessment: Francisco Baldwin perioperative risk of a major cardiac event is 11% according to the Revised Cardiac Risk Index (  RCRI). His functional capacity is fair, he is able to achieve 4 METs activity, according to the Duke Activity Status Index (DASI).  Patient had a nuclear stress test on 01/01/2023 that was normal and low risk. Recommendations: According to ACC/AHA guidelines, no further cardiovascular testing needed.  The patient may proceed to surgery at acceptable risk.   Antiplatelet and/or Anticoagulation Recommendations: Patient is on Eliquis  for history of DVT, Eliquis  has been prescribed and is managed by his PCP, recommendations regarding holding of Eliquis  will need to come from primary care provider.   The patient was advised that if he develops new symptoms prior to surgery to contact our office to arrange for a follow-up visit, and he verbalized  understanding.  A copy of this note will be routed to requesting surgeon.  Time:   Today, I have spent 15 minutes with the patient with telehealth technology discussing medical history, symptoms, and management plan.    Izora Benn D Jarelly Rinck, NP  09/20/2023, 2:58 PM

## 2023-09-23 DIAGNOSIS — D509 Iron deficiency anemia, unspecified: Secondary | ICD-10-CM | POA: Diagnosis not present

## 2023-09-23 DIAGNOSIS — N2581 Secondary hyperparathyroidism of renal origin: Secondary | ICD-10-CM | POA: Diagnosis not present

## 2023-09-23 DIAGNOSIS — Z992 Dependence on renal dialysis: Secondary | ICD-10-CM | POA: Diagnosis not present

## 2023-09-23 DIAGNOSIS — D631 Anemia in chronic kidney disease: Secondary | ICD-10-CM | POA: Diagnosis not present

## 2023-09-23 DIAGNOSIS — N186 End stage renal disease: Secondary | ICD-10-CM | POA: Diagnosis not present

## 2023-09-23 DIAGNOSIS — I10 Essential (primary) hypertension: Secondary | ICD-10-CM | POA: Diagnosis not present

## 2023-09-24 DIAGNOSIS — I120 Hypertensive chronic kidney disease with stage 5 chronic kidney disease or end stage renal disease: Secondary | ICD-10-CM | POA: Diagnosis not present

## 2023-09-24 DIAGNOSIS — Z86718 Personal history of other venous thrombosis and embolism: Secondary | ICD-10-CM | POA: Diagnosis not present

## 2023-09-24 DIAGNOSIS — E039 Hypothyroidism, unspecified: Secondary | ICD-10-CM | POA: Diagnosis not present

## 2023-09-24 DIAGNOSIS — K219 Gastro-esophageal reflux disease without esophagitis: Secondary | ICD-10-CM | POA: Diagnosis not present

## 2023-09-24 DIAGNOSIS — Z8673 Personal history of transient ischemic attack (TIA), and cerebral infarction without residual deficits: Secondary | ICD-10-CM | POA: Diagnosis not present

## 2023-09-24 DIAGNOSIS — E1152 Type 2 diabetes mellitus with diabetic peripheral angiopathy with gangrene: Secondary | ICD-10-CM | POA: Diagnosis not present

## 2023-09-24 DIAGNOSIS — D509 Iron deficiency anemia, unspecified: Secondary | ICD-10-CM | POA: Diagnosis not present

## 2023-09-24 DIAGNOSIS — N2581 Secondary hyperparathyroidism of renal origin: Secondary | ICD-10-CM | POA: Diagnosis not present

## 2023-09-24 DIAGNOSIS — Z7901 Long term (current) use of anticoagulants: Secondary | ICD-10-CM | POA: Diagnosis not present

## 2023-09-24 DIAGNOSIS — L89323 Pressure ulcer of left buttock, stage 3: Secondary | ICD-10-CM | POA: Diagnosis not present

## 2023-09-24 DIAGNOSIS — Z87891 Personal history of nicotine dependence: Secondary | ICD-10-CM | POA: Diagnosis not present

## 2023-09-24 DIAGNOSIS — Z931 Gastrostomy status: Secondary | ICD-10-CM | POA: Diagnosis not present

## 2023-09-24 DIAGNOSIS — F419 Anxiety disorder, unspecified: Secondary | ICD-10-CM | POA: Diagnosis not present

## 2023-09-24 DIAGNOSIS — E1122 Type 2 diabetes mellitus with diabetic chronic kidney disease: Secondary | ICD-10-CM | POA: Diagnosis not present

## 2023-09-24 DIAGNOSIS — N186 End stage renal disease: Secondary | ICD-10-CM | POA: Diagnosis not present

## 2023-09-24 DIAGNOSIS — L8995 Pressure ulcer of unspecified site, unstageable: Secondary | ICD-10-CM | POA: Diagnosis not present

## 2023-09-24 DIAGNOSIS — I10 Essential (primary) hypertension: Secondary | ICD-10-CM | POA: Diagnosis not present

## 2023-09-24 DIAGNOSIS — L89610 Pressure ulcer of right heel, unstageable: Secondary | ICD-10-CM | POA: Diagnosis not present

## 2023-09-24 DIAGNOSIS — Z79899 Other long term (current) drug therapy: Secondary | ICD-10-CM | POA: Diagnosis not present

## 2023-09-24 DIAGNOSIS — E785 Hyperlipidemia, unspecified: Secondary | ICD-10-CM | POA: Diagnosis not present

## 2023-09-24 DIAGNOSIS — Z992 Dependence on renal dialysis: Secondary | ICD-10-CM | POA: Diagnosis not present

## 2023-09-24 DIAGNOSIS — D631 Anemia in chronic kidney disease: Secondary | ICD-10-CM | POA: Diagnosis not present

## 2023-09-24 DIAGNOSIS — L89613 Pressure ulcer of right heel, stage 3: Secondary | ICD-10-CM | POA: Diagnosis not present

## 2023-09-25 DIAGNOSIS — N2581 Secondary hyperparathyroidism of renal origin: Secondary | ICD-10-CM | POA: Diagnosis not present

## 2023-09-25 DIAGNOSIS — D631 Anemia in chronic kidney disease: Secondary | ICD-10-CM | POA: Diagnosis not present

## 2023-09-25 DIAGNOSIS — Z992 Dependence on renal dialysis: Secondary | ICD-10-CM | POA: Diagnosis not present

## 2023-09-25 DIAGNOSIS — D509 Iron deficiency anemia, unspecified: Secondary | ICD-10-CM | POA: Diagnosis not present

## 2023-09-25 DIAGNOSIS — N186 End stage renal disease: Secondary | ICD-10-CM | POA: Diagnosis not present

## 2023-09-26 DIAGNOSIS — E1122 Type 2 diabetes mellitus with diabetic chronic kidney disease: Secondary | ICD-10-CM | POA: Diagnosis not present

## 2023-09-26 DIAGNOSIS — N186 End stage renal disease: Secondary | ICD-10-CM | POA: Diagnosis not present

## 2023-09-26 DIAGNOSIS — S72141D Displaced intertrochanteric fracture of right femur, subsequent encounter for closed fracture with routine healing: Secondary | ICD-10-CM | POA: Diagnosis not present

## 2023-09-26 DIAGNOSIS — L89890 Pressure ulcer of other site, unstageable: Secondary | ICD-10-CM | POA: Diagnosis not present

## 2023-09-26 DIAGNOSIS — I120 Hypertensive chronic kidney disease with stage 5 chronic kidney disease or end stage renal disease: Secondary | ICD-10-CM | POA: Diagnosis not present

## 2023-09-26 DIAGNOSIS — L89610 Pressure ulcer of right heel, unstageable: Secondary | ICD-10-CM | POA: Diagnosis not present

## 2023-09-27 DIAGNOSIS — D631 Anemia in chronic kidney disease: Secondary | ICD-10-CM | POA: Diagnosis not present

## 2023-09-27 DIAGNOSIS — Z992 Dependence on renal dialysis: Secondary | ICD-10-CM | POA: Diagnosis not present

## 2023-09-27 DIAGNOSIS — N186 End stage renal disease: Secondary | ICD-10-CM | POA: Diagnosis not present

## 2023-09-27 DIAGNOSIS — D509 Iron deficiency anemia, unspecified: Secondary | ICD-10-CM | POA: Diagnosis not present

## 2023-09-27 DIAGNOSIS — N2581 Secondary hyperparathyroidism of renal origin: Secondary | ICD-10-CM | POA: Diagnosis not present

## 2023-09-30 DIAGNOSIS — N186 End stage renal disease: Secondary | ICD-10-CM | POA: Diagnosis not present

## 2023-09-30 DIAGNOSIS — Z992 Dependence on renal dialysis: Secondary | ICD-10-CM | POA: Diagnosis not present

## 2023-09-30 DIAGNOSIS — D631 Anemia in chronic kidney disease: Secondary | ICD-10-CM | POA: Diagnosis not present

## 2023-09-30 DIAGNOSIS — N2581 Secondary hyperparathyroidism of renal origin: Secondary | ICD-10-CM | POA: Diagnosis not present

## 2023-09-30 DIAGNOSIS — D509 Iron deficiency anemia, unspecified: Secondary | ICD-10-CM | POA: Diagnosis not present

## 2023-10-02 DIAGNOSIS — Z992 Dependence on renal dialysis: Secondary | ICD-10-CM | POA: Diagnosis not present

## 2023-10-02 DIAGNOSIS — D509 Iron deficiency anemia, unspecified: Secondary | ICD-10-CM | POA: Diagnosis not present

## 2023-10-02 DIAGNOSIS — D631 Anemia in chronic kidney disease: Secondary | ICD-10-CM | POA: Diagnosis not present

## 2023-10-02 DIAGNOSIS — N186 End stage renal disease: Secondary | ICD-10-CM | POA: Diagnosis not present

## 2023-10-02 DIAGNOSIS — N2581 Secondary hyperparathyroidism of renal origin: Secondary | ICD-10-CM | POA: Diagnosis not present

## 2023-10-03 DIAGNOSIS — L89890 Pressure ulcer of other site, unstageable: Secondary | ICD-10-CM | POA: Diagnosis not present

## 2023-10-03 DIAGNOSIS — Z09 Encounter for follow-up examination after completed treatment for conditions other than malignant neoplasm: Secondary | ICD-10-CM | POA: Diagnosis not present

## 2023-10-03 DIAGNOSIS — L89613 Pressure ulcer of right heel, stage 3: Secondary | ICD-10-CM | POA: Diagnosis not present

## 2023-10-03 DIAGNOSIS — L89610 Pressure ulcer of right heel, unstageable: Secondary | ICD-10-CM | POA: Diagnosis not present

## 2023-10-04 DIAGNOSIS — D631 Anemia in chronic kidney disease: Secondary | ICD-10-CM | POA: Diagnosis not present

## 2023-10-04 DIAGNOSIS — N186 End stage renal disease: Secondary | ICD-10-CM | POA: Diagnosis not present

## 2023-10-04 DIAGNOSIS — D509 Iron deficiency anemia, unspecified: Secondary | ICD-10-CM | POA: Diagnosis not present

## 2023-10-04 DIAGNOSIS — Z992 Dependence on renal dialysis: Secondary | ICD-10-CM | POA: Diagnosis not present

## 2023-10-04 DIAGNOSIS — N2581 Secondary hyperparathyroidism of renal origin: Secondary | ICD-10-CM | POA: Diagnosis not present

## 2023-10-05 DIAGNOSIS — S72141D Displaced intertrochanteric fracture of right femur, subsequent encounter for closed fracture with routine healing: Secondary | ICD-10-CM | POA: Diagnosis not present

## 2023-10-05 DIAGNOSIS — L89890 Pressure ulcer of other site, unstageable: Secondary | ICD-10-CM | POA: Diagnosis not present

## 2023-10-05 DIAGNOSIS — L89610 Pressure ulcer of right heel, unstageable: Secondary | ICD-10-CM | POA: Diagnosis not present

## 2023-10-05 DIAGNOSIS — I120 Hypertensive chronic kidney disease with stage 5 chronic kidney disease or end stage renal disease: Secondary | ICD-10-CM | POA: Diagnosis not present

## 2023-10-05 DIAGNOSIS — N186 End stage renal disease: Secondary | ICD-10-CM | POA: Diagnosis not present

## 2023-10-05 DIAGNOSIS — E1122 Type 2 diabetes mellitus with diabetic chronic kidney disease: Secondary | ICD-10-CM | POA: Diagnosis not present

## 2023-10-07 DIAGNOSIS — D509 Iron deficiency anemia, unspecified: Secondary | ICD-10-CM | POA: Diagnosis not present

## 2023-10-07 DIAGNOSIS — D631 Anemia in chronic kidney disease: Secondary | ICD-10-CM | POA: Diagnosis not present

## 2023-10-07 DIAGNOSIS — N2581 Secondary hyperparathyroidism of renal origin: Secondary | ICD-10-CM | POA: Diagnosis not present

## 2023-10-07 DIAGNOSIS — Z992 Dependence on renal dialysis: Secondary | ICD-10-CM | POA: Diagnosis not present

## 2023-10-07 DIAGNOSIS — N186 End stage renal disease: Secondary | ICD-10-CM | POA: Diagnosis not present

## 2023-10-08 DIAGNOSIS — L89890 Pressure ulcer of other site, unstageable: Secondary | ICD-10-CM | POA: Diagnosis not present

## 2023-10-08 DIAGNOSIS — L89610 Pressure ulcer of right heel, unstageable: Secondary | ICD-10-CM | POA: Diagnosis not present

## 2023-10-08 DIAGNOSIS — E1122 Type 2 diabetes mellitus with diabetic chronic kidney disease: Secondary | ICD-10-CM | POA: Diagnosis not present

## 2023-10-08 DIAGNOSIS — I120 Hypertensive chronic kidney disease with stage 5 chronic kidney disease or end stage renal disease: Secondary | ICD-10-CM | POA: Diagnosis not present

## 2023-10-08 DIAGNOSIS — N186 End stage renal disease: Secondary | ICD-10-CM | POA: Diagnosis not present

## 2023-10-08 DIAGNOSIS — S72141D Displaced intertrochanteric fracture of right femur, subsequent encounter for closed fracture with routine healing: Secondary | ICD-10-CM | POA: Diagnosis not present

## 2023-10-09 DIAGNOSIS — Z992 Dependence on renal dialysis: Secondary | ICD-10-CM | POA: Diagnosis not present

## 2023-10-09 DIAGNOSIS — N186 End stage renal disease: Secondary | ICD-10-CM | POA: Diagnosis not present

## 2023-10-09 DIAGNOSIS — D509 Iron deficiency anemia, unspecified: Secondary | ICD-10-CM | POA: Diagnosis not present

## 2023-10-09 DIAGNOSIS — D631 Anemia in chronic kidney disease: Secondary | ICD-10-CM | POA: Diagnosis not present

## 2023-10-09 DIAGNOSIS — N2581 Secondary hyperparathyroidism of renal origin: Secondary | ICD-10-CM | POA: Diagnosis not present

## 2023-10-10 DIAGNOSIS — L89613 Pressure ulcer of right heel, stage 3: Secondary | ICD-10-CM | POA: Diagnosis not present

## 2023-10-10 DIAGNOSIS — L89890 Pressure ulcer of other site, unstageable: Secondary | ICD-10-CM | POA: Diagnosis not present

## 2023-10-10 DIAGNOSIS — L89610 Pressure ulcer of right heel, unstageable: Secondary | ICD-10-CM | POA: Diagnosis not present

## 2023-10-11 DIAGNOSIS — L89610 Pressure ulcer of right heel, unstageable: Secondary | ICD-10-CM | POA: Diagnosis not present

## 2023-10-11 DIAGNOSIS — D509 Iron deficiency anemia, unspecified: Secondary | ICD-10-CM | POA: Diagnosis not present

## 2023-10-11 DIAGNOSIS — K7460 Unspecified cirrhosis of liver: Secondary | ICD-10-CM | POA: Diagnosis not present

## 2023-10-11 DIAGNOSIS — E785 Hyperlipidemia, unspecified: Secondary | ICD-10-CM | POA: Diagnosis not present

## 2023-10-11 DIAGNOSIS — E1122 Type 2 diabetes mellitus with diabetic chronic kidney disease: Secondary | ICD-10-CM | POA: Diagnosis not present

## 2023-10-11 DIAGNOSIS — N186 End stage renal disease: Secondary | ICD-10-CM | POA: Diagnosis not present

## 2023-10-11 DIAGNOSIS — E1169 Type 2 diabetes mellitus with other specified complication: Secondary | ICD-10-CM | POA: Diagnosis not present

## 2023-10-11 DIAGNOSIS — Z604 Social exclusion and rejection: Secondary | ICD-10-CM | POA: Diagnosis not present

## 2023-10-11 DIAGNOSIS — I69359 Hemiplegia and hemiparesis following cerebral infarction affecting unspecified side: Secondary | ICD-10-CM | POA: Diagnosis not present

## 2023-10-11 DIAGNOSIS — S72141D Displaced intertrochanteric fracture of right femur, subsequent encounter for closed fracture with routine healing: Secondary | ICD-10-CM | POA: Diagnosis not present

## 2023-10-11 DIAGNOSIS — E1142 Type 2 diabetes mellitus with diabetic polyneuropathy: Secondary | ICD-10-CM | POA: Diagnosis not present

## 2023-10-11 DIAGNOSIS — I120 Hypertensive chronic kidney disease with stage 5 chronic kidney disease or end stage renal disease: Secondary | ICD-10-CM | POA: Diagnosis not present

## 2023-10-11 DIAGNOSIS — R1310 Dysphagia, unspecified: Secondary | ICD-10-CM | POA: Diagnosis not present

## 2023-10-11 DIAGNOSIS — L89890 Pressure ulcer of other site, unstageable: Secondary | ICD-10-CM | POA: Diagnosis not present

## 2023-10-11 DIAGNOSIS — Z556 Problems related to health literacy: Secondary | ICD-10-CM | POA: Diagnosis not present

## 2023-10-11 DIAGNOSIS — D631 Anemia in chronic kidney disease: Secondary | ICD-10-CM | POA: Diagnosis not present

## 2023-10-11 DIAGNOSIS — Z7901 Long term (current) use of anticoagulants: Secondary | ICD-10-CM | POA: Diagnosis not present

## 2023-10-11 DIAGNOSIS — R32 Unspecified urinary incontinence: Secondary | ICD-10-CM | POA: Diagnosis not present

## 2023-10-11 DIAGNOSIS — N2581 Secondary hyperparathyroidism of renal origin: Secondary | ICD-10-CM | POA: Diagnosis not present

## 2023-10-11 DIAGNOSIS — Z992 Dependence on renal dialysis: Secondary | ICD-10-CM | POA: Diagnosis not present

## 2023-10-14 DIAGNOSIS — D509 Iron deficiency anemia, unspecified: Secondary | ICD-10-CM | POA: Diagnosis not present

## 2023-10-14 DIAGNOSIS — N186 End stage renal disease: Secondary | ICD-10-CM | POA: Diagnosis not present

## 2023-10-14 DIAGNOSIS — Z992 Dependence on renal dialysis: Secondary | ICD-10-CM | POA: Diagnosis not present

## 2023-10-14 DIAGNOSIS — D631 Anemia in chronic kidney disease: Secondary | ICD-10-CM | POA: Diagnosis not present

## 2023-10-14 DIAGNOSIS — N2581 Secondary hyperparathyroidism of renal origin: Secondary | ICD-10-CM | POA: Diagnosis not present

## 2023-10-15 DIAGNOSIS — E1122 Type 2 diabetes mellitus with diabetic chronic kidney disease: Secondary | ICD-10-CM | POA: Diagnosis not present

## 2023-10-15 DIAGNOSIS — N186 End stage renal disease: Secondary | ICD-10-CM | POA: Diagnosis not present

## 2023-10-15 DIAGNOSIS — L89890 Pressure ulcer of other site, unstageable: Secondary | ICD-10-CM | POA: Diagnosis not present

## 2023-10-15 DIAGNOSIS — S72141D Displaced intertrochanteric fracture of right femur, subsequent encounter for closed fracture with routine healing: Secondary | ICD-10-CM | POA: Diagnosis not present

## 2023-10-15 DIAGNOSIS — L89610 Pressure ulcer of right heel, unstageable: Secondary | ICD-10-CM | POA: Diagnosis not present

## 2023-10-15 DIAGNOSIS — I120 Hypertensive chronic kidney disease with stage 5 chronic kidney disease or end stage renal disease: Secondary | ICD-10-CM | POA: Diagnosis not present

## 2023-10-16 DIAGNOSIS — D631 Anemia in chronic kidney disease: Secondary | ICD-10-CM | POA: Diagnosis not present

## 2023-10-16 DIAGNOSIS — N186 End stage renal disease: Secondary | ICD-10-CM | POA: Diagnosis not present

## 2023-10-16 DIAGNOSIS — D509 Iron deficiency anemia, unspecified: Secondary | ICD-10-CM | POA: Diagnosis not present

## 2023-10-16 DIAGNOSIS — N2581 Secondary hyperparathyroidism of renal origin: Secondary | ICD-10-CM | POA: Diagnosis not present

## 2023-10-16 DIAGNOSIS — Z992 Dependence on renal dialysis: Secondary | ICD-10-CM | POA: Diagnosis not present

## 2023-10-17 DIAGNOSIS — L89610 Pressure ulcer of right heel, unstageable: Secondary | ICD-10-CM | POA: Diagnosis not present

## 2023-10-17 DIAGNOSIS — L89890 Pressure ulcer of other site, unstageable: Secondary | ICD-10-CM | POA: Diagnosis not present

## 2023-10-17 DIAGNOSIS — L89613 Pressure ulcer of right heel, stage 3: Secondary | ICD-10-CM | POA: Diagnosis not present

## 2023-10-18 DIAGNOSIS — N2581 Secondary hyperparathyroidism of renal origin: Secondary | ICD-10-CM | POA: Diagnosis not present

## 2023-10-18 DIAGNOSIS — Z992 Dependence on renal dialysis: Secondary | ICD-10-CM | POA: Diagnosis not present

## 2023-10-18 DIAGNOSIS — N186 End stage renal disease: Secondary | ICD-10-CM | POA: Diagnosis not present

## 2023-10-18 DIAGNOSIS — D509 Iron deficiency anemia, unspecified: Secondary | ICD-10-CM | POA: Diagnosis not present

## 2023-10-18 DIAGNOSIS — D631 Anemia in chronic kidney disease: Secondary | ICD-10-CM | POA: Diagnosis not present

## 2023-10-21 DIAGNOSIS — N2581 Secondary hyperparathyroidism of renal origin: Secondary | ICD-10-CM | POA: Diagnosis not present

## 2023-10-21 DIAGNOSIS — Z992 Dependence on renal dialysis: Secondary | ICD-10-CM | POA: Diagnosis not present

## 2023-10-21 DIAGNOSIS — N186 End stage renal disease: Secondary | ICD-10-CM | POA: Diagnosis not present

## 2023-10-21 DIAGNOSIS — D631 Anemia in chronic kidney disease: Secondary | ICD-10-CM | POA: Diagnosis not present

## 2023-10-21 DIAGNOSIS — D509 Iron deficiency anemia, unspecified: Secondary | ICD-10-CM | POA: Diagnosis not present

## 2023-10-23 DIAGNOSIS — Z992 Dependence on renal dialysis: Secondary | ICD-10-CM | POA: Diagnosis not present

## 2023-10-23 DIAGNOSIS — N2581 Secondary hyperparathyroidism of renal origin: Secondary | ICD-10-CM | POA: Diagnosis not present

## 2023-10-23 DIAGNOSIS — D631 Anemia in chronic kidney disease: Secondary | ICD-10-CM | POA: Diagnosis not present

## 2023-10-23 DIAGNOSIS — D509 Iron deficiency anemia, unspecified: Secondary | ICD-10-CM | POA: Diagnosis not present

## 2023-10-23 DIAGNOSIS — N186 End stage renal disease: Secondary | ICD-10-CM | POA: Diagnosis not present

## 2023-10-24 DIAGNOSIS — E1122 Type 2 diabetes mellitus with diabetic chronic kidney disease: Secondary | ICD-10-CM | POA: Diagnosis not present

## 2023-10-24 DIAGNOSIS — S72141D Displaced intertrochanteric fracture of right femur, subsequent encounter for closed fracture with routine healing: Secondary | ICD-10-CM | POA: Diagnosis not present

## 2023-10-24 DIAGNOSIS — L89613 Pressure ulcer of right heel, stage 3: Secondary | ICD-10-CM | POA: Diagnosis not present

## 2023-10-24 DIAGNOSIS — I10 Essential (primary) hypertension: Secondary | ICD-10-CM | POA: Diagnosis not present

## 2023-10-24 DIAGNOSIS — L89890 Pressure ulcer of other site, unstageable: Secondary | ICD-10-CM | POA: Diagnosis not present

## 2023-10-24 DIAGNOSIS — L89610 Pressure ulcer of right heel, unstageable: Secondary | ICD-10-CM | POA: Diagnosis not present

## 2023-10-24 DIAGNOSIS — N186 End stage renal disease: Secondary | ICD-10-CM | POA: Diagnosis not present

## 2023-10-24 DIAGNOSIS — I120 Hypertensive chronic kidney disease with stage 5 chronic kidney disease or end stage renal disease: Secondary | ICD-10-CM | POA: Diagnosis not present

## 2023-10-25 DIAGNOSIS — D631 Anemia in chronic kidney disease: Secondary | ICD-10-CM | POA: Diagnosis not present

## 2023-10-25 DIAGNOSIS — N2581 Secondary hyperparathyroidism of renal origin: Secondary | ICD-10-CM | POA: Diagnosis not present

## 2023-10-25 DIAGNOSIS — D509 Iron deficiency anemia, unspecified: Secondary | ICD-10-CM | POA: Diagnosis not present

## 2023-10-25 DIAGNOSIS — Z992 Dependence on renal dialysis: Secondary | ICD-10-CM | POA: Diagnosis not present

## 2023-10-25 DIAGNOSIS — E1122 Type 2 diabetes mellitus with diabetic chronic kidney disease: Secondary | ICD-10-CM | POA: Diagnosis not present

## 2023-10-25 DIAGNOSIS — N186 End stage renal disease: Secondary | ICD-10-CM | POA: Diagnosis not present

## 2023-10-28 DIAGNOSIS — Z992 Dependence on renal dialysis: Secondary | ICD-10-CM | POA: Diagnosis not present

## 2023-10-28 DIAGNOSIS — D631 Anemia in chronic kidney disease: Secondary | ICD-10-CM | POA: Diagnosis not present

## 2023-10-28 DIAGNOSIS — D509 Iron deficiency anemia, unspecified: Secondary | ICD-10-CM | POA: Diagnosis not present

## 2023-10-28 DIAGNOSIS — N2581 Secondary hyperparathyroidism of renal origin: Secondary | ICD-10-CM | POA: Diagnosis not present

## 2023-10-28 DIAGNOSIS — N186 End stage renal disease: Secondary | ICD-10-CM | POA: Diagnosis not present

## 2023-10-29 DIAGNOSIS — S72141D Displaced intertrochanteric fracture of right femur, subsequent encounter for closed fracture with routine healing: Secondary | ICD-10-CM | POA: Diagnosis not present

## 2023-10-29 DIAGNOSIS — E1122 Type 2 diabetes mellitus with diabetic chronic kidney disease: Secondary | ICD-10-CM | POA: Diagnosis not present

## 2023-10-29 DIAGNOSIS — N186 End stage renal disease: Secondary | ICD-10-CM | POA: Diagnosis not present

## 2023-10-29 DIAGNOSIS — L89890 Pressure ulcer of other site, unstageable: Secondary | ICD-10-CM | POA: Diagnosis not present

## 2023-10-29 DIAGNOSIS — I120 Hypertensive chronic kidney disease with stage 5 chronic kidney disease or end stage renal disease: Secondary | ICD-10-CM | POA: Diagnosis not present

## 2023-10-29 DIAGNOSIS — L89610 Pressure ulcer of right heel, unstageable: Secondary | ICD-10-CM | POA: Diagnosis not present

## 2023-10-30 DIAGNOSIS — Z992 Dependence on renal dialysis: Secondary | ICD-10-CM | POA: Diagnosis not present

## 2023-10-30 DIAGNOSIS — N186 End stage renal disease: Secondary | ICD-10-CM | POA: Diagnosis not present

## 2023-10-30 DIAGNOSIS — I120 Hypertensive chronic kidney disease with stage 5 chronic kidney disease or end stage renal disease: Secondary | ICD-10-CM | POA: Diagnosis not present

## 2023-10-30 DIAGNOSIS — K5900 Constipation, unspecified: Secondary | ICD-10-CM | POA: Diagnosis not present

## 2023-10-30 DIAGNOSIS — I4891 Unspecified atrial fibrillation: Secondary | ICD-10-CM | POA: Diagnosis not present

## 2023-10-30 DIAGNOSIS — E1122 Type 2 diabetes mellitus with diabetic chronic kidney disease: Secondary | ICD-10-CM | POA: Diagnosis not present

## 2023-10-30 DIAGNOSIS — I491 Atrial premature depolarization: Secondary | ICD-10-CM | POA: Diagnosis not present

## 2023-10-30 DIAGNOSIS — K828 Other specified diseases of gallbladder: Secondary | ICD-10-CM | POA: Diagnosis not present

## 2023-10-30 DIAGNOSIS — I10 Essential (primary) hypertension: Secondary | ICD-10-CM | POA: Diagnosis not present

## 2023-10-30 DIAGNOSIS — R112 Nausea with vomiting, unspecified: Secondary | ICD-10-CM | POA: Diagnosis not present

## 2023-10-30 DIAGNOSIS — D509 Iron deficiency anemia, unspecified: Secondary | ICD-10-CM | POA: Diagnosis not present

## 2023-10-30 DIAGNOSIS — D631 Anemia in chronic kidney disease: Secondary | ICD-10-CM | POA: Diagnosis not present

## 2023-10-30 DIAGNOSIS — N2581 Secondary hyperparathyroidism of renal origin: Secondary | ICD-10-CM | POA: Diagnosis not present

## 2023-10-30 DIAGNOSIS — I4510 Unspecified right bundle-branch block: Secondary | ICD-10-CM | POA: Diagnosis not present

## 2023-10-30 DIAGNOSIS — I444 Left anterior fascicular block: Secondary | ICD-10-CM | POA: Diagnosis not present

## 2023-10-30 DIAGNOSIS — R1084 Generalized abdominal pain: Secondary | ICD-10-CM | POA: Diagnosis not present

## 2023-10-30 DIAGNOSIS — R110 Nausea: Secondary | ICD-10-CM | POA: Diagnosis not present

## 2023-10-30 DIAGNOSIS — R1031 Right lower quadrant pain: Secondary | ICD-10-CM | POA: Diagnosis not present

## 2023-10-30 DIAGNOSIS — R9431 Abnormal electrocardiogram [ECG] [EKG]: Secondary | ICD-10-CM | POA: Diagnosis not present

## 2023-10-31 DIAGNOSIS — L89613 Pressure ulcer of right heel, stage 3: Secondary | ICD-10-CM | POA: Diagnosis not present

## 2023-10-31 DIAGNOSIS — L89610 Pressure ulcer of right heel, unstageable: Secondary | ICD-10-CM | POA: Diagnosis not present

## 2023-11-01 DIAGNOSIS — D509 Iron deficiency anemia, unspecified: Secondary | ICD-10-CM | POA: Diagnosis not present

## 2023-11-01 DIAGNOSIS — D631 Anemia in chronic kidney disease: Secondary | ICD-10-CM | POA: Diagnosis not present

## 2023-11-01 DIAGNOSIS — Z992 Dependence on renal dialysis: Secondary | ICD-10-CM | POA: Diagnosis not present

## 2023-11-01 DIAGNOSIS — N186 End stage renal disease: Secondary | ICD-10-CM | POA: Diagnosis not present

## 2023-11-01 DIAGNOSIS — N2581 Secondary hyperparathyroidism of renal origin: Secondary | ICD-10-CM | POA: Diagnosis not present

## 2023-11-04 DIAGNOSIS — N2581 Secondary hyperparathyroidism of renal origin: Secondary | ICD-10-CM | POA: Diagnosis not present

## 2023-11-04 DIAGNOSIS — D631 Anemia in chronic kidney disease: Secondary | ICD-10-CM | POA: Diagnosis not present

## 2023-11-04 DIAGNOSIS — D509 Iron deficiency anemia, unspecified: Secondary | ICD-10-CM | POA: Diagnosis not present

## 2023-11-04 DIAGNOSIS — N186 End stage renal disease: Secondary | ICD-10-CM | POA: Diagnosis not present

## 2023-11-04 DIAGNOSIS — Z992 Dependence on renal dialysis: Secondary | ICD-10-CM | POA: Diagnosis not present

## 2023-11-05 DIAGNOSIS — L89890 Pressure ulcer of other site, unstageable: Secondary | ICD-10-CM | POA: Diagnosis not present

## 2023-11-05 DIAGNOSIS — I120 Hypertensive chronic kidney disease with stage 5 chronic kidney disease or end stage renal disease: Secondary | ICD-10-CM | POA: Diagnosis not present

## 2023-11-05 DIAGNOSIS — N186 End stage renal disease: Secondary | ICD-10-CM | POA: Diagnosis not present

## 2023-11-05 DIAGNOSIS — E1122 Type 2 diabetes mellitus with diabetic chronic kidney disease: Secondary | ICD-10-CM | POA: Diagnosis not present

## 2023-11-05 DIAGNOSIS — L89610 Pressure ulcer of right heel, unstageable: Secondary | ICD-10-CM | POA: Diagnosis not present

## 2023-11-05 DIAGNOSIS — S72141D Displaced intertrochanteric fracture of right femur, subsequent encounter for closed fracture with routine healing: Secondary | ICD-10-CM | POA: Diagnosis not present

## 2023-11-06 DIAGNOSIS — N186 End stage renal disease: Secondary | ICD-10-CM | POA: Diagnosis not present

## 2023-11-06 DIAGNOSIS — D509 Iron deficiency anemia, unspecified: Secondary | ICD-10-CM | POA: Diagnosis not present

## 2023-11-06 DIAGNOSIS — E1122 Type 2 diabetes mellitus with diabetic chronic kidney disease: Secondary | ICD-10-CM | POA: Diagnosis not present

## 2023-11-06 DIAGNOSIS — N2581 Secondary hyperparathyroidism of renal origin: Secondary | ICD-10-CM | POA: Diagnosis not present

## 2023-11-06 DIAGNOSIS — D631 Anemia in chronic kidney disease: Secondary | ICD-10-CM | POA: Diagnosis not present

## 2023-11-06 DIAGNOSIS — Z992 Dependence on renal dialysis: Secondary | ICD-10-CM | POA: Diagnosis not present

## 2023-11-07 DIAGNOSIS — L89613 Pressure ulcer of right heel, stage 3: Secondary | ICD-10-CM | POA: Diagnosis not present

## 2023-11-07 DIAGNOSIS — L89610 Pressure ulcer of right heel, unstageable: Secondary | ICD-10-CM | POA: Diagnosis not present

## 2023-11-07 DIAGNOSIS — I739 Peripheral vascular disease, unspecified: Secondary | ICD-10-CM | POA: Diagnosis not present

## 2023-11-08 DIAGNOSIS — D631 Anemia in chronic kidney disease: Secondary | ICD-10-CM | POA: Diagnosis not present

## 2023-11-08 DIAGNOSIS — Z992 Dependence on renal dialysis: Secondary | ICD-10-CM | POA: Diagnosis not present

## 2023-11-08 DIAGNOSIS — N186 End stage renal disease: Secondary | ICD-10-CM | POA: Diagnosis not present

## 2023-11-08 DIAGNOSIS — D509 Iron deficiency anemia, unspecified: Secondary | ICD-10-CM | POA: Diagnosis not present

## 2023-11-08 DIAGNOSIS — N2581 Secondary hyperparathyroidism of renal origin: Secondary | ICD-10-CM | POA: Diagnosis not present

## 2023-11-10 DIAGNOSIS — E1122 Type 2 diabetes mellitus with diabetic chronic kidney disease: Secondary | ICD-10-CM | POA: Diagnosis not present

## 2023-11-10 DIAGNOSIS — E1142 Type 2 diabetes mellitus with diabetic polyneuropathy: Secondary | ICD-10-CM | POA: Diagnosis not present

## 2023-11-10 DIAGNOSIS — Z7901 Long term (current) use of anticoagulants: Secondary | ICD-10-CM | POA: Diagnosis not present

## 2023-11-10 DIAGNOSIS — N186 End stage renal disease: Secondary | ICD-10-CM | POA: Diagnosis not present

## 2023-11-10 DIAGNOSIS — S72141D Displaced intertrochanteric fracture of right femur, subsequent encounter for closed fracture with routine healing: Secondary | ICD-10-CM | POA: Diagnosis not present

## 2023-11-10 DIAGNOSIS — E1169 Type 2 diabetes mellitus with other specified complication: Secondary | ICD-10-CM | POA: Diagnosis not present

## 2023-11-10 DIAGNOSIS — L89610 Pressure ulcer of right heel, unstageable: Secondary | ICD-10-CM | POA: Diagnosis not present

## 2023-11-10 DIAGNOSIS — Z556 Problems related to health literacy: Secondary | ICD-10-CM | POA: Diagnosis not present

## 2023-11-10 DIAGNOSIS — Z604 Social exclusion and rejection: Secondary | ICD-10-CM | POA: Diagnosis not present

## 2023-11-10 DIAGNOSIS — I69359 Hemiplegia and hemiparesis following cerebral infarction affecting unspecified side: Secondary | ICD-10-CM | POA: Diagnosis not present

## 2023-11-10 DIAGNOSIS — R32 Unspecified urinary incontinence: Secondary | ICD-10-CM | POA: Diagnosis not present

## 2023-11-10 DIAGNOSIS — E785 Hyperlipidemia, unspecified: Secondary | ICD-10-CM | POA: Diagnosis not present

## 2023-11-10 DIAGNOSIS — Z992 Dependence on renal dialysis: Secondary | ICD-10-CM | POA: Diagnosis not present

## 2023-11-10 DIAGNOSIS — I120 Hypertensive chronic kidney disease with stage 5 chronic kidney disease or end stage renal disease: Secondary | ICD-10-CM | POA: Diagnosis not present

## 2023-11-10 DIAGNOSIS — L89890 Pressure ulcer of other site, unstageable: Secondary | ICD-10-CM | POA: Diagnosis not present

## 2023-11-10 DIAGNOSIS — R1310 Dysphagia, unspecified: Secondary | ICD-10-CM | POA: Diagnosis not present

## 2023-11-10 DIAGNOSIS — K7460 Unspecified cirrhosis of liver: Secondary | ICD-10-CM | POA: Diagnosis not present

## 2023-11-11 DIAGNOSIS — D631 Anemia in chronic kidney disease: Secondary | ICD-10-CM | POA: Diagnosis not present

## 2023-11-11 DIAGNOSIS — N2581 Secondary hyperparathyroidism of renal origin: Secondary | ICD-10-CM | POA: Diagnosis not present

## 2023-11-11 DIAGNOSIS — N186 End stage renal disease: Secondary | ICD-10-CM | POA: Diagnosis not present

## 2023-11-11 DIAGNOSIS — Z992 Dependence on renal dialysis: Secondary | ICD-10-CM | POA: Diagnosis not present

## 2023-11-11 DIAGNOSIS — D509 Iron deficiency anemia, unspecified: Secondary | ICD-10-CM | POA: Diagnosis not present

## 2023-11-12 DIAGNOSIS — L89890 Pressure ulcer of other site, unstageable: Secondary | ICD-10-CM | POA: Diagnosis not present

## 2023-11-12 DIAGNOSIS — I120 Hypertensive chronic kidney disease with stage 5 chronic kidney disease or end stage renal disease: Secondary | ICD-10-CM | POA: Diagnosis not present

## 2023-11-12 DIAGNOSIS — S72141D Displaced intertrochanteric fracture of right femur, subsequent encounter for closed fracture with routine healing: Secondary | ICD-10-CM | POA: Diagnosis not present

## 2023-11-12 DIAGNOSIS — E1122 Type 2 diabetes mellitus with diabetic chronic kidney disease: Secondary | ICD-10-CM | POA: Diagnosis not present

## 2023-11-12 DIAGNOSIS — N186 End stage renal disease: Secondary | ICD-10-CM | POA: Diagnosis not present

## 2023-11-12 DIAGNOSIS — L89610 Pressure ulcer of right heel, unstageable: Secondary | ICD-10-CM | POA: Diagnosis not present

## 2023-11-13 DIAGNOSIS — N2581 Secondary hyperparathyroidism of renal origin: Secondary | ICD-10-CM | POA: Diagnosis not present

## 2023-11-13 DIAGNOSIS — N186 End stage renal disease: Secondary | ICD-10-CM | POA: Diagnosis not present

## 2023-11-13 DIAGNOSIS — D631 Anemia in chronic kidney disease: Secondary | ICD-10-CM | POA: Diagnosis not present

## 2023-11-13 DIAGNOSIS — Z992 Dependence on renal dialysis: Secondary | ICD-10-CM | POA: Diagnosis not present

## 2023-11-13 DIAGNOSIS — D509 Iron deficiency anemia, unspecified: Secondary | ICD-10-CM | POA: Diagnosis not present

## 2023-11-14 DIAGNOSIS — E039 Hypothyroidism, unspecified: Secondary | ICD-10-CM | POA: Diagnosis not present

## 2023-11-14 DIAGNOSIS — L89613 Pressure ulcer of right heel, stage 3: Secondary | ICD-10-CM | POA: Diagnosis not present

## 2023-11-14 DIAGNOSIS — L89314 Pressure ulcer of right buttock, stage 4: Secondary | ICD-10-CM | POA: Diagnosis not present

## 2023-11-14 DIAGNOSIS — E1169 Type 2 diabetes mellitus with other specified complication: Secondary | ICD-10-CM | POA: Diagnosis not present

## 2023-11-15 DIAGNOSIS — N2581 Secondary hyperparathyroidism of renal origin: Secondary | ICD-10-CM | POA: Diagnosis not present

## 2023-11-15 DIAGNOSIS — Z992 Dependence on renal dialysis: Secondary | ICD-10-CM | POA: Diagnosis not present

## 2023-11-15 DIAGNOSIS — D509 Iron deficiency anemia, unspecified: Secondary | ICD-10-CM | POA: Diagnosis not present

## 2023-11-15 DIAGNOSIS — D631 Anemia in chronic kidney disease: Secondary | ICD-10-CM | POA: Diagnosis not present

## 2023-11-15 DIAGNOSIS — N186 End stage renal disease: Secondary | ICD-10-CM | POA: Diagnosis not present

## 2023-11-18 DIAGNOSIS — N2581 Secondary hyperparathyroidism of renal origin: Secondary | ICD-10-CM | POA: Diagnosis not present

## 2023-11-18 DIAGNOSIS — N186 End stage renal disease: Secondary | ICD-10-CM | POA: Diagnosis not present

## 2023-11-18 DIAGNOSIS — D509 Iron deficiency anemia, unspecified: Secondary | ICD-10-CM | POA: Diagnosis not present

## 2023-11-18 DIAGNOSIS — D631 Anemia in chronic kidney disease: Secondary | ICD-10-CM | POA: Diagnosis not present

## 2023-11-18 DIAGNOSIS — Z992 Dependence on renal dialysis: Secondary | ICD-10-CM | POA: Diagnosis not present

## 2023-11-19 DIAGNOSIS — L89890 Pressure ulcer of other site, unstageable: Secondary | ICD-10-CM | POA: Diagnosis not present

## 2023-11-19 DIAGNOSIS — N186 End stage renal disease: Secondary | ICD-10-CM | POA: Diagnosis not present

## 2023-11-19 DIAGNOSIS — E1122 Type 2 diabetes mellitus with diabetic chronic kidney disease: Secondary | ICD-10-CM | POA: Diagnosis not present

## 2023-11-19 DIAGNOSIS — S72141D Displaced intertrochanteric fracture of right femur, subsequent encounter for closed fracture with routine healing: Secondary | ICD-10-CM | POA: Diagnosis not present

## 2023-11-19 DIAGNOSIS — L89610 Pressure ulcer of right heel, unstageable: Secondary | ICD-10-CM | POA: Diagnosis not present

## 2023-11-19 DIAGNOSIS — L89613 Pressure ulcer of right heel, stage 3: Secondary | ICD-10-CM | POA: Diagnosis not present

## 2023-11-19 DIAGNOSIS — I120 Hypertensive chronic kidney disease with stage 5 chronic kidney disease or end stage renal disease: Secondary | ICD-10-CM | POA: Diagnosis not present

## 2023-11-20 DIAGNOSIS — N186 End stage renal disease: Secondary | ICD-10-CM | POA: Diagnosis not present

## 2023-11-20 DIAGNOSIS — Z992 Dependence on renal dialysis: Secondary | ICD-10-CM | POA: Diagnosis not present

## 2023-11-20 DIAGNOSIS — N2581 Secondary hyperparathyroidism of renal origin: Secondary | ICD-10-CM | POA: Diagnosis not present

## 2023-11-20 DIAGNOSIS — D631 Anemia in chronic kidney disease: Secondary | ICD-10-CM | POA: Diagnosis not present

## 2023-11-20 DIAGNOSIS — D509 Iron deficiency anemia, unspecified: Secondary | ICD-10-CM | POA: Diagnosis not present

## 2023-11-21 DIAGNOSIS — E1169 Type 2 diabetes mellitus with other specified complication: Secondary | ICD-10-CM | POA: Diagnosis not present

## 2023-11-21 DIAGNOSIS — E039 Hypothyroidism, unspecified: Secondary | ICD-10-CM | POA: Diagnosis not present

## 2023-11-21 DIAGNOSIS — L89613 Pressure ulcer of right heel, stage 3: Secondary | ICD-10-CM | POA: Diagnosis not present

## 2023-11-21 DIAGNOSIS — Z6822 Body mass index (BMI) 22.0-22.9, adult: Secondary | ICD-10-CM | POA: Diagnosis not present

## 2023-11-21 DIAGNOSIS — L89610 Pressure ulcer of right heel, unstageable: Secondary | ICD-10-CM | POA: Diagnosis not present

## 2023-11-21 DIAGNOSIS — E785 Hyperlipidemia, unspecified: Secondary | ICD-10-CM | POA: Diagnosis not present

## 2023-11-21 DIAGNOSIS — L723 Sebaceous cyst: Secondary | ICD-10-CM | POA: Diagnosis not present

## 2023-11-22 DIAGNOSIS — Z992 Dependence on renal dialysis: Secondary | ICD-10-CM | POA: Diagnosis not present

## 2023-11-22 DIAGNOSIS — D509 Iron deficiency anemia, unspecified: Secondary | ICD-10-CM | POA: Diagnosis not present

## 2023-11-22 DIAGNOSIS — N186 End stage renal disease: Secondary | ICD-10-CM | POA: Diagnosis not present

## 2023-11-22 DIAGNOSIS — N2581 Secondary hyperparathyroidism of renal origin: Secondary | ICD-10-CM | POA: Diagnosis not present

## 2023-11-22 DIAGNOSIS — D631 Anemia in chronic kidney disease: Secondary | ICD-10-CM | POA: Diagnosis not present

## 2023-11-24 DIAGNOSIS — I10 Essential (primary) hypertension: Secondary | ICD-10-CM | POA: Diagnosis not present

## 2023-11-25 DIAGNOSIS — E1122 Type 2 diabetes mellitus with diabetic chronic kidney disease: Secondary | ICD-10-CM | POA: Diagnosis not present

## 2023-11-25 DIAGNOSIS — Z992 Dependence on renal dialysis: Secondary | ICD-10-CM | POA: Diagnosis not present

## 2023-11-25 DIAGNOSIS — N186 End stage renal disease: Secondary | ICD-10-CM | POA: Diagnosis not present

## 2023-11-25 DIAGNOSIS — N2581 Secondary hyperparathyroidism of renal origin: Secondary | ICD-10-CM | POA: Diagnosis not present

## 2023-11-25 DIAGNOSIS — D631 Anemia in chronic kidney disease: Secondary | ICD-10-CM | POA: Diagnosis not present

## 2023-11-25 DIAGNOSIS — D509 Iron deficiency anemia, unspecified: Secondary | ICD-10-CM | POA: Diagnosis not present

## 2023-11-26 DIAGNOSIS — I70234 Atherosclerosis of native arteries of right leg with ulceration of heel and midfoot: Secondary | ICD-10-CM | POA: Diagnosis not present

## 2023-11-26 DIAGNOSIS — N186 End stage renal disease: Secondary | ICD-10-CM | POA: Diagnosis not present

## 2023-11-26 DIAGNOSIS — Z87891 Personal history of nicotine dependence: Secondary | ICD-10-CM | POA: Diagnosis not present

## 2023-11-26 DIAGNOSIS — E1151 Type 2 diabetes mellitus with diabetic peripheral angiopathy without gangrene: Secondary | ICD-10-CM | POA: Diagnosis not present

## 2023-11-26 DIAGNOSIS — Z794 Long term (current) use of insulin: Secondary | ICD-10-CM | POA: Diagnosis not present

## 2023-11-26 DIAGNOSIS — E1122 Type 2 diabetes mellitus with diabetic chronic kidney disease: Secondary | ICD-10-CM | POA: Diagnosis not present

## 2023-11-26 DIAGNOSIS — I70221 Atherosclerosis of native arteries of extremities with rest pain, right leg: Secondary | ICD-10-CM | POA: Diagnosis not present

## 2023-11-26 DIAGNOSIS — L97415 Non-pressure chronic ulcer of right heel and midfoot with muscle involvement without evidence of necrosis: Secondary | ICD-10-CM | POA: Diagnosis not present

## 2023-11-26 DIAGNOSIS — I120 Hypertensive chronic kidney disease with stage 5 chronic kidney disease or end stage renal disease: Secondary | ICD-10-CM | POA: Diagnosis not present

## 2023-11-26 DIAGNOSIS — E11621 Type 2 diabetes mellitus with foot ulcer: Secondary | ICD-10-CM | POA: Diagnosis not present

## 2023-11-26 DIAGNOSIS — L97919 Non-pressure chronic ulcer of unspecified part of right lower leg with unspecified severity: Secondary | ICD-10-CM | POA: Diagnosis not present

## 2023-11-26 DIAGNOSIS — I70239 Atherosclerosis of native arteries of right leg with ulceration of unspecified site: Secondary | ICD-10-CM | POA: Diagnosis not present

## 2023-11-27 DIAGNOSIS — N2581 Secondary hyperparathyroidism of renal origin: Secondary | ICD-10-CM | POA: Diagnosis not present

## 2023-11-27 DIAGNOSIS — D631 Anemia in chronic kidney disease: Secondary | ICD-10-CM | POA: Diagnosis not present

## 2023-11-27 DIAGNOSIS — Z992 Dependence on renal dialysis: Secondary | ICD-10-CM | POA: Diagnosis not present

## 2023-11-27 DIAGNOSIS — N186 End stage renal disease: Secondary | ICD-10-CM | POA: Diagnosis not present

## 2023-11-27 DIAGNOSIS — D509 Iron deficiency anemia, unspecified: Secondary | ICD-10-CM | POA: Diagnosis not present

## 2023-11-29 DIAGNOSIS — N186 End stage renal disease: Secondary | ICD-10-CM | POA: Diagnosis not present

## 2023-11-29 DIAGNOSIS — D509 Iron deficiency anemia, unspecified: Secondary | ICD-10-CM | POA: Diagnosis not present

## 2023-11-29 DIAGNOSIS — D631 Anemia in chronic kidney disease: Secondary | ICD-10-CM | POA: Diagnosis not present

## 2023-11-29 DIAGNOSIS — N2581 Secondary hyperparathyroidism of renal origin: Secondary | ICD-10-CM | POA: Diagnosis not present

## 2023-11-29 DIAGNOSIS — Z992 Dependence on renal dialysis: Secondary | ICD-10-CM | POA: Diagnosis not present

## 2023-12-02 DIAGNOSIS — N2581 Secondary hyperparathyroidism of renal origin: Secondary | ICD-10-CM | POA: Diagnosis not present

## 2023-12-02 DIAGNOSIS — N186 End stage renal disease: Secondary | ICD-10-CM | POA: Diagnosis not present

## 2023-12-02 DIAGNOSIS — D509 Iron deficiency anemia, unspecified: Secondary | ICD-10-CM | POA: Diagnosis not present

## 2023-12-02 DIAGNOSIS — D631 Anemia in chronic kidney disease: Secondary | ICD-10-CM | POA: Diagnosis not present

## 2023-12-02 DIAGNOSIS — Z992 Dependence on renal dialysis: Secondary | ICD-10-CM | POA: Diagnosis not present

## 2023-12-03 DIAGNOSIS — L89610 Pressure ulcer of right heel, unstageable: Secondary | ICD-10-CM | POA: Diagnosis not present

## 2023-12-03 DIAGNOSIS — L089 Local infection of the skin and subcutaneous tissue, unspecified: Secondary | ICD-10-CM | POA: Diagnosis not present

## 2023-12-03 DIAGNOSIS — L89893 Pressure ulcer of other site, stage 3: Secondary | ICD-10-CM | POA: Diagnosis not present

## 2023-12-03 DIAGNOSIS — L723 Sebaceous cyst: Secondary | ICD-10-CM | POA: Diagnosis not present

## 2023-12-03 DIAGNOSIS — L89613 Pressure ulcer of right heel, stage 3: Secondary | ICD-10-CM | POA: Diagnosis not present

## 2023-12-04 DIAGNOSIS — D631 Anemia in chronic kidney disease: Secondary | ICD-10-CM | POA: Diagnosis not present

## 2023-12-04 DIAGNOSIS — D509 Iron deficiency anemia, unspecified: Secondary | ICD-10-CM | POA: Diagnosis not present

## 2023-12-04 DIAGNOSIS — N186 End stage renal disease: Secondary | ICD-10-CM | POA: Diagnosis not present

## 2023-12-04 DIAGNOSIS — N2581 Secondary hyperparathyroidism of renal origin: Secondary | ICD-10-CM | POA: Diagnosis not present

## 2023-12-04 DIAGNOSIS — Z992 Dependence on renal dialysis: Secondary | ICD-10-CM | POA: Diagnosis not present

## 2023-12-05 DIAGNOSIS — K219 Gastro-esophageal reflux disease without esophagitis: Secondary | ICD-10-CM | POA: Diagnosis not present

## 2023-12-05 DIAGNOSIS — L89890 Pressure ulcer of other site, unstageable: Secondary | ICD-10-CM | POA: Diagnosis not present

## 2023-12-05 DIAGNOSIS — I69359 Hemiplegia and hemiparesis following cerebral infarction affecting unspecified side: Secondary | ICD-10-CM | POA: Diagnosis not present

## 2023-12-05 DIAGNOSIS — L723 Sebaceous cyst: Secondary | ICD-10-CM | POA: Diagnosis not present

## 2023-12-05 DIAGNOSIS — S72141D Displaced intertrochanteric fracture of right femur, subsequent encounter for closed fracture with routine healing: Secondary | ICD-10-CM | POA: Diagnosis not present

## 2023-12-05 DIAGNOSIS — F419 Anxiety disorder, unspecified: Secondary | ICD-10-CM | POA: Diagnosis not present

## 2023-12-05 DIAGNOSIS — Z86718 Personal history of other venous thrombosis and embolism: Secondary | ICD-10-CM | POA: Diagnosis not present

## 2023-12-05 DIAGNOSIS — L720 Epidermal cyst: Secondary | ICD-10-CM | POA: Diagnosis not present

## 2023-12-05 DIAGNOSIS — Z7901 Long term (current) use of anticoagulants: Secondary | ICD-10-CM | POA: Diagnosis not present

## 2023-12-05 DIAGNOSIS — Z79899 Other long term (current) drug therapy: Secondary | ICD-10-CM | POA: Diagnosis not present

## 2023-12-05 DIAGNOSIS — I129 Hypertensive chronic kidney disease with stage 1 through stage 4 chronic kidney disease, or unspecified chronic kidney disease: Secondary | ICD-10-CM | POA: Diagnosis not present

## 2023-12-05 DIAGNOSIS — E1122 Type 2 diabetes mellitus with diabetic chronic kidney disease: Secondary | ICD-10-CM | POA: Diagnosis not present

## 2023-12-05 DIAGNOSIS — Z992 Dependence on renal dialysis: Secondary | ICD-10-CM | POA: Diagnosis not present

## 2023-12-05 DIAGNOSIS — I10 Essential (primary) hypertension: Secondary | ICD-10-CM | POA: Diagnosis not present

## 2023-12-05 DIAGNOSIS — N186 End stage renal disease: Secondary | ICD-10-CM | POA: Diagnosis not present

## 2023-12-05 DIAGNOSIS — I120 Hypertensive chronic kidney disease with stage 5 chronic kidney disease or end stage renal disease: Secondary | ICD-10-CM | POA: Diagnosis not present

## 2023-12-05 DIAGNOSIS — M21371 Foot drop, right foot: Secondary | ICD-10-CM | POA: Diagnosis not present

## 2023-12-05 DIAGNOSIS — L089 Local infection of the skin and subcutaneous tissue, unspecified: Secondary | ICD-10-CM | POA: Diagnosis not present

## 2023-12-05 DIAGNOSIS — E1142 Type 2 diabetes mellitus with diabetic polyneuropathy: Secondary | ICD-10-CM | POA: Diagnosis not present

## 2023-12-05 DIAGNOSIS — E039 Hypothyroidism, unspecified: Secondary | ICD-10-CM | POA: Diagnosis not present

## 2023-12-05 DIAGNOSIS — Z993 Dependence on wheelchair: Secondary | ICD-10-CM | POA: Diagnosis not present

## 2023-12-05 DIAGNOSIS — L89610 Pressure ulcer of right heel, unstageable: Secondary | ICD-10-CM | POA: Diagnosis not present

## 2023-12-05 DIAGNOSIS — Z87891 Personal history of nicotine dependence: Secondary | ICD-10-CM | POA: Diagnosis not present

## 2023-12-06 DIAGNOSIS — N186 End stage renal disease: Secondary | ICD-10-CM | POA: Diagnosis not present

## 2023-12-06 DIAGNOSIS — N2581 Secondary hyperparathyroidism of renal origin: Secondary | ICD-10-CM | POA: Diagnosis not present

## 2023-12-06 DIAGNOSIS — D631 Anemia in chronic kidney disease: Secondary | ICD-10-CM | POA: Diagnosis not present

## 2023-12-06 DIAGNOSIS — Z992 Dependence on renal dialysis: Secondary | ICD-10-CM | POA: Diagnosis not present

## 2023-12-06 DIAGNOSIS — D509 Iron deficiency anemia, unspecified: Secondary | ICD-10-CM | POA: Diagnosis not present

## 2023-12-07 DIAGNOSIS — L89890 Pressure ulcer of other site, unstageable: Secondary | ICD-10-CM | POA: Diagnosis not present

## 2023-12-07 DIAGNOSIS — N186 End stage renal disease: Secondary | ICD-10-CM | POA: Diagnosis not present

## 2023-12-07 DIAGNOSIS — S72141D Displaced intertrochanteric fracture of right femur, subsequent encounter for closed fracture with routine healing: Secondary | ICD-10-CM | POA: Diagnosis not present

## 2023-12-07 DIAGNOSIS — I120 Hypertensive chronic kidney disease with stage 5 chronic kidney disease or end stage renal disease: Secondary | ICD-10-CM | POA: Diagnosis not present

## 2023-12-07 DIAGNOSIS — L89610 Pressure ulcer of right heel, unstageable: Secondary | ICD-10-CM | POA: Diagnosis not present

## 2023-12-07 DIAGNOSIS — E1122 Type 2 diabetes mellitus with diabetic chronic kidney disease: Secondary | ICD-10-CM | POA: Diagnosis not present

## 2023-12-09 DIAGNOSIS — N186 End stage renal disease: Secondary | ICD-10-CM | POA: Diagnosis not present

## 2023-12-09 DIAGNOSIS — D509 Iron deficiency anemia, unspecified: Secondary | ICD-10-CM | POA: Diagnosis not present

## 2023-12-09 DIAGNOSIS — D631 Anemia in chronic kidney disease: Secondary | ICD-10-CM | POA: Diagnosis not present

## 2023-12-09 DIAGNOSIS — N2581 Secondary hyperparathyroidism of renal origin: Secondary | ICD-10-CM | POA: Diagnosis not present

## 2023-12-09 DIAGNOSIS — Z992 Dependence on renal dialysis: Secondary | ICD-10-CM | POA: Diagnosis not present

## 2023-12-10 DIAGNOSIS — E1169 Type 2 diabetes mellitus with other specified complication: Secondary | ICD-10-CM | POA: Diagnosis not present

## 2023-12-10 DIAGNOSIS — N186 End stage renal disease: Secondary | ICD-10-CM | POA: Diagnosis not present

## 2023-12-10 DIAGNOSIS — Z7901 Long term (current) use of anticoagulants: Secondary | ICD-10-CM | POA: Diagnosis not present

## 2023-12-10 DIAGNOSIS — K7460 Unspecified cirrhosis of liver: Secondary | ICD-10-CM | POA: Diagnosis not present

## 2023-12-10 DIAGNOSIS — I120 Hypertensive chronic kidney disease with stage 5 chronic kidney disease or end stage renal disease: Secondary | ICD-10-CM | POA: Diagnosis not present

## 2023-12-10 DIAGNOSIS — I69359 Hemiplegia and hemiparesis following cerebral infarction affecting unspecified side: Secondary | ICD-10-CM | POA: Diagnosis not present

## 2023-12-10 DIAGNOSIS — L89610 Pressure ulcer of right heel, unstageable: Secondary | ICD-10-CM | POA: Diagnosis not present

## 2023-12-10 DIAGNOSIS — Z992 Dependence on renal dialysis: Secondary | ICD-10-CM | POA: Diagnosis not present

## 2023-12-10 DIAGNOSIS — E785 Hyperlipidemia, unspecified: Secondary | ICD-10-CM | POA: Diagnosis not present

## 2023-12-10 DIAGNOSIS — E1142 Type 2 diabetes mellitus with diabetic polyneuropathy: Secondary | ICD-10-CM | POA: Diagnosis not present

## 2023-12-10 DIAGNOSIS — E1122 Type 2 diabetes mellitus with diabetic chronic kidney disease: Secondary | ICD-10-CM | POA: Diagnosis not present

## 2023-12-10 DIAGNOSIS — R1310 Dysphagia, unspecified: Secondary | ICD-10-CM | POA: Diagnosis not present

## 2023-12-10 DIAGNOSIS — L89890 Pressure ulcer of other site, unstageable: Secondary | ICD-10-CM | POA: Diagnosis not present

## 2023-12-10 DIAGNOSIS — Z604 Social exclusion and rejection: Secondary | ICD-10-CM | POA: Diagnosis not present

## 2023-12-10 DIAGNOSIS — S72141D Displaced intertrochanteric fracture of right femur, subsequent encounter for closed fracture with routine healing: Secondary | ICD-10-CM | POA: Diagnosis not present

## 2023-12-10 DIAGNOSIS — Z556 Problems related to health literacy: Secondary | ICD-10-CM | POA: Diagnosis not present

## 2023-12-11 DIAGNOSIS — D509 Iron deficiency anemia, unspecified: Secondary | ICD-10-CM | POA: Diagnosis not present

## 2023-12-11 DIAGNOSIS — D631 Anemia in chronic kidney disease: Secondary | ICD-10-CM | POA: Diagnosis not present

## 2023-12-11 DIAGNOSIS — Z992 Dependence on renal dialysis: Secondary | ICD-10-CM | POA: Diagnosis not present

## 2023-12-11 DIAGNOSIS — N186 End stage renal disease: Secondary | ICD-10-CM | POA: Diagnosis not present

## 2023-12-11 DIAGNOSIS — N2581 Secondary hyperparathyroidism of renal origin: Secondary | ICD-10-CM | POA: Diagnosis not present

## 2023-12-12 DIAGNOSIS — S72141D Displaced intertrochanteric fracture of right femur, subsequent encounter for closed fracture with routine healing: Secondary | ICD-10-CM | POA: Diagnosis not present

## 2023-12-12 DIAGNOSIS — L89613 Pressure ulcer of right heel, stage 3: Secondary | ICD-10-CM | POA: Diagnosis not present

## 2023-12-12 DIAGNOSIS — I120 Hypertensive chronic kidney disease with stage 5 chronic kidney disease or end stage renal disease: Secondary | ICD-10-CM | POA: Diagnosis not present

## 2023-12-12 DIAGNOSIS — E1122 Type 2 diabetes mellitus with diabetic chronic kidney disease: Secondary | ICD-10-CM | POA: Diagnosis not present

## 2023-12-12 DIAGNOSIS — L89610 Pressure ulcer of right heel, unstageable: Secondary | ICD-10-CM | POA: Diagnosis not present

## 2023-12-12 DIAGNOSIS — L89890 Pressure ulcer of other site, unstageable: Secondary | ICD-10-CM | POA: Diagnosis not present

## 2023-12-12 DIAGNOSIS — N186 End stage renal disease: Secondary | ICD-10-CM | POA: Diagnosis not present

## 2023-12-12 DIAGNOSIS — L89893 Pressure ulcer of other site, stage 3: Secondary | ICD-10-CM | POA: Diagnosis not present

## 2023-12-13 DIAGNOSIS — D631 Anemia in chronic kidney disease: Secondary | ICD-10-CM | POA: Diagnosis not present

## 2023-12-13 DIAGNOSIS — Z992 Dependence on renal dialysis: Secondary | ICD-10-CM | POA: Diagnosis not present

## 2023-12-13 DIAGNOSIS — N186 End stage renal disease: Secondary | ICD-10-CM | POA: Diagnosis not present

## 2023-12-13 DIAGNOSIS — N2581 Secondary hyperparathyroidism of renal origin: Secondary | ICD-10-CM | POA: Diagnosis not present

## 2023-12-13 DIAGNOSIS — D509 Iron deficiency anemia, unspecified: Secondary | ICD-10-CM | POA: Diagnosis not present

## 2023-12-16 DIAGNOSIS — N2581 Secondary hyperparathyroidism of renal origin: Secondary | ICD-10-CM | POA: Diagnosis not present

## 2023-12-16 DIAGNOSIS — N186 End stage renal disease: Secondary | ICD-10-CM | POA: Diagnosis not present

## 2023-12-16 DIAGNOSIS — Z992 Dependence on renal dialysis: Secondary | ICD-10-CM | POA: Diagnosis not present

## 2023-12-16 DIAGNOSIS — D509 Iron deficiency anemia, unspecified: Secondary | ICD-10-CM | POA: Diagnosis not present

## 2023-12-16 DIAGNOSIS — D631 Anemia in chronic kidney disease: Secondary | ICD-10-CM | POA: Diagnosis not present

## 2023-12-17 DIAGNOSIS — L89890 Pressure ulcer of other site, unstageable: Secondary | ICD-10-CM | POA: Diagnosis not present

## 2023-12-17 DIAGNOSIS — I120 Hypertensive chronic kidney disease with stage 5 chronic kidney disease or end stage renal disease: Secondary | ICD-10-CM | POA: Diagnosis not present

## 2023-12-17 DIAGNOSIS — L89610 Pressure ulcer of right heel, unstageable: Secondary | ICD-10-CM | POA: Diagnosis not present

## 2023-12-17 DIAGNOSIS — S72141D Displaced intertrochanteric fracture of right femur, subsequent encounter for closed fracture with routine healing: Secondary | ICD-10-CM | POA: Diagnosis not present

## 2023-12-17 DIAGNOSIS — N186 End stage renal disease: Secondary | ICD-10-CM | POA: Diagnosis not present

## 2023-12-17 DIAGNOSIS — E1122 Type 2 diabetes mellitus with diabetic chronic kidney disease: Secondary | ICD-10-CM | POA: Diagnosis not present

## 2023-12-18 DIAGNOSIS — D509 Iron deficiency anemia, unspecified: Secondary | ICD-10-CM | POA: Diagnosis not present

## 2023-12-18 DIAGNOSIS — N186 End stage renal disease: Secondary | ICD-10-CM | POA: Diagnosis not present

## 2023-12-18 DIAGNOSIS — N2581 Secondary hyperparathyroidism of renal origin: Secondary | ICD-10-CM | POA: Diagnosis not present

## 2023-12-18 DIAGNOSIS — D631 Anemia in chronic kidney disease: Secondary | ICD-10-CM | POA: Diagnosis not present

## 2023-12-18 DIAGNOSIS — Z992 Dependence on renal dialysis: Secondary | ICD-10-CM | POA: Diagnosis not present

## 2023-12-19 DIAGNOSIS — L89613 Pressure ulcer of right heel, stage 3: Secondary | ICD-10-CM | POA: Diagnosis not present

## 2023-12-19 DIAGNOSIS — L89610 Pressure ulcer of right heel, unstageable: Secondary | ICD-10-CM | POA: Diagnosis not present

## 2023-12-19 DIAGNOSIS — L89893 Pressure ulcer of other site, stage 3: Secondary | ICD-10-CM | POA: Diagnosis not present

## 2023-12-20 DIAGNOSIS — N2581 Secondary hyperparathyroidism of renal origin: Secondary | ICD-10-CM | POA: Diagnosis not present

## 2023-12-20 DIAGNOSIS — D631 Anemia in chronic kidney disease: Secondary | ICD-10-CM | POA: Diagnosis not present

## 2023-12-20 DIAGNOSIS — Z992 Dependence on renal dialysis: Secondary | ICD-10-CM | POA: Diagnosis not present

## 2023-12-20 DIAGNOSIS — N186 End stage renal disease: Secondary | ICD-10-CM | POA: Diagnosis not present

## 2023-12-20 DIAGNOSIS — D509 Iron deficiency anemia, unspecified: Secondary | ICD-10-CM | POA: Diagnosis not present

## 2023-12-21 DIAGNOSIS — E1122 Type 2 diabetes mellitus with diabetic chronic kidney disease: Secondary | ICD-10-CM | POA: Diagnosis not present

## 2023-12-21 DIAGNOSIS — L89890 Pressure ulcer of other site, unstageable: Secondary | ICD-10-CM | POA: Diagnosis not present

## 2023-12-21 DIAGNOSIS — N186 End stage renal disease: Secondary | ICD-10-CM | POA: Diagnosis not present

## 2023-12-21 DIAGNOSIS — S72141D Displaced intertrochanteric fracture of right femur, subsequent encounter for closed fracture with routine healing: Secondary | ICD-10-CM | POA: Diagnosis not present

## 2023-12-21 DIAGNOSIS — L89610 Pressure ulcer of right heel, unstageable: Secondary | ICD-10-CM | POA: Diagnosis not present

## 2023-12-21 DIAGNOSIS — I120 Hypertensive chronic kidney disease with stage 5 chronic kidney disease or end stage renal disease: Secondary | ICD-10-CM | POA: Diagnosis not present

## 2023-12-23 DIAGNOSIS — N186 End stage renal disease: Secondary | ICD-10-CM | POA: Diagnosis not present

## 2023-12-23 DIAGNOSIS — D631 Anemia in chronic kidney disease: Secondary | ICD-10-CM | POA: Diagnosis not present

## 2023-12-23 DIAGNOSIS — N2581 Secondary hyperparathyroidism of renal origin: Secondary | ICD-10-CM | POA: Diagnosis not present

## 2023-12-23 DIAGNOSIS — D509 Iron deficiency anemia, unspecified: Secondary | ICD-10-CM | POA: Diagnosis not present

## 2023-12-23 DIAGNOSIS — Z992 Dependence on renal dialysis: Secondary | ICD-10-CM | POA: Diagnosis not present

## 2023-12-24 DIAGNOSIS — L89890 Pressure ulcer of other site, unstageable: Secondary | ICD-10-CM | POA: Diagnosis not present

## 2023-12-24 DIAGNOSIS — N186 End stage renal disease: Secondary | ICD-10-CM | POA: Diagnosis not present

## 2023-12-24 DIAGNOSIS — I10 Essential (primary) hypertension: Secondary | ICD-10-CM | POA: Diagnosis not present

## 2023-12-24 DIAGNOSIS — S72141D Displaced intertrochanteric fracture of right femur, subsequent encounter for closed fracture with routine healing: Secondary | ICD-10-CM | POA: Diagnosis not present

## 2023-12-24 DIAGNOSIS — I120 Hypertensive chronic kidney disease with stage 5 chronic kidney disease or end stage renal disease: Secondary | ICD-10-CM | POA: Diagnosis not present

## 2023-12-24 DIAGNOSIS — E1122 Type 2 diabetes mellitus with diabetic chronic kidney disease: Secondary | ICD-10-CM | POA: Diagnosis not present

## 2023-12-24 DIAGNOSIS — L89610 Pressure ulcer of right heel, unstageable: Secondary | ICD-10-CM | POA: Diagnosis not present

## 2023-12-25 DIAGNOSIS — E1169 Type 2 diabetes mellitus with other specified complication: Secondary | ICD-10-CM | POA: Diagnosis not present

## 2023-12-25 DIAGNOSIS — Z23 Encounter for immunization: Secondary | ICD-10-CM | POA: Diagnosis not present

## 2023-12-25 DIAGNOSIS — D509 Iron deficiency anemia, unspecified: Secondary | ICD-10-CM | POA: Diagnosis not present

## 2023-12-25 DIAGNOSIS — N186 End stage renal disease: Secondary | ICD-10-CM | POA: Diagnosis not present

## 2023-12-25 DIAGNOSIS — E1122 Type 2 diabetes mellitus with diabetic chronic kidney disease: Secondary | ICD-10-CM | POA: Diagnosis not present

## 2023-12-25 DIAGNOSIS — N2581 Secondary hyperparathyroidism of renal origin: Secondary | ICD-10-CM | POA: Diagnosis not present

## 2023-12-25 DIAGNOSIS — D631 Anemia in chronic kidney disease: Secondary | ICD-10-CM | POA: Diagnosis not present

## 2023-12-25 DIAGNOSIS — Z992 Dependence on renal dialysis: Secondary | ICD-10-CM | POA: Diagnosis not present

## 2023-12-25 DIAGNOSIS — E039 Hypothyroidism, unspecified: Secondary | ICD-10-CM | POA: Diagnosis not present

## 2023-12-26 DIAGNOSIS — L89893 Pressure ulcer of other site, stage 3: Secondary | ICD-10-CM | POA: Diagnosis not present

## 2023-12-26 DIAGNOSIS — L89610 Pressure ulcer of right heel, unstageable: Secondary | ICD-10-CM | POA: Diagnosis not present

## 2023-12-26 DIAGNOSIS — L89613 Pressure ulcer of right heel, stage 3: Secondary | ICD-10-CM | POA: Diagnosis not present

## 2023-12-27 DIAGNOSIS — D631 Anemia in chronic kidney disease: Secondary | ICD-10-CM | POA: Diagnosis not present

## 2023-12-27 DIAGNOSIS — N186 End stage renal disease: Secondary | ICD-10-CM | POA: Diagnosis not present

## 2023-12-27 DIAGNOSIS — Z992 Dependence on renal dialysis: Secondary | ICD-10-CM | POA: Diagnosis not present

## 2023-12-27 DIAGNOSIS — Z23 Encounter for immunization: Secondary | ICD-10-CM | POA: Diagnosis not present

## 2023-12-27 DIAGNOSIS — D509 Iron deficiency anemia, unspecified: Secondary | ICD-10-CM | POA: Diagnosis not present

## 2023-12-30 DIAGNOSIS — Z992 Dependence on renal dialysis: Secondary | ICD-10-CM | POA: Diagnosis not present

## 2023-12-30 DIAGNOSIS — D631 Anemia in chronic kidney disease: Secondary | ICD-10-CM | POA: Diagnosis not present

## 2023-12-30 DIAGNOSIS — Z23 Encounter for immunization: Secondary | ICD-10-CM | POA: Diagnosis not present

## 2023-12-30 DIAGNOSIS — D509 Iron deficiency anemia, unspecified: Secondary | ICD-10-CM | POA: Diagnosis not present

## 2023-12-30 DIAGNOSIS — N186 End stage renal disease: Secondary | ICD-10-CM | POA: Diagnosis not present

## 2023-12-31 DIAGNOSIS — S72141D Displaced intertrochanteric fracture of right femur, subsequent encounter for closed fracture with routine healing: Secondary | ICD-10-CM | POA: Diagnosis not present

## 2023-12-31 DIAGNOSIS — L89610 Pressure ulcer of right heel, unstageable: Secondary | ICD-10-CM | POA: Diagnosis not present

## 2023-12-31 DIAGNOSIS — N186 End stage renal disease: Secondary | ICD-10-CM | POA: Diagnosis not present

## 2023-12-31 DIAGNOSIS — I120 Hypertensive chronic kidney disease with stage 5 chronic kidney disease or end stage renal disease: Secondary | ICD-10-CM | POA: Diagnosis not present

## 2023-12-31 DIAGNOSIS — L89890 Pressure ulcer of other site, unstageable: Secondary | ICD-10-CM | POA: Diagnosis not present

## 2023-12-31 DIAGNOSIS — E1122 Type 2 diabetes mellitus with diabetic chronic kidney disease: Secondary | ICD-10-CM | POA: Diagnosis not present

## 2024-01-01 DIAGNOSIS — Z23 Encounter for immunization: Secondary | ICD-10-CM | POA: Diagnosis not present

## 2024-01-01 DIAGNOSIS — Z992 Dependence on renal dialysis: Secondary | ICD-10-CM | POA: Diagnosis not present

## 2024-01-01 DIAGNOSIS — D509 Iron deficiency anemia, unspecified: Secondary | ICD-10-CM | POA: Diagnosis not present

## 2024-01-01 DIAGNOSIS — D631 Anemia in chronic kidney disease: Secondary | ICD-10-CM | POA: Diagnosis not present

## 2024-01-01 DIAGNOSIS — N186 End stage renal disease: Secondary | ICD-10-CM | POA: Diagnosis not present

## 2024-01-02 DIAGNOSIS — L89610 Pressure ulcer of right heel, unstageable: Secondary | ICD-10-CM | POA: Diagnosis not present

## 2024-01-02 DIAGNOSIS — L89890 Pressure ulcer of other site, unstageable: Secondary | ICD-10-CM | POA: Diagnosis not present

## 2024-01-02 DIAGNOSIS — E1122 Type 2 diabetes mellitus with diabetic chronic kidney disease: Secondary | ICD-10-CM | POA: Diagnosis not present

## 2024-01-02 DIAGNOSIS — N186 End stage renal disease: Secondary | ICD-10-CM | POA: Diagnosis not present

## 2024-01-02 DIAGNOSIS — E113391 Type 2 diabetes mellitus with moderate nonproliferative diabetic retinopathy without macular edema, right eye: Secondary | ICD-10-CM | POA: Diagnosis not present

## 2024-01-02 DIAGNOSIS — S72141D Displaced intertrochanteric fracture of right femur, subsequent encounter for closed fracture with routine healing: Secondary | ICD-10-CM | POA: Diagnosis not present

## 2024-01-02 DIAGNOSIS — I120 Hypertensive chronic kidney disease with stage 5 chronic kidney disease or end stage renal disease: Secondary | ICD-10-CM | POA: Diagnosis not present

## 2024-01-03 DIAGNOSIS — Z992 Dependence on renal dialysis: Secondary | ICD-10-CM | POA: Diagnosis not present

## 2024-01-03 DIAGNOSIS — D509 Iron deficiency anemia, unspecified: Secondary | ICD-10-CM | POA: Diagnosis not present

## 2024-01-03 DIAGNOSIS — D631 Anemia in chronic kidney disease: Secondary | ICD-10-CM | POA: Diagnosis not present

## 2024-01-03 DIAGNOSIS — N186 End stage renal disease: Secondary | ICD-10-CM | POA: Diagnosis not present

## 2024-01-03 DIAGNOSIS — Z23 Encounter for immunization: Secondary | ICD-10-CM | POA: Diagnosis not present

## 2024-01-06 DIAGNOSIS — Z992 Dependence on renal dialysis: Secondary | ICD-10-CM | POA: Diagnosis not present

## 2024-01-06 DIAGNOSIS — D509 Iron deficiency anemia, unspecified: Secondary | ICD-10-CM | POA: Diagnosis not present

## 2024-01-06 DIAGNOSIS — Z23 Encounter for immunization: Secondary | ICD-10-CM | POA: Diagnosis not present

## 2024-01-06 DIAGNOSIS — D631 Anemia in chronic kidney disease: Secondary | ICD-10-CM | POA: Diagnosis not present

## 2024-01-06 DIAGNOSIS — N186 End stage renal disease: Secondary | ICD-10-CM | POA: Diagnosis not present

## 2024-01-07 DIAGNOSIS — L89610 Pressure ulcer of right heel, unstageable: Secondary | ICD-10-CM | POA: Diagnosis not present

## 2024-01-07 DIAGNOSIS — E1122 Type 2 diabetes mellitus with diabetic chronic kidney disease: Secondary | ICD-10-CM | POA: Diagnosis not present

## 2024-01-07 DIAGNOSIS — N186 End stage renal disease: Secondary | ICD-10-CM | POA: Diagnosis not present

## 2024-01-07 DIAGNOSIS — L89890 Pressure ulcer of other site, unstageable: Secondary | ICD-10-CM | POA: Diagnosis not present

## 2024-01-07 DIAGNOSIS — S72141D Displaced intertrochanteric fracture of right femur, subsequent encounter for closed fracture with routine healing: Secondary | ICD-10-CM | POA: Diagnosis not present

## 2024-01-07 DIAGNOSIS — I120 Hypertensive chronic kidney disease with stage 5 chronic kidney disease or end stage renal disease: Secondary | ICD-10-CM | POA: Diagnosis not present

## 2024-01-08 DIAGNOSIS — D631 Anemia in chronic kidney disease: Secondary | ICD-10-CM | POA: Diagnosis not present

## 2024-01-08 DIAGNOSIS — N186 End stage renal disease: Secondary | ICD-10-CM | POA: Diagnosis not present

## 2024-01-08 DIAGNOSIS — Z992 Dependence on renal dialysis: Secondary | ICD-10-CM | POA: Diagnosis not present

## 2024-01-08 DIAGNOSIS — Z23 Encounter for immunization: Secondary | ICD-10-CM | POA: Diagnosis not present

## 2024-01-08 DIAGNOSIS — D509 Iron deficiency anemia, unspecified: Secondary | ICD-10-CM | POA: Diagnosis not present

## 2024-01-09 DIAGNOSIS — E1122 Type 2 diabetes mellitus with diabetic chronic kidney disease: Secondary | ICD-10-CM | POA: Diagnosis not present

## 2024-01-09 DIAGNOSIS — Z992 Dependence on renal dialysis: Secondary | ICD-10-CM | POA: Diagnosis not present

## 2024-01-09 DIAGNOSIS — Z556 Problems related to health literacy: Secondary | ICD-10-CM | POA: Diagnosis not present

## 2024-01-09 DIAGNOSIS — E785 Hyperlipidemia, unspecified: Secondary | ICD-10-CM | POA: Diagnosis not present

## 2024-01-09 DIAGNOSIS — E1142 Type 2 diabetes mellitus with diabetic polyneuropathy: Secondary | ICD-10-CM | POA: Diagnosis not present

## 2024-01-09 DIAGNOSIS — L89610 Pressure ulcer of right heel, unstageable: Secondary | ICD-10-CM | POA: Diagnosis not present

## 2024-01-09 DIAGNOSIS — E1169 Type 2 diabetes mellitus with other specified complication: Secondary | ICD-10-CM | POA: Diagnosis not present

## 2024-01-09 DIAGNOSIS — L89890 Pressure ulcer of other site, unstageable: Secondary | ICD-10-CM | POA: Diagnosis not present

## 2024-01-09 DIAGNOSIS — L89613 Pressure ulcer of right heel, stage 3: Secondary | ICD-10-CM | POA: Diagnosis not present

## 2024-01-09 DIAGNOSIS — N186 End stage renal disease: Secondary | ICD-10-CM | POA: Diagnosis not present

## 2024-01-09 DIAGNOSIS — I69359 Hemiplegia and hemiparesis following cerebral infarction affecting unspecified side: Secondary | ICD-10-CM | POA: Diagnosis not present

## 2024-01-09 DIAGNOSIS — I120 Hypertensive chronic kidney disease with stage 5 chronic kidney disease or end stage renal disease: Secondary | ICD-10-CM | POA: Diagnosis not present

## 2024-01-09 DIAGNOSIS — L89893 Pressure ulcer of other site, stage 3: Secondary | ICD-10-CM | POA: Diagnosis not present

## 2024-01-09 DIAGNOSIS — Z7901 Long term (current) use of anticoagulants: Secondary | ICD-10-CM | POA: Diagnosis not present

## 2024-01-09 DIAGNOSIS — R1310 Dysphagia, unspecified: Secondary | ICD-10-CM | POA: Diagnosis not present

## 2024-01-09 DIAGNOSIS — S72141D Displaced intertrochanteric fracture of right femur, subsequent encounter for closed fracture with routine healing: Secondary | ICD-10-CM | POA: Diagnosis not present

## 2024-01-09 DIAGNOSIS — Z604 Social exclusion and rejection: Secondary | ICD-10-CM | POA: Diagnosis not present

## 2024-01-09 DIAGNOSIS — K7460 Unspecified cirrhosis of liver: Secondary | ICD-10-CM | POA: Diagnosis not present

## 2024-01-10 DIAGNOSIS — D631 Anemia in chronic kidney disease: Secondary | ICD-10-CM | POA: Diagnosis not present

## 2024-01-10 DIAGNOSIS — N186 End stage renal disease: Secondary | ICD-10-CM | POA: Diagnosis not present

## 2024-01-10 DIAGNOSIS — D509 Iron deficiency anemia, unspecified: Secondary | ICD-10-CM | POA: Diagnosis not present

## 2024-01-10 DIAGNOSIS — Z23 Encounter for immunization: Secondary | ICD-10-CM | POA: Diagnosis not present

## 2024-01-10 DIAGNOSIS — Z992 Dependence on renal dialysis: Secondary | ICD-10-CM | POA: Diagnosis not present

## 2024-01-13 DIAGNOSIS — N186 End stage renal disease: Secondary | ICD-10-CM | POA: Diagnosis not present

## 2024-01-13 DIAGNOSIS — Z992 Dependence on renal dialysis: Secondary | ICD-10-CM | POA: Diagnosis not present

## 2024-01-13 DIAGNOSIS — Z23 Encounter for immunization: Secondary | ICD-10-CM | POA: Diagnosis not present

## 2024-01-13 DIAGNOSIS — D509 Iron deficiency anemia, unspecified: Secondary | ICD-10-CM | POA: Diagnosis not present

## 2024-01-13 DIAGNOSIS — D631 Anemia in chronic kidney disease: Secondary | ICD-10-CM | POA: Diagnosis not present

## 2024-01-14 DIAGNOSIS — L89890 Pressure ulcer of other site, unstageable: Secondary | ICD-10-CM | POA: Diagnosis not present

## 2024-01-14 DIAGNOSIS — L89610 Pressure ulcer of right heel, unstageable: Secondary | ICD-10-CM | POA: Diagnosis not present

## 2024-01-14 DIAGNOSIS — S72141D Displaced intertrochanteric fracture of right femur, subsequent encounter for closed fracture with routine healing: Secondary | ICD-10-CM | POA: Diagnosis not present

## 2024-01-14 DIAGNOSIS — N186 End stage renal disease: Secondary | ICD-10-CM | POA: Diagnosis not present

## 2024-01-14 DIAGNOSIS — E1122 Type 2 diabetes mellitus with diabetic chronic kidney disease: Secondary | ICD-10-CM | POA: Diagnosis not present

## 2024-01-14 DIAGNOSIS — I120 Hypertensive chronic kidney disease with stage 5 chronic kidney disease or end stage renal disease: Secondary | ICD-10-CM | POA: Diagnosis not present

## 2024-01-15 DIAGNOSIS — Z23 Encounter for immunization: Secondary | ICD-10-CM | POA: Diagnosis not present

## 2024-01-15 DIAGNOSIS — D631 Anemia in chronic kidney disease: Secondary | ICD-10-CM | POA: Diagnosis not present

## 2024-01-15 DIAGNOSIS — N186 End stage renal disease: Secondary | ICD-10-CM | POA: Diagnosis not present

## 2024-01-15 DIAGNOSIS — Z992 Dependence on renal dialysis: Secondary | ICD-10-CM | POA: Diagnosis not present

## 2024-01-15 DIAGNOSIS — D509 Iron deficiency anemia, unspecified: Secondary | ICD-10-CM | POA: Diagnosis not present

## 2024-01-16 DIAGNOSIS — L89613 Pressure ulcer of right heel, stage 3: Secondary | ICD-10-CM | POA: Diagnosis not present

## 2024-01-16 DIAGNOSIS — L89610 Pressure ulcer of right heel, unstageable: Secondary | ICD-10-CM | POA: Diagnosis not present

## 2024-01-16 DIAGNOSIS — L89890 Pressure ulcer of other site, unstageable: Secondary | ICD-10-CM | POA: Diagnosis not present

## 2024-01-16 DIAGNOSIS — L89893 Pressure ulcer of other site, stage 3: Secondary | ICD-10-CM | POA: Diagnosis not present

## 2024-01-16 DIAGNOSIS — E1122 Type 2 diabetes mellitus with diabetic chronic kidney disease: Secondary | ICD-10-CM | POA: Diagnosis not present

## 2024-01-16 DIAGNOSIS — S72141D Displaced intertrochanteric fracture of right femur, subsequent encounter for closed fracture with routine healing: Secondary | ICD-10-CM | POA: Diagnosis not present

## 2024-01-16 DIAGNOSIS — E113591 Type 2 diabetes mellitus with proliferative diabetic retinopathy without macular edema, right eye: Secondary | ICD-10-CM | POA: Diagnosis not present

## 2024-01-16 DIAGNOSIS — I120 Hypertensive chronic kidney disease with stage 5 chronic kidney disease or end stage renal disease: Secondary | ICD-10-CM | POA: Diagnosis not present

## 2024-01-16 DIAGNOSIS — N186 End stage renal disease: Secondary | ICD-10-CM | POA: Diagnosis not present

## 2024-01-16 DIAGNOSIS — E113522 Type 2 diabetes mellitus with proliferative diabetic retinopathy with traction retinal detachment involving the macula, left eye: Secondary | ICD-10-CM | POA: Diagnosis not present

## 2024-01-17 DIAGNOSIS — D509 Iron deficiency anemia, unspecified: Secondary | ICD-10-CM | POA: Diagnosis not present

## 2024-01-17 DIAGNOSIS — Z23 Encounter for immunization: Secondary | ICD-10-CM | POA: Diagnosis not present

## 2024-01-17 DIAGNOSIS — Z992 Dependence on renal dialysis: Secondary | ICD-10-CM | POA: Diagnosis not present

## 2024-01-17 DIAGNOSIS — D631 Anemia in chronic kidney disease: Secondary | ICD-10-CM | POA: Diagnosis not present

## 2024-01-17 DIAGNOSIS — N186 End stage renal disease: Secondary | ICD-10-CM | POA: Diagnosis not present

## 2024-01-20 DIAGNOSIS — D631 Anemia in chronic kidney disease: Secondary | ICD-10-CM | POA: Diagnosis not present

## 2024-01-20 DIAGNOSIS — Z23 Encounter for immunization: Secondary | ICD-10-CM | POA: Diagnosis not present

## 2024-01-20 DIAGNOSIS — D509 Iron deficiency anemia, unspecified: Secondary | ICD-10-CM | POA: Diagnosis not present

## 2024-01-20 DIAGNOSIS — Z992 Dependence on renal dialysis: Secondary | ICD-10-CM | POA: Diagnosis not present

## 2024-01-20 DIAGNOSIS — N186 End stage renal disease: Secondary | ICD-10-CM | POA: Diagnosis not present

## 2024-01-21 DIAGNOSIS — E1122 Type 2 diabetes mellitus with diabetic chronic kidney disease: Secondary | ICD-10-CM | POA: Diagnosis not present

## 2024-01-21 DIAGNOSIS — I120 Hypertensive chronic kidney disease with stage 5 chronic kidney disease or end stage renal disease: Secondary | ICD-10-CM | POA: Diagnosis not present

## 2024-01-21 DIAGNOSIS — L89610 Pressure ulcer of right heel, unstageable: Secondary | ICD-10-CM | POA: Diagnosis not present

## 2024-01-21 DIAGNOSIS — S72141D Displaced intertrochanteric fracture of right femur, subsequent encounter for closed fracture with routine healing: Secondary | ICD-10-CM | POA: Diagnosis not present

## 2024-01-21 DIAGNOSIS — L89890 Pressure ulcer of other site, unstageable: Secondary | ICD-10-CM | POA: Diagnosis not present

## 2024-01-21 DIAGNOSIS — N186 End stage renal disease: Secondary | ICD-10-CM | POA: Diagnosis not present

## 2024-01-22 DIAGNOSIS — Z23 Encounter for immunization: Secondary | ICD-10-CM | POA: Diagnosis not present

## 2024-01-22 DIAGNOSIS — D631 Anemia in chronic kidney disease: Secondary | ICD-10-CM | POA: Diagnosis not present

## 2024-01-22 DIAGNOSIS — N186 End stage renal disease: Secondary | ICD-10-CM | POA: Diagnosis not present

## 2024-01-22 DIAGNOSIS — D509 Iron deficiency anemia, unspecified: Secondary | ICD-10-CM | POA: Diagnosis not present

## 2024-01-22 DIAGNOSIS — Z992 Dependence on renal dialysis: Secondary | ICD-10-CM | POA: Diagnosis not present

## 2024-01-23 DIAGNOSIS — N186 End stage renal disease: Secondary | ICD-10-CM | POA: Diagnosis not present

## 2024-01-23 DIAGNOSIS — L89890 Pressure ulcer of other site, unstageable: Secondary | ICD-10-CM | POA: Diagnosis not present

## 2024-01-23 DIAGNOSIS — I120 Hypertensive chronic kidney disease with stage 5 chronic kidney disease or end stage renal disease: Secondary | ICD-10-CM | POA: Diagnosis not present

## 2024-01-23 DIAGNOSIS — L89613 Pressure ulcer of right heel, stage 3: Secondary | ICD-10-CM | POA: Diagnosis not present

## 2024-01-23 DIAGNOSIS — L89893 Pressure ulcer of other site, stage 3: Secondary | ICD-10-CM | POA: Diagnosis not present

## 2024-01-23 DIAGNOSIS — L89610 Pressure ulcer of right heel, unstageable: Secondary | ICD-10-CM | POA: Diagnosis not present

## 2024-01-23 DIAGNOSIS — E1122 Type 2 diabetes mellitus with diabetic chronic kidney disease: Secondary | ICD-10-CM | POA: Diagnosis not present

## 2024-01-23 DIAGNOSIS — S72141D Displaced intertrochanteric fracture of right femur, subsequent encounter for closed fracture with routine healing: Secondary | ICD-10-CM | POA: Diagnosis not present

## 2024-01-24 DIAGNOSIS — N186 End stage renal disease: Secondary | ICD-10-CM | POA: Diagnosis not present

## 2024-01-24 DIAGNOSIS — I10 Essential (primary) hypertension: Secondary | ICD-10-CM | POA: Diagnosis not present

## 2024-01-24 DIAGNOSIS — Z992 Dependence on renal dialysis: Secondary | ICD-10-CM | POA: Diagnosis not present

## 2024-01-24 DIAGNOSIS — Z23 Encounter for immunization: Secondary | ICD-10-CM | POA: Diagnosis not present

## 2024-01-24 DIAGNOSIS — D509 Iron deficiency anemia, unspecified: Secondary | ICD-10-CM | POA: Diagnosis not present

## 2024-01-24 DIAGNOSIS — D631 Anemia in chronic kidney disease: Secondary | ICD-10-CM | POA: Diagnosis not present

## 2024-01-24 DIAGNOSIS — E1169 Type 2 diabetes mellitus with other specified complication: Secondary | ICD-10-CM | POA: Diagnosis not present

## 2024-01-25 DIAGNOSIS — N186 End stage renal disease: Secondary | ICD-10-CM | POA: Diagnosis not present

## 2024-01-25 DIAGNOSIS — E1169 Type 2 diabetes mellitus with other specified complication: Secondary | ICD-10-CM | POA: Diagnosis not present

## 2024-01-25 DIAGNOSIS — E039 Hypothyroidism, unspecified: Secondary | ICD-10-CM | POA: Diagnosis not present

## 2024-01-25 DIAGNOSIS — E1122 Type 2 diabetes mellitus with diabetic chronic kidney disease: Secondary | ICD-10-CM | POA: Diagnosis not present

## 2024-01-25 DIAGNOSIS — Z992 Dependence on renal dialysis: Secondary | ICD-10-CM | POA: Diagnosis not present

## 2024-01-27 DIAGNOSIS — Z992 Dependence on renal dialysis: Secondary | ICD-10-CM | POA: Diagnosis not present

## 2024-01-27 DIAGNOSIS — D509 Iron deficiency anemia, unspecified: Secondary | ICD-10-CM | POA: Diagnosis not present

## 2024-01-27 DIAGNOSIS — N186 End stage renal disease: Secondary | ICD-10-CM | POA: Diagnosis not present

## 2024-01-27 DIAGNOSIS — D631 Anemia in chronic kidney disease: Secondary | ICD-10-CM | POA: Diagnosis not present

## 2024-01-27 DIAGNOSIS — N2581 Secondary hyperparathyroidism of renal origin: Secondary | ICD-10-CM | POA: Diagnosis not present

## 2024-01-28 DIAGNOSIS — S72141D Displaced intertrochanteric fracture of right femur, subsequent encounter for closed fracture with routine healing: Secondary | ICD-10-CM | POA: Diagnosis not present

## 2024-01-28 DIAGNOSIS — E1122 Type 2 diabetes mellitus with diabetic chronic kidney disease: Secondary | ICD-10-CM | POA: Diagnosis not present

## 2024-01-28 DIAGNOSIS — I120 Hypertensive chronic kidney disease with stage 5 chronic kidney disease or end stage renal disease: Secondary | ICD-10-CM | POA: Diagnosis not present

## 2024-01-28 DIAGNOSIS — L89610 Pressure ulcer of right heel, unstageable: Secondary | ICD-10-CM | POA: Diagnosis not present

## 2024-01-28 DIAGNOSIS — N186 End stage renal disease: Secondary | ICD-10-CM | POA: Diagnosis not present

## 2024-01-28 DIAGNOSIS — L89890 Pressure ulcer of other site, unstageable: Secondary | ICD-10-CM | POA: Diagnosis not present

## 2024-01-29 DIAGNOSIS — N186 End stage renal disease: Secondary | ICD-10-CM | POA: Diagnosis not present

## 2024-01-29 DIAGNOSIS — R310 Gross hematuria: Secondary | ICD-10-CM | POA: Diagnosis not present

## 2024-01-29 DIAGNOSIS — N2581 Secondary hyperparathyroidism of renal origin: Secondary | ICD-10-CM | POA: Diagnosis not present

## 2024-01-29 DIAGNOSIS — D631 Anemia in chronic kidney disease: Secondary | ICD-10-CM | POA: Diagnosis not present

## 2024-01-29 DIAGNOSIS — Z992 Dependence on renal dialysis: Secondary | ICD-10-CM | POA: Diagnosis not present

## 2024-01-29 DIAGNOSIS — D509 Iron deficiency anemia, unspecified: Secondary | ICD-10-CM | POA: Diagnosis not present

## 2024-01-30 DIAGNOSIS — R310 Gross hematuria: Secondary | ICD-10-CM | POA: Diagnosis not present

## 2024-01-30 DIAGNOSIS — Z6822 Body mass index (BMI) 22.0-22.9, adult: Secondary | ICD-10-CM | POA: Diagnosis not present

## 2024-01-30 DIAGNOSIS — L89610 Pressure ulcer of right heel, unstageable: Secondary | ICD-10-CM | POA: Diagnosis not present

## 2024-01-31 DIAGNOSIS — Z992 Dependence on renal dialysis: Secondary | ICD-10-CM | POA: Diagnosis not present

## 2024-01-31 DIAGNOSIS — N186 End stage renal disease: Secondary | ICD-10-CM | POA: Diagnosis not present

## 2024-01-31 DIAGNOSIS — D631 Anemia in chronic kidney disease: Secondary | ICD-10-CM | POA: Diagnosis not present

## 2024-01-31 DIAGNOSIS — D509 Iron deficiency anemia, unspecified: Secondary | ICD-10-CM | POA: Diagnosis not present

## 2024-01-31 DIAGNOSIS — N2581 Secondary hyperparathyroidism of renal origin: Secondary | ICD-10-CM | POA: Diagnosis not present

## 2024-02-03 DIAGNOSIS — N2581 Secondary hyperparathyroidism of renal origin: Secondary | ICD-10-CM | POA: Diagnosis not present

## 2024-02-03 DIAGNOSIS — D631 Anemia in chronic kidney disease: Secondary | ICD-10-CM | POA: Diagnosis not present

## 2024-02-03 DIAGNOSIS — Z992 Dependence on renal dialysis: Secondary | ICD-10-CM | POA: Diagnosis not present

## 2024-02-03 DIAGNOSIS — N186 End stage renal disease: Secondary | ICD-10-CM | POA: Diagnosis not present

## 2024-02-03 DIAGNOSIS — D509 Iron deficiency anemia, unspecified: Secondary | ICD-10-CM | POA: Diagnosis not present

## 2024-02-04 DIAGNOSIS — N186 End stage renal disease: Secondary | ICD-10-CM | POA: Diagnosis not present

## 2024-02-04 DIAGNOSIS — L89890 Pressure ulcer of other site, unstageable: Secondary | ICD-10-CM | POA: Diagnosis not present

## 2024-02-04 DIAGNOSIS — L89610 Pressure ulcer of right heel, unstageable: Secondary | ICD-10-CM | POA: Diagnosis not present

## 2024-02-04 DIAGNOSIS — S72141D Displaced intertrochanteric fracture of right femur, subsequent encounter for closed fracture with routine healing: Secondary | ICD-10-CM | POA: Diagnosis not present

## 2024-02-04 DIAGNOSIS — E1122 Type 2 diabetes mellitus with diabetic chronic kidney disease: Secondary | ICD-10-CM | POA: Diagnosis not present

## 2024-02-04 DIAGNOSIS — I120 Hypertensive chronic kidney disease with stage 5 chronic kidney disease or end stage renal disease: Secondary | ICD-10-CM | POA: Diagnosis not present

## 2024-02-05 DIAGNOSIS — D509 Iron deficiency anemia, unspecified: Secondary | ICD-10-CM | POA: Diagnosis not present

## 2024-02-05 DIAGNOSIS — N186 End stage renal disease: Secondary | ICD-10-CM | POA: Diagnosis not present

## 2024-02-05 DIAGNOSIS — N2581 Secondary hyperparathyroidism of renal origin: Secondary | ICD-10-CM | POA: Diagnosis not present

## 2024-02-05 DIAGNOSIS — D631 Anemia in chronic kidney disease: Secondary | ICD-10-CM | POA: Diagnosis not present

## 2024-02-05 DIAGNOSIS — E1122 Type 2 diabetes mellitus with diabetic chronic kidney disease: Secondary | ICD-10-CM | POA: Diagnosis not present

## 2024-02-05 DIAGNOSIS — Z992 Dependence on renal dialysis: Secondary | ICD-10-CM | POA: Diagnosis not present

## 2024-02-06 DIAGNOSIS — L89610 Pressure ulcer of right heel, unstageable: Secondary | ICD-10-CM | POA: Diagnosis not present

## 2024-02-07 DIAGNOSIS — D509 Iron deficiency anemia, unspecified: Secondary | ICD-10-CM | POA: Diagnosis not present

## 2024-02-07 DIAGNOSIS — D631 Anemia in chronic kidney disease: Secondary | ICD-10-CM | POA: Diagnosis not present

## 2024-02-07 DIAGNOSIS — N186 End stage renal disease: Secondary | ICD-10-CM | POA: Diagnosis not present

## 2024-02-07 DIAGNOSIS — N2581 Secondary hyperparathyroidism of renal origin: Secondary | ICD-10-CM | POA: Diagnosis not present

## 2024-02-07 DIAGNOSIS — Z992 Dependence on renal dialysis: Secondary | ICD-10-CM | POA: Diagnosis not present

## 2024-02-10 DIAGNOSIS — N186 End stage renal disease: Secondary | ICD-10-CM | POA: Diagnosis not present

## 2024-02-10 DIAGNOSIS — N2581 Secondary hyperparathyroidism of renal origin: Secondary | ICD-10-CM | POA: Diagnosis not present

## 2024-02-10 DIAGNOSIS — D631 Anemia in chronic kidney disease: Secondary | ICD-10-CM | POA: Diagnosis not present

## 2024-02-10 DIAGNOSIS — D509 Iron deficiency anemia, unspecified: Secondary | ICD-10-CM | POA: Diagnosis not present

## 2024-02-10 DIAGNOSIS — Z992 Dependence on renal dialysis: Secondary | ICD-10-CM | POA: Diagnosis not present

## 2024-02-12 DIAGNOSIS — D509 Iron deficiency anemia, unspecified: Secondary | ICD-10-CM | POA: Diagnosis not present

## 2024-02-12 DIAGNOSIS — Z992 Dependence on renal dialysis: Secondary | ICD-10-CM | POA: Diagnosis not present

## 2024-02-12 DIAGNOSIS — N2581 Secondary hyperparathyroidism of renal origin: Secondary | ICD-10-CM | POA: Diagnosis not present

## 2024-02-12 DIAGNOSIS — N186 End stage renal disease: Secondary | ICD-10-CM | POA: Diagnosis not present

## 2024-02-12 DIAGNOSIS — D631 Anemia in chronic kidney disease: Secondary | ICD-10-CM | POA: Diagnosis not present

## 2024-02-13 DIAGNOSIS — L89610 Pressure ulcer of right heel, unstageable: Secondary | ICD-10-CM | POA: Diagnosis not present

## 2024-02-14 DIAGNOSIS — D631 Anemia in chronic kidney disease: Secondary | ICD-10-CM | POA: Diagnosis not present

## 2024-02-14 DIAGNOSIS — N186 End stage renal disease: Secondary | ICD-10-CM | POA: Diagnosis not present

## 2024-02-14 DIAGNOSIS — N2581 Secondary hyperparathyroidism of renal origin: Secondary | ICD-10-CM | POA: Diagnosis not present

## 2024-02-14 DIAGNOSIS — Z992 Dependence on renal dialysis: Secondary | ICD-10-CM | POA: Diagnosis not present

## 2024-02-14 DIAGNOSIS — D509 Iron deficiency anemia, unspecified: Secondary | ICD-10-CM | POA: Diagnosis not present

## 2024-02-16 DIAGNOSIS — D509 Iron deficiency anemia, unspecified: Secondary | ICD-10-CM | POA: Diagnosis not present

## 2024-02-16 DIAGNOSIS — D631 Anemia in chronic kidney disease: Secondary | ICD-10-CM | POA: Diagnosis not present

## 2024-02-16 DIAGNOSIS — N186 End stage renal disease: Secondary | ICD-10-CM | POA: Diagnosis not present

## 2024-02-16 DIAGNOSIS — N2581 Secondary hyperparathyroidism of renal origin: Secondary | ICD-10-CM | POA: Diagnosis not present

## 2024-02-16 DIAGNOSIS — Z992 Dependence on renal dialysis: Secondary | ICD-10-CM | POA: Diagnosis not present

## 2024-02-18 DIAGNOSIS — D509 Iron deficiency anemia, unspecified: Secondary | ICD-10-CM | POA: Diagnosis not present

## 2024-02-18 DIAGNOSIS — N186 End stage renal disease: Secondary | ICD-10-CM | POA: Diagnosis not present

## 2024-02-18 DIAGNOSIS — D631 Anemia in chronic kidney disease: Secondary | ICD-10-CM | POA: Diagnosis not present

## 2024-02-18 DIAGNOSIS — Z992 Dependence on renal dialysis: Secondary | ICD-10-CM | POA: Diagnosis not present

## 2024-02-18 DIAGNOSIS — N2581 Secondary hyperparathyroidism of renal origin: Secondary | ICD-10-CM | POA: Diagnosis not present

## 2024-02-21 DIAGNOSIS — D631 Anemia in chronic kidney disease: Secondary | ICD-10-CM | POA: Diagnosis not present

## 2024-02-21 DIAGNOSIS — N186 End stage renal disease: Secondary | ICD-10-CM | POA: Diagnosis not present

## 2024-02-21 DIAGNOSIS — D509 Iron deficiency anemia, unspecified: Secondary | ICD-10-CM | POA: Diagnosis not present

## 2024-02-21 DIAGNOSIS — Z992 Dependence on renal dialysis: Secondary | ICD-10-CM | POA: Diagnosis not present

## 2024-02-21 DIAGNOSIS — N2581 Secondary hyperparathyroidism of renal origin: Secondary | ICD-10-CM | POA: Diagnosis not present

## 2024-02-24 DIAGNOSIS — E1169 Type 2 diabetes mellitus with other specified complication: Secondary | ICD-10-CM | POA: Diagnosis not present

## 2024-02-24 DIAGNOSIS — E039 Hypothyroidism, unspecified: Secondary | ICD-10-CM | POA: Diagnosis not present

## 2024-05-01 ENCOUNTER — Telehealth: Payer: Self-pay

## 2024-05-01 NOTE — Progress Notes (Unsigned)
 Complex Care Management Note  Care Guide Note 05/01/2024 Name: Francisco Baldwin MRN: [id number] DOB: Apr 11, 1955  Francisco Baldwin is a 69 y.o. year old male who sees Melonie Colonel, Mikel HERO, MD for primary care. I reached out to Francisco Baldwin by phone today to offer complex care management services.  [name] was given information about Complex Care Management services today including:   The Complex Care Management services include support from the care team which includes your Nurse Care Manager, Clinical Social Worker, or Pharmacist.  The Complex Care Management team is here to help remove barriers to the health concerns and goals most important to you. Complex Care Management services are voluntary, and the patient may decline or stop services at any time by request to their care team member.   Complex Care Management Consent Status: Patient wishes to consider information provided and/or speak with a member of the care team before deciding to participate in complex care management services.   Follow up plan:  The care guide will reach out to the patient again over the next 7 days.  Encounter Outcome:  Patient Request to Call Back  Dreama [facility]  [facility], [facility] VBCI Assistant Direct Dial : [phone]  Fax: [phone]
